# Patient Record
Sex: Female | Born: 1935
Health system: Southern US, Community
[De-identification: ages and names within clinical notes are randomized; demographics above are authoritative.]

## PROBLEM LIST (undated history)

## (undated) DIAGNOSIS — F419 Anxiety disorder, unspecified: Secondary | ICD-10-CM

## (undated) DIAGNOSIS — M1990 Unspecified osteoarthritis, unspecified site: Secondary | ICD-10-CM

## (undated) DIAGNOSIS — J329 Chronic sinusitis, unspecified: Secondary | ICD-10-CM

## (undated) DIAGNOSIS — A0472 Enterocolitis due to Clostridium difficile, not specified as recurrent: Secondary | ICD-10-CM

## (undated) DIAGNOSIS — E785 Hyperlipidemia, unspecified: Secondary | ICD-10-CM

## (undated) DIAGNOSIS — B3781 Candidal esophagitis: Secondary | ICD-10-CM

## (undated) DIAGNOSIS — F329 Major depressive disorder, single episode, unspecified: Secondary | ICD-10-CM

## (undated) DIAGNOSIS — M48061 Spinal stenosis, lumbar region without neurogenic claudication: Secondary | ICD-10-CM

## (undated) DIAGNOSIS — F32A Depression, unspecified: Secondary | ICD-10-CM

## (undated) DIAGNOSIS — K219 Gastro-esophageal reflux disease without esophagitis: Secondary | ICD-10-CM

## (undated) DIAGNOSIS — I10 Essential (primary) hypertension: Secondary | ICD-10-CM

## (undated) DIAGNOSIS — M8580 Other specified disorders of bone density and structure, unspecified site: Secondary | ICD-10-CM

## (undated) DIAGNOSIS — M797 Fibromyalgia: Secondary | ICD-10-CM

## (undated) DIAGNOSIS — D126 Benign neoplasm of colon, unspecified: Secondary | ICD-10-CM

## (undated) DIAGNOSIS — M109 Gout, unspecified: Secondary | ICD-10-CM

## (undated) DIAGNOSIS — S62102A Fracture of unspecified carpal bone, left wrist, initial encounter for closed fracture: Secondary | ICD-10-CM

## (undated) DIAGNOSIS — K5790 Diverticulosis of intestine, part unspecified, without perforation or abscess without bleeding: Secondary | ICD-10-CM

## (undated) DIAGNOSIS — F410 Panic disorder [episodic paroxysmal anxiety] without agoraphobia: Secondary | ICD-10-CM

## (undated) DIAGNOSIS — R600 Localized edema: Secondary | ICD-10-CM

## (undated) DIAGNOSIS — N19 Unspecified kidney failure: Secondary | ICD-10-CM

## (undated) HISTORY — DX: Enterocolitis due to Clostridium difficile, not specified as recurrent: A04.72

## (undated) HISTORY — DX: Major depressive disorder, single episode, unspecified: F32.9

## (undated) HISTORY — DX: Fracture of unspecified carpal bone, left wrist, initial encounter for closed fracture: S62.102A

## (undated) HISTORY — PX: NASAL SINUS SURGERY: SHX719

## (undated) HISTORY — DX: Gout, unspecified: M10.9

## (undated) HISTORY — PX: OTHER SURGICAL HISTORY: SHX169

## (undated) HISTORY — DX: Unspecified osteoarthritis, unspecified site: M19.90

## (undated) HISTORY — PX: CHOANAL ADENIODECTOMY: SHX1340

## (undated) HISTORY — DX: Gastro-esophageal reflux disease without esophagitis: K21.9

## (undated) HISTORY — PX: TOTAL ABDOMINAL HYSTERECTOMY: SHX209

## (undated) HISTORY — PX: TONSILLECTOMY AND ADENOIDECTOMY: SUR1326

## (undated) HISTORY — PX: ADENOIDECTOMY: SUR15

## (undated) HISTORY — DX: Candidal esophagitis: B37.81

## (undated) HISTORY — PX: SHOULDER SURGERY: SHX246

## (undated) HISTORY — DX: Chronic sinusitis, unspecified: J32.9

## (undated) HISTORY — DX: Spinal stenosis, lumbar region without neurogenic claudication: M48.061

## (undated) HISTORY — DX: Depression, unspecified: F32.A

## (undated) HISTORY — PX: CARPAL TUNNEL RELEASE: SHX101

## (undated) HISTORY — DX: Unspecified kidney failure: N19

## (undated) HISTORY — PX: KNEE ARTHROSCOPY: SUR90

## (undated) HISTORY — DX: Diverticulosis of intestine, part unspecified, without perforation or abscess without bleeding: K57.90

## (undated) HISTORY — DX: Fibromyalgia: M79.7

## (undated) HISTORY — PX: CATARACT EXTRACTION, BILATERAL: SHX1313

## (undated) HISTORY — DX: Panic disorder (episodic paroxysmal anxiety): F41.0

## (undated) HISTORY — DX: Hyperlipidemia, unspecified: E78.5

## (undated) HISTORY — DX: Benign neoplasm of colon, unspecified: D12.6

## (undated) HISTORY — DX: Essential (primary) hypertension: I10

## (undated) HISTORY — DX: Localized edema: R60.0

---

## 1997-09-16 ENCOUNTER — Ambulatory Visit (HOSPITAL_COMMUNITY): Admission: RE | Admit: 1997-09-16 | Discharge: 1997-09-16 | Payer: Self-pay | Admitting: *Deleted

## 1997-10-03 ENCOUNTER — Ambulatory Visit (HOSPITAL_COMMUNITY): Admission: RE | Admit: 1997-10-03 | Discharge: 1997-10-03 | Payer: Self-pay | Admitting: *Deleted

## 1998-01-30 ENCOUNTER — Encounter: Payer: Self-pay | Admitting: Internal Medicine

## 1998-06-11 ENCOUNTER — Ambulatory Visit (HOSPITAL_COMMUNITY): Admission: RE | Admit: 1998-06-11 | Discharge: 1998-06-11 | Payer: Self-pay | Admitting: *Deleted

## 1998-06-29 ENCOUNTER — Ambulatory Visit (HOSPITAL_COMMUNITY): Admission: RE | Admit: 1998-06-29 | Discharge: 1998-06-29 | Payer: Self-pay | Admitting: *Deleted

## 1998-10-27 ENCOUNTER — Emergency Department (HOSPITAL_COMMUNITY): Admission: EM | Admit: 1998-10-27 | Discharge: 1998-10-28 | Payer: Self-pay | Admitting: Emergency Medicine

## 1998-10-27 ENCOUNTER — Encounter: Payer: Self-pay | Admitting: Emergency Medicine

## 1998-10-28 ENCOUNTER — Encounter: Payer: Self-pay | Admitting: Emergency Medicine

## 1999-05-18 ENCOUNTER — Encounter: Payer: Self-pay | Admitting: Otolaryngology

## 1999-05-19 ENCOUNTER — Encounter (INDEPENDENT_AMBULATORY_CARE_PROVIDER_SITE_OTHER): Payer: Self-pay | Admitting: Specialist

## 1999-05-19 ENCOUNTER — Ambulatory Visit (HOSPITAL_COMMUNITY): Admission: RE | Admit: 1999-05-19 | Discharge: 1999-05-19 | Payer: Self-pay | Admitting: Otolaryngology

## 1999-08-06 ENCOUNTER — Encounter: Payer: Self-pay | Admitting: Family Medicine

## 1999-08-06 ENCOUNTER — Ambulatory Visit (HOSPITAL_COMMUNITY): Admission: RE | Admit: 1999-08-06 | Discharge: 1999-08-06 | Payer: Self-pay | Admitting: Family Medicine

## 1999-11-08 ENCOUNTER — Ambulatory Visit (HOSPITAL_COMMUNITY): Admission: RE | Admit: 1999-11-08 | Discharge: 1999-11-08 | Payer: Self-pay | Admitting: Family Medicine

## 1999-12-17 ENCOUNTER — Encounter: Payer: Self-pay | Admitting: Pulmonary Disease

## 1999-12-17 ENCOUNTER — Ambulatory Visit (HOSPITAL_COMMUNITY): Admission: RE | Admit: 1999-12-17 | Discharge: 1999-12-17 | Payer: Self-pay | Admitting: Pulmonary Disease

## 2000-06-23 ENCOUNTER — Encounter: Admission: RE | Admit: 2000-06-23 | Discharge: 2000-06-23 | Payer: Self-pay | Admitting: Otolaryngology

## 2000-06-23 ENCOUNTER — Encounter: Payer: Self-pay | Admitting: Otolaryngology

## 2000-08-01 HISTORY — PX: TOTAL SHOULDER REPLACEMENT: SUR1217

## 2000-08-08 ENCOUNTER — Other Ambulatory Visit: Admission: RE | Admit: 2000-08-08 | Discharge: 2000-08-08 | Payer: Self-pay | Admitting: *Deleted

## 2000-08-10 ENCOUNTER — Encounter: Payer: Self-pay | Admitting: Otolaryngology

## 2000-08-10 ENCOUNTER — Encounter: Admission: RE | Admit: 2000-08-10 | Discharge: 2000-08-10 | Payer: Self-pay | Admitting: Otolaryngology

## 2000-08-11 ENCOUNTER — Ambulatory Visit (HOSPITAL_BASED_OUTPATIENT_CLINIC_OR_DEPARTMENT_OTHER): Admission: RE | Admit: 2000-08-11 | Discharge: 2000-08-11 | Payer: Self-pay | Admitting: Otolaryngology

## 2000-08-26 ENCOUNTER — Encounter: Payer: Self-pay | Admitting: Emergency Medicine

## 2000-08-26 ENCOUNTER — Emergency Department (HOSPITAL_COMMUNITY): Admission: EM | Admit: 2000-08-26 | Discharge: 2000-08-26 | Payer: Self-pay | Admitting: Internal Medicine

## 2000-09-27 ENCOUNTER — Encounter: Payer: Self-pay | Admitting: Family Medicine

## 2000-09-27 ENCOUNTER — Ambulatory Visit (HOSPITAL_COMMUNITY): Admission: RE | Admit: 2000-09-27 | Discharge: 2000-09-27 | Payer: Self-pay | Admitting: Family Medicine

## 2001-02-07 ENCOUNTER — Encounter: Admission: RE | Admit: 2001-02-07 | Discharge: 2001-05-08 | Payer: Self-pay | Admitting: Family Medicine

## 2001-05-18 ENCOUNTER — Encounter: Payer: Self-pay | Admitting: Emergency Medicine

## 2001-05-18 ENCOUNTER — Inpatient Hospital Stay (HOSPITAL_COMMUNITY): Admission: EM | Admit: 2001-05-18 | Discharge: 2001-05-20 | Payer: Self-pay | Admitting: Emergency Medicine

## 2001-05-18 ENCOUNTER — Encounter: Payer: Self-pay | Admitting: Specialist

## 2001-05-19 ENCOUNTER — Encounter: Payer: Self-pay | Admitting: Specialist

## 2001-08-20 ENCOUNTER — Encounter: Admission: RE | Admit: 2001-08-20 | Discharge: 2001-08-20 | Payer: Self-pay | Admitting: Otolaryngology

## 2001-08-20 ENCOUNTER — Encounter: Payer: Self-pay | Admitting: Otolaryngology

## 2001-12-18 ENCOUNTER — Encounter: Admission: RE | Admit: 2001-12-18 | Discharge: 2001-12-18 | Payer: Self-pay | Admitting: Specialist

## 2001-12-18 ENCOUNTER — Encounter: Payer: Self-pay | Admitting: Specialist

## 2002-04-26 ENCOUNTER — Encounter: Payer: Self-pay | Admitting: Family Medicine

## 2002-04-26 ENCOUNTER — Ambulatory Visit (HOSPITAL_COMMUNITY): Admission: RE | Admit: 2002-04-26 | Discharge: 2002-04-26 | Payer: Self-pay | Admitting: Family Medicine

## 2002-08-01 HISTORY — PX: TOTAL HIP ARTHROPLASTY: SHX124

## 2002-08-20 ENCOUNTER — Encounter: Payer: Self-pay | Admitting: Otolaryngology

## 2002-08-20 ENCOUNTER — Encounter: Admission: RE | Admit: 2002-08-20 | Discharge: 2002-08-20 | Payer: Self-pay | Admitting: Otolaryngology

## 2002-11-28 ENCOUNTER — Encounter: Admission: RE | Admit: 2002-11-28 | Discharge: 2003-01-10 | Payer: Self-pay | Admitting: Orthopaedic Surgery

## 2003-06-18 ENCOUNTER — Inpatient Hospital Stay (HOSPITAL_COMMUNITY): Admission: RE | Admit: 2003-06-18 | Discharge: 2003-06-23 | Payer: Self-pay | Admitting: Orthopedic Surgery

## 2004-04-30 ENCOUNTER — Ambulatory Visit (HOSPITAL_COMMUNITY): Admission: RE | Admit: 2004-04-30 | Discharge: 2004-04-30 | Payer: Self-pay | Admitting: Family Medicine

## 2004-05-27 ENCOUNTER — Encounter: Admission: RE | Admit: 2004-05-27 | Discharge: 2004-05-27 | Payer: Self-pay | Admitting: Orthopedic Surgery

## 2004-08-01 HISTORY — PX: TOTAL KNEE ARTHROPLASTY: SHX125

## 2004-08-10 ENCOUNTER — Ambulatory Visit: Payer: Self-pay | Admitting: Pulmonary Disease

## 2004-08-31 ENCOUNTER — Ambulatory Visit (HOSPITAL_COMMUNITY): Admission: RE | Admit: 2004-08-31 | Discharge: 2004-08-31 | Payer: Self-pay | Admitting: Pulmonary Disease

## 2004-08-31 ENCOUNTER — Ambulatory Visit: Payer: Self-pay | Admitting: Pulmonary Disease

## 2004-09-29 ENCOUNTER — Ambulatory Visit: Payer: Self-pay | Admitting: Pulmonary Disease

## 2004-10-28 ENCOUNTER — Ambulatory Visit: Payer: Self-pay | Admitting: Pulmonary Disease

## 2004-12-13 ENCOUNTER — Ambulatory Visit: Payer: Self-pay | Admitting: Pulmonary Disease

## 2005-01-07 ENCOUNTER — Ambulatory Visit: Payer: Self-pay | Admitting: Pulmonary Disease

## 2005-01-11 ENCOUNTER — Inpatient Hospital Stay (HOSPITAL_COMMUNITY): Admission: RE | Admit: 2005-01-11 | Discharge: 2005-01-15 | Payer: Self-pay | Admitting: Orthopedic Surgery

## 2005-08-30 ENCOUNTER — Encounter: Admission: RE | Admit: 2005-08-30 | Discharge: 2005-08-30 | Payer: Self-pay | Admitting: Orthopedic Surgery

## 2005-11-07 ENCOUNTER — Ambulatory Visit (HOSPITAL_BASED_OUTPATIENT_CLINIC_OR_DEPARTMENT_OTHER): Admission: RE | Admit: 2005-11-07 | Discharge: 2005-11-07 | Payer: Self-pay | Admitting: Orthopedic Surgery

## 2006-01-05 ENCOUNTER — Emergency Department (HOSPITAL_COMMUNITY): Admission: EM | Admit: 2006-01-05 | Discharge: 2006-01-06 | Payer: Self-pay | Admitting: Emergency Medicine

## 2006-03-07 ENCOUNTER — Ambulatory Visit (HOSPITAL_BASED_OUTPATIENT_CLINIC_OR_DEPARTMENT_OTHER): Admission: RE | Admit: 2006-03-07 | Discharge: 2006-03-07 | Payer: Self-pay | Admitting: Orthopedic Surgery

## 2006-03-14 ENCOUNTER — Emergency Department (HOSPITAL_COMMUNITY): Admission: EM | Admit: 2006-03-14 | Discharge: 2006-03-14 | Payer: Self-pay | Admitting: Emergency Medicine

## 2006-04-04 ENCOUNTER — Ambulatory Visit (HOSPITAL_COMMUNITY): Admission: RE | Admit: 2006-04-04 | Discharge: 2006-04-04 | Payer: Self-pay | Admitting: Family Medicine

## 2006-04-21 ENCOUNTER — Ambulatory Visit: Payer: Self-pay | Admitting: Internal Medicine

## 2006-05-11 ENCOUNTER — Ambulatory Visit: Payer: Self-pay | Admitting: Internal Medicine

## 2006-11-03 ENCOUNTER — Encounter: Admission: RE | Admit: 2006-11-03 | Discharge: 2006-11-03 | Payer: Self-pay | Admitting: Otolaryngology

## 2007-05-31 ENCOUNTER — Ambulatory Visit (HOSPITAL_COMMUNITY): Admission: RE | Admit: 2007-05-31 | Discharge: 2007-05-31 | Payer: Self-pay | Admitting: Family Medicine

## 2007-08-09 ENCOUNTER — Ambulatory Visit (HOSPITAL_BASED_OUTPATIENT_CLINIC_OR_DEPARTMENT_OTHER): Admission: RE | Admit: 2007-08-09 | Discharge: 2007-08-09 | Payer: Self-pay | Admitting: Orthopedic Surgery

## 2007-11-19 ENCOUNTER — Ambulatory Visit (HOSPITAL_COMMUNITY): Admission: RE | Admit: 2007-11-19 | Discharge: 2007-11-19 | Payer: Self-pay | Admitting: Family Medicine

## 2008-11-21 ENCOUNTER — Ambulatory Visit (HOSPITAL_COMMUNITY): Admission: RE | Admit: 2008-11-21 | Discharge: 2008-11-21 | Payer: Self-pay | Admitting: Family Medicine

## 2009-03-30 ENCOUNTER — Ambulatory Visit (HOSPITAL_COMMUNITY): Admission: RE | Admit: 2009-03-30 | Discharge: 2009-03-30 | Payer: Self-pay | Admitting: Family Medicine

## 2009-04-07 ENCOUNTER — Encounter: Admission: RE | Admit: 2009-04-07 | Discharge: 2009-04-07 | Payer: Self-pay | Admitting: Family Medicine

## 2009-12-02 ENCOUNTER — Ambulatory Visit (HOSPITAL_COMMUNITY): Admission: RE | Admit: 2009-12-02 | Discharge: 2009-12-02 | Payer: Self-pay | Admitting: Family Medicine

## 2010-03-17 ENCOUNTER — Encounter: Admission: RE | Admit: 2010-03-17 | Discharge: 2010-03-17 | Payer: Self-pay | Admitting: Orthopedic Surgery

## 2010-07-30 ENCOUNTER — Inpatient Hospital Stay (HOSPITAL_COMMUNITY)
Admission: RE | Admit: 2010-07-30 | Discharge: 2010-08-01 | Payer: Self-pay | Source: Home / Self Care | Attending: Orthopedic Surgery | Admitting: Orthopedic Surgery

## 2010-08-22 ENCOUNTER — Encounter: Payer: Self-pay | Admitting: Internal Medicine

## 2010-09-14 NOTE — Discharge Summary (Signed)
  [name], [name]              ACCOUNT NO.:  [id number]  MEDICAL RECORD NO.:  [id number]          PATIENT TYPE:  INP  LOCATION:  5024                         FACILITY:  [facility]  PHYSICIAN:  Almedia Balls. Ranell Patrick, M.D. DATE OF BIRTH:  1932-12-27  DATE OF ADMISSION:  07/30/2010 DATE OF DISCHARGE:  08/01/2010                              DISCHARGE SUMMARY   ADMISSION DIAGNOSIS:  Right shoulder painful hemiarthroplasty.  DISCHARGE DIAGNOSIS:  Right shoulder painful hemiarthroplasty status post reverse total shoulder arthroplasty.  BRIEF HISTORY:  The patient is a 77 year old female with worsening right shoulder pain secondary to a painful hemiarthroplasty along with a rotator cuff deficiency.  The patient was admitted for surgical management to decrease pain and increase function.  PROCEDURE:  The patient had a right total shoulder arthroplasty reverse revision by Dr. Malon Kindle on July 31, 2010.  Assistant was [employer], PA-C.  General anesthesia was used.  No complications.  HOSPITAL COURSE:  The patient was admitted on July 31, 2010 for the above-stated procedure which she tolerated well.  After adequate time in Postanesthesia Care Unit, she was transferred up to 5000 without any difficulty.  Postop day #1, the patient complained of moderate pain in that right shoulder but was able to work with physical therapy and occupational therapy.  Postop day #2, she continued to improve with physical therapy.  The patient will be discharged home once she is stable with physical therapy.  She was afebrile.  Labs were in acceptable limits and neurologically she was intact.  Wound was healing well.  DISCHARGE PLAN:  The patient will be discharged home on August 02, 2009. Her condition is stable.  Her diet is regular.  DISCHARGE MEDICATIONS: 1. Robaxin 500 mg p.o. q.6 h. 2. Percocet 5/325 one to two tablets q.4-6 p.r.n. pain.  FOLLOWUP:  The patient will follow back up with  Dr. Malon Kindle in 2 weeks.    Thomas B. Dixon, P.A.   ______________________________ Almedia Balls. Ranell Patrick, M.D.   TBD/MEDQ  D:  08/24/2010  T:  08/24/2010  Job:  [id number]  Electronically Signed by Standley Dakins P.A. on 08/26/2010 09:38:13 AM Electronically Signed by Malon Kindle  on 09/14/2010 11:21:07 AM

## 2010-09-24 ENCOUNTER — Telehealth: Payer: Self-pay | Admitting: Internal Medicine

## 2010-09-24 DIAGNOSIS — K219 Gastro-esophageal reflux disease without esophagitis: Secondary | ICD-10-CM | POA: Insufficient documentation

## 2010-09-24 DIAGNOSIS — R197 Diarrhea, unspecified: Secondary | ICD-10-CM | POA: Insufficient documentation

## 2010-09-24 DIAGNOSIS — R05 Cough: Secondary | ICD-10-CM | POA: Insufficient documentation

## 2010-09-27 ENCOUNTER — Encounter: Payer: Self-pay | Admitting: Internal Medicine

## 2010-09-27 ENCOUNTER — Other Ambulatory Visit: Payer: Medicare Other

## 2010-09-27 ENCOUNTER — Other Ambulatory Visit: Payer: Self-pay | Admitting: Internal Medicine

## 2010-09-27 ENCOUNTER — Ambulatory Visit (INDEPENDENT_AMBULATORY_CARE_PROVIDER_SITE_OTHER): Payer: Medicare Other | Admitting: Internal Medicine

## 2010-09-27 DIAGNOSIS — K5289 Other specified noninfective gastroenteritis and colitis: Secondary | ICD-10-CM

## 2010-09-27 DIAGNOSIS — K625 Hemorrhage of anus and rectum: Secondary | ICD-10-CM

## 2010-09-27 LAB — CONVERTED CEMR LAB: Tissue Transglutaminase Ab, IgA: 4.4 units (ref <20)

## 2010-09-27 LAB — RENAL FUNCTION PANEL
Albumin: 3.9 g/dL (ref 3.5–5.2)
BUN: 9 mg/dL (ref 6–23)
CO2: 32 mEq/L (ref 19–32)
Calcium: 9.7 mg/dL (ref 8.4–10.5)
Chloride: 94 mEq/L — ABNORMAL LOW (ref 96–112)
Creatinine, Ser: 1 mg/dL (ref 0.4–1.2)
GFR: 58.05 mL/min — ABNORMAL LOW (ref >60.00)
Glucose, Bld: 101 mg/dL — ABNORMAL HIGH (ref 70–99)
Phosphorus: 3.1 mg/dL (ref 2.3–4.6)
Potassium: 3.2 mEq/L — ABNORMAL LOW (ref 3.5–5.1)
Sodium: 137 mEq/L (ref 135–145)

## 2010-09-27 LAB — CBC WITH DIFFERENTIAL/PLATELET
Basophils Absolute: 0 10*3/uL (ref 0.0–0.1)
Basophils Relative: 0.3 % (ref 0.0–3.0)
Eosinophils Absolute: 0.2 10*3/uL (ref 0.0–0.7)
Eosinophils Relative: 1.3 % (ref 0.0–5.0)
HCT: 42.9 % (ref 36.0–46.0)
Hemoglobin: 14.5 g/dL (ref 12.0–15.0)
Lymphocytes Relative: 30.9 % (ref 12.0–46.0)
Lymphs Abs: 3.6 10*3/uL (ref 0.7–4.0)
MCHC: 33.8 g/dL (ref 30.0–36.0)
MCV: 93.9 fl (ref 78.0–100.0)
Monocytes Absolute: 0.6 10*3/uL (ref 0.1–1.0)
Monocytes Relative: 5.6 % (ref 3.0–12.0)
Neutro Abs: 7.2 10*3/uL (ref 1.4–7.7)
Neutrophils Relative %: 61.9 % (ref 43.0–77.0)
Platelets: 299 10*3/uL (ref 150.0–400.0)
RBC: 4.56 Mil/uL (ref 3.87–5.11)
RDW: 14.1 % (ref 11.5–14.6)
WBC: 11.6 10*3/uL — ABNORMAL HIGH (ref 4.5–10.5)

## 2010-09-27 LAB — TSH: TSH: 3.45 u[IU]/mL (ref 0.35–5.50)

## 2010-09-28 ENCOUNTER — Encounter: Payer: Self-pay | Admitting: Internal Medicine

## 2010-09-28 LAB — SEDIMENTATION RATE: Sed Rate: 16 mm/hr (ref 0–22)

## 2010-09-28 NOTE — Progress Notes (Signed)
Summary: Triage  Phone Note Call from Patient Call back at Home Phone [phone]   Caller: Patient Call For: Dr. Juanda Chance Reason for Call: Talk to Nurse Summary of Call: "bloody Diarrhea", nausea....requesting sooner appt. than next avail. Initial call taken by: Karna Christmas,  September 24, 2010 11:34 AM  Follow-up for Phone Call        Patient calling to schedule an appointment with Dr. Juanda Chance as per her PCP. She states she started having problems with rectal bleeding abut 2-3 months ago and she saw Dr. Hyacinth Meeker who told her it was hemorrhoids. 3-4 weeks ago, she had an episode of severe diarrhea with bleeding. She saw Dr. Zachery Dauer, who had her start on a BRAT diet. She got better until last week when she had another episode of diarrhea with bright red blood in stool. During the episode she had severe abdominal cramping. The diarrhea causes her rectal pain. She saw Dr. Uvaldo Rising at this time and per patient Dr. Uvaldo Rising spoke with Dr. Juanda Chance and patient was treated with antibiotics for "ischemic diverticulosis". She has completed the antibiotics 2 days ago. She is not having any diarrhea  at this time. Patient states she had her first bowel movement in 3-4 days which was formed this AM and she did see some bright red blood with the stool and on the tissue when she wiped.  Denies any abdominal pain or cramping at this time. Patient scheduled to see Dr. Juanda Chance on 09/27/10 at 3:00 PM. Patient is aware that if she has any black stools or maroon colored bleeding this weekend she is to go the ER.  Follow-up by: Jesse Fall RN,  September 24, 2010 12:06 PM  Additional Follow-up for Phone Call Additional follow up Details #1::        Above noted. Can workin with an extender if she needs to be seen sooner. Additional Follow-up by: Meryl Dare MD Clementeen Graham,  September 24, 2010 1:52 PM    Additional Follow-up for Phone Call Additional follow up Details #2::    reviewed and agree. Follow-up by: Hart Carwin  MD,  September 24, 2010 11:18 PM

## 2010-09-28 NOTE — Letter (Signed)
Summary: Gastroenterology  Gastroenterology   Imported By: Lamona Curl CMA (AAMA) 09/24/2010 16:44:33  _____________________________________________________________________  External Attachment:    Type:   Image     Comment:   External Document

## 2010-09-28 NOTE — Procedures (Signed)
Summary: Flex Sig  Patient Name: Carrie, [name] MRN:  Procedure Procedures: Flexible Proctosigmoidoscopy CPT: [phone].  Personnel: Endoscopist: Eitan Doubleday L. Juanda Chance, MD.  Exam Location: Exam performed in Outpatient Clinic. Outpatient  Patient Consent: Procedure, Alternatives, Risks and Benefits discussed, consent obtained, from patient.  Indications  Average Risk Screening Routine.  History  Current Medications: Patient is not currently taking Coumadin.  Pre-Exam Physical: Performed May 11, 2006. Entire physical exam was normal.  Comments: Pt. history reviewed/updated, physical exam performed prior to sedation?yes Exam Exam: Extent visualized: Splenic Flexure. Extent of exam: 80 cm. Colon retroflexion performed. Images taken. ASA Classification: II. Tolerance: good.  Colon Prep Used Mag Citrate for colon prep. Prep: fair, adequate exam.  Sedation Meds: Fentanyl 50 mcg. given IV. Versed 3.5 mg. given IV.  Findings - DIVERTICULOSIS: Sigmoid Colon. ICD9: Diverticulosis: 562.10. Comments: moderaterly severe diverticulosis of the sigmoid colon.  - NORMAL EXAM: Rectum.   Assessment Abnormal examination, see findings above.  Diagnoses: 562.10: Diverticulosis.   Comments: modrately severer diverticulosis of the sigmoid colon Events  Unplanned Intervention: No intervention was required.  Unplanned Events: There were no complications. Plans Patient Education: Patient given standard instructions for: Patient instructed to get routine colon every 5 years.  Comments: Barium enema  to vis the right colon Disposition: After procedure patient sent to recovery. After recovery patient sent home.   cc: Sigmund Hazel, MD This report was created from the original endoscopy report, which was reviewed and signed by the above listed endoscopist.

## 2010-09-28 NOTE — Progress Notes (Signed)
Summary: Office Visit  Office Visit   Imported By: Lamona Curl CMA (AAMA) 09/24/2010 16:42:07  _____________________________________________________________________  External Attachment:    Type:   Image     Comment:   External Document

## 2010-10-05 ENCOUNTER — Other Ambulatory Visit: Payer: Self-pay | Admitting: Internal Medicine

## 2010-10-05 ENCOUNTER — Other Ambulatory Visit (AMBULATORY_SURGERY_CENTER): Payer: Medicare Other | Admitting: Internal Medicine

## 2010-10-05 DIAGNOSIS — K5730 Diverticulosis of large intestine without perforation or abscess without bleeding: Secondary | ICD-10-CM

## 2010-10-05 DIAGNOSIS — K5289 Other specified noninfective gastroenteritis and colitis: Secondary | ICD-10-CM

## 2010-10-05 DIAGNOSIS — D126 Benign neoplasm of colon, unspecified: Secondary | ICD-10-CM

## 2010-10-07 NOTE — Letter (Signed)
Summary: [facility] Instructions  [facility]  [address] [place], [place] [address]   Phone: [phone]  Fax: [phone]       [name]    [birth year]    MRN: [id number]        Procedure Day /Date: Tuesday 10/05/10     Arrival Time: 12:30 pm     Procedure Time: 1:30 pm     Location of Procedure:                    _x _  [facility] (4th Floor)  PREPARATION FOR COLONOSCOPY WITH MOVIPREP   Starting 5 days prior to your procedure 09/30/10 do not eat nuts, seeds, popcorn, corn, beans, peas,  salads, or any raw vegetables.  Do not take any fiber supplements (e.g. Metamucil, Citrucel, and Benefiber).  THE DAY BEFORE YOUR PROCEDURE         DATE: 10/04/10 DAY: Wednesday  1.  Drink clear liquids the entire day-NO SOLID FOOD  2.  Do not drink anything colored red or purple.  Avoid juices with pulp.  No orange juice.  3.  Drink at least 64 oz. (8 glasses) of fluid/clear liquids during the day to prevent dehydration and help the prep work efficiently.  CLEAR LIQUIDS INCLUDE: Water Jello Ice Popsicles Tea (sugar ok, no milk/cream) Powdered fruit flavored drinks Coffee (sugar ok, no milk/cream) Gatorade Juice: apple, white grape, white cranberry  Lemonade Clear bullion, consomm, broth Carbonated beverages (any kind) Strained chicken noodle soup Hard Candy                             4.  In the morning, mix first dose of MoviPrep solution:    Empty 1 Pouch A and 1 Pouch B into the disposable container    Add lukewarm drinking water to the top line of the container. Mix to dissolve    Refrigerate (mixed solution should be used within 24 hrs)  5.  Begin drinking the prep at 5:00 p.m. The MoviPrep container is divided by 4 marks.   Every 15 minutes drink the solution down to the next mark (approximately 8 oz) until the full liter is complete.   6.  Follow completed prep with 16 oz of clear liquid of your choice (Nothing red or purple).   Continue to drink clear liquids until bedtime.  7.  Before going to bed, mix second dose of MoviPrep solution:    Empty 1 Pouch A and 1 Pouch B into the disposable container    Add lukewarm drinking water to the top line of the container. Mix to dissolve    Refrigerate  THE DAY OF YOUR PROCEDURE      DATE: 10/05/10 DAY: Thursday  Beginning at 8:30 a.m. (5 hours before procedure):         1. Every 15 minutes, drink the solution down to the next mark (approx 8 oz) until the full liter is complete.  2. Follow completed prep with 16 oz. of clear liquid of your choice.    3. You may drink clear liquids until 11:30 am (2 HOURS BEFORE PROCEDURE).   MEDICATION INSTRUCTIONS  Unless otherwise instructed, you should take regular prescription medications with a small sip of water   as early as possible the morning of your procedure.         OTHER INSTRUCTIONS  You will need a responsible adult at least 75 years  of age to accompany you and drive you home.   This person must remain in the waiting room during your procedure.  Wear loose fitting clothing that is easily removed.  Leave jewelry and other valuables at home.  However, you may wish to bring a book to read or  an iPod/MP3 player to listen to music as you wait for your procedure to start.  Remove all body piercing jewelry and leave at home.  Total time from sign-in until discharge is approximately 2-3 hours.  You should go home directly after your procedure and rest.  You can resume normal activities the  day after your procedure.  The day of your procedure you should not:   Drive   Make legal decisions   Operate machinery   Drink alcohol   Return to work  You will receive specific instructions about eating, activities and medications before you leave.    mrn [id number]  The above instructions have been reviewed and explained to me by   _______________________    I fully understand and can verbalize these  instructions _____________________________ Date _________

## 2010-10-07 NOTE — Assessment & Plan Note (Signed)
Summary: diarrhea, rectal bleeding, no show,copay/Regina   History of Present Illness Visit Type: Initial Consult Primary GI MD: Lina Sar MD Primary Provider: Juluis Rainier, MD Requesting Provider: Juluis Rainier, MD Chief Complaint: ulcerative colitis, rectal bleeding, diarrhea has stopped for now  History of Present Illness:   This is a 77 year old white female with episodes of severe abdominal pain which occurred 6 weeks ago while eating out in a restaurant. She was discharged from the hospital several days prior to that after having  right shoulder replacement. She was still on pain medications at the time. The episode was rather severe and resulted in a crampy abdominal pain, diarrhea and ultimately bleeding with blood being mixed with stool. It took her several days to recover and then she had another episode 2 weeks later with crampy abdominal pain and bleeding. She finally improved enough last week to eat regular food. She is on 3 blood pressure medications including atenolol, HCTZ and Norvasc. Norvasc dose has been decreased from 20 to 10 mg a day by Dr Chari Manning. She was seen previously for gastroesophageal reflux disease and had an upper GI series in 1999 which showed a small hiatal hernia. She has a history of Candida esophagitis related to steroids used for asthmatic bronchitis. A flexible sigmoidoscopy on October 2007 showed moderately severe diverticulosis of the left colon.   GI Review of Systems    Reports bloating.      Denies abdominal pain, acid reflux, belching, chest pain, dysphagia with liquids, dysphagia with solids, heartburn, loss of appetite, nausea, vomiting, vomiting blood, weight loss, and  weight gain.      Reports diarrhea, diverticulosis, hemorrhoids, irritable bowel syndrome, and  rectal bleeding.     Denies anal fissure, black tarry stools, change in bowel habit, constipation, fecal incontinence, heme positive stool, jaundice, light color stool, liver  problems, and  rectal pain. Preventive Screening-Counseling & Management  Alcohol-Tobacco     Smoking Status: never    Current Medications (verified): 1)  Atenolol 25 Mg Tabs (Atenolol) .... Take 1 Tablet By Mouth Once Daily 2)  Hydrochlorothiazide 25 Mg Tabs (Hydrochlorothiazide) .... Take 1 Tablet By Mouth Once Daily 3)  Amlodipine Besylate 10 Mg Tabs (Amlodipine Besylate) .... Take 1/2 Tablet  Every Morning 4)  Proventil Hfa 108 (90 Base) Mcg/act Aers (Albuterol Sulfate) .... As Needed 5)  Vesicare 5 Mg Tabs (Solifenacin Succinate) .... Take 1 Tablet By Mouth Once Daily 6)  Tramadol Hcl 50 Mg Tabs (Tramadol Hcl) .... Take 1 Tablet Every 6 Hours As Needed For Pain 7)  Robaxin 500 Mg Tabs (Methocarbamol) .... Take 1 Tablet Every 6 Hours As Needed 8)  Reclast 5 Mg/141ml Soln (Zoledronic Acid) .[name].. Intravenous Once Yearly 9)  Xanax 0.5 Mg Tabs (Alprazolam) .... Take 1 Tablet Up To 3 Times Daily As Needed 10)  Calcium 600 + D 600-400mg  Ui .... Take 2 Tablet By Mouth Once Daily 11)  Allopurinol 300 Mg Tabs (Allopurinol) .... Take 1 Tablet By Mouth Once Daily 12)  Simvastatin 20 Mg Tabs (Simvastatin) .... Take 1 Tablet By Mouth Once Daily 13)  Epipen 2-Pak 0.3 Mg/0.75ml Devi (Epinephrine) .... As Needed 14)  Cymbalta 30 Mg Cpep (Duloxetine Hcl) .... Take 90 Mg By Mouth Once Daily 15)  Clobetasol Propionate 0.05 % Crea (Clobetasol Propionate) .... Apply Infected Area Two Times A Day 16)  Prilosec 20 Mg Cpdr (Omeprazole) .... Take 1 Cap By Mouth Once Daily  Allergies: 1)  ! Cipro 2)  ! Sulfa  3)  ! * Pseudoephedrine 4)  ! Compazine 5)  ! Diovan 6)  ! Morphine 7)  ! * Lamictal 8)  ! Neurontin 9)  ! Percocet 10)  ! * Remeron 11)  ! * Lyrica 12)  ! * Ephadrene 13)  ! * Ketek 14)  ! Verapamil 15)  ! * Bee Stings  Past History:  Past Medical History: Current Problems:  BLOOD IN STOOL (ICD-578.1) DIARRHEA (ICD-787.91) DIVERTICULOSIS, COLON (ICD-562.10) COUGH, CHRONIC  (ICD-786.2) UNSPECIFIED SINUSITIS (ICD-473.9) GERD (ICD-530.81)   Anxiety Disorder Arthritis Asthma Chronic Headaches Depression Fibromyalgia GERD Hyperlipidemia Hypertension  Past Surgical History: Reviewed history from 09/24/2010 and no changes required. Hysterectomy Tonsillectomy Ganglion Cyst Removal Right shoulder revision, reverse total shoulder arthroplasty.  Bilateral Carpal Tunnel Repair Right Total Knee Replacement Right Hip Replacement Sinus Surgery     Family History: Reviewed history from 09/24/2010 and no changes required. Family History of Diabetes: Mother Family History of Breast Cancer: Family History of Heart Disease: Mother Father-Bladder Cancer  Social History: Illicit Drug Use - no Occupation: Retired Patient has never smoked.  Alcohol Use - no Daily Caffeine Use Smoking Status:  never  Review of Systems       The patient complains of allergy/sinus, anxiety-new, arthritis/joint pain, back pain, cough, depression-new, fatigue, itching, muscle pains/cramps, and swelling of feet/legs.  The patient denies anemia, blood in urine, breast changes/lumps, change in vision, confusion, coughing up blood, fainting, fever, headaches-new, hearing problems, heart murmur, heart rhythm changes, menstrual pain, night sweats, nosebleeds, pregnancy symptoms, shortness of breath, skin rash, sleeping problems, sore throat, swollen lymph glands, thirst - excessive , urination - excessive , urination changes/pain, urine leakage, vision changes, and voice change.         Pertinent positive and negative review of systems were noted in the above HPI. All other ROS was otherwise negative.   Vital Signs:  Patient profile:   77 year old female Height:      61 inches Weight:      158.13 pounds BMI:     29.99 Pulse rate:   76 / minute Pulse rhythm:   regular BP sitting:   102 / 60  (left arm) Cuff size:   regular  Vitals Entered By: June McMurray CMA Duncan Dull) (September 27, 2010 3:09 PM)  Physical Exam  General:  Well developed, well nourished, no acute distress. Eyes:  PERRLA, no icterus. Mouth:  No deformity or lesions, dentition normal. Neck:  Supple; no masses or thyromegaly. Lungs:  Clear throughout to auscultation. Heart:  Regular rate and rhythm; no murmurs, rubs,  or bruits. Abdomen:  soft relaxed abdomen with normoactive bowel sounds, topical so the colon. No particular tenderness. No mass. No bruit. Rectal:  anoscopic exam reveals no significant hemorrhoids. Stool is soft Hemoccult-negative. There were no external hemorrhoids or fissure. There was no evidence of proctitis. Extremities:  No clubbing, cyanosis, edema or deformities noted. Skin:  Intact without significant lesions or rashes. Psych:  Alert and cooperative. Normal mood and affect.   Impression & Recommendations:  Problem # 1:  BLOOD IN STOOL (ICD-578.1) Patient has had 2 discrete episodes of crampy abdominal pain, diarrhea and rectal bleeding suggestive of ischemic colitis. I cannot find any evidence of rectal bleeding on anoscopic exam today. Therefore, I feel this will need to be investigated further. I have asked her to stop HCTZ and continue only on Norvasc and atenolol. Her blood pressure today was 102/64. I have asked her to monitor her blood pressure and if  it gets below 90, she should hold her blood pressure medications.Colonoscopy will r/o IBD or cancer.  Problem # 2:  IBD (ICD-558.9) She gives a history of colitis but there is no evidence for inflammatory bowel disease at this time.  TLB-CBC Platelet - w/Differential (85025-CBCD) TLB-Renal Function Panel (80069-RENAL) TLB-TSH (Thyroid Stimulating Hormone) (84443-TSH) TLB-Sedimentation Rate (ESR) (85652-ESR) T-Tissue Transglutamase Ab IgA ([address]-98119)  Problem # 3:  DIARRHEA (ICD-787.91) Patient has had intermittent diarrhea in the past. We need to rule out sprue, or irritable bowel syndrome versus symptomatic  diverticulosis.  Other Orders: Colonoscopy (Colon)  Patient Instructions: 1)  You have been scheduled for a colonoscopy. Please follow written prep instructions that were given to you today at your visit.  2)  Please pick up your prescription for Moviprep at the pharmacy. An electronic presription has already been sent.  3)  stop hydrochlorothiazide 4)  Monitoring of blood pressure at home if systolic blood pressure less than 90 O2 blood pressure medications 5)  Your physician requests that you go to the basement floor of our office to have the following labwork completed before leaving today: TtG, CBC, Renal Profile, Sedimentation Rate, TSH. 6)  The medication list was reviewed and reconciled.  All changed / newly prescribed medications were explained.  A complete medication list was provided to the patient / caregiver. 7)  Copy sent to : Dr Chari Manning Prescriptions: MOVIPREP 100 GM  SOLR (PEG-KCL-NACL-NASULF-NA ASC-C) As per prep instructions.  #1 x 0   Entered by:   Lamona Curl CMA (AAMA)   Authorized by:   Hart Carwin MD   Signed by:   Lamona Curl CMA (AAMA) on 09/27/2010   Method used:   Electronically to        [facility]. [phone]* (retail)       [phone]       [place], [place]  [address]       Ph: [phone]       Fax: [phone]   RxID:   [id number]

## 2010-10-07 NOTE — Miscellaneous (Signed)
  Clinical Lists Changes  Medications: Added new medication of KLOR-CON M20 20 MEQ CR-TABS (POTASSIUM CHLORIDE CRYS CR) Take one by mouth two times a day x 5 days - Signed Rx of KLOR-CON M20 20 MEQ CR-TABS (POTASSIUM CHLORIDE CRYS CR) Take one by mouth two times a day x 5 days;  #10 x 0;  Signed;  Entered by: Jesse Fall RN;  Authorized by: Hart Carwin MD;  Method used: Electronically to [facility]. #[id number]*, [address] [place], [place], [place]  [address], Ph: [phone], Fax: [phone]    Prescriptions: KLOR-CON M20 20 MEQ CR-TABS (POTASSIUM CHLORIDE CRYS CR) Take one by mouth two times a day x 5 days  #10 x 0   Entered by:   Jesse Fall RN   Authorized by:   Hart Carwin MD   Signed by:   Jesse Fall RN on 09/28/2010   Method used:   Electronically to        [facility]. [phone]* (retail)       [phone] [address]       [place], [place]  [address]       Ph: [phone]       Fax: [phone]   RxID:   [id number]

## 2010-10-11 ENCOUNTER — Encounter: Payer: Self-pay | Admitting: Internal Medicine

## 2010-10-11 ENCOUNTER — Telehealth: Payer: Self-pay | Admitting: Internal Medicine

## 2010-10-11 LAB — URINALYSIS, ROUTINE W REFLEX MICROSCOPIC
Bilirubin Urine: NEGATIVE
Glucose, UA: NEGATIVE mg/dL
Hgb urine dipstick: NEGATIVE
Ketones, ur: NEGATIVE mg/dL
Nitrite: NEGATIVE
Protein, ur: NEGATIVE mg/dL
Specific Gravity, Urine: 1.017 (ref 1.005–1.030)
Urobilinogen, UA: 1 mg/dL (ref 0.0–1.0)
pH: 7.5 (ref 5.0–8.0)

## 2010-10-11 LAB — CBC
HCT: 33.6 % — ABNORMAL LOW (ref 36.0–46.0)
HCT: 34.6 % — ABNORMAL LOW (ref 36.0–46.0)
HCT: 45.9 % (ref 36.0–46.0)
MCH: 30.9 pg (ref 26.0–34.0)
MCH: 31.4 pg (ref 26.0–34.0)
MCHC: 32.7 g/dL (ref 30.0–36.0)
MCHC: 33.8 g/dL (ref 30.0–36.0)
MCV: 93.1 fL (ref 78.0–100.0)
MCV: 93.8 fL (ref 78.0–100.0)
MCV: 94.6 fL (ref 78.0–100.0)
Platelets: 282 10*3/uL (ref 150–400)
RDW: 13.2 % (ref 11.5–15.5)
RDW: 13.3 % (ref 11.5–15.5)
RDW: 13.5 % (ref 11.5–15.5)
WBC: 9.5 10*3/uL (ref 4.0–10.5)

## 2010-10-11 LAB — SURGICAL PCR SCREEN
MRSA, PCR: NEGATIVE
Staphylococcus aureus: NEGATIVE

## 2010-10-11 LAB — BASIC METABOLIC PANEL
BUN: 11 mg/dL (ref 6–23)
BUN: 9 mg/dL (ref 6–23)
BUN: 9 mg/dL (ref 6–23)
CO2: 30 mEq/L (ref 19–32)
CO2: 32 mEq/L (ref 19–32)
Chloride: 94 mEq/L — ABNORMAL LOW (ref 96–112)
Chloride: 95 mEq/L — ABNORMAL LOW (ref 96–112)
Chloride: 97 mEq/L (ref 96–112)
Creatinine, Ser: 0.81 mg/dL (ref 0.4–1.2)
Creatinine, Ser: 0.99 mg/dL (ref 0.4–1.2)
Glucose, Bld: 133 mg/dL — ABNORMAL HIGH (ref 70–99)
Glucose, Bld: 150 mg/dL — ABNORMAL HIGH (ref 70–99)
Potassium: 2.8 mEq/L — ABNORMAL LOW (ref 3.5–5.1)
Potassium: 3 mEq/L — ABNORMAL LOW (ref 3.5–5.1)
Potassium: 3.4 mEq/L — ABNORMAL LOW (ref 3.5–5.1)

## 2010-10-11 LAB — DIFFERENTIAL
Basophils Absolute: 0 10*3/uL (ref 0.0–0.1)
Basophils Relative: 1 % (ref 0–1)
Eosinophils Absolute: 0.2 10*3/uL (ref 0.0–0.7)
Eosinophils Relative: 2 % (ref 0–5)
Lymphocytes Relative: 32 % (ref 12–46)
Monocytes Absolute: 0.6 10*3/uL (ref 0.1–1.0)

## 2010-10-11 LAB — TYPE AND SCREEN: ABO/RH(D): O POS

## 2010-10-11 LAB — URINE MICROSCOPIC-ADD ON

## 2010-10-11 LAB — GLUCOSE, CAPILLARY: Glucose-Capillary: 150 mg/dL — ABNORMAL HIGH (ref 70–99)

## 2010-10-12 ENCOUNTER — Other Ambulatory Visit: Payer: Medicare Other

## 2010-10-12 ENCOUNTER — Encounter (INDEPENDENT_AMBULATORY_CARE_PROVIDER_SITE_OTHER): Payer: Self-pay | Admitting: *Deleted

## 2010-10-12 DIAGNOSIS — R197 Diarrhea, unspecified: Secondary | ICD-10-CM

## 2010-10-12 NOTE — Procedures (Addendum)
Summary: Colonoscopy  Patient: [name] Note: All result statuses are Final unless otherwise noted.  Tests: (1) Colonoscopy (COL)   COL Colonoscopy           DONE     [facility]     [address]     [place], [place]  [address]          COLONOSCOPY PROCEDURE REPORT          PATIENT:  [name], [name]  MR#:  [id number]     BIRTHDATE:  [birth year], 77 yrs. old  GENDER:  female     ENDOSCOPIST:  Hedwig Morton. Juanda Chance, MD     REF. BY:  Juluis Rainier, M.D.     PROCEDURE DATE:  10/05/2010     PROCEDURE:  Colonoscopy 29562     ASA CLASS:  Class II     INDICATIONS:  hx of recent ischemic colitis m1/2012, last exam was     flex sigmoid in 2007     MEDICATIONS:   Versed 7 mg, Fentanyl 75 mcg          DESCRIPTION OF PROCEDURE:   After the risks benefits and     alternatives of the procedure were thoroughly explained, informed     consent was obtained.  Digital rectal exam was performed and     revealed no rectal masses.   The LB [id number] endoscope     was introduced through the anus and advanced to the cecum, which     was identified by both the appendix and ileocecal valve, without     limitations.  The quality of the prep was good, using MoviPrep.     The instrument was then slowly withdrawn as the colon was fully     examined.     <<PROCEDUREIMAGES>>          FINDINGS:  Moderate diverticulosis was found in the sigmoid colon     (see image1 and image5).  A sessile polyp was found. 8mm sessile     multilobulated polyp at 60 cm Polyp was snared without cautery.     Retrieval was successful. snare polyp  This was otherwise a normal     examination of the colon (see image7, image2, and image3).     Retroflexed views in the rectum revealed no abnormalities.    The     scope was then withdrawn from the patient and the procedure     completed.          COMPLICATIONS:  None     ENDOSCOPIC IMPRESSION:     1) Moderate diverticulosis in the sigmoid colon     2)  Sessile polyp     3) Otherwise normal examination     RECOMMENDATIONS:     1) Await pathology results     2) High fiber diet.     REPEAT EXAM:  In 5 year(s) for.  recall depending on polyp     pathology          ______________________________     Hedwig Morton. Juanda Chance, MD          CC:          n.     eSIGNED:   Hedwig Morton. Alijah Akram at 10/05/2010 02:20 PM          Arnetha Massy, [phone]  Note: An exclamation mark (!) indicates a result that was not dispersed into the flowsheet. Document Creation Date: 10/05/2010 2:20 PM _______________________________________________________________________  (1) Order  result status: Final Collection or observation date-time: 10/05/2010 14:09 Requested date-time:  Receipt date-time:  Reported date-time:  Referring Physician:   Ordering Physician: Lina Sar [phone]) Specimen Source:  Source: Launa Grill Order Number: [id number] Lab site:   Appended Document: Colonoscopy     Procedures Next Due Date:    Colonoscopy: 09/2015

## 2010-10-15 ENCOUNTER — Other Ambulatory Visit: Payer: Self-pay | Admitting: Internal Medicine

## 2010-10-15 ENCOUNTER — Encounter: Payer: Self-pay | Admitting: Internal Medicine

## 2010-10-19 ENCOUNTER — Telehealth: Payer: Self-pay | Admitting: Internal Medicine

## 2010-10-19 NOTE — Progress Notes (Signed)
Summary: Triage  Phone Note Call from Patient Call back at Home Phone [phone]   Caller: Patient Call For: Dr. Juanda Chance Reason for Call: Talk to Nurse Summary of Call: Diarrhea, loose stools 4-5 times yesterday...wants to discuss Initial call taken by: Karna Christmas,  October 11, 2010 11:23 AM  Follow-up for Phone Call        Patient calling to report that last night, she had bad abdominal cramping and diarrhea. States she started out with formed stools then after several bowel movements it was "almost liquid." States she had 4-5 bowel movements last night, then she took Imodium 2 tabs and the diarrhea stopped. States the rectal area "feels like I could have diarrhea again but I do not have any cramps." States she has been on the [employer] since yesterday. States her MD has changed her BP medications. She now takes Norvasc 1/2 tab daily and the HCTZ has been d/c'ed. Her BP is 143/80. Last colonoscopy 10/05/10- mod. diverticulosis, sessile polyp. Please, advise. Follow-up by: Jesse Fall RN,  October 11, 2010 12:18 PM  Additional Follow-up for Phone Call Additional follow up Details #1::        please obtain stool for C.Diff, Stool culture and stool Lactoferrin. Please start Flagyl 250 mg by mouth three times a day, # 30, Please add Questran 4gm/pack, #15 packs,  1 pack in glass of water once daily x 2 weeks. Additional Follow-up by: Hart Carwin MD,  October 11, 2010 10:53 PM    Additional Follow-up for Phone Call Additional follow up Details #2::    Labs ordered in EPIC. Message left for patient to call back. Jesse Fall, RN 10/12/10 9:28 AM Spoke with patient. She will have her husband pick up the stool sample collection cups today. She understands not to start her antibiotics until after she collects the stool samples. Rxs to pharmacy. Follow-up by: Jesse Fall RN,  October 12, 2010 9:47 AM  New/Updated Medications: FLAGYL 250 MG TABS (METRONIDAZOLE) Take one by mouth TID QUESTRAN 4  GM PACK (CHOLESTYRAMINE) Take one pack in glass of water once daily x 2 weeks Prescriptions: QUESTRAN 4 GM PACK (CHOLESTYRAMINE) Take one pack in glass of water once daily x 2 weeks  #15 x 0   Entered by:   Jesse Fall RN   Authorized by:   Hart Carwin MD   Signed by:   Jesse Fall RN on 10/12/2010   Method used:   Electronically to        [facility]. [phone]* (retail)       [phone] [address].       [place], [place]  [address]       Ph: [phone]       Fax: [phone]   RxID:   [id number] FLAGYL 250 MG TABS (METRONIDAZOLE) Take one by mouth TID  #30 x 0   Entered by:   Jesse Fall RN   Authorized by:   Hart Carwin MD   Signed by:   Jesse Fall RN on 10/12/2010   Method used:   Electronically to        [facility]. [phone]* (retail)       [phone] [address].       [place], [place]  [address]       Ph: [phone]       Fax: [phone]   RxID:  [id number]  

## 2010-10-19 NOTE — Telephone Encounter (Signed)
Patient calling to see if she can take Tramadol and Robaxin while she is taking Flagyl. Please advise.

## 2010-10-19 NOTE — Telephone Encounter (Signed)
Yes, she can take Flagyl with Robaxin and tramadol.

## 2010-10-19 NOTE — Letter (Signed)
Summary: Patient Notice- Polyp Results  [facility]  [address] [place], [place] [address]   Phone: [phone]  Fax: [phone]        October 11, 2010 MRN: [id number]    [name] [address] [place], [place]  [address]    Dear [name],  I am pleased to inform you that the colon polyp(s) removed during your recent colonoscopy was (were) found to be benign (no cancer detected) upon pathologic examination.The polyp was adenomatous ( premalignant)  I recommend you have a repeat colonoscopy examination in _5 years to look for recurrent polyps, as having colon polyps increases your risk for having recurrent polyps or even colon cancer in the future.  Should you develop new or worsening symptoms of abdominal pain, bowel habit changes or bleeding from the rectum or bowels, please schedule an evaluation with either your primary care physician or with me.  Additional information/recommendations:  _x_ No further action with gastroenterology is needed at this time. Please      follow-up with your primary care physician for your other healthcare      needs.  __ Please call [phone] to schedule a return visit to review your      situation.  __ Please keep your follow-up visit as already scheduled.  __ Continue treatment plan as outlined the day of your exam.  Please call us if you are having persistent problems or have questions about your condition that have not been fully answered at this time.  Sincerely,  Hart Carwin MD  This letter has been electronically signed by your physician.  Appended Document: Patient Notice- Polyp Results letter mailed

## 2010-10-20 NOTE — Telephone Encounter (Signed)
Patient notified of Dr. Brodie's recommendations. 

## 2010-11-12 ENCOUNTER — Emergency Department (HOSPITAL_COMMUNITY)
Admission: EM | Admit: 2010-11-12 | Discharge: 2010-11-12 | Disposition: A | Discharge status: Home / Self Care | Payer: Medicare Other | Attending: Emergency Medicine | Admitting: Emergency Medicine

## 2010-11-12 ENCOUNTER — Emergency Department (HOSPITAL_COMMUNITY): Payer: Medicare Other

## 2010-11-12 DIAGNOSIS — Z96659 Presence of unspecified artificial knee joint: Secondary | ICD-10-CM | POA: Insufficient documentation

## 2010-11-12 DIAGNOSIS — M25519 Pain in unspecified shoulder: Secondary | ICD-10-CM | POA: Insufficient documentation

## 2010-11-12 DIAGNOSIS — I10 Essential (primary) hypertension: Secondary | ICD-10-CM | POA: Insufficient documentation

## 2010-11-12 DIAGNOSIS — Z96649 Presence of unspecified artificial hip joint: Secondary | ICD-10-CM | POA: Insufficient documentation

## 2010-11-12 DIAGNOSIS — Z96619 Presence of unspecified artificial shoulder joint: Secondary | ICD-10-CM | POA: Insufficient documentation

## 2010-12-10 ENCOUNTER — Ambulatory Visit (HOSPITAL_COMMUNITY): Payer: Medicare Other | Attending: Family Medicine

## 2010-12-10 DIAGNOSIS — M810 Age-related osteoporosis without current pathological fracture: Secondary | ICD-10-CM | POA: Insufficient documentation

## 2010-12-14 NOTE — Op Note (Signed)
[name], [name]              ACCOUNT NO.:  [id number]   MEDICAL RECORD NO.:  [id number]          PATIENT TYPE:  AMB   LOCATION:  [facility]                         FACILITY:  [facility]   PHYSICIAN:  Marlowe Kays, M.D.  DATE OF BIRTH:  October 06, 1932   DATE OF PROCEDURE:  08/09/2007  DATE OF DISCHARGE:                               OPERATIVE REPORT   PREOPERATIVE DIAGNOSIS:  Right carpal tunnel syndrome.   POSTOPERATIVE DIAGNOSIS:  Right carpal tunnel syndrome.   OPERATION:  Decompression median nerve right wrist and hand.   SURGEON:  Marlowe Kays, M.D.   ASSISTANT:  Nurse.   ANESTHESIA:  IV regional plus supplementation with local, 1% Xylocaine.   JUSTIFICATION OF PROCEDURE:  She has had carpal tunnel release on the  left with excellent result and has similar symptoms on the right.  She  had significantly abnormal carpal tunnel nerve studies on the left.  They have not been done on the right because of her previous history and  success with the left and also her symptomatology.  At surgery she was  noted to have significant compression of the median nerve in the carpal  canal.   DESCRIPTION OF PROCEDURE:  Antibiotic coverage already on board plus  prophylactic antibiotics.  She has a right total shoulder.  We started  out with an IV regional anesthetic and the right arm was prepped with  Betadine from mid forearm to fingertips and draped in a sterile field.  Time-out performed.  I marked out the proposed incision along the base  of the thenar eminence crossing obliquely over the flexor crease of the  wrist and the distal forearm.  Before starting the anesthetic, she had  some venous type engorgement in her fingers and had an incomplete block  so I supplemented the incision with 1% plain lidocaine.  I then began  making the incision and she had a real problem with bleeding which I was  unable to stop with either bipolar cautery or regular Bovie.  Accordingly, when I felt it was  safe, we released the tourniquet and the  bleeding almost completely stopped and I was able to finish the case  with just the local.  The median nerve was identified at the wrist  beneath the palmaris longus tendon which was retracted to the radial  side, then began releasing skin, subcutaneous tissue and fascia into the  distal palm.  She had marked compression of the median nerve which was  swollen in the mid palm.  I dissected out carefully with a hemostat the  nerve to the level of the distal palmar crease.  There was no unusual  bleeding at the time of closure.  After irrigating the wound well with  sterile saline, skin and subcutaneous tissue were closed with  interrupted 4-0 nylon mattress sutures, Betadine, Adaptic, dry sterile  dressing and volar plaster splint were applied.  She tolerated the  procedure well and was taken to the recovery room in satisfactory  condition with no known complications.          ______________________________  Marlowe Kays, M.D.  JA/MEDQ  D:  08/09/2007  T:  08/09/2007  Job:  [id number]

## 2010-12-17 NOTE — Op Note (Signed)
[name], [name]              ACCOUNT NO.:  [id number]   MEDICAL RECORD NO.:  [id number]          PATIENT TYPE:  AMB   LOCATION:  [facility]                         FACILITY:  [facility]   PHYSICIAN:  Marlowe Kays, M.D.  DATE OF BIRTH:  [birth year]   DATE OF PROCEDURE:  11/07/2005  DATE OF DISCHARGE:                                 OPERATIVE REPORT   PREOPERATIVE DIAGNOSIS:  Painful total knee replacement, right.   POSTOPERATIVE DIAGNOSIS:  Painful total knee replacement, right with  suspected entrapment of soft tissue.   OPERATION:  Right knee arthroscopy with debridement.   SURGEON:  Dr. Simonne Come   ASSISTANT:  Nurse.   ANESTHESIA:  General.   INDICATIONS FOR PROCEDURE:  She has been roughly a year since her total knee  replacement on the right and was doing well postoperatively until following  an aggressive physical therapy program. She says her knee has never been the  same since. She has had continued pain and swelling of the knee with workup  for infection loosening being negative I felt that perhaps she had had  internal tissue damage with perhaps some entrapment. Her pain was mainly  medially at the joint but also slightly laterally and she felt like there  was a tight band present.  Accordingly, she is here today for right knee  arthroscopy for evaluation of the total knee replacement and culture and  debridement as necessary.   PROCEDURE:  Prophylactic antibiotics, satisfactory general anesthesia,  pneumatic tourniquet applied with care to protect her right total hip. Right  leg was Esmarch'd out non-sterilely.  Thigh stabilizer applied and prepped  with DuraPrep from stabilizer to ankle and draped in sterile field.  Knee  support and Ace wrap to left leg. She had an effusion so I first perforated  her through the superior medial portal area. Roughly 30 mL of bloody fluid  came forth and this was sent for culture. I then continued using this as the  inflow apparatus.  Prior to this I had marked out the anatomy of the knee and  used needles to confirm anterior medial and anterolateral portal sites  working first through an anterior medial portal. I was able to get a good  look at the knee joint and there was hemorrhagic soft tissue in the  intercondylar area and particularly laterally between the lateral femoral  component and the lateral tibial component.  This appeared to be entrapment  type tissue and the hemorrhage to me confirmed that this was the most likely  source of her problem.  Through an anterolateral portal I was able to use a  3.5 shaver and clean out the intercondylar area and both medial and lateral  joints removing all the potentially entrapping tissue.  I was unable to get  up in super patellar area through these portals so I switched and used my  inflow in the anteromedial portal and the scope through the superior medial  portal and then the went superior laterally and placed a 3.5 shaver through  it and removed some minor adhesions and fibrous bands in the supra patellar  area.  There was no hemorrhage there so I did not think that based on what I  saw that this was the most likely site for her problem.  Her patella  component looked healthy. After completing this part of the surgery.  I fell  I had debrided everything that needed to be debrided. I removed all fluid  possible and injected through the inflow apparatus 20 mL 0.05% Marcaine with  adrenaline.  All  four portals were closed with 4-0 nylon.  Betadine Adaptic and dry sterile  dressing were applied.  Tourniquet was released.  She tolerated the  procedure well and was taken to the recovery room in satisfactory condition  without complications.           ______________________________  Marlowe Kays, M.D.     JA/MEDQ  D:  11/07/2005  T:  11/08/2005  Job:  [id number]

## 2010-12-17 NOTE — Op Note (Signed)
[place]  Patient:    NAKEA, GOUGER                     MRN: [id number] Proc. Date: 08/11/00 Adm. Date:  [id number] Attending:  Carlean Purl CC:         Jamesetta Geralds, M.D.   Operative Report  PREOPERATIVE DIAGNOSIS:  Chronic right maxillary, right ethmoid, right sphenoid sinus disease.  POSTOPERATIVE DIAGNOSES: 1. Chronic right maxillary, right ethmoid, right sphenoid sinus disease. 2. Findings consistent with right maxillary fungal sinus disease.  PROCEDURE:  Functional endoscopic sinus surgery with right maxillary ostial enlargement, with removal of fungal sinus disease, right ethmoidectomy, right sphenoidotomy.  SURGEON:  Kristine Garbe. Ezzard Standing, M.D.  ANESTHESIA:  General endotracheal.  COMPLICATIONS:  None.  BRIEF CLINICAL NOTE:  Cola Gane is a 77 year old female who has had problems with chronic cough as well as postnasal drainage from sinus problems she has had for a number of years.  She has had previous surgery a little over a year ago, which seemed to help temporarily, but now she has re-developed problems.  On the follow-up CT scan, she has total opacification of the right maxillary sinus, partial opacification of the right ethmoid and right sphenoid sinus area.  The frontal sinuses were clear, and the left maxillary and ethmoid sinuses were relatively clear.  She is taken to the operating room at this time for right functional endoscopic sinus surgery.  DESCRIPTION OF PROCEDURE:  After adequate endotracheal anesthesia, the right nose was prepped with Betadine solution and draped out with sterile towels. The nose was then further prepped with cotton pledgets soaked in decongestant solution and middle meatus and middle turbinate was injected with Xylocaine with epinephrine.  The patient received 1 g of Ancef IV preoperatively.  Then using the sickle knife, the uncinate process was incised and the  maxillary ostium was enlarged with straight, through-cut, and back-cutting forceps.  On opening the right maxillary sinus, there was a large amount of thick, green-gray debris within the sinus, consistent with fungal disease.  This was removed and sent to pathology.  The sinus was then suctioned and irrigated with saline until it was clean.  Following this, the microdebrider was used to open up the anterior and posterior ethmoid region.  There were some polypoid changes but no gross purulent discharge or sinus disease identified.  Next, the sphenoid sinus was opened on the right side.  This was enlarged with a microdebrider and along the posterior floor of the sphenoid sinus, there were some polypoid changes but again no obvious fungal elements identified.  There was a little bit of yellow mucus within the sphenoid sinus, possibly representing a small mucocele.  This was cleaned out with suction.  This completed the procedure.  After irrigating the right sinus region with saline, a single Kennedy sinus pack was placed and was hydrated with 40 mg of Depo-Medrol.  The patient received 8 mg of Decadron IV postoperatively.  The patient was subsequently awoken from anesthesia and transported to recovery postoperatively doing well.  DISPOSITION:  Macel is discharged home later this morning on Keflex 500 mg b.i.d. for one week, Tylenol and Vicodin one to two q.4h. p.r.n. pain.  She will follow up in my office in three days on Monday, to have her Kyung Rudd sinus pack removed. DD:  08/11/00 TD:  08/11/00 Job: [id number]

## 2010-12-17 NOTE — H&P (Signed)
[name], [name]              ACCOUNT NO.:  [id number]   MEDICAL RECORD NO.:  [id number]          PATIENT TYPE:  INP   LOCATION:  NA                           FACILITY:  [facility]   PHYSICIAN:  Marlowe Kays, M.D.  DATE OF BIRTH:  1933-06-23   DATE OF ADMISSION:  01/11/2005  DATE OF DISCHARGE:                                HISTORY & PHYSICAL   CHIEF COMPLAINT:  Pain in my right knee.   HISTORY OF PRESENT ILLNESS:  This 77 year old white female has been seen by  Dr. Simonne Come for continued progressive problems concerning pain into her  right knee. She had conservative care, which has included anti-  inflammatories, __________ supplementation, injections to the knee, as well  as a Tens unit and therapy. Unfortunately, she has continued with  progressive problems concerning her right knee. X-rays have shows  deterioration of the joint space, which is compatible with osteoarthritis.  After much discussion including the risks and benefits of surgery, it is  felt that the patient would benefit from surgical intervention and is being  admitted for total knee replacement arthroplasty of the right knee.   PAST MEDICAL HISTORY:  Dr. Sigmund Hazel of [facility] is her  family physician. She has hypertension, reflux, and occasional urinary tract  infection.   ALLERGIES:  CIPROFLOXACIN, EPHEDRINE, PSEUDOEPHEDRINE, PERCOCET.   CURRENT MEDICATIONS:  1.  Zoloft 100 mg q. a.m.  2.  Verapamil ER 240 mg 1 q. a.m.  3.  Mobic 7.5 mg 1 q. a.m. (will stop 5 to 6 days prior to surgery).  4.  Nortriptyline 100 mg q.h.s.  5.  Xanax 0.5 mg 1 or 2 daily for anxiety.  6.  Calcium 1200 mg q.d.  7.  Vitamin D 800 mg q.d.  8.  Nexium in a.m.  9.  Diovan 160 mg q. a.m.  10. Hydrocodone p.r.n. pain.   PAST SURGICAL HISTORY:  Include a hysterectomy in 1982, rhinoplasty in the  past, right shoulder hemiarthroplasty secondary to bad fracture in 2002 and  revision done in 2004. Ganglion cyst,  numerous, on her wrists. Tonsillectomy  and adenoidectomy as a child. Right total hip replacement arthroplasty in  2004 by Dr. Simonne Come.   FAMILY HISTORY:  Positive for heart disease, hypertension, diabetes, cancer,  stroke, and arthritis.   SOCIAL HISTORY:  The patient is married. She is a retired Radio producer. No intake of alcohol or tobacco products. Has 4 children,  two females and two males. Spouse and cousin will be caregiver after  surgery.   REVIEW OF SYSTEMS:  CNS:  No seizures, stroke, or paralysis. HEENT:  Normal  vision. RESPIRATORY:  The patient has a non-productive cough secondary to  reflex disease and post-nasal drip from her sinuses. Occasional shortness of  breath. No hemoptysis. CARDIOVASCULAR:  No chest pain. No angina or  orthopnea. GASTROINTESTINAL:  No nausea, vomiting, melena, or bloody stool.  She does well with her reflux medications. GENITOURINARY:  No discharge or  hematuria but she has stress incontinence. Occasional cystitis with urinary  tract infections but none recently. These are usually treated  with sulfa  preparations such as Septra DS. MUSCULOSKELETAL:  Primarily in present  illness with her right knee.   PHYSICAL EXAMINATION:  GENERAL:  Alert, cooperative, friendly.  NEUROLOGIC:  A 77 year old female who walks with an antalgic gait on the  right. She does not use a walking device.  VITAL SIGNS:  Blood pressure 110/68, pulse 98 and regular. Respiratory rate  12.  HEENT:  Normocephalic. Pupils are equal, round, and reactive to light and  accommodation. Extraocular muscles intact. Oropharynx clear.  CHEST:  Clear to auscultation. No rhonchi, rales.  HEART:  Regular rate and rhythm. No murmurs are heard.  ABDOMEN:  Soft, nontender. Liver and spleen not felt.  GENITALIA/RECTAL/PELVIC/BREASTS:  Not done. Not pertinent to present  illness.  EXTREMITIES:  The patient has painful range of motion with the right knee.  No gross laxity. No  swelling.  NEUROLOGIC:  Intact.   ADMISSION DIAGNOSES:  1.  Severe osteoarthritis of the right knee with a joint collapse.  2.  Hypertension.  3.  Post nasal drip with sinusitis.  4.  Gastroesophageal reflux disease.  5.  Occasional urinary tract infection.   PLAN:  The patient will undergo a right total knee replacement arthroplasty.  She has stated that she does not want any morphine after surgery, that is  causes nothing but confusion, without relieving the pain. She has had high  success with Dilaudid PCA postoperatively or Dilaudid IV in the past. She  will have home health with [place]. Should we have any medical problems,  will certainly call Dr. Sigmund Hazel or one of her associates during this  patient's hospitalization.      DLU/MEDQ  D:  12/31/2004  T:  12/31/2004  Job:  [id number]   cc:   Marlowe Kays, M.D.  [address]  [place]  [place] [address]  Fax: [phone]   Sigmund Hazel, M.D.  [address]  [address] [place], [place] [address]  Fax: [phone]

## 2010-12-17 NOTE — H&P (Signed)
[facility]  Patient:    [name], [name] Visit Number: [id number] MRN: [id number]          Service Type: MED Location: 5000 [id number] Attending Physician:  Lubertha South Dictated by:   Ottie Glazier Wynona Neat, P.A.-C. Admit Date:  05/18/2001                           History and Physical  CHIEF COMPLAINT:  Right shoulder pain.  HISTORY OF PRESENT ILLNESS:  Ms. Castanon is a 77 year old white female, right-hand-dominant, who fell at approximately 12:30 p.m.  The patient states that she was at a local college getting a transcript when she turned and tripped over a bookbag and landed directly onto her right arm and shoulder. Patient states she had immediate pain and disability of the right shoulder. She denied any numbness or tingling of the right upper extremity.  Due to severe pain and disability, patient was taken to the [facility] ER by her husband where she was seen and evaluated by the orthopedic surgeon, Dr. Otelia Sergeant, on call.  X-rays were reviewed in the ER and show she had a right humeral head comminuted fracture in four parts with anterior dislocation.  ALLERGIES:  COMPAZINE gives her increased heart rate, CIPRO, and PSEUDOEPHEDRINE.  MEDICATIONS: 1. Paxil CR 25 mg one p.o. q.d. 2. Mobic p.r.n. - she has taken none in one or two days. 3. Verapamil ER one p.o. q.d. 4. Xanax 0.5 one p.o. p.r.n. 5. Maxzide 37.5/12.5 one p.o. q.d. 6. Zyrtec one p.o. q.d. 7. Evista 60 mg one p.o. q.d. 8. Vitamin D 800 units once daily. 9. Calcium 1500 mg a day.  Also has a history of taking Xenical.  PAST MEDICAL HISTORY: 1. Hypertension. 2. Panic disorder. 3. Questionable asthma. 4. Chronic sinusitis. 5. Osteoarthritis bilateral knees.  PAST SURGICAL HISTORY:  Patient states she has had multiple "ganglion cyst surgeries" of bilateral hands.  She has also had a tonsillectomy and total vaginal hysterectomy in 1982.  Patient has been seen and evaluated  at [facility] per Dr. Leslee Home and had a series of Hyalgan injections to the right knee.  SOCIAL HISTORY:  She is married with four children.  No smoking, no alcohol.  FAMILY DOCTOR:  Dr. Chales Salmon. Thacker.  FAMILY HISTORY:  Father deceased from bladder cancer.  Her mother deceased at 54 - she had CHF and arthritis.  REVIEW OF SYSTEMS:  GENERAL:  Patient states she has lost 25 pounds since July; however, she has been dieting.  Otherwise, denies any fever, chills, decrease in appetite.  HEENT:  She does wear corrective lenses.  States she has chronic sinus problems.  Otherwise, denies any chronic headaches, ringing of the ears.  CHEST:  She states that she has occasional cough from drainage. Otherwise, no chronic cough, productive cough, or hemoptysis.  CARDIOVASCULAR: Denies any chest pain.  States she has occasional shortness of breath secondary to allergies.  Otherwise, no shortness of breath, syncopal episodes, no irregular heart beats.  GI/GU:  States she has occasional stress incontinence but denies any chronic diarrhea, melena, bright red stools per rectum.  EXTREMITIES:  Please see HPI.  NEUROLOGIC:  She denies any history of seizures or strokes.  PHYSICAL EXAMINATION:  VITAL SIGNS AT TIME OF ADMISSION:  Blood pressure 146/85, respirations 20, pulse 88, temperature 97.2.  GENERAL:  This is a very pleasant 77 year old white female, somewhat uncomfortable.  HEENT:  Head  is atraumatic, normocephalic.  NECK:  Supple without masses or carotid bruits.  CHEST:  Clear to auscultation.  HEART:  Regular rate and rhythm, S1, S2.  ABDOMEN:  Soft, nontender.  Bowel sounds positive, no guarding, no rebound.  EXTREMITIES:  Right upper extremity:  Patient is guarding right upper extremity; therefore range of motion of the shoulder was not performed. However, she did have good flexion-extension of the wrist. Abduction-adduction of fingers were intact.  Sensation  was intact.  She had 2+ radial pulses.  SKIN:  Within normal limits.  LABORATORY DATA:  X-ray of the right upper extremity revealed a comminuted x 4 compacted humeral head fracture with anterior dislocation.  IMPRESSION:  Right comminuted humeral head fracture.  PLAN:  Patient will be admitted and will undergo a right shoulder humeral arthroplasty per Dr. Otelia Sergeant.  He has discussed with her and her family the risks and benefits of this surgery to which they state a good understanding prior to entering the operating suite. Dictated by:   Ottie Glazier. Wynona Neat, P.A.-C. Attending Physician:  Lubertha South DD:  05/19/01 TD:  05/19/01 Job: [id number]

## 2010-12-17 NOTE — Letter (Signed)
May 11, 2006     Sigmund Hazel, M.D.  [address], [place] [address]   RE:  Carrie, [name]  MRN:  [id number]  /  DOB:  06-01-1933   Dear Misty Stanley:   I would like to give you followup on [name], who is your patient.  [name] had a flexible sigmoidoscopy in 1999, and was suppose to have a  colonoscopy in 2004.  For some reason, she did not schedule the procedure,  but at your request, has called our office recently and scheduled again a  flexible sigmoidoscopy not being aware of the fact that she needed full  colonoscopy.  We went ahead today and did a flexible sigmoidoscopy which  showed mild to moderate diverticulosis of the left colon.  I advised [name] to have a barium enema to complete the screening.  This will be done  next week or so when they return from a trip.  If her barium enema is normal  in terms of not having any polyps or any suspicious lesions, then I would  suggest for her to have a full colonoscopy in 10 years.  As you know, she  has no risk factors for colon cancer.  If her barium enema shows some  abnormalities such as questionable polyps or so, we will have to decide  whether she would need a full colonoscopy or possibly colonoscopy in 5 years  instead of 10 years.  This is just for your information and I appreciate you  sharing this patient with [place].    Sincerely,      Hedwig Morton. Juanda Chance, MD   DMB/MedQ  /  Job #:  [id number]  DD:  05/11/2006 / DT:  05/12/2006

## 2010-12-17 NOTE — Op Note (Signed)
[name], [name]              ACCOUNT NO.:  [id number]   MEDICAL RECORD NO.:  [id number]          PATIENT TYPE:  AMB   LOCATION:  NESC                         FACILITY:  [facility]   PHYSICIAN:  Marlowe Kays, M.D.  DATE OF BIRTH:  [birth year]   DATE OF PROCEDURE:  03/07/2006  DATE OF DISCHARGE:                                 OPERATIVE REPORT   PREOPERATIVE DIAGNOSIS:  Severe left carpal tunnel syndrome.   POSTOPERATIVE DIAGNOSIS:  Severe left carpal tunnel syndrome.   OPERATION:  Decompression median nerve, left wrist and hand.   SURGEON:  Marlowe Kays, M.D.   ASSISTANT:  Nurse.   ANESTHESIA:  IV regional.   PATHOLOGY AND JUSTIFICATION FOR PROCEDURE:  Signs and symptoms of carpal  tunnel syndrome with nerve conduction studies demonstrating severe median  neuropathy.   PROCEDURE:  Satisfactory regional anesthesia, prophylactic antibiotics due  to other total joint replacements.  Left arm was prepped with DuraPrep from  mid forearm to fingertips and draped in sterile field.  I marked out a  curved incision crossing along the base of thenar eminence crossing  obliquely over the flexor crease of the wrist in the distal forearm.  The  palmaris longus tendon was identified and retracted to the radial side.  Beneath it was a very bulbous median nerve.  I carefully released skin,  subcutaneous tissue and fascia into the proximal carpal canal where the main  area of compression was located.  Potential bleeders were coagulated with  bipolar cautery.  Releasing the nerve at this point seemed to decompress it  significantly.  I then continued the release in the distal palm dissecting  it individual branches until all compression had been released. The wound  was then irrigated sterile saline and the skin and subcutaneous tissue only  closed with interrupted 4-0 nylon mattress sutures.  Betadine, Adaptic, dry  sterile dressing, volar plaster splint were applied.  Tourniquet was  released. At the time of dictation she was on her way to recovery in  satisfactory condition with no known complications.           ______________________________  Marlowe Kays, M.D.     JA/MEDQ  D:  03/07/2006  T:  03/07/2006  Job:  [id number]

## 2010-12-17 NOTE — Op Note (Signed)
[name], [name]              ACCOUNT NO.:  [id number]   MEDICAL RECORD NO.:  [id number]          PATIENT TYPE:  INP   LOCATION:  0004                         [facility]ACILITY:  [facility]   PHYSICIAN:  Marlowe Kays, M.D.  DATE OF BIRTH:  [birth year]   DATE OF PROCEDURE:  01/11/2005  DATE OF DISCHARGE:                                 OPERATIVE REPORT   PREOPERATIVE DIAGNOSIS:  Osteoarthritis, right knee.   POSTOPERATIVE DIAGNOSIS:  Osteoarthritis, right knee.   OPERATION PERFORMED:  Osteonics total knee replacement, right.   SURGEON:  Marlowe Kays, M.D.   ASSISTANTDruscilla Brownie. Cherlynn June.   ANESTHESIA:  Spinal.   INDICATIONS FOR PROCEDURE:  She had tricompartmental arthritis with bone on  bone abutment medially.  We tried viscosupplementation and other nonsurgical  and other nontotal knee replacement type alternatives prior to today's  surgery.   DESCRIPTION OF PROCEDURE:  Satisfactory spinal anesthetic.  Foley catheter  inserted, prophylactic antibiotics.  Lateral hip stabilizer, Sure-Foot,  pneumatic tourniquet, left leg was prepped from tourniquet to ankle with  DuraPrep and draped in a sterile field.  Ioban employed.  The leg was  esmarched out sterilely.  Vertical midline incision down to the patellar  mechanism with median parapatellar incision to open the joint.  Pes  anserinus and medial collateral ligament were reflected off the proximal  tibia.  The patellar mechanism was freed up, patella everted and knee  flexed.  Osteophytes were removed from around the patella and the femur.  The ACL was sacrificed and the anterior portions of both menisci were  removed.  I then made a 5/16 inch drill hole in the distal femur followed by  the axis aligner and because she had a flexion contracture, I elected to  make a 12 mm cut off the distal femur.  Then I used the sizing jig measuring  her at a size 7 distal femur, scribe lines were placed on the distal femur,  then the  distal femoral cutting jig was applied and anterior and posterior  cuts  and posterior and anterior chamferings were then made.  Remnants of  the menisci and ACL and PCL were removed and I made a leveling cut on the  tibia, sizing it at a 7.  The base plate was placed, followed by the initial  intramedullary drill hole, followed by the step cut drill and then the canal  finder.  I then used the intramedullary rod and the external cutting jig set  for a 4 mm cut off the depressed medial tibial plateau.  The cut was made.  I then used the guide for making the patellar groove and then the  notchplasty was performed.  I then went through a trial reduction, found  that a 10 and very possibly a 12 mm spacer would be size of choice.  With  the knee in extension and using the external rods splitting  the bimalleolar  distance, I was able to mark the baseplate, scribe the lines on the anterior  tibia.  While the knee was in extension, I sized the patella at 26 and we  used the 10 mm recessed cutting jig to make the 10 mm recess cut and then  used the hole maker for creating three stabilization holes for the patella.  Trial patella was then inserted and bone around the perimeter of the patella  removed.  We then placed the knee in flexion and the tibial base plate was  then stabilized to the proximal tibia using the previously noted scribe  lines.  Attached to this we used the tripod apparatus to ream up to a 7  cemented tibia component.  The knee was then water picked while the  methacrylate was being mixed.  With the glue gun, I then glued in the  components individually starting first with the tibia.  I also before gluing  it in place, gelfoamed down in the canal just to the stem on the prosthesis  to serve as a canal filler.  After impacting the tibia and removing excess  methyl methacrylate, we did the same thing to the femur.  I then used a 10  mm spacer, held the knee in extension while we  glued in the patella and  holding it with a patellar bone holding clamp.  When the methacrylate had  hardened, excess methyl methacrylate was removed from around all components.  We went through another trial reduction finding that a 12 posterior  stabilized spacer was the appropriate size.  It gave good medial and lateral  stability in full extension.  The patella did not require lateral release.  We then irrigated the wound well and made sure that there were no remnants  of methyl methacrylate lying around and then we placed a final 12 mm  posterior stabilized insert and once again checked motion and stability.  We  then closed the wound over a Hemovac with interrupted #1 Vicryl in two  layers and the quadriceps tendon distally and synovium and capsule, 2-0  Vicryl subcutaneous tissue and staples in the skin.  Betadine Adaptic and  dry sterile dressing were applied.  Tourniquet was released with roughly an  hour and 40 minutes of tourniquet time.  The patient tolerated the procedure  well and was taken to the recovery room in satisfactory condition with no  known complications.  The estimated blood loss for this procedure was 0.       JA/MEDQ  D:  01/11/2005  T:  01/11/2005  Job:  [id number]

## 2010-12-17 NOTE — Discharge Summary (Signed)
NAME:  MONICIA, TSE                        ACCOUNT NO.:  [id number]   MEDICAL RECORD NO.:  [id number]                   PATIENT TYPE:  INP   LOCATION:  0452                                 FACILITY:  [facility]   PHYSICIAN:  Marlowe Kays, M.D.               DATE OF BIRTH:  06/13/1933   DATE OF ADMISSION:  06/18/2003  DATE OF DISCHARGE:  06/23/2003                                 DISCHARGE SUMMARY   ADMITTING DIAGNOSES:  1. Osteoarthritis of the right hip.  2. Hypertension.  3. Osteoporosis.   DISCHARGE DIAGNOSES:  1. Osteoarthritis of the right hip.  2. Hypertension.  3. Osteoporosis.  4. Postoperative anemia treated with transfusion.   OPERATION:  On June 18, 2003 the patient underwent Osteonics total hip  replacement arthroplasty of the right hip.  Ronald A. Gioffre, M.D.  assisted.   BRIEF HISTORY:  This 77 year old white female with progressive problems  concerning her right hip with recent increase in pain and discomfort and  interfering with her daily activities.  Range of motion is markedly reduced  secondary to the pain and x-rays have shown advanced osteoarthritic changes.  After much discussion and the risks and benefits being discussed as well  patient was scheduled for the above procedure.   HOSPITAL COURSE:  The patient tolerated the surgical procedure quite well.  She began working with physical therapy in the total hip protocol.  With her  medical background, she understood completely the reasons for therapy and  ADLs.  Durable medical equipment was fitted to her so she could have safe  ambulation.  It was of note she was planned to go home on November 21;  however, it was noted that she was weak and lightheaded and was having  difficulty with ambulation due to those factors.  Her hemoglobin had dropped  to 8.4 with a hematocrit of 24.9.  We felt that this postoperative anemia  would interfere with her recovery so we transfused her with 2 units of  banked blood.  This brought her hemoglobin up to 11.5, hematocrit 33.4.   On the day of discharge the patient was awake and alert.  Her daughter was  in the room with her.  Wound was dry, neurovascularly intact to the right  lower extremity.  She was afebrile.  It was felt she would be maintained in  her home environment with home physical therapy via [facility] and arrangements  were made for discharge.   LABORATORIES:  CBC with differential preoperatively completely within normal  limits.  Hemoglobin 13.1, hematocrit 38.5.  Final hemoglobin was 11.5,  hematocrit 33.4.  Blood chemistries were normal and a very mild hypokalemia  on November 15 which resolved.  When repeated on November 21 it was 3.8.  Urinalysis essentially negative for urinary tract infection.  Blood type was  O+.  Chest x-ray from [facility] showed no active disease.  Electrocardiogram also from there normal  sinus rhythm.   CONDITION ON DISCHARGE:  Improved, stable.   PLAN:  The patient is discharged to her home with home health with [place].  She is to be on Coumadin protocol for four weeks after the date of surgery.  Resume all home medications and diet.  Should she have any medical problems  she is to call Sigmund Hazel, M.D., her physician at Dr. Recardo Evangelist office.   DISCHARGE MEDICATIONS:  1. Trinsicon #60 one b.i.d.  2. Robaxin 500 mg #30 one q.6h. p.r.n. muscle spasm, two refills.  3. Dilaudid 2 mg tablets #40 one q.4-6h. p.r.n. pain.  4. Also on Coumadin protocol.  5. Recommend that she use Tylenol p.r.n. at home for any fever.  6. Continue with incentive spirometer.  7. Get stool softener to take daily as well.   FOLLOWUP:  Return to see Marlowe Kays, M.D. about two weeks after date of  surgery and follow up with Sigmund Hazel, M.D. per their instructions.     Dooley L. Cherlynn June.                 Marlowe Kays, M.D.    DLU/MEDQ  D:  06/23/2003  T:  06/23/2003  Job:  [id number]   cc:    Sigmund Hazel, M.D.  [address]  [address] [place], [place] [address]  Fax: [phone]

## 2010-12-17 NOTE — Discharge Summary (Signed)
NAMEVAN, EHLERT              ACCOUNT NO.:  [id number]   MEDICAL RECORD NO.:  [id number]          PATIENT TYPE:  INP   LOCATION:  1519                         FACILITY:  [facility]   PHYSICIAN:  Marlowe Kays, M.D.  DATE OF BIRTH:  07-09-1933   DATE OF ADMISSION:  01/11/2005  DATE OF DISCHARGE:  01/15/2005                                 DISCHARGE SUMMARY   ADMITTING DIAGNOSES:  1.  Severe osteoarthritis of the right knee with joint collapse.  2.  Hypertension.  3.  Postnasal drip with sinusitis.  4.  Gastroesophageal reflux disease.  5.  Occasional urinary tract infection.   DISCHARGE DIAGNOSES:  1.  Severe osteoarthritis of the right knee with joint collapse.  2.  Hypertension.  3.  Postnasal drip with sinusitis.  4.  Gastroesophageal reflux disease.  5.  Occasional urinary tract infection.  6.  Postoperative anemia treated with transfusion.  7.  Urinary tract infection postop (treated).   OPERATION:  On January 11, 2005, the patient underwent Osteonics total knee  replacement arthroplasty with all three components cemented, D. Ashley Akin, PA-C assisted.   BRIEF HISTORY:  This 77 year old lady has had progressive problems  concerning her  right knee with failed conservative care with anti-  inflammatories, Visco supplementation injections, TENS units and therapy.  Unfortunately she has had progressive deterioration in the knee to the point  where she is now requiring ambulatory assistance and her overall quality of  life is markedly being interfered with. After much discussion including the  risks and benefits of surgery and review of the collapsed joint with the  patient by x-ray, it was felt she would benefit with surgical intervention  and was admitted for the above procedure.   HOSPITAL COURSE:  The patient tolerated the surgical procedure quite well.  She was placed on total knee protocol which she did quite well. She was  tolerating the CPM well and neurovascular  remained intact to her operative  extremity. Hemovac was DC'd on day one as well as the O2. We followed her  hemoglobin very carefully and noted that she had a continuing downward slope  with her hemoglobin to 8.3. It was 14.6 preop and it was felt that this  would be interfering with her rehabilitation and prolong her  hospitalization, so we went ahead and transfused her two units of blood  bringing her hemoglobin up to 9.8.   It was felt the patient might benefit from a rehab program inpatient, but  she was adamant that she wanted to go home with home health, so we directed  our care towards that area. [facility] was shown chosen as her  home health  agent as the patient will need to continue with range of motion of the knee,  physical therapy dressing changes as well as follow-up with Coumadin  protocol four weeks after date of surgery.   The patient's urinary tract infection was treated with Septra DS, 1 b.i.d.  x3 days.   On day of discharge, the wound was dry, neurovascular was intact to the  operative extremity, the calf was soft. The  patient was anxious to continue  with her home physical therapy program. Vital signs were stable.   LABORATORY DATA:  Laboratory values the hospital, admitting CBC was  completely within normal limits. Hemoglobin was 14.6, hematocrit was 43.3.  Final hemoglobin was 9.8, hematocrit was 29.40. The chemistries all stayed  essentially within normal limits. She did have some mild hyponatremia and I  verbally told her to restrict water, to drink colas and liquids.  Preoperative urinalysis showed trace leuko esterase. When we repeated it on  January 10, 2005, she had leuko esterase small, she had hylan cast, bacteria  and we treated this as mentioned above. Electrocardiogram showed sinus  tachycardia with left atrial deviation and no chest x-ray noted on this  chart.   CONDITION ON DISCHARGE:  Improved, stable.   PLAN:  The patient discharged to her home,  continue with home medications  and diet and follow with her medical doctor as indicated. She may weightbear  as tolerated on the operative extremity. She may shower in four days after  surgery, use dry dressing p.r.n.   DISCHARGE MEDICATIONS:  1.  Coumadin per [facility] pharmacy four weeks after date of surgery.  2.  Trinsicon for anemia.  3.  Robaxin 500 mg for muscle spasm.  4.  Demerol tabs for pain.  5.  She was recommended to use over-the-counter laxative of choice and enema      of choice until bowel movement.      Druscilla Brownie   DLU/MEDQ  D:  01/26/2005  T:  01/26/2005  Job:  [id number]   cc:   Sigmund Hazel, M.D.  [address]  [address] [place], [place] [address]  Fax: [phone]

## 2010-12-17 NOTE — Op Note (Signed)
[place]. [facility]  Patient:    [name], [name] Visit Number: [id number] MRN: [id number]          Service Type: MED Location: 5000 [id number] Attending Physician:  Lubertha South Dictated by:   Kerrin Champagne, M.D. Proc. Date: 05/19/01 Admit Date:  05/18/2001 Discharge Date: 05/20/2001                             Operative Report  PREOPERATIVE DIAGNOSIS:  Right four-part humeral neck fracture.  POSTOPERATIVE DIAGNOSIS:  Right four-part humeral neck fracture with dislocation of humeral head, and this was noted preoperatively as well.  PROCEDURE:  Right shoulder cemented DePuy hemiarthroplasty using #7 stem with a 15 mm neck offset.  An 11 mm cement restrictor, local bone graft.  SURGEON:  Kerrin Champagne, M.D.  ASSISTANT: Ottie Glazier. Wynona Neat, P.A.-C.  ANESTHESIA:  GOT, Maren Beach, M.D., supplemented with scalene block.  ESTIMATED BLOOD LOSS:  200 cc.  DRAINS:  None.  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  She apparently fell, tripping over a book bag tonight while picking up transcripts.  She is obtaining a Scientist, water quality in theology.  In either case, she fell, landing directly on the right shoulder, sustaining a right four-part humeral neck fracture.  The humeral head is dislocated anteriorly and laterally.  Brought to the operating room to undergo primary right shoulder hemiarthroplasty.  INTRAOPERATIVE FINDINGS:  The humeral head was fractured into two fragments. No blood supply present.  The humeral neck was comminuted in at least three fragments.  The greater tuberosity had split into two fragments, a posterior fragment and an anterior fragment associated with the lesser tuberosity and the biceps tendon.  The patients humeral head showed evidence of osteoarthritis changes.  The glenoid, however, had an excellent  appearance.  DESCRIPTION OF PROCEDURE:  After adequate general anesthesia, the patient in the Schlein shoulder frame in beach chair position.  Standard prep with Duraprep solution on the right neck, right anterior and posterior thorax, including the axillary area and right shoulder, extending down the arm circumferentially to the midforearm.  Draped in the usual manner, iodine-impregnated Vi-Drape.  Standard preoperative antibiotics.  The incision a standard deltopectoral approach to the right shoulder incision approximately 15-20 cm in length in line with the coracoid process proximally and the deltoid tuberosity distally through the skin and subcutaneous layers using a 10 blade scalpel.  Electrocautery used to control bleeders.  The superficial fascial layer overlying the deltopectoral interval was incised using electrocautery.  The cephalic vein was not visible.  There did not appear to be significant structure here.  Blunt dissection then used to dissect planes between the deltoid and pectoralis muscle to the anterior clavipectoral fascia.  The axillary nerve, artery identified and preserved.  Clavipectoral fascia then incised superiorly over its midportion, entering the fracture site where the humeral head was immediately apparent and removed from the wound. The lesser tuberosity portion of the anterior aspect of the greater tuberosity were then tagged with #2 Fibrewire, two sutures.  The posterior aspect of the greater tuberosity was tagged with two Fibrewires as well.  These were allowed to retract the area.  Additional fragment of humeral head was found to be present in the joint medially and inferiorly and also posteriorly.  These were removed.  Additional  bony fragments free within the joint that seemed to represent humeral neck fragments were removed and then irrigated from the shoulder joint.  Following this, subperiosteal dissection carried out circumferentially about the  proximal humerus.  The bicipital groove anteriorly identified and marked for placement of the anterior fin of the permanent prosthesis.  Two drill holes placed laterally and then reaming begun of the proximal intramedullary canal of the humerus.  Only two reamers could be passed.  A 10 mm reamer could not be passed fully or fully seated, so a 7 mm stem was chosen.  Cement restrictor was trialled.  A #1 Osteonics restrictor was chosen, as it was 11 mm in diameter, and this was placed an expected 2 cm below the tip of the prosthesis within the intramedullary canal of the humerus proximally.  Irrigation was then carried out using a baby feeding tube and suctioned the proximal portion of the humerus to remove blood as well as liquid.  A permanent prosthesis was brought on the field after sizing and trialling the right shoulder using the 10 mm stem.  It was noted that the best position and alignment was with the anterior fin pointed toward the bicipital groove as described as well as using a 15 mm proud head and neck and positioning the stem at the fifth mark above the lateral anterior aspect of the humerus cortex proximally.  Cement was then mixed, and this was then placed within the intramedullary canal and then the permanent prosthesis seated in the correct degree of retroversion of approximately 20-30 degrees retroversion.  This was then held in place until cement was completely hardened.  Note the excess cement was removed.  Fibrewire had been placed through the holes previously drilled for the proximal humerus.  The cement had completely hardened, and the bone graft was obtained from the humeral head and neck fragments.  Fibrewires of the greater tuberosity and the lesser tuberosity were then passed, one underneath the other, and the Fibrewires from the proximal humerus were passed over the greater tuberosity and lesser tuberosity fragments, tension banding them to the proximal humeral  cortex laterally.  Bone grafting was performed prior to suturing each of the individual Fibrewires, allowing for tension banding of  the mass of the greater and lesser tuberosity and bone graft beneath.  With this, then, irrigation was performed and the incision closed, approximating the deltopectoral fascia with interrupted 0 Vicryl sutures, the more superficial layers with interrupted 2-0 Vicryl sutures, and the skin closed with stainless steel staples.  Adaptic, 4 x 4s, ABD pad fixed to the skin with Hypafix tape, ABD pad placed in the axilla, and a shoulder immobilizer applied.  The patient then reactivated and returned to the recovery room in satisfactory condition.  All instrument and sponge counts were correct. Dictated by:   Kerrin Champagne, M.D. Attending Physician:  Lubertha South DD:  05/19/01 TD:  05/21/01 Job: [id number]

## 2010-12-17 NOTE — Op Note (Signed)
NAME:  [name], [name]                        ACCOUNT NO.:  [id number]   MEDICAL RECORD NO.:  [id number]                   PATIENT TYPE:  INP   LOCATION:  0001                                 FACILITY:  [facility]   PHYSICIAN:  Marlowe Kays, M.D.               DATE OF BIRTH:  06/22/1933   DATE OF PROCEDURE:  06/18/2003  DATE OF DISCHARGE:                                 OPERATIVE REPORT   PREOPERATIVE DIAGNOSIS:  Osteoarthritis arthritis, right hip.   POSTOPERATIVE DIAGNOSIS:  Osteoarthritis arthritis, right hip.   OPERATION:  Osteonics total hip replacement right.   SURGEON:  Illene Labrador. [place], M.D.   ASSISTANT:  Georges Lynch. Darrelyn Hillock, M.D.   ANESTHESIA:  Spinal.   PATHOLOGY AND JUSTIFICATION FOR PROCEDURE:  She had bone-on-bone abutment in  the right hip with very painful arthritic hip.   DESCRIPTION OF PROCEDURE:  Satisfactory spinal anesthesia, prophylactic  antibiotics, Foley catheter inserted, left lateral decubitus position, the  right hip prepped with DuraPrep, draped in a sterile field, Mark II frame  utilized.  Posterolateral incision down through the fascia lata.  Zickel's  band was cut and the external rotators detached from the femur with cutting  cautery.  Subtotal capsulectomy was performed of the hip and the hip  dislocated.  I made my initial femoral neck cut just below the femoral head  and then cleared the piriformis fossa of soft tissue and placed a guidepin  down in the femoral canal through it, overdrilling it and then using the  canal finder.  We then began the cylindrical reaming process up to #8 and  along the way, used the greater trochanteric reamer and also made my final  neck cut a fingerbreadth proximal to the lesser trochanter.  Then used rasps  and went up to an 8.  At that point, there was a tight fit with no  cancellous bone left at the calcar.  We then reamed for the distal tip which  was 12 mm, so we had to ream up to 12.5 and then used the gold  broach to  complete the rasping.  Then went to the acetabulum, cleared it of soft  tissue and remaining capsules, also some osteophytes around the rim.  Then  began deepening and expanding reaming process working up to a 54.  We then  placed a final 56 cup which we did not initially screw in but went through a  trial reduction, and it was nice and stable.  Consequently, then drilled and  screwed two holes into the superior acetabulum and placed the final 10  degree cup with the scribe line at 11 o'clock.  We then placed the final #8  Secur-Fit femoral component, impacting it without any iatrogenic fracture.  We then went through a trial reduction and found that a plus 5 C-tapered  head was the length of choice, and we placed a final 32 mm plus 5 head,  impacting it, reducing the hip, and finding it to be nice and stable.  The  wound was then irrigated and closed.  Zickel's band was reapproximated with  interrupted #1 Vicryl as was the fascia lata, subcutaneous tissue with a  combination of #1 and 2-0 Vicryl, and the skin with staples.  Betadine,  Adaptic, dry sterile dressing were applied.  She was gently placed on her  back in an abduction pillow and taken to recovery room in satisfactory  condition with no known complications.  Estimated blood loss 150 mL.  No  blood replacement.                                               Marlowe Kays, M.D.    JA/MEDQ  D:  06/18/2003  T:  06/18/2003  Job:  [id number]

## 2011-01-05 ENCOUNTER — Other Ambulatory Visit: Payer: Self-pay | Admitting: Specialist

## 2011-01-05 ENCOUNTER — Other Ambulatory Visit: Payer: Self-pay | Admitting: Orthopedic Surgery

## 2011-01-05 DIAGNOSIS — M545 Low back pain: Secondary | ICD-10-CM

## 2011-01-09 ENCOUNTER — Ambulatory Visit
Admission: RE | Admit: 2011-01-09 | Discharge: 2011-01-09 | Disposition: A | Discharge status: Home / Self Care | Payer: Medicare Other | Source: Ambulatory Visit | Attending: Orthopedic Surgery | Admitting: Orthopedic Surgery

## 2011-01-09 DIAGNOSIS — M545 Low back pain: Secondary | ICD-10-CM

## 2011-01-18 ENCOUNTER — Ambulatory Visit
Admission: RE | Admit: 2011-01-18 | Discharge: 2011-01-18 | Disposition: A | Discharge status: Home / Self Care | Payer: Medicare Other | Source: Ambulatory Visit | Attending: Orthopedic Surgery | Admitting: Orthopedic Surgery

## 2011-01-18 ENCOUNTER — Other Ambulatory Visit: Payer: Self-pay | Admitting: Orthopedic Surgery

## 2011-01-18 DIAGNOSIS — B999 Unspecified infectious disease: Secondary | ICD-10-CM

## 2011-01-25 ENCOUNTER — Encounter (HOSPITAL_COMMUNITY)
Admission: RE | Admit: 2011-01-25 | Discharge: 2011-01-25 | Disposition: A | Discharge status: Home / Self Care | Payer: Medicare Other | Source: Ambulatory Visit | Attending: Orthopedic Surgery | Admitting: Orthopedic Surgery

## 2011-01-25 LAB — DIFFERENTIAL
Eosinophils Absolute: 0.2 10*3/uL (ref 0.0–0.7)
Eosinophils Relative: 1 % (ref 0–5)
Lymphs Abs: 3.4 10*3/uL (ref 0.7–4.0)
Monocytes Absolute: 1.2 10*3/uL — ABNORMAL HIGH (ref 0.1–1.0)
Monocytes Relative: 9 % (ref 3–12)

## 2011-01-25 LAB — CBC
MCH: 30.6 pg (ref 26.0–34.0)
MCHC: 34.8 g/dL (ref 30.0–36.0)
MCV: 88 fL (ref 78.0–100.0)
Platelets: 476 10*3/uL — ABNORMAL HIGH (ref 150–400)
RDW: 13.3 % (ref 11.5–15.5)

## 2011-01-25 LAB — BASIC METABOLIC PANEL
BUN: 17 mg/dL (ref 6–23)
Creatinine, Ser: 0.97 mg/dL (ref 0.50–1.10)
GFR calc non Af Amer: 56 mL/min — ABNORMAL LOW (ref >60)
Glucose, Bld: 132 mg/dL — ABNORMAL HIGH (ref 70–99)
Potassium: 3 mEq/L — ABNORMAL LOW (ref 3.5–5.1)

## 2011-01-25 LAB — URINALYSIS, ROUTINE W REFLEX MICROSCOPIC
Bilirubin Urine: NEGATIVE
Ketones, ur: NEGATIVE mg/dL
Nitrite: NEGATIVE
Urobilinogen, UA: 0.2 mg/dL (ref 0.0–1.0)
pH: 6.5 (ref 5.0–8.0)

## 2011-01-25 LAB — URINE MICROSCOPIC-ADD ON

## 2011-01-25 LAB — PROTIME-INR: Prothrombin Time: 14.1 seconds (ref 11.6–15.2)

## 2011-01-26 ENCOUNTER — Ambulatory Visit (HOSPITAL_COMMUNITY): Payer: Medicare Other

## 2011-01-26 ENCOUNTER — Inpatient Hospital Stay (HOSPITAL_COMMUNITY)
Admission: RE | Admit: 2011-01-26 | Discharge: 2011-01-28 | DRG: 517 | Disposition: A | Discharge status: Home / Self Care | Payer: Medicare Other | Source: Ambulatory Visit | Attending: Orthopedic Surgery | Admitting: Orthopedic Surgery

## 2011-01-26 DIAGNOSIS — Z96619 Presence of unspecified artificial shoulder joint: Secondary | ICD-10-CM

## 2011-01-26 DIAGNOSIS — Z888 Allergy status to other drugs, medicaments and biological substances status: Secondary | ICD-10-CM

## 2011-01-26 DIAGNOSIS — T8450XA Infection and inflammatory reaction due to unspecified internal joint prosthesis, initial encounter: Principal | ICD-10-CM | POA: Diagnosis present

## 2011-01-26 DIAGNOSIS — Z882 Allergy status to sulfonamides status: Secondary | ICD-10-CM

## 2011-01-26 DIAGNOSIS — F341 Dysthymic disorder: Secondary | ICD-10-CM | POA: Diagnosis present

## 2011-01-27 LAB — CBC
HCT: 33.3 % — ABNORMAL LOW (ref 36.0–46.0)
MCV: 87.6 fL (ref 78.0–100.0)
RDW: 13.4 % (ref 11.5–15.5)
WBC: 11.7 10*3/uL — ABNORMAL HIGH (ref 4.0–10.5)

## 2011-01-27 LAB — BASIC METABOLIC PANEL
BUN: 15 mg/dL (ref 6–23)
CO2: 29 mEq/L (ref 19–32)
Chloride: 95 mEq/L — ABNORMAL LOW (ref 96–112)
Creatinine, Ser: 0.89 mg/dL (ref 0.50–1.10)
GFR calc Af Amer: >60 mL/min (ref >60)

## 2011-01-28 LAB — CBC
MCH: 30 pg (ref 26.0–34.0)
MCV: 88.2 fL (ref 78.0–100.0)
Platelets: 353 10*3/uL (ref 150–400)
RDW: 13.7 % (ref 11.5–15.5)

## 2011-01-28 LAB — BASIC METABOLIC PANEL
Calcium: 8.7 mg/dL (ref 8.4–10.5)
Creatinine, Ser: 0.82 mg/dL (ref 0.50–1.10)
GFR calc Af Amer: >60 mL/min (ref >60)

## 2011-01-30 LAB — BODY FLUID CULTURE: Culture: NO GROWTH

## 2011-01-31 LAB — ANAEROBIC CULTURE

## 2011-02-18 NOTE — Op Note (Signed)
NAMEALUEL, SCHWARZ NO.:  [id number]  MEDICAL RECORD NO.:  [id number]  LOCATION:  5037                         FACILITY:  [facility]  PHYSICIAN:  Almedia Balls. Ranell Patrick, M.D. DATE OF BIRTH:  [birth year]  DATE OF PROCEDURE:  01/26/2011 DATE OF DISCHARGE:                              OPERATIVE REPORT   PREOPERATIVE DIAGNOSIS:  Right shoulder infection status post reverse total shoulder arthroplasty.  POSTOPERATIVE DIAGNOSIS:  Right shoulder infection status post reverse total shoulder arthroplasty.  PROCEDURE PERFORMED:  Right shoulder I and D and polyethylene exchange performed on January 26, 2011.  ATTENDING SURGEON:  Almedia Balls. Ranell Patrick, MD  FIRST ASSISTANT:  None.  ANESTHESIA:  General anesthesia was used plus interscalene block.  ESTIMATED BLOOD LOSS:  About 200 mL.  FLUID REPLACEMENT:  1000 mL crystalloid.  Instrument count was correct.  There were no complications. Perioperative antibiotics were given.  INDICATIONS:  The patient is a 77 year old female with a history of right shoulder revision reverse shoulder arthroplasty performed in December 2011.  The patient had done well initially with some pain, but no signs of obvious infection prior to a week ago.  The patient began having increasing pain and redness in her shoulder suspicious for infection.  She has had aspiration indicating white cells in her synovial fluid in the right shoulder suspicious for infection.  Based on the patient's redness, her clinical pain, slightly elevated white count and the presence of white cells but no organisms in her shoulder, we counseled the patient regarding recommendation for an I and D of her shoulder and polyethylene exchange.  The patient agreed to this. Informed consent was obtained.  DESCRIPTION OF PROCEDURE:  After adequate local anesthesia was achieved, the patient was positioned in the modified beach-chair position.  Right shoulder was sterilely prepped and  draped in the usual manner.  The patient's prior deltopectoral incision was utilized with a #10 blade scalpel.  Dissected down through the subcutaneous tissues using Bovie. The deltopectoral interval identified and opened up.  Elevated the deltoid off the humerus and did a soft tissue release with care taken towards trying not to injure the axillary nerve.  The conjoined tendon identified and freed up off the anterior scapula and then we were able to release off the inferior humeral neck and eventually through a lot of dissection and debridement, we were able to dislocate the shoulder.  We did get cultures.  There was some cloudy fluid in the shoulder and we got aerobic, anaerobic cultures and a routine Gram stain and next, we went ahead and performed a debridement using rongeurs and curettes.  We removed the polyethylene which was a 38 x 3 and then after 3 L of pulsatile irrigation and wiping down the metal surfaces, we went ahead and placed the 38 +6 polyethylene, impacted that into position, and reduced the shoulder.  We were happy with attention and then repaired the deltopectoral interval with #1 PDS suture followed by a full-thickness closure with 2-0 nylon suture with mattress stitches. After that, we placed a sterile compressive bandage, placed the patient in a sling.  She tolerated the procedure well.     Almedia Balls. Ranell Patrick, M.D.  SRN/MEDQ  D:  01/26/2011  T:  01/27/2011  Job:  [id number]  Electronically Signed by Malon Kindle  on 02/18/2011 12:07:17 AM

## 2011-02-18 NOTE — Discharge Summary (Signed)
  [name], [name] NO.:  [id number]  MEDICAL RECORD NO.:  [id number]  LOCATION:  5037                         FACILITY:  [facility]  PHYSICIAN:  Almedia Balls. Ranell Patrick, M.D. DATE OF BIRTH:  05-Jul-1933  DATE OF ADMISSION:  01/26/2011 DATE OF DISCHARGE:  01/28/2011                              DISCHARGE SUMMARY   ADMISSION DIAGNOSIS:  Right shoulder infection, status post reverse total shoulder arthroplasty.  DISCHARGE DIAGNOSIS:  Right shoulder infection, status post reverse total shoulder arthroplasty.  BRIEF HISTORY:  The patient is a 77 year old female with worsening right shoulder pain and erythema to that right shoulder causing concern for an infection.  This patient was admitted for surgical I and D, polyethylene exchange of that right shoulder.  PROCEDURE:  The patient had right shoulder I and D and polyethylene exchange by Dr. Malon Kindle on January 26, 2011.  No assistant, and general anesthesia was used.  No complications.  HOSPITAL COURSE:  The patient admitted on January 26, 2011, for the above- stated procedure which she tolerated well.  After adequate time in Post Anesthesia Care Unit, she was transferred up to 5000.  Postop day #1, the patient complained of moderate pain to that right shoulder, some mild nausea, otherwise doing okay.  She was given some IV antibiotics over the next couple of days, and after at least 48 hours of antibiotics, the patient was discharged home feeling somewhat better, but still having some moderate soreness, status post surgery. Neurologically, she was intact.  Otherwise, her hospital stay was uneventful.  DISCHARGE/PLAN:  The patient will be discharged home on January 28, 2011. Her condition is stable.  Her diet is regular.  FOLLOWUP:  The patient follow back up with Dr. Malon Kindle in 2 weeks.     Thomas B. Dixon, P.A.   ______________________________ Almedia Balls. Ranell Patrick, M.D.    TBD/MEDQ  D:  02/08/2011  T:   02/08/2011  Job:  [id number]  Electronically Signed by Standley Dakins P.A. on 02/17/2011 09:02:51 AM Electronically Signed by Malon Kindle  on 02/18/2011 12:07:24 AM

## 2011-03-31 ENCOUNTER — Encounter: Payer: Self-pay | Admitting: Internal Medicine

## 2011-03-31 ENCOUNTER — Ambulatory Visit (INDEPENDENT_AMBULATORY_CARE_PROVIDER_SITE_OTHER): Payer: Medicare Other | Admitting: Internal Medicine

## 2011-03-31 DIAGNOSIS — M797 Fibromyalgia: Secondary | ICD-10-CM | POA: Insufficient documentation

## 2011-03-31 DIAGNOSIS — IMO0001 Reserved for inherently not codable concepts without codable children: Secondary | ICD-10-CM

## 2011-03-31 DIAGNOSIS — E785 Hyperlipidemia, unspecified: Secondary | ICD-10-CM | POA: Insufficient documentation

## 2011-03-31 DIAGNOSIS — T8140XA Infection following a procedure, unspecified, initial encounter: Secondary | ICD-10-CM | POA: Insufficient documentation

## 2011-03-31 DIAGNOSIS — F329 Major depressive disorder, single episode, unspecified: Secondary | ICD-10-CM

## 2011-03-31 DIAGNOSIS — S42309A Unspecified fracture of shaft of humerus, unspecified arm, initial encounter for closed fracture: Secondary | ICD-10-CM

## 2011-03-31 DIAGNOSIS — I10 Essential (primary) hypertension: Secondary | ICD-10-CM | POA: Insufficient documentation

## 2011-03-31 DIAGNOSIS — M1990 Unspecified osteoarthritis, unspecified site: Secondary | ICD-10-CM | POA: Insufficient documentation

## 2011-03-31 NOTE — Assessment & Plan Note (Signed)
I certainly agree with concerns that she might have postoperative infection given the increased pain, swelling, and redness that she developed several months ago. Also the presence of cloudy fluid in the joint at the time of her last surgery raises concern. The negative operative Gram stain and culture could be falsely negative do to antibiotic therapy with clindamycin prior to surgery. I think it is best that she stay off of all antibiotics for the time being while I try to locate results of her MRI, the initial joint aspirate, and discuss this difficult situation with Dr. Ranell Patrick. If the prosthetic joint is indeed infected it is unlikely at this point that it would be curable without removal of the hardware and old cement. I will try to locate and review her other records and see her back within 2 weeks.

## 2011-03-31 NOTE — Progress Notes (Signed)
Subjective:    Patient ID: [name], female    DOB: 19-Apr-1933, 77 y.o.   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 Beverely Low for evaluation of possible right prosthetic shoulder infection. She has an extensive surgical history beginning in 2002 when she sustained a right humeral neck fracture. She underwent right shoulder hemiarthroplasty at that time. Her wounds healed nicely but she was left with persistent pain. She was referred to an orthopedist at [facility] in 2004 who did a revision hemiarthroplasty but the pain persisted. She underwent her third procedure in December of last year her room. Dr. Ranell Patrick did a reverse arthroplasty. Her pain persisted and 2-3 months after surgery she says she developed sudden worsening of her pain associated with swelling and redness over the incision. She states that she was referred to radiology and underwent MRI scanning and aspiration of the joint but I cannot locate the results of those studies yet. At some point around this time she was started on oral doxycycline but had to discontinue it because she developed a rash. She was switched to oral clindamycin 3 times daily and apparently did not improve. She was admitted to the hospital in late June and underwent incision and drainage and poly-exchange. Dr. Ranell Patrick' operative note indicates that he encountered cloudy fluid. The Gram stain and culture of that fluid were negative. She was discharged on oral clindamycin. Initially she was being dosed twice daily but then was increased back to 3 times daily. She states that the swelling improved after her last surgery and the redness receded slightly but never went away completely and she still has severe pain, often up to 10 out of 10 on a daily basis. She had to stop taking the clindamycin about 10 days ago because she felt bad having been on it so long and because she was worried that it  was causing her dry itchy skin. She has not been on any antibiotics since that time. She has not noticed any change in the redness or pain in her shoulder since stopping clindamycin. She has not had any fever, chills, or sweats.  I do not have any list of her medications and she is unsure of all of the doses. As best she can recall she is taking hydrochlorothiazide, atenolol, Norvasc, Prilosec, Prozac, Xanax, Zocor, and as needed tramadol, Robaxin and dilaudid.  Dr. Sigmund Hazel is her primary care physician with [facility] physicians. She is also followed by Dr. Vicki Mallet. She tells me that she has been seen him because she may have fibromyalgia and some other type of arthritis. She states that osteoarthritis, rheumatoid arthritis and gout have all been considered.    Review of Systems  Constitutional: Positive for fatigue. Negative for fever, chills, diaphoresis, appetite change and unexpected weight change.  Musculoskeletal: Positive for joint swelling and arthralgias.  Psychiatric/Behavioral: Positive for dysphoric mood.       Objective:   Physical Exam  Constitutional: She appears well-developed and well-nourished.       She is clearly frustrated.  HENT:  Mouth/Throat: Oropharynx is clear and moist. No oropharyngeal exudate.  Cardiovascular: Normal rate, regular rhythm and normal heart sounds.   No murmur heard. Pulmonary/Chest: Breath sounds normal. She has no wheezes. She has no rales.  Abdominal: Bowel sounds are normal. She exhibits no distension. There is no tenderness.  Musculoskeletal:       Her right shoulder incision  is well healed. There is no obvious swelling. There is a large area of bright red erythema medial to the incision just above her axilla it is slightly warm to touch but not tender.          Assessment & Plan:

## 2011-04-14 ENCOUNTER — Other Ambulatory Visit: Payer: Self-pay | Admitting: *Deleted

## 2011-04-14 ENCOUNTER — Ambulatory Visit (INDEPENDENT_AMBULATORY_CARE_PROVIDER_SITE_OTHER): Payer: Medicare Other | Admitting: Internal Medicine

## 2011-04-14 ENCOUNTER — Encounter: Payer: Self-pay | Admitting: Internal Medicine

## 2011-04-14 VITALS — BP 130/70 | HR 66 | Temp 98.0°F | Ht 61.5 in | Wt 155.0 lb

## 2011-04-14 DIAGNOSIS — N3001 Acute cystitis with hematuria: Secondary | ICD-10-CM | POA: Insufficient documentation

## 2011-04-14 DIAGNOSIS — R300 Dysuria: Secondary | ICD-10-CM

## 2011-04-14 DIAGNOSIS — Z0000 Encounter for general adult medical examination without abnormal findings: Secondary | ICD-10-CM

## 2011-04-14 DIAGNOSIS — Z23 Encounter for immunization: Secondary | ICD-10-CM

## 2011-04-14 DIAGNOSIS — T8140XA Infection following a procedure, unspecified, initial encounter: Secondary | ICD-10-CM

## 2011-04-14 LAB — URINALYSIS, ROUTINE W REFLEX MICROSCOPIC
Bilirubin Urine: NEGATIVE
Ketones, ur: NEGATIVE mg/dL
Protein, ur: NEGATIVE mg/dL
Specific Gravity, Urine: 1.014 (ref 1.005–1.030)
Urobilinogen, UA: 0.2 mg/dL (ref 0.0–1.0)

## 2011-04-14 LAB — URINALYSIS, MICROSCOPIC ONLY
Squamous Epithelial / LPF: NONE SEEN
WBC, UA: >50 WBC/hpf — AB (ref <3)

## 2011-04-14 LAB — C-REACTIVE PROTEIN: CRP: 0.5 mg/dL (ref <0.60)

## 2011-04-14 NOTE — Progress Notes (Signed)
  Subjective:    Patient ID: [name], female    DOB: [birth year], 77 y.o.   MRN: [id number]  HPI [name] is in with her husband in for her followup visit. She has now been off of clindamycin for 24 days. She and her husband have not noticed any difference in the erythema medial to her healed right shoulder incision. She has not had any change in her chronic pain. Her shoulder does not hurt when it is at her side and she is not using her arm. However it is quite painful when she tries to raise her arm or use it for any activity. However, this has not changed since her last visit.  Since her last visit I have done some investigation and found that she did have an aspirate of her right shoulder on June 19. She believes she had an MRI scan done that day but that there is no report of one and I believe that she was mistaken about this. There is no record of an MRI scan been ordered and Dr. Ranell Patrick' office. The aspirate it did reveal 3 cc of purulent fluid. The Gram stain, aerobic and anaerobic cultures were negative. She believes she was already on doxycycline at that time that aspirate was obtained.  Lab work done in Dr. Ranell Patrick' office in July showed a normal sedimentation rate of 11 and a normal C-reactive protein of 0.2. A CBC done in Dr. Wynonia Lawman office on August 20 showed a normal white blood cell count of 7.  She says that she has been off of the low-dose prednisone that she had taken in the past for her arthritis. She says she it was stopped because of the concern that she might have smoldering right shoulder infection. She feels like her arthritis symptoms are worse and she wants to know if she can restart it.  She also mentions a separate problem which is that she has had some dysuria and urgency for the past 3 days and wonders if she might have a bladder infection. She has not had any fever.  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    Review of Systems     Objective:   Physical Exam  Constitutional: She appears well-developed and well-nourished.       She is clearly frustrated.  HENT:  Mouth/Throat: Oropharynx is clear and moist. No oropharyngeal exudate.  Cardiovascular: Normal rate, regular rhythm and normal heart sounds.   No murmur heard. Pulmonary/Chest: Breath sounds normal. She has no wheezes. She has no rales.  Abdominal: Bowel sounds are normal. She exhibits no distension. There is no tenderness.  Musculoskeletal:       Her right shoulder incision is well healed. There is no obvious swelling. There is a large area of bright red erythema medial to the incision just above her axilla it is slightly warm to touch but not tender.  Her shoulder exam is unchanged since her visit a few weeks ago.          Assessment & Plan:

## 2011-04-14 NOTE — Assessment & Plan Note (Addendum)
I will check a clean catch urinalysis today and if abnormal will do a urine culture.  Addendum: Her UA showed pyuria and UC grew E.coli. I will treat her with Macrobid.

## 2011-04-14 NOTE — Assessment & Plan Note (Signed)
I do not see any clear evidence that she has active infection in her shoulder at this time. The erythema is stable and does not really have the appearance of active cellulitis. Unfortunately it is too early having only been off of antibiotics for 3-1/2 weeks to be certain. I will continue to observe her off of antibiotics and repeat her sedimentation rate and C-reactive protein today. I will see her back in about one month. She knows to call me if she has any evidence of worsening prior to that visit. If she shows any signs of recurrent infection I would recommend a repeat aspiration of the shoulder for Gram stain and culture prior to instituting any empiric antibiotics.  The decision about restarting prednisone is a process of choosing a lesser evil. I do not believe there is a right answer here in asked her to check with Dr. Hyacinth Meeker and Dr. Kellie Simmering load to get their opinion on whether or not the potential benefit outweighs any potential risk.

## 2011-04-16 LAB — URINE CULTURE: Colony Count: >=100000

## 2011-04-18 MED ORDER — NITROFURANTOIN MONOHYD MACRO 100 MG PO CAPS: 100.0000 mg | ORAL_CAPSULE | Freq: Two times a day (BID) | ORAL | Status: AC

## 2011-04-18 NOTE — Progress Notes (Signed)
Addended by: Cliffton Asters on: 04/18/2011 10:16 AM   Modules accepted: Orders

## 2011-04-19 ENCOUNTER — Telehealth: Payer: Self-pay | Admitting: *Deleted

## 2011-04-19 NOTE — Telephone Encounter (Signed)
Per pt TB was negative

## 2011-04-20 LAB — POCT I-STAT 4, (NA,K, GLUC, HGB,HCT)
Glucose, Bld: 94
HCT: 46
Hemoglobin: 15.6 — ABNORMAL HIGH
Sodium: 136

## 2011-04-26 ENCOUNTER — Encounter: Payer: Self-pay | Admitting: Internal Medicine

## 2011-05-09 ENCOUNTER — Ambulatory Visit (INDEPENDENT_AMBULATORY_CARE_PROVIDER_SITE_OTHER): Payer: Medicare Other | Admitting: Internal Medicine

## 2011-05-09 ENCOUNTER — Encounter: Payer: Self-pay | Admitting: Internal Medicine

## 2011-05-09 DIAGNOSIS — T8140XA Infection following a procedure, unspecified, initial encounter: Secondary | ICD-10-CM

## 2011-05-09 NOTE — Progress Notes (Signed)
  Subjective:    Patient ID: [name], female    DOB: 11-Nov-1932, 77 y.o.   MRN: [id number]  HPI Ms. Duignan is in for her routine visit. Her husband is with her today. She continues to have pain in her right shoulder but this is unchanged from previous exams. She feels like the redness over her anterior shoulder has gradually decreased. She has not had any fever, chills, or sweats.  At the time of her last visit she had dysuria and a urinalysis and urine culture revealed an Escherichia coli urinary tract infection. I treated her with a 3 day course of Macrobid and her symptoms resolved promptly.  Her PPD skin test applied her last visit was nonreactive.    Review of Systems     Objective:   Physical Exam  Musculoskeletal:       The redness over her anterior right shoulder has decreased slightly since her last visit. There is no swelling or warmth and her incision is well healed.          Assessment & Plan:

## 2011-05-09 NOTE — Assessment & Plan Note (Signed)
With the exception of a brief recent course of Macrobid she has been off of all antibiotics for 7 weeks now and I do not see any evidence of relapse of right shoulder infection. Her sedimentation rate was 7 and her C. reactive protein was 0.5 3 weeks ago. I do not see any need for further diagnostic testing for possible infection and will continue observation off of all antibiotics.

## 2011-05-27 LAB — TB SKIN TEST
Induration: 0
TB Skin Test: NEGATIVE mm

## 2011-06-29 ENCOUNTER — Encounter: Payer: Self-pay | Admitting: *Deleted

## 2011-07-07 ENCOUNTER — Emergency Department (HOSPITAL_COMMUNITY)
Admission: EM | Admit: 2011-07-07 | Discharge: 2011-07-08 | Disposition: A | Discharge status: Home / Self Care | Payer: Medicare Other | Attending: Emergency Medicine | Admitting: Emergency Medicine

## 2011-07-07 ENCOUNTER — Encounter: Payer: Self-pay | Admitting: Internal Medicine

## 2011-07-07 DIAGNOSIS — R55 Syncope and collapse: Secondary | ICD-10-CM | POA: Insufficient documentation

## 2011-07-07 DIAGNOSIS — S0990XA Unspecified injury of head, initial encounter: Secondary | ICD-10-CM | POA: Insufficient documentation

## 2011-07-07 DIAGNOSIS — M545 Low back pain, unspecified: Secondary | ICD-10-CM | POA: Insufficient documentation

## 2011-07-07 DIAGNOSIS — W19XXXA Unspecified fall, initial encounter: Secondary | ICD-10-CM

## 2011-07-07 DIAGNOSIS — E785 Hyperlipidemia, unspecified: Secondary | ICD-10-CM | POA: Insufficient documentation

## 2011-07-07 DIAGNOSIS — J3489 Other specified disorders of nose and nasal sinuses: Secondary | ICD-10-CM | POA: Insufficient documentation

## 2011-07-07 DIAGNOSIS — S0003XA Contusion of scalp, initial encounter: Secondary | ICD-10-CM | POA: Insufficient documentation

## 2011-07-07 DIAGNOSIS — R404 Transient alteration of awareness: Secondary | ICD-10-CM | POA: Insufficient documentation

## 2011-07-07 DIAGNOSIS — W108XXA Fall (on) (from) other stairs and steps, initial encounter: Secondary | ICD-10-CM | POA: Insufficient documentation

## 2011-07-07 DIAGNOSIS — M47812 Spondylosis without myelopathy or radiculopathy, cervical region: Secondary | ICD-10-CM | POA: Insufficient documentation

## 2011-07-07 DIAGNOSIS — I10 Essential (primary) hypertension: Secondary | ICD-10-CM | POA: Insufficient documentation

## 2011-07-07 NOTE — ED Notes (Signed)
Pt brought by EMS, pt from home, c/o fall and reports positive loss of LOC, noted hematoma to the back of her occipital area, left side, pt states pain present but did not rate at this time.

## 2011-07-07 NOTE — ED Notes (Signed)
JYN:[id number]<AO> Expected date:<BR> Expected time:<BR> Means of arrival:<BR> Comments:<BR> Ems/fall head injury

## 2011-07-08 ENCOUNTER — Emergency Department (HOSPITAL_COMMUNITY): Payer: Medicare Other

## 2011-07-08 ENCOUNTER — Encounter (HOSPITAL_COMMUNITY): Payer: Self-pay | Admitting: Emergency Medicine

## 2011-07-08 MED ORDER — IBUPROFEN 800 MG PO TABS: 800.0000 mg | ORAL_TABLET | Freq: Once | ORAL | Status: DC

## 2011-07-08 NOTE — ED Notes (Signed)
Family at b/s. EDP at b/s instructing pt and pt family

## 2011-07-08 NOTE — ED Provider Notes (Signed)
History     CSN: [id number] Arrival date & time: 07/07/2011 11:52 PM   First MD Initiated Contact with Patient 07/07/11 2357      Chief Complaint  Patient presents with  . Fall  . Loss of Consciousness    (Consider location/radiation/quality/duration/timing/severity/associated sxs/prior treatment) Patient is a 77 y.o. female presenting with fall and syncope. The history is provided by the patient and the spouse. No language interpreter was used.  Fall The accident occurred 1 to 2 hours ago. The fall occurred while walking. She fell from a height of 3 to 5 ft. She landed on carpet. There was no blood loss. The point of impact was the head. The pain is present in the head. The pain is at a severity of 1/10. The pain is mild. She was ambulatory at the scene. There was no entrapment after the fall. There was no drug use involved in the accident. There was no alcohol use involved in the accident. Pertinent negatives include no visual change, no fever, no numbness, no abdominal pain, no bowel incontinence, no nausea, no vomiting, no hematuria, no headaches, no hearing loss and no tingling. The symptoms are aggravated by activity. Prehospitalization: none. She has tried nothing for the symptoms. The treatment provided no relief.  Loss of Consciousness Pertinent negatives include no chest pain, no abdominal pain, no headaches and no shortness of breath.  Denies CDP, SOB, n/v/d.  No weakness or numbness.  No focal neuro deficits.  No dizziness.  Shoe came off and she fell back down 3 steps  Past Medical History  Diagnosis Date  . Asthma   . Depression   . GERD (gastroesophageal reflux disease)   . Hyperlipidemia   . Hypertension   . Arthritis     Past Surgical History  Procedure Date  . Joint replacement 2004    Right total hip arthroplasty  . Joint replacement 2006    Right total knee arthroplasty  . Carpal tunnel release 2007, 2009    Bilateral  . Shoulder surgery     Right shoulder  hemiarthroplasty 2002 after her right humeral neck fracture, revision hemiarthroplasty 2004 for persistent pain, revision reverse arthroplasty December 2011 for persistent pain, incision and drainage with poly-exchange 01/26/2011 for possible prosthetic joint infection.    History reviewed. No pertinent family history.  History  Substance Use Topics  . Smoking status: Never Smoker   . Smokeless tobacco: Never Used  . Alcohol Use: No    OB History    Grav Para Term Preterm Abortions TAB SAB Ect Mult Living                  Review of Systems  Constitutional: Negative for fever.  HENT: Negative for facial swelling and neck pain.   Eyes: Negative for discharge.  Respiratory: Negative for apnea, cough, chest tightness, shortness of breath and wheezing.   Cardiovascular: Positive for syncope. Negative for chest pain, palpitations and leg swelling.  Gastrointestinal: Negative for nausea, vomiting, abdominal pain, abdominal distention and bowel incontinence.  Genitourinary: Negative for hematuria and difficulty urinating.  Musculoskeletal: Negative for arthralgias.  Neurological: Negative for dizziness, tingling, tremors, seizures, syncope, weakness, numbness and headaches.  Hematological: Negative.   Psychiatric/Behavioral: Negative.     Allergies  Bee venom; Ciprofloxacin; Clindamycin/lincomycin; Gabapentin; Lamotrigine; Mirtazapine; Morphine; Oxycodone-acetaminophen; Pregabalin; Prochlorperazine edisylate; Pseudoephedrine; Sulfonamide derivatives; Telithromycin; Valsartan; Verapamil; and Doxycycline  Home Medications   Current Outpatient Rx  Name Route Sig Dispense Refill  . ALBUTEROL SULFATE HFA 108 (90 BASE)  MCG/ACT IN AERS Inhalation Inhale 2 puffs into the lungs every 4 (four) hours as needed. For shortness of breath.     . ALPRAZOLAM 1 MG PO TABS Oral Take 1 mg by mouth 4 (four) times daily as needed. For anxiety.    . AMLODIPINE BESYLATE 10 MG PO TABS Oral Take 10 mg by  mouth daily.      . ATENOLOL 25 MG PO TABS Oral Take 25 mg by mouth daily.     [name] CALCIUM CARBONATE-VITAMIN D 600-400 MG-UNIT PO TABS Oral Take 2 tablets by mouth daily.      . DULOXETINE HCL 30 MG PO CPEP Oral Take 30 mg by mouth daily.      [name] HYDROCHLOROTHIAZIDE 25 MG PO TABS Oral Take 25 mg by mouth daily.     [name] HYDROCODONE-ACETAMINOPHEN 7.5-325 MG PO TABS Oral Take 1-2 tablets by mouth every 4 (four) hours as needed. For pain.     [name] MECLIZINE HCL 25 MG PO TABS Oral Take 12.5-25 mg by mouth 3 (three) times daily as needed. For vertigo.     [name] METHOCARBAMOL 500 MG PO TABS Oral Take 500 mg by mouth every 8 (eight) hours as needed. For pain.    [name] OMEPRAZOLE 20 MG PO CPDR Oral Take 20 mg by mouth daily.     . OXYBUTYNIN CHLORIDE 5 MG PO TABS Oral Take 2.5 mg by mouth 2 (two) times daily.     [name] SIMVASTATIN 20 MG PO TABS Oral Take 20 mg by mouth daily.     . TRAMADOL HCL 50 MG PO TABS Oral Take 50-100 mg by mouth every 4 (four) hours as needed. For pain.    [name] ZOLEDRONIC ACID 5 MG/100ML IV SOLN Intravenous Inject 5 mg into the vein. Once yearly.      BP 126/70  Pulse 92  Temp(Src) 98.2 F (36.8 C) (Oral)  Resp 19  SpO2 94%  Physical Exam  Constitutional: She is oriented to person, place, and time. She appears well-developed and well-nourished. No distress.  HENT:  Right Ear: No mastoid tenderness. No hemotympanum.  Left Ear: No mastoid tenderness. No hemotympanum.  Mouth/Throat: Oropharynx is clear and moist.  Eyes: EOM are normal. Pupils are equal, round, and reactive to light. Right eye exhibits no discharge. Left eye exhibits no discharge.  Neck: No tracheal deviation present. No thyromegaly present.  Cardiovascular: Normal rate and regular rhythm.   Pulmonary/Chest: Effort normal and breath sounds normal. She exhibits no tenderness.  Abdominal: Soft. Bowel sounds are normal. There is no tenderness. There is no rebound and no guarding.  Musculoskeletal: Normal range of motion. She  exhibits no edema and no tenderness.  Neurological: She is alert and oriented to person, place, and time. She has normal reflexes. She displays normal reflexes. No cranial nerve deficit. Coordination normal.  Skin: Skin is warm and dry.  Psychiatric: Thought content normal.    ED Course  Procedures (including critical care time)  Labs Reviewed - No data to display Dg Chest 2 View  07/08/2011  *RADIOLOGY REPORT*  Clinical Data: Low back pain status post fall.  CHEST - 2 VIEW  Comparison: 07/27/2010 chest radiographs.  Findings: The heart now appears mildly enlarged, although this may be related to AP technique.  Mediastinal contours are stable.  The lungs are clear.  There is no pleural effusion or pneumothorax.  Right shoulder arthroplasty has been revised in the interval, now a reverse arthroplasty. The subacromial space of the left shoulder is  obliterated consistent with a chronic rotator cuff tear.  There is stable thoracic kyphosis.  No acute fractures are identified.  IMPRESSION: No acute cardiopulmonary process identified.  Interval enlargement of the cardiac silhouette may be related to AP technique.  Original Report Authenticated By: [name] Scale, M.D.   Dg Lumbar Spine Complete  07/08/2011  *RADIOLOGY REPORT*  Clinical Data: Low back pain status post fall.  LUMBAR SPINE - COMPLETE 4+ VIEW  Comparison: Lumbar MRI 01/09/2011.  Findings: There is a convex left thoracolumbar scoliosis. There is multilevel spondylosis with disc space loss and facet hypertrophy. A grade 1 anterolisthesis at L4-L5 is unchanged.  No acute fracture or pars defect is evident.  IMPRESSION: Stable lumbar spondylosis and alignment.  No acute osseous findings.  Original Report Authenticated By: [name] Scale, M.D.   Dg Pelvis 1-2 Views  07/08/2011  *RADIOLOGY REPORT*  Clinical Data: Status post fall; lower back pain.  PELVIS - 1-2 VIEW  Comparison: MRI of the lumbar spine performed 01/09/2011  Findings: There is  no evidence of fracture or dislocation.  The patient's right total hip arthroplasty appears grossly intact, though incompletely imaged on this study.  Both femoral heads are seated normally within their respective acetabula.  Mild degenerative change is noted along the lower lumbar spine.  The sacroiliac joints are unremarkable in appearance.  The visualized bowel gas pattern is grossly unremarkable in appearance.  IMPRESSION: No evidence of fracture or dislocation.  Original Report Authenticated By: Tonia Ghent, M.D.   Ct Head Wo Contrast  07/08/2011  *RADIOLOGY REPORT*  Clinical Data:  Status post fall with head injury and loss of consciousness.  CT HEAD WITHOUT CONTRAST CT CERVICAL SPINE WITHOUT CONTRAST  Technique:  Multidetector CT imaging of the head and cervical spine was performed following the standard protocol without intravenous contrast.  Multiplanar CT image reconstructions of the cervical spine were also generated.  Comparison:  Sinus CT 11/03/2006  CT HEAD  Findings: There is a large posterior right parietal scalp hematoma. No underlying calvarial fracture is demonstrated.  There is no evidence of acute intracranial hemorrhage, mass lesion, brain edema or extra-axial fluid collection.  There is no significant atrophy for age.  There is chronic lung disease with diffuse mucosal thickening and the osseous sclerosis.  These changes appear grossly stable.  The mastoids and middle ears are clear.  Torus palatinus is noted incidentally.  IMPRESSION:  1.  Large right parietal scalp hematoma. 2.  No acute intracranial or calvarial findings. 3.  Chronic paranasal sinus disease.  CT CERVICAL SPINE  Findings:  The cervical alignment is near anatomic.  There is a mild scoliosis.  Multilevel spondylosis is present with disc space loss, posterior osteophytes and facet hypertrophy.  No fracture or traumatic subluxation is identified.  There are no acute soft tissue findings.  IMPRESSION: No evidence of acute  cervical spine fracture, subluxation or static signs of instability.  Multilevel spondylosis.  Original Report Authenticated By: [name] Scale, M.D.   Ct Cervical Spine Wo Contrast  07/08/2011  *RADIOLOGY REPORT*  Clinical Data:  Status post fall with head injury and loss of consciousness.  CT HEAD WITHOUT CONTRAST CT CERVICAL SPINE WITHOUT CONTRAST  Technique:  Multidetector CT imaging of the head and cervical spine was performed following the standard protocol without intravenous contrast.  Multiplanar CT image reconstructions of the cervical spine were also generated.  Comparison:  Sinus CT 11/03/2006  CT HEAD  Findings: There is a large posterior right parietal scalp hematoma.  No underlying calvarial fracture is demonstrated.  There is no evidence of acute intracranial hemorrhage, mass lesion, brain edema or extra-axial fluid collection.  There is no significant atrophy for age.  There is chronic lung disease with diffuse mucosal thickening and the osseous sclerosis.  These changes appear grossly stable.  The mastoids and middle ears are clear.  Torus palatinus is noted incidentally.  IMPRESSION:  1.  Large right parietal scalp hematoma. 2.  No acute intracranial or calvarial findings. 3.  Chronic paranasal sinus disease.  CT CERVICAL SPINE  Findings:  The cervical alignment is near anatomic.  There is a mild scoliosis.  Multilevel spondylosis is present with disc space loss, posterior osteophytes and facet hypertrophy.  No fracture or traumatic subluxation is identified.  There are no acute soft tissue findings.  IMPRESSION: No evidence of acute cervical spine fracture, subluxation or static signs of instability.  Multilevel spondylosis.  Original Report Authenticated By: [name] Scale, M.D.     No diagnosis found.    MDM  MDM Number of Diagnoses or Management Options   Amount and/or Complexity of Data Reviewed Tests in the radiology section of CPT: reviewed and ordered Obtain history  from someone other than the patient: yes  Risk of Complications, Morbidity, and/or Mortality Presenting problems: moderate Diagnostic procedures: moderate          Meesha Sek K Jeyson Deshotel-Rasch, MD 07/08/11 [phone]

## 2011-07-08 NOTE — ED Notes (Signed)
Pt becoming more lethargic, states " i am scared, want to go home". Family at the bedside, pt is telling the family that she does not know what happened. Pt also asking where she is.

## 2011-07-08 NOTE — ED Notes (Signed)
While assessing the pt and helping her to use the bathroom, noted minimal amount of epistaxis

## 2011-08-04 DIAGNOSIS — G4700 Insomnia, unspecified: Secondary | ICD-10-CM | POA: Diagnosis not present

## 2011-08-04 DIAGNOSIS — M2550 Pain in unspecified joint: Secondary | ICD-10-CM | POA: Diagnosis not present

## 2011-08-04 DIAGNOSIS — M159 Polyosteoarthritis, unspecified: Secondary | ICD-10-CM | POA: Diagnosis not present

## 2011-08-08 DIAGNOSIS — F331 Major depressive disorder, recurrent, moderate: Secondary | ICD-10-CM | POA: Diagnosis not present

## 2011-08-08 DIAGNOSIS — F341 Dysthymic disorder: Secondary | ICD-10-CM | POA: Diagnosis not present

## 2011-08-11 DIAGNOSIS — F331 Major depressive disorder, recurrent, moderate: Secondary | ICD-10-CM | POA: Diagnosis not present

## 2011-08-16 DIAGNOSIS — F331 Major depressive disorder, recurrent, moderate: Secondary | ICD-10-CM | POA: Diagnosis not present

## 2011-08-16 DIAGNOSIS — F341 Dysthymic disorder: Secondary | ICD-10-CM | POA: Diagnosis not present

## 2011-08-22 DIAGNOSIS — F331 Major depressive disorder, recurrent, moderate: Secondary | ICD-10-CM | POA: Diagnosis not present

## 2011-08-23 DIAGNOSIS — R42 Dizziness and giddiness: Secondary | ICD-10-CM | POA: Diagnosis not present

## 2011-08-30 DIAGNOSIS — F331 Major depressive disorder, recurrent, moderate: Secondary | ICD-10-CM | POA: Diagnosis not present

## 2011-08-30 DIAGNOSIS — F341 Dysthymic disorder: Secondary | ICD-10-CM | POA: Diagnosis not present

## 2011-09-05 DIAGNOSIS — F331 Major depressive disorder, recurrent, moderate: Secondary | ICD-10-CM | POA: Diagnosis not present

## 2011-09-07 DIAGNOSIS — M25539 Pain in unspecified wrist: Secondary | ICD-10-CM | POA: Diagnosis not present

## 2011-09-07 DIAGNOSIS — M109 Gout, unspecified: Secondary | ICD-10-CM | POA: Diagnosis not present

## 2011-09-08 DIAGNOSIS — F341 Dysthymic disorder: Secondary | ICD-10-CM | POA: Diagnosis not present

## 2011-09-08 DIAGNOSIS — F331 Major depressive disorder, recurrent, moderate: Secondary | ICD-10-CM | POA: Diagnosis not present

## 2011-09-12 ENCOUNTER — Telehealth: Payer: Self-pay

## 2011-09-12 DIAGNOSIS — M129 Arthropathy, unspecified: Secondary | ICD-10-CM | POA: Diagnosis not present

## 2011-09-12 DIAGNOSIS — R197 Diarrhea, unspecified: Secondary | ICD-10-CM | POA: Diagnosis not present

## 2011-09-12 NOTE — Telephone Encounter (Signed)
.  [facility]    Pt  Sees dr Manfred Shirts to call here while dr Kellie Simmering is away,having trouble with medications re prednisone etc,please advise.  Best phone [phone]    brenda

## 2011-09-13 NOTE — Telephone Encounter (Signed)
Spoke with patient, went to her PCP regarding prednisone, and followed up with dr. Kellie Simmering.  Disregard msg.

## 2011-09-14 DIAGNOSIS — F329 Major depressive disorder, single episode, unspecified: Secondary | ICD-10-CM | POA: Diagnosis not present

## 2011-09-14 DIAGNOSIS — I10 Essential (primary) hypertension: Secondary | ICD-10-CM | POA: Diagnosis not present

## 2011-09-14 DIAGNOSIS — F411 Generalized anxiety disorder: Secondary | ICD-10-CM | POA: Diagnosis not present

## 2011-09-14 DIAGNOSIS — T380X5A Adverse effect of glucocorticoids and synthetic analogues, initial encounter: Secondary | ICD-10-CM | POA: Diagnosis not present

## 2011-09-14 DIAGNOSIS — F331 Major depressive disorder, recurrent, moderate: Secondary | ICD-10-CM | POA: Diagnosis not present

## 2011-09-14 DIAGNOSIS — R000 Tachycardia, unspecified: Secondary | ICD-10-CM | POA: Diagnosis not present

## 2011-09-15 ENCOUNTER — Other Ambulatory Visit: Payer: Self-pay | Admitting: *Deleted

## 2011-09-16 DIAGNOSIS — F411 Generalized anxiety disorder: Secondary | ICD-10-CM | POA: Diagnosis not present

## 2011-09-16 DIAGNOSIS — R51 Headache: Secondary | ICD-10-CM | POA: Diagnosis not present

## 2011-09-20 DIAGNOSIS — F411 Generalized anxiety disorder: Secondary | ICD-10-CM | POA: Diagnosis not present

## 2011-09-21 DIAGNOSIS — F331 Major depressive disorder, recurrent, moderate: Secondary | ICD-10-CM | POA: Diagnosis not present

## 2011-09-21 DIAGNOSIS — F0390 Unspecified dementia without behavioral disturbance: Secondary | ICD-10-CM | POA: Diagnosis not present

## 2011-09-22 ENCOUNTER — Emergency Department (HOSPITAL_COMMUNITY)
Admission: EM | Admit: 2011-09-22 | Discharge: 2011-09-23 | Disposition: A | Discharge status: Home Health | Payer: Medicare Other | Attending: Emergency Medicine | Admitting: Emergency Medicine

## 2011-09-22 DIAGNOSIS — Z96649 Presence of unspecified artificial hip joint: Secondary | ICD-10-CM | POA: Diagnosis not present

## 2011-09-22 DIAGNOSIS — M129 Arthropathy, unspecified: Secondary | ICD-10-CM | POA: Insufficient documentation

## 2011-09-22 DIAGNOSIS — I10 Essential (primary) hypertension: Secondary | ICD-10-CM | POA: Insufficient documentation

## 2011-09-22 DIAGNOSIS — J45909 Unspecified asthma, uncomplicated: Secondary | ICD-10-CM | POA: Diagnosis not present

## 2011-09-22 DIAGNOSIS — E785 Hyperlipidemia, unspecified: Secondary | ICD-10-CM | POA: Insufficient documentation

## 2011-09-22 DIAGNOSIS — F329 Major depressive disorder, single episode, unspecified: Secondary | ICD-10-CM | POA: Diagnosis not present

## 2011-09-22 DIAGNOSIS — Z96659 Presence of unspecified artificial knee joint: Secondary | ICD-10-CM | POA: Insufficient documentation

## 2011-09-22 DIAGNOSIS — K219 Gastro-esophageal reflux disease without esophagitis: Secondary | ICD-10-CM | POA: Insufficient documentation

## 2011-09-22 DIAGNOSIS — F419 Anxiety disorder, unspecified: Secondary | ICD-10-CM

## 2011-09-22 DIAGNOSIS — F411 Generalized anxiety disorder: Secondary | ICD-10-CM | POA: Diagnosis not present

## 2011-09-22 DIAGNOSIS — F3289 Other specified depressive episodes: Secondary | ICD-10-CM | POA: Insufficient documentation

## 2011-09-22 HISTORY — DX: Anxiety disorder, unspecified: F41.9

## 2011-09-22 LAB — DIFFERENTIAL
Basophils Absolute: 0 10*3/uL (ref 0.0–0.1)
Eosinophils Absolute: 0.3 10*3/uL (ref 0.0–0.7)
Eosinophils Relative: 2 % (ref 0–5)
Lymphocytes Relative: 33 % (ref 12–46)

## 2011-09-22 LAB — CBC
MCH: 29 pg (ref 26.0–34.0)
MCV: 87.3 fL (ref 78.0–100.0)
Platelets: 341 10*3/uL (ref 150–400)
RDW: 14.4 % (ref 11.5–15.5)
WBC: 11.1 10*3/uL — ABNORMAL HIGH (ref 4.0–10.5)

## 2011-09-22 LAB — COMPREHENSIVE METABOLIC PANEL
ALT: 11 U/L (ref 0–35)
AST: 14 U/L (ref 0–37)
Calcium: 10.1 mg/dL (ref 8.4–10.5)
Sodium: 142 mEq/L (ref 135–145)
Total Protein: 7.5 g/dL (ref 6.0–8.3)

## 2011-09-22 LAB — URINE MICROSCOPIC-ADD ON

## 2011-09-22 LAB — RAPID URINE DRUG SCREEN, HOSP PERFORMED
Amphetamines: NOT DETECTED
Benzodiazepines: POSITIVE — AB
Tetrahydrocannabinol: NOT DETECTED

## 2011-09-22 LAB — URINALYSIS, ROUTINE W REFLEX MICROSCOPIC
Bilirubin Urine: NEGATIVE
Glucose, UA: NEGATIVE mg/dL
Hgb urine dipstick: NEGATIVE
pH: 6 (ref 5.0–8.0)

## 2011-09-22 MED ORDER — LORAZEPAM 1 MG PO TABS
1.0000 mg | ORAL_TABLET | Freq: Once | ORAL | Status: AC
  Administered 2011-09-22: 1 mg via ORAL
  Filled 2011-09-22: qty 1

## 2011-09-22 MED ORDER — LORAZEPAM 1 MG PO TABS: 1.0000 mg | ORAL_TABLET | Freq: Three times a day (TID) | ORAL | Status: AC | PRN

## 2011-09-22 NOTE — ED Notes (Signed)
Act team at pt's bedside with family

## 2011-09-22 NOTE — ED Notes (Signed)
Pt  C/o of being anxious MD informed stated he or another practicioner would be back to assess pt. Pt and pt's family informed. Will continue to monitor.

## 2011-09-22 NOTE — ED Notes (Signed)
Patient wanded by security. 

## 2011-09-22 NOTE — ED Notes (Signed)
Pt states her anxiety is out of control. Has been in contact with her Psychiatrist and her PCP at [place] at [facility]. Has not been able to get relief.   Was evaluated in [place] ED last week but was deemed "unqualified" for psych admission. Was then referred to Dr. Dub Mikes and was prescribed Valium.

## 2011-09-22 NOTE — ED Notes (Signed)
Pt's husband spoke with me away from the pt. States that "she will just go on and on". States she has used the word "suicide" several times in the past week but doesn't think she was serious.

## 2011-09-22 NOTE — ED Provider Notes (Signed)
History     CSN: [id number]  Arrival date & time 09/22/11  1748   First MD Initiated Contact with Patient 09/22/11 2118      Chief Complaint  Patient presents with  . Anxiety    (Consider location/radiation/quality/duration/timing/severity/associated sxs/prior treatment) HPI Pt with longstanding anxiety with recent worsening. No recent event to trigger. No SI/HI.  Past Medical History  Diagnosis Date  . Asthma   . Depression   . GERD (gastroesophageal reflux disease)   . Hyperlipidemia   . Hypertension   . Arthritis     Past Surgical History  Procedure Date  . Joint replacement 2004    Right total hip arthroplasty  . Joint replacement 2006    Right total knee arthroplasty  . Carpal tunnel release 2007, 2009    Bilateral  . Shoulder surgery     Right shoulder hemiarthroplasty 2002 after her right humeral neck fracture, revision hemiarthroplasty 2004 for persistent pain, revision reverse arthroplasty December 2011 for persistent pain, incision and drainage with poly-exchange 01/26/2011 for possible prosthetic joint infection.    No family history on file.  History  Substance Use Topics  . Smoking status: Never Smoker   . Smokeless tobacco: Never Used  . Alcohol Use: No    OB History    Grav Para Term Preterm Abortions TAB SAB Ect Mult Living                  Review of Systems  Constitutional: Negative for fever and chills.  Respiratory: Negative for shortness of breath.   Cardiovascular: Negative for chest pain.  Gastrointestinal: Negative for nausea, vomiting and abdominal pain.  Skin: Negative for color change, pallor and rash.  Neurological: Negative for dizziness, weakness, numbness and headaches.  Psychiatric/Behavioral: Negative for suicidal ideas. The patient is nervous/anxious.     Allergies  Bee venom; Ciprofloxacin; Clindamycin/lincomycin; Gabapentin; Lamotrigine; Mirtazapine; Morphine; Oxycodone-acetaminophen; Pregabalin; Prochlorperazine  edisylate; Pseudoephedrine; Sulfonamide derivatives; Telithromycin; Valsartan; Verapamil; and Doxycycline  Home Medications   Current Outpatient Rx  Name Route Sig Dispense Refill  . ALBUTEROL SULFATE HFA 108 (90 BASE) MCG/ACT IN AERS Inhalation Inhale 2 puffs into the lungs every 4 (four) hours as needed. For shortness of breath.     . AMLODIPINE BESYLATE 10 MG PO TABS Oral Take 10 mg by mouth daily.      . ATENOLOL 25 MG PO TABS Oral Take 25 mg by mouth daily.     [name] CALCIUM CARBONATE-VITAMIN D 600-400 MG-UNIT PO TABS Oral Take 2 tablets by mouth daily.      [name] DIAZEPAM 5 MG PO TABS Oral Take 5 mg by mouth every 8 (eight) hours as needed. For nerves    . DIPHENHYDRAMINE HCL 25 MG PO TABS Oral Take 25 mg by mouth every 6 (six) hours as needed. For allergies    . HYDROCHLOROTHIAZIDE 25 MG PO TABS Oral Take 25 mg by mouth daily.     [name] HYDROXYZINE PAMOATE 50 MG PO CAPS Oral Take 50 mg by mouth 2 (two) times daily.    [name] OMEPRAZOLE 20 MG PO CPDR Oral Take 20 mg by mouth daily.     [name] SIMVASTATIN 20 MG PO TABS Oral Take 20 mg by mouth daily.     [name] LORAZEPAM 1 MG PO TABS Oral Take 1 tablet (1 mg total) by mouth 3 (three) times daily as needed for anxiety. 5 tablet 0  . ZOLEDRONIC ACID 5 MG/100ML IV SOLN Intravenous Inject 5 mg into the vein. Once yearly.  In april      BP 141/73  Pulse 112  Temp(Src) 98.2 F (36.8 C) (Oral)  SpO2 97%  Physical Exam  Nursing note and vitals reviewed. Constitutional: She is oriented to person, place, and time. She appears well-developed and well-nourished. No distress.  HENT:  Head: Normocephalic and atraumatic.  Mouth/Throat: Oropharynx is clear and moist.  Eyes: EOM are normal. Pupils are equal, round, and reactive to light.  Neck: Normal range of motion. Neck supple.  Cardiovascular: Normal rate and regular rhythm.   Pulmonary/Chest: Effort normal and breath sounds normal. No respiratory distress. She has no wheezes. She has no rales.  Abdominal: Soft.  Bowel sounds are normal.  Musculoskeletal: Normal range of motion. She exhibits no edema and no tenderness.  Neurological: She is alert and oriented to person, place, and time.  Skin: Skin is warm and dry. No rash noted. No erythema.  Psychiatric: Her mood appears anxious.    ED Course  Procedures (including critical care time)  Labs Reviewed  URINALYSIS, ROUTINE W REFLEX MICROSCOPIC - Abnormal; Notable for the following:    Ketones, ur TRACE (*)    Leukocytes, UA MODERATE (*)    All other components within normal limits  URINE RAPID DRUG SCREEN (HOSP PERFORMED) - Abnormal; Notable for the following:    Benzodiazepines POSITIVE (*)    All other components within normal limits  CBC - Abnormal; Notable for the following:    WBC 11.1 (*)    All other components within normal limits  COMPREHENSIVE METABOLIC PANEL - Abnormal; Notable for the following:    Potassium 3.3 (*)    Glucose, Bld 114 (*)    Total Bilirubin 0.2 (*)    GFR calc non Af Amer 69 (*)    GFR calc Af Amer 80 (*)    All other components within normal limits  DIFFERENTIAL  ETHANOL  URINE MICROSCOPIC-ADD ON   No results found.   1. Anxiety       MDM    Eval'd by [facility]. Does not meet inpt requirements. Resources given. D/C home        Loren Racer, MD 09/22/11 [phone]

## 2011-09-22 NOTE — Discharge Instructions (Signed)

## 2011-09-23 ENCOUNTER — Encounter (HOSPITAL_COMMUNITY): Payer: Self-pay | Admitting: *Deleted

## 2011-09-23 NOTE — BH Assessment (Signed)
Assessment Note   [name] is a 78 y.o. female who presents to [facility] with severe anxiety, tearful.  Pt denies SI/HI/Psych.  Pt says xanax is no longer working.  Pt is requesting med change and new psych/therpaist.  Pt reports that she has had many health problems over the last several yrs and recent fall in which she thought she was going to die when fell to the bottom of steps.  Pt says anything can trigger a panic attack, she has them daily. This Clinical research associate provided outpatient referrals and pt. D/c'd home with spouse.   Axis I: Anxiety Disorder NOS Axis II: Deferred Axis III:  Past Medical History  Diagnosis Date  . Asthma   . Depression   . GERD (gastroesophageal reflux disease)   . Hyperlipidemia   . Hypertension   . Arthritis   . Anxiety    Axis IV: other psychosocial or environmental problems Axis V: 51-60 moderate symptoms  Past Medical History:  Past Medical History  Diagnosis Date  . Asthma   . Depression   . GERD (gastroesophageal reflux disease)   . Hyperlipidemia   . Hypertension   . Arthritis   . Anxiety     Past Surgical History  Procedure Date  . Joint replacement 2004    Right total hip arthroplasty  . Joint replacement 2006    Right total knee arthroplasty  . Carpal tunnel release 2007, 2009    Bilateral  . Shoulder surgery     Right shoulder hemiarthroplasty 2002 after her right humeral neck fracture, revision hemiarthroplasty 2004 for persistent pain, revision reverse arthroplasty December 2011 for persistent pain, incision and drainage with poly-exchange 01/26/2011 for possible prosthetic joint infection.    Family History: No family history on file.  Social History:  reports that she has never smoked. She has never used smokeless tobacco. She reports that she does not drink alcohol or use illicit drugs.  Additional Social History:  Alcohol / Drug Use Pain Medications: None  Prescriptions: None  Over the Counter: None  History of alcohol /  drug use?: No history of alcohol / drug abuse Longest period of sobriety (when/how long): None  Allergies:  Allergies  Allergen Reactions  . Bee Venom   . Ciprofloxacin   . Clindamycin/Lincomycin     Dry skin and itching  . Gabapentin   . Lamotrigine   . Mirtazapine   . Morphine   . Oxycodone-Acetaminophen   . Pregabalin   . Prochlorperazine Edisylate   . Pseudoephedrine   . Sulfonamide Derivatives   . Telithromycin   . Valsartan   . Verapamil   . Doxycycline Rash    Home Medications:  Medications Prior to Admission  Medication Sig Dispense Refill  . albuterol (PROVENTIL HFA;VENTOLIN HFA) 108 (90 BASE) MCG/ACT inhaler Inhale 2 puffs into the lungs every 4 (four) hours as needed. For shortness of breath.       [name] amLODipine (NORVASC) 10 MG tablet Take 10 mg by mouth daily.        [name] atenolol (TENORMIN) 25 MG tablet Take 25 mg by mouth daily.       . Calcium Carbonate-Vitamin D (CALCIUM 600+D HIGH POTENCY) 600-400 MG-UNIT per tablet Take 2 tablets by mouth daily.        . hydrochlorothiazide 25 MG tablet Take 25 mg by mouth daily.       [name] omeprazole (PRILOSEC) 20 MG capsule Take 20 mg by mouth daily.       . simvastatin (  ZOCOR) 20 MG tablet Take 20 mg by mouth daily.       . zoledronic acid (RECLAST) 5 MG/100ML SOLN Inject 5 mg into the vein. Once yearly. In april       Medications Prior to Admission  Medication Dose Route Frequency Provider Last Rate Last Dose  . LORazepam (ATIVAN) tablet 1 mg  1 mg Oral Once Loren Racer, MD   1 mg at 09/22/11 2148    OB/GYN Status:  No LMP recorded. Patient is postmenopausal.  General Assessment Data Location of Assessment: [facility] ED Living Arrangements: Spouse/significant other Can pt return to current living arrangement?: Yes Admission Status: Voluntary Is patient capable of signing voluntary admission?: Yes Transfer from: Acute Hospital Referral Source: MD  Education Status Is patient currently in school?: No Current Grade: None   Highest grade of school patient has completed: Unk  Name of school: [employer]  Contact person: None   Risk to self Suicidal Ideation: No Suicidal Intent: No Is patient at risk for suicide?: No Suicidal Plan?: No Access to Means: No What has been your use of drugs/alcohol within the last 12 months?: None  Previous Attempts/Gestures: No How many times?: 0  Other Self Harm Risks: None  Triggers for Past Attempts: None known Intentional Self Injurious Behavior: None Family Suicide History: No Recent stressful life event(s): Other (Comment) (Health Problems ) Persecutory voices/beliefs?: No Depression: No Substance abuse history and/or treatment for substance abuse?: No Suicide prevention information given to non-admitted patients: Not applicable  Risk to Others Homicidal Ideation: No Thoughts of Harm to Others: No Current Homicidal Intent: No Current Homicidal Plan: No Access to Homicidal Means: No Identified Victim: None  History of harm to others?: No Assessment of Violence: None Noted Violent Behavior Description: None  Does patient have access to weapons?: No Criminal Charges Pending?: No Does patient have a court date: No  Psychosis Hallucinations: None noted Delusions: None noted  Mental Status Report Appear/Hygiene: Other (Comment) (Appropriate ) Eye Contact: Good Motor Activity: Unremarkable Speech: Logical/coherent Level of Consciousness: Alert Mood: Anxious Affect: Anxious Anxiety Level: Moderate Thought Processes: Coherent;Relevant Judgement: Unimpaired Orientation: Person;Place;Time;Situation Obsessive Compulsive Thoughts/Behaviors: None  Cognitive Functioning Concentration: Normal Memory: Recent Intact;Remote Intact IQ: Average Insight: Good Impulse Control: Good Appetite: Good Weight Loss: 0  Weight Gain: 0  Sleep: No Change Total Hours of Sleep: 8  Vegetative Symptoms: None  Prior Inpatient Therapy Prior Inpatient Therapy: Yes Prior Therapy  Dates: 80's Prior Therapy Facilty/Provider(s): [facility]  Reason for Treatment: Anxiety   Prior Outpatient Therapy Prior Outpatient Therapy: Yes Prior Therapy Dates: Current  Prior Therapy Facilty/Provider(s): Dr. Joanette Gula  Reason for Treatment: Meg Mgt; Therapy   ADL Screening (condition at time of admission) Patient's cognitive ability adequate to safely complete daily activities?: Yes Patient able to express need for assistance with ADLs?: Yes Independently performs ADLs?: Yes Communication: Independent Dressing (OT): Independent Grooming: Independent Feeding: Independent Bathing: Independent Toileting: Independent In/Out Bed: Independent Walks in Home: Independent Weakness of Legs: None Weakness of Arms/Hands: None       Abuse/Neglect Assessment (Assessment to be complete while patient is alone) Physical Abuse: Denies Verbal Abuse: Denies Sexual Abuse: Denies Exploitation of patient/patient's resources: Denies Self-Neglect: Denies Values / Beliefs Cultural Requests During Hospitalization: None Spiritual Requests During Hospitalization: None Consults Spiritual Care Consult Needed: No Social Work Consult Needed: No Merchant navy officer (For Healthcare) Advance Directive: Patient does not have advance directive;Patient would not like information Pre-existing out of facility DNR order (yellow form or pink MOST form):  No    Additional Information 1:1 In Past 12 Months?: No CIRT Risk: No Elopement Risk: No Does patient have medical clearance?: Yes     Disposition:  Disposition Disposition of Patient: Outpatient treatment Type of outpatient treatment:  ([facility] )  On Site Evaluation by:   Reviewed with Physician:     Murrell Redden 09/23/2011 12:05 AM

## 2011-09-26 DIAGNOSIS — R197 Diarrhea, unspecified: Secondary | ICD-10-CM | POA: Diagnosis not present

## 2011-09-27 DIAGNOSIS — F331 Major depressive disorder, recurrent, moderate: Secondary | ICD-10-CM | POA: Diagnosis not present

## 2011-10-05 ENCOUNTER — Ambulatory Visit (HOSPITAL_COMMUNITY): Payer: Medicare Other | Admitting: Licensed Clinical Social Worker

## 2011-10-05 DIAGNOSIS — F331 Major depressive disorder, recurrent, moderate: Secondary | ICD-10-CM | POA: Diagnosis not present

## 2011-10-07 DIAGNOSIS — M064 Inflammatory polyarthropathy: Secondary | ICD-10-CM | POA: Diagnosis not present

## 2011-10-07 DIAGNOSIS — R5381 Other malaise: Secondary | ICD-10-CM | POA: Diagnosis not present

## 2011-10-07 DIAGNOSIS — M2550 Pain in unspecified joint: Secondary | ICD-10-CM | POA: Diagnosis not present

## 2011-10-07 DIAGNOSIS — M109 Gout, unspecified: Secondary | ICD-10-CM | POA: Diagnosis not present

## 2011-10-19 DIAGNOSIS — F331 Major depressive disorder, recurrent, moderate: Secondary | ICD-10-CM | POA: Diagnosis not present

## 2011-10-20 DIAGNOSIS — F341 Dysthymic disorder: Secondary | ICD-10-CM | POA: Diagnosis not present

## 2011-10-20 DIAGNOSIS — F331 Major depressive disorder, recurrent, moderate: Secondary | ICD-10-CM | POA: Diagnosis not present

## 2011-11-02 DIAGNOSIS — M2550 Pain in unspecified joint: Secondary | ICD-10-CM | POA: Diagnosis not present

## 2011-11-02 DIAGNOSIS — M1990 Unspecified osteoarthritis, unspecified site: Secondary | ICD-10-CM | POA: Diagnosis not present

## 2011-11-02 DIAGNOSIS — Z79899 Other long term (current) drug therapy: Secondary | ICD-10-CM | POA: Diagnosis not present

## 2011-11-02 DIAGNOSIS — M069 Rheumatoid arthritis, unspecified: Secondary | ICD-10-CM | POA: Diagnosis not present

## 2011-11-03 DIAGNOSIS — F331 Major depressive disorder, recurrent, moderate: Secondary | ICD-10-CM | POA: Diagnosis not present

## 2011-11-16 DIAGNOSIS — M545 Low back pain: Secondary | ICD-10-CM | POA: Diagnosis not present

## 2011-11-23 DIAGNOSIS — F341 Dysthymic disorder: Secondary | ICD-10-CM | POA: Diagnosis not present

## 2011-11-23 DIAGNOSIS — F331 Major depressive disorder, recurrent, moderate: Secondary | ICD-10-CM | POA: Diagnosis not present

## 2011-11-29 DIAGNOSIS — Z79899 Other long term (current) drug therapy: Secondary | ICD-10-CM | POA: Diagnosis not present

## 2011-11-29 DIAGNOSIS — IMO0001 Reserved for inherently not codable concepts without codable children: Secondary | ICD-10-CM | POA: Diagnosis not present

## 2011-11-29 DIAGNOSIS — M069 Rheumatoid arthritis, unspecified: Secondary | ICD-10-CM | POA: Diagnosis not present

## 2011-11-29 DIAGNOSIS — M2550 Pain in unspecified joint: Secondary | ICD-10-CM | POA: Diagnosis not present

## 2011-11-29 DIAGNOSIS — M1990 Unspecified osteoarthritis, unspecified site: Secondary | ICD-10-CM | POA: Diagnosis not present

## 2011-12-05 DIAGNOSIS — F331 Major depressive disorder, recurrent, moderate: Secondary | ICD-10-CM | POA: Diagnosis not present

## 2011-12-05 DIAGNOSIS — F341 Dysthymic disorder: Secondary | ICD-10-CM | POA: Diagnosis not present

## 2011-12-09 DIAGNOSIS — M545 Low back pain: Secondary | ICD-10-CM | POA: Diagnosis not present

## 2011-12-13 ENCOUNTER — Encounter (HOSPITAL_COMMUNITY)
Admission: RE | Admit: 2011-12-13 | Discharge: 2011-12-13 | Disposition: A | Discharge status: Home / Self Care | Payer: Medicare Other | Source: Ambulatory Visit | Attending: Family Medicine | Admitting: Family Medicine

## 2011-12-13 ENCOUNTER — Encounter (HOSPITAL_COMMUNITY): Payer: Self-pay

## 2011-12-13 DIAGNOSIS — M899 Disorder of bone, unspecified: Secondary | ICD-10-CM | POA: Insufficient documentation

## 2011-12-13 DIAGNOSIS — M949 Disorder of cartilage, unspecified: Secondary | ICD-10-CM | POA: Insufficient documentation

## 2011-12-13 DIAGNOSIS — IMO0002 Reserved for concepts with insufficient information to code with codable children: Secondary | ICD-10-CM | POA: Diagnosis not present

## 2011-12-13 HISTORY — DX: Other specified disorders of bone density and structure, unspecified site: M85.80

## 2011-12-13 MED ORDER — SODIUM CHLORIDE 0.9 % IV SOLN
INTRAVENOUS | Status: AC
  Administered 2011-12-13: 12:00:00 via INTRAVENOUS

## 2011-12-13 MED ORDER — ZOLEDRONIC ACID 5 MG/100ML IV SOLN
5.0000 mg | Freq: Once | INTRAVENOUS | Status: AC
  Administered 2011-12-13: 5 mg via INTRAVENOUS
  Filled 2011-12-13: qty 100

## 2011-12-13 NOTE — Discharge Instructions (Signed)
Drink  Fluids/water as tolerated over the next 72 hours Tylenol or ibuprofen OTC as directed Continue Calcium and Vit D as directed by your MD   RECLAST ZOLEDRONIC ACID (ZOE le dron ik AS id) lowers the amount of calcium loss from bone. It is used to treat Paget's disease and osteoporosis in women. This medicine may be used for other purposes; ask your health care provider or pharmacist if you have questions. What should I tell my health care provider before I take this medicine? They need to know if you have any of these conditions: -aspirin-sensitive asthma -dental disease -kidney disease -low levels of calcium in the blood -past surgery on the parathyroid gland or intestines -an unusual or allergic reaction to zoledronic acid, other medicines, foods, dyes, or preservatives -pregnant or trying to get pregnant -breast-feeding How should I use this medicine? This medicine is for infusion into a vein. It is given by a health care professional in a hospital or clinic setting. Talk to your pediatrician regarding the use of this medicine in children. This medicine is not approved for use in children. Overdosage: If you think you have taken too much of this medicine contact a poison control center or emergency room at once. NOTE: This medicine is only for you. Do not share this medicine with others. What if I miss a dose? It is important not to miss your dose. Call your doctor or health care professional if you are unable to keep an appointment. What may interact with this medicine? -certain antibiotics given by injection -NSAIDs, medicines for pain and inflammation, like ibuprofen or naproxen -some diuretics like bumetanide, furosemide -teriparatide This list may not describe all possible interactions. Give your health care provider a list of all the medicines, herbs, non-prescription drugs, or dietary supplements you use. Also tell them if you smoke, drink alcohol, or use illegal drugs.  Some items may interact with your medicine. What should I watch for while using this medicine? Visit your doctor or health care professional for regular checkups. It may be some time before you see the benefit from this medicine. Do not stop taking your medicine unless your doctor tells you to. Your doctor may order blood tests or other tests to see how you are doing. Women should inform their doctor if they wish to become pregnant or think they might be pregnant. There is a potential for serious side effects to an unborn child. Talk to your health care professional or pharmacist for more information. You should make sure that you get enough calcium and vitamin D while you are taking this medicine. Discuss the foods you eat and the vitamins you take with your health care professional. Some people who take this medicine have severe bone, joint, and/or muscle pain. This medicine may also increase your risk for a broken thigh bone. Tell your doctor right away if you have pain in your upper leg or groin. Tell your doctor if you have any pain that does not go away or that gets worse. What side effects may I notice from receiving this medicine? Side effects that you should report to your doctor or health care professional as soon as possible: -allergic reactions like skin rash, itching or hives, swelling of the face, lips, or tongue -breathing problems -changes in vision -feeling faint or lightheaded, falls -jaw burning, cramping, or pain -muscle cramps, stiffness, or weakness -trouble passing urine or change in the amount of urine Side effects that usually do not require medical attention (report  to your doctor or health care professional if they continue or are bothersome): -bone, joint, or muscle pain -fever -irritation at site where injected -loss of appetite -nausea, vomiting -stomach upset -tired This list may not describe all possible side effects. Call your doctor for medical advice about  side effects. You may report side effects to FDA at 1-800-FDA-1088. Where should I keep my medicine? This drug is given in a hospital or clinic and will not be stored at home. NOTE: This sheet is a summary. It may not cover all possible information. If you have questions about this medicine, talk to your doctor, pharmacist, or health care provider.  2012, [employer]. (01/14/2011 9:08:15 AM)

## 2011-12-19 ENCOUNTER — Ambulatory Visit: Payer: Medicare Other | Attending: Family Medicine | Admitting: Physical Therapy

## 2011-12-19 DIAGNOSIS — IMO0001 Reserved for inherently not codable concepts without codable children: Secondary | ICD-10-CM | POA: Insufficient documentation

## 2011-12-19 DIAGNOSIS — M545 Low back pain, unspecified: Secondary | ICD-10-CM | POA: Insufficient documentation

## 2011-12-22 ENCOUNTER — Ambulatory Visit: Payer: Medicare Other | Admitting: Physical Therapy

## 2011-12-22 DIAGNOSIS — IMO0001 Reserved for inherently not codable concepts without codable children: Secondary | ICD-10-CM | POA: Diagnosis not present

## 2011-12-22 DIAGNOSIS — M545 Low back pain: Secondary | ICD-10-CM | POA: Diagnosis not present

## 2011-12-27 ENCOUNTER — Ambulatory Visit: Payer: Medicare Other

## 2011-12-27 DIAGNOSIS — IMO0001 Reserved for inherently not codable concepts without codable children: Secondary | ICD-10-CM | POA: Diagnosis not present

## 2011-12-27 DIAGNOSIS — M545 Low back pain: Secondary | ICD-10-CM | POA: Diagnosis not present

## 2011-12-27 DIAGNOSIS — E7800 Pure hypercholesterolemia, unspecified: Secondary | ICD-10-CM | POA: Diagnosis not present

## 2011-12-29 ENCOUNTER — Ambulatory Visit: Payer: Medicare Other | Admitting: Physical Therapy

## 2011-12-29 DIAGNOSIS — M545 Low back pain: Secondary | ICD-10-CM | POA: Diagnosis not present

## 2011-12-29 DIAGNOSIS — IMO0001 Reserved for inherently not codable concepts without codable children: Secondary | ICD-10-CM | POA: Diagnosis not present

## 2012-01-02 ENCOUNTER — Ambulatory Visit: Payer: Medicare Other | Attending: Family Medicine | Admitting: Physical Therapy

## 2012-01-02 DIAGNOSIS — IMO0001 Reserved for inherently not codable concepts without codable children: Secondary | ICD-10-CM | POA: Diagnosis not present

## 2012-01-05 ENCOUNTER — Ambulatory Visit: Payer: Medicare Other | Admitting: Physical Therapy

## 2012-01-06 DIAGNOSIS — IMO0002 Reserved for concepts with insufficient information to code with codable children: Secondary | ICD-10-CM | POA: Diagnosis not present

## 2012-01-07 DIAGNOSIS — M5137 Other intervertebral disc degeneration, lumbosacral region: Secondary | ICD-10-CM | POA: Diagnosis not present

## 2012-01-09 ENCOUNTER — Ambulatory Visit: Payer: Medicare Other | Admitting: Physical Therapy

## 2012-01-10 DIAGNOSIS — S32009A Unspecified fracture of unspecified lumbar vertebra, initial encounter for closed fracture: Secondary | ICD-10-CM | POA: Diagnosis not present

## 2012-01-10 DIAGNOSIS — M545 Low back pain: Secondary | ICD-10-CM | POA: Diagnosis not present

## 2012-01-12 ENCOUNTER — Encounter: Payer: Medicare Other | Admitting: Physical Therapy

## 2012-01-17 ENCOUNTER — Encounter: Payer: Medicare Other | Admitting: Physical Therapy

## 2012-01-19 ENCOUNTER — Ambulatory Visit: Payer: Medicare Other

## 2012-01-20 DIAGNOSIS — S32009A Unspecified fracture of unspecified lumbar vertebra, initial encounter for closed fracture: Secondary | ICD-10-CM | POA: Diagnosis not present

## 2012-01-26 DIAGNOSIS — T148XXA Other injury of unspecified body region, initial encounter: Secondary | ICD-10-CM | POA: Diagnosis not present

## 2012-01-26 DIAGNOSIS — IMO0001 Reserved for inherently not codable concepts without codable children: Secondary | ICD-10-CM | POA: Diagnosis not present

## 2012-01-26 DIAGNOSIS — M2550 Pain in unspecified joint: Secondary | ICD-10-CM | POA: Diagnosis not present

## 2012-01-26 DIAGNOSIS — Z79899 Other long term (current) drug therapy: Secondary | ICD-10-CM | POA: Diagnosis not present

## 2012-01-26 DIAGNOSIS — M069 Rheumatoid arthritis, unspecified: Secondary | ICD-10-CM | POA: Diagnosis not present

## 2012-01-26 DIAGNOSIS — M1990 Unspecified osteoarthritis, unspecified site: Secondary | ICD-10-CM | POA: Diagnosis not present

## 2012-01-27 DIAGNOSIS — E876 Hypokalemia: Secondary | ICD-10-CM | POA: Diagnosis not present

## 2012-02-07 DIAGNOSIS — S32009A Unspecified fracture of unspecified lumbar vertebra, initial encounter for closed fracture: Secondary | ICD-10-CM | POA: Diagnosis not present

## 2012-02-07 DIAGNOSIS — M545 Low back pain: Secondary | ICD-10-CM | POA: Diagnosis not present

## 2012-02-16 DIAGNOSIS — F331 Major depressive disorder, recurrent, moderate: Secondary | ICD-10-CM | POA: Diagnosis not present

## 2012-02-16 DIAGNOSIS — F341 Dysthymic disorder: Secondary | ICD-10-CM | POA: Diagnosis not present

## 2012-02-20 DIAGNOSIS — IMO0002 Reserved for concepts with insufficient information to code with codable children: Secondary | ICD-10-CM | POA: Diagnosis not present

## 2012-03-06 DIAGNOSIS — M545 Low back pain: Secondary | ICD-10-CM | POA: Diagnosis not present

## 2012-03-12 DIAGNOSIS — K6289 Other specified diseases of anus and rectum: Secondary | ICD-10-CM | POA: Diagnosis not present

## 2012-03-12 DIAGNOSIS — R197 Diarrhea, unspecified: Secondary | ICD-10-CM | POA: Diagnosis not present

## 2012-03-12 DIAGNOSIS — M545 Low back pain: Secondary | ICD-10-CM | POA: Diagnosis not present

## 2012-04-03 DIAGNOSIS — M545 Low back pain: Secondary | ICD-10-CM | POA: Diagnosis not present

## 2012-04-03 DIAGNOSIS — IMO0001 Reserved for inherently not codable concepts without codable children: Secondary | ICD-10-CM | POA: Diagnosis not present

## 2012-04-03 DIAGNOSIS — M2550 Pain in unspecified joint: Secondary | ICD-10-CM | POA: Diagnosis not present

## 2012-04-03 DIAGNOSIS — M810 Age-related osteoporosis without current pathological fracture: Secondary | ICD-10-CM | POA: Diagnosis not present

## 2012-04-03 DIAGNOSIS — Z79899 Other long term (current) drug therapy: Secondary | ICD-10-CM | POA: Diagnosis not present

## 2012-04-03 DIAGNOSIS — M069 Rheumatoid arthritis, unspecified: Secondary | ICD-10-CM | POA: Diagnosis not present

## 2012-04-03 DIAGNOSIS — M1990 Unspecified osteoarthritis, unspecified site: Secondary | ICD-10-CM | POA: Diagnosis not present

## 2012-04-04 DIAGNOSIS — M8448XA Pathological fracture, other site, initial encounter for fracture: Secondary | ICD-10-CM | POA: Diagnosis not present

## 2012-04-04 DIAGNOSIS — Z23 Encounter for immunization: Secondary | ICD-10-CM | POA: Diagnosis not present

## 2012-04-13 DIAGNOSIS — M25519 Pain in unspecified shoulder: Secondary | ICD-10-CM | POA: Diagnosis not present

## 2012-04-18 ENCOUNTER — Emergency Department (HOSPITAL_COMMUNITY)
Admission: EM | Admit: 2012-04-18 | Discharge: 2012-04-18 | Disposition: A | Discharge status: Home / Self Care | Payer: Medicare Other | Attending: Emergency Medicine | Admitting: Emergency Medicine

## 2012-04-18 ENCOUNTER — Encounter (HOSPITAL_COMMUNITY): Payer: Self-pay | Admitting: *Deleted

## 2012-04-18 ENCOUNTER — Emergency Department (HOSPITAL_COMMUNITY): Payer: Medicare Other

## 2012-04-18 DIAGNOSIS — I10 Essential (primary) hypertension: Secondary | ICD-10-CM | POA: Insufficient documentation

## 2012-04-18 DIAGNOSIS — M25519 Pain in unspecified shoulder: Secondary | ICD-10-CM | POA: Diagnosis not present

## 2012-04-18 DIAGNOSIS — F329 Major depressive disorder, single episode, unspecified: Secondary | ICD-10-CM | POA: Insufficient documentation

## 2012-04-18 DIAGNOSIS — Z888 Allergy status to other drugs, medicaments and biological substances status: Secondary | ICD-10-CM | POA: Diagnosis not present

## 2012-04-18 DIAGNOSIS — J45909 Unspecified asthma, uncomplicated: Secondary | ICD-10-CM | POA: Insufficient documentation

## 2012-04-18 DIAGNOSIS — F411 Generalized anxiety disorder: Secondary | ICD-10-CM | POA: Insufficient documentation

## 2012-04-18 DIAGNOSIS — Z885 Allergy status to narcotic agent status: Secondary | ICD-10-CM | POA: Insufficient documentation

## 2012-04-18 DIAGNOSIS — Z91038 Other insect allergy status: Secondary | ICD-10-CM | POA: Diagnosis not present

## 2012-04-18 DIAGNOSIS — K219 Gastro-esophageal reflux disease without esophagitis: Secondary | ICD-10-CM | POA: Diagnosis not present

## 2012-04-18 DIAGNOSIS — Z882 Allergy status to sulfonamides status: Secondary | ICD-10-CM | POA: Diagnosis not present

## 2012-04-18 DIAGNOSIS — Z881 Allergy status to other antibiotic agents status: Secondary | ICD-10-CM | POA: Insufficient documentation

## 2012-04-18 DIAGNOSIS — F3289 Other specified depressive episodes: Secondary | ICD-10-CM | POA: Insufficient documentation

## 2012-04-18 LAB — CBC WITH DIFFERENTIAL/PLATELET
Basophils Relative: 0 % (ref 0–1)
Hemoglobin: 13.6 g/dL (ref 12.0–15.0)
Lymphs Abs: 2.4 10*3/uL (ref 0.7–4.0)
Monocytes Relative: 10 % (ref 3–12)
Neutro Abs: 5.6 10*3/uL (ref 1.7–7.7)
Neutrophils Relative %: 62 % (ref 43–77)
RBC: 4.63 MIL/uL (ref 3.87–5.11)

## 2012-04-18 LAB — COMPREHENSIVE METABOLIC PANEL
ALT: 15 U/L (ref 0–35)
Albumin: 3.8 g/dL (ref 3.5–5.2)
Alkaline Phosphatase: 74 U/L (ref 39–117)
BUN: 7 mg/dL (ref 6–23)
Chloride: 98 mEq/L (ref 96–112)
Glucose, Bld: 120 mg/dL — ABNORMAL HIGH (ref 70–99)
Potassium: 4 mEq/L (ref 3.5–5.1)
Total Bilirubin: 0.3 mg/dL (ref 0.3–1.2)

## 2012-04-18 MED ORDER — HYDROMORPHONE HCL PF 1 MG/ML IJ SOLN
0.5000 mg | Freq: Once | INTRAMUSCULAR | Status: AC
  Administered 2012-04-18: 0.5 mg via INTRAVENOUS
  Filled 2012-04-18: qty 1

## 2012-04-18 MED ORDER — OXYCODONE HCL 5 MG PO TABS: 5.0000 mg | ORAL_TABLET | ORAL | Status: DC | PRN

## 2012-04-18 MED ORDER — HYDROCODONE-ACETAMINOPHEN 5-325 MG PO TABS
2.0000 | ORAL_TABLET | Freq: Once | ORAL | Status: AC
  Administered 2012-04-18: 2 via ORAL
  Filled 2012-04-18: qty 2

## 2012-04-18 NOTE — ED Provider Notes (Signed)
History     CSN: [id number]  Arrival date & time 04/18/12  1478   First MD Initiated Contact with Patient 04/18/12 [phone]      Chief Complaint  Patient presents with  . Shoulder Pain    (Consider location/radiation/quality/duration/timing/severity/associated sxs/prior treatment) HPI Comments: Patient presents with right shoulder pain after turning in bed in hearing a snapping sound. She has chronic issues with the shoulder has had multiple surgeries. She had increasing pain for the past 4 days. She is scheduled to see your orthopedic doctor 2 days from now. She denies any fevers or vomiting. She denies any weakness, numbness or tingling. She took Dilaudid at home without relief to her pain. Her pain acutely worse this morning when she turned in bed and heard a cracking sound.  Patient is a 78 y.o. female presenting with shoulder pain. The history is provided by the patient.  Shoulder Pain Pertinent negatives include no abdominal pain, no headaches and no shortness of breath.    Past Medical History  Diagnosis Date  . Asthma   . Depression   . GERD (gastroesophageal reflux disease)   . Hyperlipidemia   . Hypertension   . Arthritis   . Anxiety   . Osteopenia     Past Surgical History  Procedure Date  . Joint replacement 2004    Right total hip arthroplasty  . Joint replacement 2006    Right total knee arthroplasty  . Carpal tunnel release 2007, 2009    Bilateral  . Shoulder surgery     Right shoulder hemiarthroplasty 2002 after her right humeral neck fracture, revision hemiarthroplasty 2004 for persistent pain, revision reverse arthroplasty December 2011 for persistent pain, incision and drainage with poly-exchange 01/26/2011 for possible prosthetic joint infection.  . Vaginal hysterectomy   . Ganglion cyst  wrist   . Ganglion cyct removal on fingers   . Tonsillectomy and adenoidectomy     No family history on file.  History  Substance Use Topics  . Smoking status:  Never Smoker   . Smokeless tobacco: Never Used  . Alcohol Use: No    OB History    Grav Para Term Preterm Abortions TAB SAB Ect Mult Living                  Review of Systems  Constitutional: Negative for fever, activity change and appetite change.  HENT: Negative for congestion and rhinorrhea.   Eyes: Negative for visual disturbance.  Respiratory: Negative for cough, chest tightness and shortness of breath.   Gastrointestinal: Negative for nausea, vomiting and abdominal pain.  Genitourinary: Negative for hematuria and dyspareunia.  Musculoskeletal: Positive for myalgias and arthralgias.  Skin: Negative for wound.  Neurological: Negative for dizziness and headaches.    Allergies  Bee venom; Ciprofloxacin; Sulfonamide derivatives; Telithromycin; Valsartan; Verapamil; Cymbalta; Gabapentin; Lamotrigine; Mirtazapine; Morphine; Oxycodone-acetaminophen; Pregabalin; Prochlorperazine edisylate; Pseudoephedrine; and Doxycycline  Home Medications   Current Outpatient Rx  Name Route Sig Dispense Refill  . AMLODIPINE BESYLATE 10 MG PO TABS Oral Take 10 mg by mouth daily.      [name] CALCIUM CARBONATE-VITAMIN D 600-400 MG-UNIT PO TABS Oral Take 1 tablet by mouth 2 (two) times daily.    [name] DIPHENHYDRAMINE HCL 25 MG PO TABS Oral Take 25 mg by mouth every 6 (six) hours as needed. For allergies    . METHOTREXATE 2.5 MG PO TABS Oral Take 10 mg by mouth once a week. Caution:Chemotherapy. Protect from light.    . OMEPRAZOLE 20 MG PO CPDR  Oral Take 20 mg by mouth daily.     [name] SIMVASTATIN 20 MG PO TABS Oral Take 20 mg by mouth every morning.     . ALBUTEROL SULFATE HFA 108 (90 BASE) MCG/ACT IN AERS Inhalation Inhale 2 puffs into the lungs every 4 (four) hours as needed. For shortness of breath.     . ALPRAZOLAM 0.5 MG PO TABS Oral Take 0.5 mg by mouth 4 (four) times daily as needed. For anxiety    . ATENOLOL 25 MG PO TABS Oral Take 25 mg by mouth daily.     [name] EPINEPHRINE 0.3 MG/0.3ML IJ DEVI Intramuscular  Inject 0.3 mg into the muscle as needed.    . IBUPROFEN 800 MG PO TABS Oral Take 800 mg by mouth 3 (three) times daily.    [name] ZOLEDRONIC ACID 5 MG/100ML IV SOLN Intravenous Inject 5 mg into the vein. Once yearly. In april      BP 106/66  Pulse 58  Temp 97.8 F (36.6 C) (Oral)  Resp 16  SpO2 94%  Physical Exam  Constitutional: She is oriented to person, place, and time. She appears well-developed and well-nourished. No distress.  HENT:  Head: Normocephalic and atraumatic.  Mouth/Throat: Oropharynx is clear and moist. No oropharyngeal exudate.  Eyes: Conjunctivae normal and EOM are normal. Pupils are equal, round, and reactive to light.  Neck: Normal range of motion. Neck supple.  Cardiovascular: Normal rate, regular rhythm and normal heart sounds.   No murmur heard. Pulmonary/Chest: Effort normal and breath sounds normal. No respiratory distress.  Abdominal: Soft. There is no tenderness. There is no rebound and no guarding.  Musculoskeletal: She exhibits edema and tenderness.       Right shoulder was reduced range of motion secondary to pain. No obvious deformity. No overlying cellulitis. +2 radial pulse, cardinal hand movements intact, axillary nerve sensation intact  Neurological: She is alert and oriented to person, place, and time. No cranial nerve deficit.  Skin: Skin is warm.    ED Course  Procedures (including critical care time)  Labs Reviewed  COMPREHENSIVE METABOLIC PANEL - Abnormal; Notable for the following:    Glucose, Bld 120 (*)     GFR calc non Af Amer 79 (*)     All other components within normal limits  CBC WITH DIFFERENTIAL   Dg Shoulder Right  04/18/2012  *RADIOLOGY REPORT*  Clinical Data: Shoulder pain.  RIGHT SHOULDER - 2+ VIEW  Comparison: Plain films 01/26/2011.  Findings: Reverse shoulder arthroplasty is again identified.  The device appears located.  No fracture is seen.  Imaged right lung and ribs are unremarkable.  IMPRESSION: No acute finding.   Status post right shoulder replacement.   Original Report Authenticated By: Bernadene Bell. Maricela Curet, M.D.      No diagnosis found.    MDM  Acute on chronic shoulder pain.  Posterior radial pulse, cardinal hand movements intact. There is no obvious deformity to the shoulder joint. X-ray negative for fracture. Hardware in place.  There is no evidence of cellulitis, skin change, warmth or erythema over the shoulder joint. Doubt septic joint. Patient has orthopedic followup appointment with Dr. Ranell Patrick in 2 days. Will give pain medication until then.         Glynn Octave, MD 04/18/12 [phone]

## 2012-04-18 NOTE — ED Notes (Signed)
Pt history of right shoulder replacement x 2 with last surgery 2011 due to infection; c/o increasing pain since last Thurs right shoulder; no known obvious injury; tonight when she rolled over felt a pop in that shoulder with severe pain unrelieved by a dose of dilaudid from home meds

## 2012-04-19 DIAGNOSIS — M25519 Pain in unspecified shoulder: Secondary | ICD-10-CM | POA: Diagnosis not present

## 2012-04-20 DIAGNOSIS — M25519 Pain in unspecified shoulder: Secondary | ICD-10-CM | POA: Diagnosis not present

## 2012-04-23 DIAGNOSIS — N390 Urinary tract infection, site not specified: Secondary | ICD-10-CM | POA: Diagnosis not present

## 2012-04-23 DIAGNOSIS — R300 Dysuria: Secondary | ICD-10-CM | POA: Diagnosis not present

## 2012-04-24 ENCOUNTER — Other Ambulatory Visit (HOSPITAL_COMMUNITY): Payer: Self-pay | Admitting: Orthopedic Surgery

## 2012-04-24 DIAGNOSIS — M25511 Pain in right shoulder: Secondary | ICD-10-CM

## 2012-04-24 DIAGNOSIS — M25519 Pain in unspecified shoulder: Secondary | ICD-10-CM | POA: Diagnosis not present

## 2012-05-01 DIAGNOSIS — M069 Rheumatoid arthritis, unspecified: Secondary | ICD-10-CM | POA: Diagnosis not present

## 2012-05-01 DIAGNOSIS — M810 Age-related osteoporosis without current pathological fracture: Secondary | ICD-10-CM | POA: Diagnosis not present

## 2012-05-02 ENCOUNTER — Encounter (HOSPITAL_COMMUNITY)
Admission: RE | Admit: 2012-05-02 | Discharge: 2012-05-02 | Disposition: A | Discharge status: Home / Self Care | Payer: Medicare Other | Source: Ambulatory Visit | Attending: Orthopedic Surgery | Admitting: Orthopedic Surgery

## 2012-05-02 ENCOUNTER — Encounter (HOSPITAL_COMMUNITY): Admission: RE | Admit: 2012-05-02 | Payer: Medicare Other | Source: Ambulatory Visit

## 2012-05-02 DIAGNOSIS — M25519 Pain in unspecified shoulder: Secondary | ICD-10-CM | POA: Insufficient documentation

## 2012-05-02 DIAGNOSIS — M25511 Pain in right shoulder: Secondary | ICD-10-CM

## 2012-05-02 DIAGNOSIS — Z96619 Presence of unspecified artificial shoulder joint: Secondary | ICD-10-CM | POA: Diagnosis not present

## 2012-05-02 MED ORDER — TECHNETIUM TC 99M EXAMETAZIME IV KIT
10.0000 | PACK | Freq: Once | INTRAVENOUS | Status: AC | PRN
  Administered 2012-05-02: 10 via INTRAVENOUS

## 2012-05-04 DIAGNOSIS — F331 Major depressive disorder, recurrent, moderate: Secondary | ICD-10-CM | POA: Diagnosis not present

## 2012-05-08 DIAGNOSIS — F331 Major depressive disorder, recurrent, moderate: Secondary | ICD-10-CM | POA: Diagnosis not present

## 2012-05-08 DIAGNOSIS — F341 Dysthymic disorder: Secondary | ICD-10-CM | POA: Diagnosis not present

## 2012-05-08 DIAGNOSIS — M25519 Pain in unspecified shoulder: Secondary | ICD-10-CM | POA: Diagnosis not present

## 2012-05-11 DIAGNOSIS — M069 Rheumatoid arthritis, unspecified: Secondary | ICD-10-CM | POA: Diagnosis not present

## 2012-05-11 DIAGNOSIS — R1013 Epigastric pain: Secondary | ICD-10-CM | POA: Diagnosis not present

## 2012-05-11 DIAGNOSIS — IMO0001 Reserved for inherently not codable concepts without codable children: Secondary | ICD-10-CM | POA: Diagnosis not present

## 2012-05-11 DIAGNOSIS — M2550 Pain in unspecified joint: Secondary | ICD-10-CM | POA: Diagnosis not present

## 2012-05-11 DIAGNOSIS — Z79899 Other long term (current) drug therapy: Secondary | ICD-10-CM | POA: Diagnosis not present

## 2012-05-11 DIAGNOSIS — M1990 Unspecified osteoarthritis, unspecified site: Secondary | ICD-10-CM | POA: Diagnosis not present

## 2012-05-17 DIAGNOSIS — F341 Dysthymic disorder: Secondary | ICD-10-CM | POA: Diagnosis not present

## 2012-05-17 DIAGNOSIS — IMO0002 Reserved for concepts with insufficient information to code with codable children: Secondary | ICD-10-CM | POA: Diagnosis not present

## 2012-05-18 ENCOUNTER — Encounter: Payer: Self-pay | Admitting: *Deleted

## 2012-05-23 ENCOUNTER — Encounter: Payer: Self-pay | Admitting: *Deleted

## 2012-05-23 DIAGNOSIS — F410 Panic disorder [episodic paroxysmal anxiety] without agoraphobia: Secondary | ICD-10-CM | POA: Diagnosis not present

## 2012-05-24 DIAGNOSIS — M069 Rheumatoid arthritis, unspecified: Secondary | ICD-10-CM | POA: Diagnosis not present

## 2012-05-24 DIAGNOSIS — I10 Essential (primary) hypertension: Secondary | ICD-10-CM | POA: Diagnosis not present

## 2012-05-24 DIAGNOSIS — M25519 Pain in unspecified shoulder: Secondary | ICD-10-CM | POA: Diagnosis not present

## 2012-05-24 DIAGNOSIS — M48061 Spinal stenosis, lumbar region without neurogenic claudication: Secondary | ICD-10-CM | POA: Diagnosis not present

## 2012-05-24 DIAGNOSIS — Z79899 Other long term (current) drug therapy: Secondary | ICD-10-CM | POA: Diagnosis not present

## 2012-05-24 DIAGNOSIS — R002 Palpitations: Secondary | ICD-10-CM | POA: Diagnosis not present

## 2012-06-04 DIAGNOSIS — F341 Dysthymic disorder: Secondary | ICD-10-CM | POA: Diagnosis not present

## 2012-06-04 DIAGNOSIS — F331 Major depressive disorder, recurrent, moderate: Secondary | ICD-10-CM | POA: Diagnosis not present

## 2012-06-04 DIAGNOSIS — F410 Panic disorder [episodic paroxysmal anxiety] without agoraphobia: Secondary | ICD-10-CM | POA: Diagnosis not present

## 2012-06-11 DIAGNOSIS — F410 Panic disorder [episodic paroxysmal anxiety] without agoraphobia: Secondary | ICD-10-CM | POA: Diagnosis not present

## 2012-06-11 DIAGNOSIS — F331 Major depressive disorder, recurrent, moderate: Secondary | ICD-10-CM | POA: Diagnosis not present

## 2012-06-11 DIAGNOSIS — F341 Dysthymic disorder: Secondary | ICD-10-CM | POA: Diagnosis not present

## 2012-06-12 ENCOUNTER — Ambulatory Visit: Payer: Medicare Other | Admitting: Internal Medicine

## 2012-06-21 DIAGNOSIS — J069 Acute upper respiratory infection, unspecified: Secondary | ICD-10-CM | POA: Diagnosis not present

## 2012-06-22 DIAGNOSIS — G894 Chronic pain syndrome: Secondary | ICD-10-CM | POA: Diagnosis not present

## 2012-06-22 DIAGNOSIS — M25519 Pain in unspecified shoulder: Secondary | ICD-10-CM | POA: Diagnosis not present

## 2012-06-22 DIAGNOSIS — M47817 Spondylosis without myelopathy or radiculopathy, lumbosacral region: Secondary | ICD-10-CM | POA: Diagnosis not present

## 2012-06-25 DIAGNOSIS — F331 Major depressive disorder, recurrent, moderate: Secondary | ICD-10-CM | POA: Diagnosis not present

## 2012-06-26 DIAGNOSIS — F341 Dysthymic disorder: Secondary | ICD-10-CM | POA: Diagnosis not present

## 2012-06-26 DIAGNOSIS — F410 Panic disorder [episodic paroxysmal anxiety] without agoraphobia: Secondary | ICD-10-CM | POA: Diagnosis not present

## 2012-06-26 DIAGNOSIS — IMO0002 Reserved for concepts with insufficient information to code with codable children: Secondary | ICD-10-CM | POA: Diagnosis not present

## 2012-07-09 DIAGNOSIS — F331 Major depressive disorder, recurrent, moderate: Secondary | ICD-10-CM | POA: Diagnosis not present

## 2012-07-09 DIAGNOSIS — F341 Dysthymic disorder: Secondary | ICD-10-CM | POA: Diagnosis not present

## 2012-07-09 DIAGNOSIS — F410 Panic disorder [episodic paroxysmal anxiety] without agoraphobia: Secondary | ICD-10-CM | POA: Diagnosis not present

## 2012-07-13 DIAGNOSIS — M069 Rheumatoid arthritis, unspecified: Secondary | ICD-10-CM | POA: Diagnosis not present

## 2012-07-13 DIAGNOSIS — M1990 Unspecified osteoarthritis, unspecified site: Secondary | ICD-10-CM | POA: Diagnosis not present

## 2012-07-13 DIAGNOSIS — M2550 Pain in unspecified joint: Secondary | ICD-10-CM | POA: Diagnosis not present

## 2012-07-13 DIAGNOSIS — Z79899 Other long term (current) drug therapy: Secondary | ICD-10-CM | POA: Diagnosis not present

## 2012-07-13 DIAGNOSIS — IMO0001 Reserved for inherently not codable concepts without codable children: Secondary | ICD-10-CM | POA: Diagnosis not present

## 2012-07-20 DIAGNOSIS — M47817 Spondylosis without myelopathy or radiculopathy, lumbosacral region: Secondary | ICD-10-CM | POA: Diagnosis not present

## 2012-07-20 DIAGNOSIS — G894 Chronic pain syndrome: Secondary | ICD-10-CM | POA: Diagnosis not present

## 2012-07-20 DIAGNOSIS — IMO0002 Reserved for concepts with insufficient information to code with codable children: Secondary | ICD-10-CM | POA: Diagnosis not present

## 2012-08-06 DIAGNOSIS — F410 Panic disorder [episodic paroxysmal anxiety] without agoraphobia: Secondary | ICD-10-CM | POA: Diagnosis not present

## 2012-08-06 DIAGNOSIS — F341 Dysthymic disorder: Secondary | ICD-10-CM | POA: Diagnosis not present

## 2012-08-06 DIAGNOSIS — F331 Major depressive disorder, recurrent, moderate: Secondary | ICD-10-CM | POA: Diagnosis not present

## 2012-09-03 DIAGNOSIS — F341 Dysthymic disorder: Secondary | ICD-10-CM | POA: Diagnosis not present

## 2012-09-03 DIAGNOSIS — F331 Major depressive disorder, recurrent, moderate: Secondary | ICD-10-CM | POA: Diagnosis not present

## 2012-09-03 DIAGNOSIS — F410 Panic disorder [episodic paroxysmal anxiety] without agoraphobia: Secondary | ICD-10-CM | POA: Diagnosis not present

## 2012-09-05 DIAGNOSIS — E785 Hyperlipidemia, unspecified: Secondary | ICD-10-CM | POA: Diagnosis not present

## 2012-09-05 DIAGNOSIS — Z0000 Encounter for general adult medical examination without abnormal findings: Secondary | ICD-10-CM | POA: Diagnosis not present

## 2012-09-06 ENCOUNTER — Ambulatory Visit (HOSPITAL_COMMUNITY)
Admission: RE | Admit: 2012-09-06 | Discharge: 2012-09-06 | Disposition: A | Discharge status: Home / Self Care | Payer: Medicare Other | Source: Ambulatory Visit | Attending: Physical Medicine and Rehabilitation | Admitting: Physical Medicine and Rehabilitation

## 2012-09-06 ENCOUNTER — Other Ambulatory Visit (HOSPITAL_COMMUNITY): Payer: Self-pay | Admitting: Physical Medicine and Rehabilitation

## 2012-09-06 DIAGNOSIS — M549 Dorsalgia, unspecified: Secondary | ICD-10-CM | POA: Diagnosis not present

## 2012-09-06 DIAGNOSIS — IMO0002 Reserved for concepts with insufficient information to code with codable children: Secondary | ICD-10-CM | POA: Diagnosis not present

## 2012-09-06 DIAGNOSIS — M47817 Spondylosis without myelopathy or radiculopathy, lumbosacral region: Secondary | ICD-10-CM | POA: Diagnosis not present

## 2012-09-06 DIAGNOSIS — N2889 Other specified disorders of kidney and ureter: Secondary | ICD-10-CM | POA: Diagnosis not present

## 2012-09-06 DIAGNOSIS — G894 Chronic pain syndrome: Secondary | ICD-10-CM | POA: Diagnosis not present

## 2012-10-02 DIAGNOSIS — F341 Dysthymic disorder: Secondary | ICD-10-CM | POA: Diagnosis not present

## 2012-10-02 DIAGNOSIS — IMO0002 Reserved for concepts with insufficient information to code with codable children: Secondary | ICD-10-CM | POA: Diagnosis not present

## 2012-10-03 DIAGNOSIS — Z87442 Personal history of urinary calculi: Secondary | ICD-10-CM | POA: Diagnosis not present

## 2012-10-03 DIAGNOSIS — R3129 Other microscopic hematuria: Secondary | ICD-10-CM | POA: Diagnosis not present

## 2012-10-03 DIAGNOSIS — N3941 Urge incontinence: Secondary | ICD-10-CM | POA: Diagnosis not present

## 2012-10-03 DIAGNOSIS — M545 Low back pain: Secondary | ICD-10-CM | POA: Diagnosis not present

## 2012-10-03 DIAGNOSIS — K8020 Calculus of gallbladder without cholecystitis without obstruction: Secondary | ICD-10-CM | POA: Diagnosis not present

## 2012-10-03 DIAGNOSIS — R82998 Other abnormal findings in urine: Secondary | ICD-10-CM | POA: Diagnosis not present

## 2012-10-08 DIAGNOSIS — F331 Major depressive disorder, recurrent, moderate: Secondary | ICD-10-CM | POA: Diagnosis not present

## 2012-10-12 DIAGNOSIS — M2550 Pain in unspecified joint: Secondary | ICD-10-CM | POA: Diagnosis not present

## 2012-10-12 DIAGNOSIS — Z79899 Other long term (current) drug therapy: Secondary | ICD-10-CM | POA: Diagnosis not present

## 2012-10-12 DIAGNOSIS — IMO0001 Reserved for inherently not codable concepts without codable children: Secondary | ICD-10-CM | POA: Diagnosis not present

## 2012-10-12 DIAGNOSIS — M069 Rheumatoid arthritis, unspecified: Secondary | ICD-10-CM | POA: Diagnosis not present

## 2012-10-12 DIAGNOSIS — M1990 Unspecified osteoarthritis, unspecified site: Secondary | ICD-10-CM | POA: Diagnosis not present

## 2012-10-25 DIAGNOSIS — M47817 Spondylosis without myelopathy or radiculopathy, lumbosacral region: Secondary | ICD-10-CM | POA: Diagnosis not present

## 2012-11-07 DIAGNOSIS — M47817 Spondylosis without myelopathy or radiculopathy, lumbosacral region: Secondary | ICD-10-CM | POA: Diagnosis not present

## 2012-11-09 DIAGNOSIS — M069 Rheumatoid arthritis, unspecified: Secondary | ICD-10-CM | POA: Diagnosis not present

## 2012-11-22 DIAGNOSIS — M47817 Spondylosis without myelopathy or radiculopathy, lumbosacral region: Secondary | ICD-10-CM | POA: Diagnosis not present

## 2012-12-04 ENCOUNTER — Other Ambulatory Visit (HOSPITAL_COMMUNITY): Payer: Self-pay | Admitting: Family Medicine

## 2012-12-05 DIAGNOSIS — G894 Chronic pain syndrome: Secondary | ICD-10-CM | POA: Diagnosis not present

## 2012-12-05 DIAGNOSIS — M722 Plantar fascial fibromatosis: Secondary | ICD-10-CM | POA: Diagnosis not present

## 2012-12-05 DIAGNOSIS — M47817 Spondylosis without myelopathy or radiculopathy, lumbosacral region: Secondary | ICD-10-CM | POA: Diagnosis not present

## 2012-12-05 DIAGNOSIS — M7660 Achilles tendinitis, unspecified leg: Secondary | ICD-10-CM | POA: Diagnosis not present

## 2012-12-06 ENCOUNTER — Ambulatory Visit (HOSPITAL_COMMUNITY): Admission: RE | Admit: 2012-12-06 | Payer: Medicare Other | Source: Ambulatory Visit

## 2012-12-12 DIAGNOSIS — Z79899 Other long term (current) drug therapy: Secondary | ICD-10-CM | POA: Diagnosis not present

## 2012-12-18 DIAGNOSIS — F410 Panic disorder [episodic paroxysmal anxiety] without agoraphobia: Secondary | ICD-10-CM | POA: Diagnosis not present

## 2012-12-18 DIAGNOSIS — F341 Dysthymic disorder: Secondary | ICD-10-CM | POA: Diagnosis not present

## 2012-12-18 DIAGNOSIS — F331 Major depressive disorder, recurrent, moderate: Secondary | ICD-10-CM | POA: Diagnosis not present

## 2013-01-02 ENCOUNTER — Ambulatory Visit (HOSPITAL_COMMUNITY): Admission: RE | Admit: 2013-01-02 | Payer: Medicare Other | Source: Ambulatory Visit

## 2013-01-04 DIAGNOSIS — M810 Age-related osteoporosis without current pathological fracture: Secondary | ICD-10-CM | POA: Diagnosis not present

## 2013-01-04 DIAGNOSIS — G894 Chronic pain syndrome: Secondary | ICD-10-CM | POA: Diagnosis not present

## 2013-01-04 DIAGNOSIS — M47817 Spondylosis without myelopathy or radiculopathy, lumbosacral region: Secondary | ICD-10-CM | POA: Diagnosis not present

## 2013-01-04 DIAGNOSIS — M8448XA Pathological fracture, other site, initial encounter for fracture: Secondary | ICD-10-CM | POA: Diagnosis not present

## 2013-01-07 ENCOUNTER — Ambulatory Visit (HOSPITAL_COMMUNITY)
Admission: RE | Admit: 2013-01-07 | Discharge: 2013-01-07 | Disposition: A | Discharge status: Home / Self Care | Payer: Medicare Other | Source: Ambulatory Visit | Attending: Family Medicine | Admitting: Family Medicine

## 2013-01-07 ENCOUNTER — Other Ambulatory Visit (HOSPITAL_COMMUNITY): Payer: Self-pay | Admitting: Family Medicine

## 2013-01-07 ENCOUNTER — Encounter (HOSPITAL_COMMUNITY): Payer: Self-pay

## 2013-01-07 DIAGNOSIS — M810 Age-related osteoporosis without current pathological fracture: Secondary | ICD-10-CM | POA: Insufficient documentation

## 2013-01-07 MED ORDER — SODIUM CHLORIDE 0.9 % IV SOLN
Freq: Once | INTRAVENOUS | Status: AC
  Administered 2013-01-07: 13:00:00 via INTRAVENOUS

## 2013-01-07 MED ORDER — ZOLEDRONIC ACID 5 MG/100ML IV SOLN
5.0000 mg | Freq: Once | INTRAVENOUS | Status: AC
  Administered 2013-01-07: 5 mg via INTRAVENOUS
  Filled 2013-01-07: qty 100

## 2013-01-09 DIAGNOSIS — M069 Rheumatoid arthritis, unspecified: Secondary | ICD-10-CM | POA: Diagnosis not present

## 2013-01-09 DIAGNOSIS — Z79899 Other long term (current) drug therapy: Secondary | ICD-10-CM | POA: Diagnosis not present

## 2013-01-09 DIAGNOSIS — M2550 Pain in unspecified joint: Secondary | ICD-10-CM | POA: Diagnosis not present

## 2013-01-09 DIAGNOSIS — M1990 Unspecified osteoarthritis, unspecified site: Secondary | ICD-10-CM | POA: Diagnosis not present

## 2013-02-06 DIAGNOSIS — M069 Rheumatoid arthritis, unspecified: Secondary | ICD-10-CM | POA: Diagnosis not present

## 2013-02-12 DIAGNOSIS — M069 Rheumatoid arthritis, unspecified: Secondary | ICD-10-CM | POA: Diagnosis not present

## 2013-02-12 DIAGNOSIS — M810 Age-related osteoporosis without current pathological fracture: Secondary | ICD-10-CM | POA: Diagnosis not present

## 2013-02-12 DIAGNOSIS — G894 Chronic pain syndrome: Secondary | ICD-10-CM | POA: Diagnosis not present

## 2013-02-12 DIAGNOSIS — M8448XA Pathological fracture, other site, initial encounter for fracture: Secondary | ICD-10-CM | POA: Diagnosis not present

## 2013-02-20 DIAGNOSIS — F331 Major depressive disorder, recurrent, moderate: Secondary | ICD-10-CM | POA: Diagnosis not present

## 2013-02-20 DIAGNOSIS — F341 Dysthymic disorder: Secondary | ICD-10-CM | POA: Diagnosis not present

## 2013-02-20 DIAGNOSIS — F410 Panic disorder [episodic paroxysmal anxiety] without agoraphobia: Secondary | ICD-10-CM | POA: Diagnosis not present

## 2013-03-06 DIAGNOSIS — M069 Rheumatoid arthritis, unspecified: Secondary | ICD-10-CM | POA: Diagnosis not present

## 2013-03-06 DIAGNOSIS — G894 Chronic pain syndrome: Secondary | ICD-10-CM | POA: Diagnosis not present

## 2013-03-06 DIAGNOSIS — M2550 Pain in unspecified joint: Secondary | ICD-10-CM | POA: Diagnosis not present

## 2013-04-03 DIAGNOSIS — I10 Essential (primary) hypertension: Secondary | ICD-10-CM | POA: Diagnosis not present

## 2013-04-03 DIAGNOSIS — K219 Gastro-esophageal reflux disease without esophagitis: Secondary | ICD-10-CM | POA: Diagnosis not present

## 2013-04-05 DIAGNOSIS — M67919 Unspecified disorder of synovium and tendon, unspecified shoulder: Secondary | ICD-10-CM | POA: Diagnosis not present

## 2013-04-05 DIAGNOSIS — M2550 Pain in unspecified joint: Secondary | ICD-10-CM | POA: Diagnosis not present

## 2013-04-05 DIAGNOSIS — M069 Rheumatoid arthritis, unspecified: Secondary | ICD-10-CM | POA: Diagnosis not present

## 2013-04-05 DIAGNOSIS — G894 Chronic pain syndrome: Secondary | ICD-10-CM | POA: Diagnosis not present

## 2013-04-11 DIAGNOSIS — Z79899 Other long term (current) drug therapy: Secondary | ICD-10-CM | POA: Diagnosis not present

## 2013-04-11 DIAGNOSIS — R5381 Other malaise: Secondary | ICD-10-CM | POA: Diagnosis not present

## 2013-04-11 DIAGNOSIS — M2550 Pain in unspecified joint: Secondary | ICD-10-CM | POA: Diagnosis not present

## 2013-04-11 DIAGNOSIS — M069 Rheumatoid arthritis, unspecified: Secondary | ICD-10-CM | POA: Diagnosis not present

## 2013-04-11 DIAGNOSIS — M1990 Unspecified osteoarthritis, unspecified site: Secondary | ICD-10-CM | POA: Diagnosis not present

## 2013-04-16 ENCOUNTER — Ambulatory Visit (HOSPITAL_COMMUNITY)
Admission: RE | Admit: 2013-04-16 | Discharge: 2013-04-16 | Disposition: A | Discharge status: Home / Self Care | Payer: Medicare Other | Source: Ambulatory Visit | Attending: Anesthesiology | Admitting: Anesthesiology

## 2013-04-16 ENCOUNTER — Other Ambulatory Visit (HOSPITAL_COMMUNITY): Payer: Self-pay | Admitting: Anesthesiology

## 2013-04-16 ENCOUNTER — Encounter: Payer: Self-pay | Admitting: Internal Medicine

## 2013-04-16 DIAGNOSIS — M25519 Pain in unspecified shoulder: Secondary | ICD-10-CM | POA: Insufficient documentation

## 2013-04-16 DIAGNOSIS — Z9181 History of falling: Secondary | ICD-10-CM | POA: Insufficient documentation

## 2013-04-16 DIAGNOSIS — S4980XA Other specified injuries of shoulder and upper arm, unspecified arm, initial encounter: Secondary | ICD-10-CM | POA: Diagnosis not present

## 2013-04-16 DIAGNOSIS — M25512 Pain in left shoulder: Secondary | ICD-10-CM

## 2013-04-18 DIAGNOSIS — M67919 Unspecified disorder of synovium and tendon, unspecified shoulder: Secondary | ICD-10-CM | POA: Diagnosis not present

## 2013-04-18 DIAGNOSIS — G894 Chronic pain syndrome: Secondary | ICD-10-CM | POA: Diagnosis not present

## 2013-04-18 DIAGNOSIS — M069 Rheumatoid arthritis, unspecified: Secondary | ICD-10-CM | POA: Diagnosis not present

## 2013-04-18 DIAGNOSIS — M2550 Pain in unspecified joint: Secondary | ICD-10-CM | POA: Diagnosis not present

## 2013-04-30 DIAGNOSIS — M67919 Unspecified disorder of synovium and tendon, unspecified shoulder: Secondary | ICD-10-CM | POA: Diagnosis not present

## 2013-04-30 DIAGNOSIS — M069 Rheumatoid arthritis, unspecified: Secondary | ICD-10-CM | POA: Diagnosis not present

## 2013-04-30 DIAGNOSIS — G894 Chronic pain syndrome: Secondary | ICD-10-CM | POA: Diagnosis not present

## 2013-04-30 DIAGNOSIS — M2550 Pain in unspecified joint: Secondary | ICD-10-CM | POA: Diagnosis not present

## 2013-05-07 DIAGNOSIS — F331 Major depressive disorder, recurrent, moderate: Secondary | ICD-10-CM | POA: Diagnosis not present

## 2013-05-07 DIAGNOSIS — F410 Panic disorder [episodic paroxysmal anxiety] without agoraphobia: Secondary | ICD-10-CM | POA: Diagnosis not present

## 2013-05-20 ENCOUNTER — Encounter: Payer: Self-pay | Admitting: Cardiology

## 2013-05-20 DIAGNOSIS — Z79899 Other long term (current) drug therapy: Secondary | ICD-10-CM | POA: Diagnosis not present

## 2013-05-20 DIAGNOSIS — M2550 Pain in unspecified joint: Secondary | ICD-10-CM | POA: Diagnosis not present

## 2013-05-20 DIAGNOSIS — M069 Rheumatoid arthritis, unspecified: Secondary | ICD-10-CM | POA: Diagnosis not present

## 2013-05-20 DIAGNOSIS — M1990 Unspecified osteoarthritis, unspecified site: Secondary | ICD-10-CM | POA: Diagnosis not present

## 2013-05-21 DIAGNOSIS — M25519 Pain in unspecified shoulder: Secondary | ICD-10-CM | POA: Diagnosis not present

## 2013-05-22 ENCOUNTER — Encounter: Payer: Self-pay | Admitting: Cardiology

## 2013-05-22 DIAGNOSIS — K219 Gastro-esophageal reflux disease without esophagitis: Secondary | ICD-10-CM | POA: Insufficient documentation

## 2013-05-23 ENCOUNTER — Ambulatory Visit (INDEPENDENT_AMBULATORY_CARE_PROVIDER_SITE_OTHER): Payer: Medicare Other | Admitting: Cardiology

## 2013-05-23 ENCOUNTER — Encounter: Payer: Self-pay | Admitting: Cardiology

## 2013-05-23 VITALS — BP 96/56 | HR 41 | Ht 61.0 in | Wt 157.0 lb

## 2013-05-23 DIAGNOSIS — M67919 Unspecified disorder of synovium and tendon, unspecified shoulder: Secondary | ICD-10-CM | POA: Diagnosis not present

## 2013-05-23 DIAGNOSIS — M069 Rheumatoid arthritis, unspecified: Secondary | ICD-10-CM | POA: Diagnosis not present

## 2013-05-23 DIAGNOSIS — I10 Essential (primary) hypertension: Secondary | ICD-10-CM

## 2013-05-23 DIAGNOSIS — G894 Chronic pain syndrome: Secondary | ICD-10-CM | POA: Diagnosis not present

## 2013-05-23 DIAGNOSIS — M2550 Pain in unspecified joint: Secondary | ICD-10-CM | POA: Diagnosis not present

## 2013-05-23 MED ORDER — AMLODIPINE BESYLATE 10 MG PO TABS: 5.0000 mg | ORAL_TABLET | Freq: Every day | ORAL | Status: DC

## 2013-05-23 NOTE — Patient Instructions (Addendum)
Your BP is low today so I would like you to stop your Atenolol.  Please take 1/2 tablet of Amlodipine daily.  Please check your BP daily starting Monday 10/27 and call in a week with results.  Your physician recommends that you schedule a follow-up appointment in: Two-Three weeks with Dr. Mayford Knife

## 2013-05-23 NOTE — Progress Notes (Signed)
[address] [place], [place]  [address] Phone: [phone] Fax:  [phone]  Date:  05/23/2013   ID:  [name], DOB Jun 05, 1933, MRN [id number]  PCP:  Gretel Acre, MD  Cardiologist:  Armanda Magic, MD     History of Present Illness: [name] is a 79 y.o. female with a history of HTN.  She is doing well.  She denies any chest pain, SOB, DOE, LE edema, palpitations or syncope.  She noticed that her HR was slow yesterday.  She rarely gets dizziness but if she does it is usually at PT when she gets up too quickly.  She says that she is chronically fatigue but she is on pain meds for chronic arthritic pain.  Wt Readings from Last 3 Encounters:  05/23/13 157 lb (71.215 kg)  05/09/11 156 lb 12.8 oz (71.124 kg)  04/14/11 155 lb (70.308 kg)     Past Medical History  Diagnosis Date  . Asthma   . Depression   . GERD (gastroesophageal reflux disease)   . Arthritis     osteoarthritis  . Anxiety   . Osteopenia   . Candida esophagitis   . Diverticulosis   . Tubular adenoma of colon   . Osteoarthritis   . DJD (degenerative joint disease)      L5 compression fracture  . Gout   . Renal failure, unspecified   . Fibromyalgia   . C. difficile diarrhea   . Chronic sinusitis   . Panic disorder   . Spinal stenosis of lumbar region   . Hyperlipidemia   . Hypertension     Current Outpatient Prescriptions  Medication Sig Dispense Refill  . ALPRAZolam (XANAX) 0.5 MG tablet Take 0.5 mg by mouth 4 (four) times daily as needed. For anxiety      . amLODipine (NORVASC) 10 MG tablet Take 10 mg by mouth daily.        [name] atenolol (TENORMIN) 25 MG tablet Take 25 mg by mouth daily.       . Calcium Carbonate-Vitamin D (CALCIUM 600+D) 600-400 MG-UNIT per tablet Take 1 tablet by mouth 2 (two) times daily.      . diphenhydrAMINE (BENADRYL) 25 MG tablet Take 25 mg by mouth every 6 (six) hours as needed. For allergies      . EPINEPHrine (EPI-PEN) 0.3 mg/0.3 mL DEVI Inject 0.3  mg into the muscle as needed.      [name] escitalopram (LEXAPRO) 10 MG tablet Take 15 mg by mouth daily.      . fentaNYL (DURAGESIC - DOSED MCG/HR) 25 MCG/HR Place 1 patch onto the skin every 3 (three) days.      . folic acid (FOLVITE) 1 MG tablet Take 2 mg by mouth daily.      . hydrOXYzine (VISTARIL) 50 MG capsule at bedtime as needed.      . Methotrexate Sodium, PF, 50 MG/2ML SOLN once a week.      [name] omeprazole (PRILOSEC) 20 MG capsule Take 20 mg by mouth daily.       . simvastatin (ZOCOR) 20 MG tablet Take 20 mg by mouth every morning.       . zoledronic acid (RECLAST) 5 MG/100ML SOLN Inject 5 mg into the vein. Once yearly. In april      . albuterol (PROVENTIL HFA;VENTOLIN HFA) 108 (90 BASE) MCG/ACT inhaler Inhale 2 puffs into the lungs every 4 (four) hours as needed. For shortness of breath.       [name]  HYDROcodone-acetaminophen (NORCO/VICODIN) 5-325 MG per tablet Take 1 tablet by mouth every 6 (six) hours as needed for pain.      [name] ibuprofen (ADVIL,MOTRIN) 800 MG tablet Take 800 mg by mouth 3 (three) times daily.      . methotrexate (RHEUMATREX) 2.5 MG tablet Take 10 mg by mouth once a week. Caution:Chemotherapy. Protect from light.       No current facility-administered medications for this visit.    Allergies:    Allergies  Allergen Reactions  . Bee Venom Anaphylaxis    Has epi-pen  . Ciprofloxacin Anaphylaxis and Palpitations  . Sulfonamide Derivatives Anaphylaxis  . Telithromycin Anaphylaxis    Reaction: also blurred vision   . Valsartan Anaphylaxis  . Verapamil Anaphylaxis  . Cymbalta [Duloxetine Hcl] Other (See Comments)    Reaction:Confusion and " I fall down"  . Gabapentin Other (See Comments)    Reaction: headache  . Lamotrigine     Patient unsure at this time  . Mirtazapine Other (See Comments)    unknown  . Morphine Other (See Comments)    Medication has no effect with pain  . Oxycodone-Acetaminophen Hives  . Pregabalin Swelling  . Prochlorperazine Edisylate   .  Pseudoephedrine Other (See Comments)    Reaction: hyperventilates and blacks out  . Relafen [Nabumetone] Swelling  . Doxycycline Hives    Social History:  The patient  reports that she has never smoked. She has never used smokeless tobacco. She reports that she does not drink alcohol or use illicit drugs.   Family History:  The patient's family history includes Breast cancer in an other family member; Cancer in her father; Colon polyps in an other family member; Diabetes in her mother.   ROS:  Please see the history of present illness.      All other systems reviewed and negative.   PHYSICAL EXAM: VS:  BP 96/56  Pulse 41  Ht 5\' 1"  (1.549 m)  Wt 157 lb (71.215 kg)  BMI 29.68 kg/m2 Well nourished, well developed, in no acute distress HEENT: normal Neck: no JVD Cardiac:  normal S1, S2; RRR; no murmur Lungs:  clear to auscultation bilaterally, no wheezing, rhonchi or rales Abd: soft, nontender, no hepatomegaly Ext: no edema Skin: warm and dry Neuro:  CNs 2-12 intact, no focal abnormalities noted  EKG:  Sinus bradycardia with junctional bradycardia at 41bpm     ASSESSMENT AND PLAN:  1. Intermittent junctional bradycardia without any significant symptoms.  She is chronically fatigued but she thinks that is from the chronic pain meds she takes.  - I instructed her to stop her Atenolol 2. HTN  - BP is low today.  I will keep her on the amlodipine since we are stopping her Atenolol  - decrease amlodipine to 5mg  daily  - Check BP daily for a week starting Monday Oct 27th and call with the results  Followup with me in 2-3 weeks  Signed, Armanda Magic, MD 05/23/2013 9:31 AM

## 2013-05-24 DIAGNOSIS — M25519 Pain in unspecified shoulder: Secondary | ICD-10-CM | POA: Diagnosis not present

## 2013-05-28 DIAGNOSIS — M25519 Pain in unspecified shoulder: Secondary | ICD-10-CM | POA: Diagnosis not present

## 2013-05-31 DIAGNOSIS — H2510 Age-related nuclear cataract, unspecified eye: Secondary | ICD-10-CM | POA: Diagnosis not present

## 2013-06-02 ENCOUNTER — Encounter: Payer: Self-pay | Admitting: Cardiology

## 2013-06-02 DIAGNOSIS — R600 Localized edema: Secondary | ICD-10-CM | POA: Insufficient documentation

## 2013-06-03 ENCOUNTER — Ambulatory Visit (INDEPENDENT_AMBULATORY_CARE_PROVIDER_SITE_OTHER): Payer: Medicare Other | Admitting: Cardiology

## 2013-06-03 ENCOUNTER — Encounter: Payer: Self-pay | Admitting: Cardiology

## 2013-06-03 VITALS — BP 142/66 | HR 91 | Ht 61.0 in | Wt 156.0 lb

## 2013-06-03 DIAGNOSIS — I10 Essential (primary) hypertension: Secondary | ICD-10-CM

## 2013-06-03 DIAGNOSIS — I498 Other specified cardiac arrhythmias: Secondary | ICD-10-CM | POA: Diagnosis not present

## 2013-06-03 DIAGNOSIS — F331 Major depressive disorder, recurrent, moderate: Secondary | ICD-10-CM | POA: Diagnosis not present

## 2013-06-03 DIAGNOSIS — R001 Bradycardia, unspecified: Secondary | ICD-10-CM | POA: Insufficient documentation

## 2013-06-03 DIAGNOSIS — F341 Dysthymic disorder: Secondary | ICD-10-CM | POA: Diagnosis not present

## 2013-06-03 DIAGNOSIS — F410 Panic disorder [episodic paroxysmal anxiety] without agoraphobia: Secondary | ICD-10-CM | POA: Diagnosis not present

## 2013-06-03 MED ORDER — AMLODIPINE BESYLATE 10 MG PO TABS: 10.0000 mg | ORAL_TABLET | Freq: Every day | ORAL | Status: DC

## 2013-06-03 NOTE — Progress Notes (Signed)
[address] [place], [place]  [address] Phone: [phone] Fax:  [phone]  Date:  06/03/2013   ID:  [name], DOB 02-02-1933, MRN [id number]  PCP:  Gretel Acre, MD  Cardiologist:  Armanda Magic, MD     History of Present Illness: [name] is a 79 y.o. female with a history of HTN. I saw her a week ago and she noticed that her HR had been slow the day before her OV. She denied any chest pain, SOB, DOE, LE edema, palpitations or syncope.She rarely gets dizziness but if she does it is usually at PT when she gets up too quickly. She says that she is chronically fatigue but she is on pain meds for chronic arthritic pain.  Her pulse was 41bpm in the office and she was instructed to stop her Atenolol.  Her BP was also running low so I decreased her amlodipine to 5mg  daily.  She presents back today for followup.  She is feeling better today but is very anxious and nervous.  She says that she has a lot on her mind and is worried about starting Remicaid, needs eye surgery and her daughter in law just passes away from an acute PE.  She brings in her chart of HR and BP readings for the past week.  Her bradycardia has resolved and is now in the 60-80bpm range.  Her BP is now in the 110-158/63-44mmHg.    Wt Readings from Last 3 Encounters:  05/23/13 157 lb (71.215 kg)  05/09/11 156 lb 12.8 oz (71.124 kg)  04/14/11 155 lb (70.308 kg)     Past Medical History  Diagnosis Date  . Asthma   . Depression   . GERD (gastroesophageal reflux disease)   . Arthritis     osteoarthritis  . Anxiety   . Osteopenia   . Candida esophagitis   . Diverticulosis   . Tubular adenoma of colon   . Osteoarthritis   . DJD (degenerative joint disease)      L5 compression fracture  . Gout   . Renal failure, unspecified   . Fibromyalgia   . C. difficile diarrhea   . Chronic sinusitis   . Panic disorder   . Spinal stenosis of lumbar region   . Hyperlipidemia   . Hypertension   .  Edema extremities   . Depression     Current Outpatient Prescriptions  Medication Sig Dispense Refill  . albuterol (PROVENTIL HFA;VENTOLIN HFA) 108 (90 BASE) MCG/ACT inhaler Inhale 2 puffs into the lungs every 4 (four) hours as needed. For shortness of breath.       . ALPRAZolam (XANAX) 0.5 MG tablet Take 0.5 mg by mouth 4 (four) times daily as needed. For anxiety      . amLODipine (NORVASC) 10 MG tablet Take 0.5 tablets (5 mg total) by mouth daily.      . Calcium Carbonate-Vitamin D (CALCIUM 600+D) 600-400 MG-UNIT per tablet Take 1 tablet by mouth 2 (two) times daily.      . diphenhydrAMINE (BENADRYL) 25 MG tablet Take 25 mg by mouth every 6 (six) hours as needed. For allergies      . EPINEPHrine (EPI-PEN) 0.3 mg/0.3 mL DEVI Inject 0.3 mg into the muscle as needed.      [name] escitalopram (LEXAPRO) 10 MG tablet Take 15 mg by mouth daily.      . fentaNYL (DURAGESIC - DOSED MCG/HR) 25 MCG/HR Place 1 patch onto the skin every 3 (three) days.      [name]  folic acid (FOLVITE) 1 MG tablet Take 2 mg by mouth daily.      [name] HYDROcodone-acetaminophen (NORCO/VICODIN) 5-325 MG per tablet Take 1 tablet by mouth every 6 (six) hours as needed for pain.      . hydrOXYzine (VISTARIL) 50 MG capsule at bedtime as needed.      [name] ibuprofen (ADVIL,MOTRIN) 800 MG tablet Take 800 mg by mouth 3 (three) times daily.      . methotrexate (RHEUMATREX) 2.5 MG tablet Take 10 mg by mouth once a week. Caution:Chemotherapy. Protect from light.      . Methotrexate Sodium, PF, 50 MG/2ML SOLN once a week.      [name] omeprazole (PRILOSEC) 20 MG capsule Take 20 mg by mouth daily.       . simvastatin (ZOCOR) 20 MG tablet Take 20 mg by mouth every morning.       . zoledronic acid (RECLAST) 5 MG/100ML SOLN Inject 5 mg into the vein. Once yearly. In april       No current facility-administered medications for this visit.    Allergies:    Allergies  Allergen Reactions  . Bee Venom Anaphylaxis    Has epi-pen  . Ciprofloxacin Anaphylaxis and  Palpitations  . Sulfonamide Derivatives Anaphylaxis  . Telithromycin Anaphylaxis    Reaction: also blurred vision   . Valsartan Anaphylaxis  . Verapamil Anaphylaxis  . Cymbalta [Duloxetine Hcl] Other (See Comments)    Reaction:Confusion and " I fall down"  . Gabapentin Other (See Comments)    Reaction: headache  . Lamotrigine     Patient unsure at this time  . Mirtazapine Other (See Comments)    unknown  . Morphine Other (See Comments)    Medication has no effect with pain  . Oxycodone-Acetaminophen Hives  . Pregabalin Swelling  . Prochlorperazine Edisylate   . Pseudoephedrine Other (See Comments)    Reaction: hyperventilates and blacks out  . Relafen [Nabumetone] Swelling  . Doxycycline Hives    Social History:  The patient  reports that she has never smoked. She has never used smokeless tobacco. She reports that she does not drink alcohol or use illicit drugs.   Family History:  The patient's family history includes Breast cancer in an other family member; Cancer in her father; Colon polyps in an other family member; Diabetes in her mother; Hypertension in her mother.   ROS:  Please see the history of present illness.      All other systems reviewed and negative.   PHYSICAL EXAM: VS:  There were no vitals taken for this visit. Well nourished, well developed, in no acute distress HEENT: normal Neck: no JVD Cardiac:  normal S1, S2; RRR; no murmur Lungs:  clear to auscultation bilaterally, no wheezing, rhonchi or rales Abd: soft, nontender, no hepatomegaly Ext: no edema Skin: warm and dry Neuro:  CNs 2-12 intact, no focal abnormalities noted         ASSESSMENT AND PLAN:  1. Symptomatic bradycardia - resolved off Atenolol 2. HTN  - BP mildly elevated after stopping Atenolol and decreasing amlodipine  - increase amlodipine to 10mg  daily  Followup with me in 3 months  Signed, Armanda Magic, MD 06/03/2013 10:55 AM

## 2013-06-03 NOTE — Patient Instructions (Signed)
Your physician has recommended you make the following change in your medication: Increase Amlodipine to 10 MG tab daily  Your physician recommends that you schedule a follow-up appointment in: 3 months with Dr. Mayford Knife

## 2013-06-04 DIAGNOSIS — M25519 Pain in unspecified shoulder: Secondary | ICD-10-CM | POA: Diagnosis not present

## 2013-06-05 DIAGNOSIS — J069 Acute upper respiratory infection, unspecified: Secondary | ICD-10-CM | POA: Diagnosis not present

## 2013-06-11 DIAGNOSIS — M25519 Pain in unspecified shoulder: Secondary | ICD-10-CM | POA: Diagnosis not present

## 2013-06-20 DIAGNOSIS — Z79899 Other long term (current) drug therapy: Secondary | ICD-10-CM | POA: Diagnosis not present

## 2013-06-20 DIAGNOSIS — M1710 Unilateral primary osteoarthritis, unspecified knee: Secondary | ICD-10-CM | POA: Diagnosis not present

## 2013-06-20 DIAGNOSIS — G894 Chronic pain syndrome: Secondary | ICD-10-CM | POA: Diagnosis not present

## 2013-06-20 DIAGNOSIS — M069 Rheumatoid arthritis, unspecified: Secondary | ICD-10-CM | POA: Diagnosis not present

## 2013-07-01 DIAGNOSIS — H18419 Arcus senilis, unspecified eye: Secondary | ICD-10-CM | POA: Diagnosis not present

## 2013-07-01 DIAGNOSIS — H2510 Age-related nuclear cataract, unspecified eye: Secondary | ICD-10-CM | POA: Diagnosis not present

## 2013-07-01 DIAGNOSIS — H25049 Posterior subcapsular polar age-related cataract, unspecified eye: Secondary | ICD-10-CM | POA: Diagnosis not present

## 2013-07-01 DIAGNOSIS — H25019 Cortical age-related cataract, unspecified eye: Secondary | ICD-10-CM | POA: Diagnosis not present

## 2013-07-01 DIAGNOSIS — H02839 Dermatochalasis of unspecified eye, unspecified eyelid: Secondary | ICD-10-CM | POA: Diagnosis not present

## 2013-07-04 DIAGNOSIS — F341 Dysthymic disorder: Secondary | ICD-10-CM | POA: Diagnosis not present

## 2013-07-04 DIAGNOSIS — F331 Major depressive disorder, recurrent, moderate: Secondary | ICD-10-CM | POA: Diagnosis not present

## 2013-07-04 DIAGNOSIS — F410 Panic disorder [episodic paroxysmal anxiety] without agoraphobia: Secondary | ICD-10-CM | POA: Diagnosis not present

## 2013-07-11 DIAGNOSIS — Z79899 Other long term (current) drug therapy: Secondary | ICD-10-CM | POA: Diagnosis not present

## 2013-07-11 DIAGNOSIS — M1990 Unspecified osteoarthritis, unspecified site: Secondary | ICD-10-CM | POA: Diagnosis not present

## 2013-07-11 DIAGNOSIS — M069 Rheumatoid arthritis, unspecified: Secondary | ICD-10-CM | POA: Diagnosis not present

## 2013-07-11 DIAGNOSIS — M2550 Pain in unspecified joint: Secondary | ICD-10-CM | POA: Diagnosis not present

## 2013-07-17 DIAGNOSIS — H269 Unspecified cataract: Secondary | ICD-10-CM | POA: Diagnosis not present

## 2013-07-17 DIAGNOSIS — H2510 Age-related nuclear cataract, unspecified eye: Secondary | ICD-10-CM | POA: Diagnosis not present

## 2013-07-19 DIAGNOSIS — M1710 Unilateral primary osteoarthritis, unspecified knee: Secondary | ICD-10-CM | POA: Diagnosis not present

## 2013-07-19 DIAGNOSIS — G894 Chronic pain syndrome: Secondary | ICD-10-CM | POA: Diagnosis not present

## 2013-07-19 DIAGNOSIS — M2550 Pain in unspecified joint: Secondary | ICD-10-CM | POA: Diagnosis not present

## 2013-07-19 DIAGNOSIS — M069 Rheumatoid arthritis, unspecified: Secondary | ICD-10-CM | POA: Diagnosis not present

## 2013-07-23 DIAGNOSIS — H2510 Age-related nuclear cataract, unspecified eye: Secondary | ICD-10-CM | POA: Diagnosis not present

## 2013-07-23 DIAGNOSIS — H269 Unspecified cataract: Secondary | ICD-10-CM | POA: Diagnosis not present

## 2013-08-05 DIAGNOSIS — F3342 Major depressive disorder, recurrent, in full remission: Secondary | ICD-10-CM | POA: Diagnosis not present

## 2013-08-05 DIAGNOSIS — F410 Panic disorder [episodic paroxysmal anxiety] without agoraphobia: Secondary | ICD-10-CM | POA: Diagnosis not present

## 2013-08-06 DIAGNOSIS — H04129 Dry eye syndrome of unspecified lacrimal gland: Secondary | ICD-10-CM | POA: Diagnosis not present

## 2013-08-13 DIAGNOSIS — F410 Panic disorder [episodic paroxysmal anxiety] without agoraphobia: Secondary | ICD-10-CM | POA: Diagnosis not present

## 2013-08-13 DIAGNOSIS — F3342 Major depressive disorder, recurrent, in full remission: Secondary | ICD-10-CM | POA: Diagnosis not present

## 2013-08-13 DIAGNOSIS — F341 Dysthymic disorder: Secondary | ICD-10-CM | POA: Diagnosis not present

## 2013-08-19 DIAGNOSIS — M1710 Unilateral primary osteoarthritis, unspecified knee: Secondary | ICD-10-CM | POA: Diagnosis not present

## 2013-08-19 DIAGNOSIS — M069 Rheumatoid arthritis, unspecified: Secondary | ICD-10-CM | POA: Diagnosis not present

## 2013-08-19 DIAGNOSIS — M2550 Pain in unspecified joint: Secondary | ICD-10-CM | POA: Diagnosis not present

## 2013-08-19 DIAGNOSIS — G894 Chronic pain syndrome: Secondary | ICD-10-CM | POA: Diagnosis not present

## 2013-08-26 ENCOUNTER — Other Ambulatory Visit: Payer: Self-pay

## 2013-08-26 MED ORDER — AMLODIPINE BESYLATE 10 MG PO TABS: 10.0000 mg | ORAL_TABLET | Freq: Every day | ORAL | Status: DC

## 2013-08-30 DIAGNOSIS — M2550 Pain in unspecified joint: Secondary | ICD-10-CM | POA: Diagnosis not present

## 2013-08-30 DIAGNOSIS — M1710 Unilateral primary osteoarthritis, unspecified knee: Secondary | ICD-10-CM | POA: Diagnosis not present

## 2013-08-30 DIAGNOSIS — G894 Chronic pain syndrome: Secondary | ICD-10-CM | POA: Diagnosis not present

## 2013-08-30 DIAGNOSIS — M069 Rheumatoid arthritis, unspecified: Secondary | ICD-10-CM | POA: Diagnosis not present

## 2013-09-10 ENCOUNTER — Encounter: Payer: Self-pay | Admitting: Cardiology

## 2013-09-10 ENCOUNTER — Ambulatory Visit (INDEPENDENT_AMBULATORY_CARE_PROVIDER_SITE_OTHER): Payer: Medicare Other | Admitting: Cardiology

## 2013-09-10 VITALS — BP 120/60 | HR 80 | Ht 61.0 in | Wt 166.1 lb

## 2013-09-10 DIAGNOSIS — I10 Essential (primary) hypertension: Secondary | ICD-10-CM | POA: Diagnosis not present

## 2013-09-10 DIAGNOSIS — I498 Other specified cardiac arrhythmias: Secondary | ICD-10-CM | POA: Diagnosis not present

## 2013-09-10 DIAGNOSIS — R001 Bradycardia, unspecified: Secondary | ICD-10-CM

## 2013-09-10 MED ORDER — AMLODIPINE BESYLATE 10 MG PO TABS: 10.0000 mg | ORAL_TABLET | Freq: Every day | ORAL | Status: DC

## 2013-09-10 NOTE — Patient Instructions (Signed)
Your physician recommends that you continue on your current medications as directed. Please refer to the Current Medication list given to you today.  Your physician wants you to follow-up in: 6 Months with Dr Turner You will receive a reminder letter in the mail two months in advance. If you don't receive a letter, please call our office to schedule the follow-up appointment.  

## 2013-09-10 NOTE — Progress Notes (Signed)
[place], [place], [place]  [address] Phone: [phone] Fax:  [phone]  Date:  09/10/2013   ID:  JAKERRIA KINGBIRD, DOB 1933-03-18, MRN [id number]  PCP:  Gavin Pound, MD  Cardiologist:  Fransico Him, MD  HPI: [name] is a 80 y.o. female with a history of HTN and asymptomatic bradycardia who presents today for followup.  She has a history of bradycardia and her beta blocker had to be stopped.  She is doing well today.  She denies any chest pain, SOB, DOE, LE edema, dizziness, palpitations or syncope.      Wt Readings from Last 3 Encounters:  09/10/13 166 lb 1.9 oz (75.352 kg)  06/03/13 156 lb (70.761 kg)  05/23/13 157 lb (71.215 kg)     Past Medical History  Diagnosis Date  . Asthma   . Depression   . GERD (gastroesophageal reflux disease)   . Arthritis     osteoarthritis  . Anxiety   . Osteopenia   . Candida esophagitis   . Diverticulosis   . Tubular adenoma of colon   . Osteoarthritis   . DJD (degenerative joint disease)      L5 compression fracture  . Gout   . Renal failure, unspecified   . Fibromyalgia   . C. difficile diarrhea   . Chronic sinusitis   . Panic disorder   . Spinal stenosis of lumbar region   . Hyperlipidemia   . Hypertension   . Edema extremities   . Depression     Current Outpatient Prescriptions  Medication Sig Dispense Refill  . albuterol (PROVENTIL HFA;VENTOLIN HFA) 108 (90 BASE) MCG/ACT inhaler Inhale 2 puffs into the lungs every 4 (four) hours as needed. For shortness of breath.       . ALPRAZolam (XANAX) 0.5 MG tablet Take 0.5 mg by mouth 4 (four) times daily as needed. For anxiety      . amLODipine (NORVASC) 10 MG tablet Take 1 tablet (10 mg total) by mouth daily.  90 tablet  2  . Calcium Carbonate-Vitamin D (CALCIUM 600+D) 600-400 MG-UNIT per tablet Take 1 tablet by mouth 2 (two) times daily.      . diphenhydrAMINE (BENADRYL) 25 MG tablet Take 25 mg by mouth every 6 (six) hours as needed. For allergies       . EPINEPHrine (EPI-PEN) 0.3 mg/0.3 mL DEVI Inject 0.3 mg into the muscle as needed.      [name] escitalopram (LEXAPRO) 10 MG tablet Take 15 mg by mouth daily.      . fentaNYL (DURAGESIC - DOSED MCG/HR) 25 MCG/HR Place 1 patch onto the skin every 3 (three) days.      . folic acid (FOLVITE) 1 MG tablet Take 2 mg by mouth daily.      [name] HYDROcodone-acetaminophen (NORCO/VICODIN) 5-325 MG per tablet Take 1 tablet by mouth every 6 (six) hours as needed for pain.      . hydrOXYzine (VISTARIL) 50 MG capsule at bedtime as needed.      [name] ibuprofen (ADVIL,MOTRIN) 800 MG tablet Take 800 mg by mouth 3 (three) times daily.      . methotrexate (RHEUMATREX) 2.5 MG tablet Take 10 mg by mouth once a week. Caution:Chemotherapy. Protect from light.      . Methotrexate Sodium, PF, 50 MG/2ML SOLN once a week.      [name] omeprazole (PRILOSEC) 20 MG capsule Take 20 mg by mouth daily.       . simvastatin (ZOCOR) 20 MG  tablet Take 20 mg by mouth every morning.       . zoledronic acid (RECLAST) 5 MG/100ML SOLN Inject 5 mg into the vein. Once yearly. In april       No current facility-administered medications for this visit.    Allergies:    Allergies  Allergen Reactions  . Bee Venom Anaphylaxis    Has epi-pen  . Ciprofloxacin Anaphylaxis and Palpitations  . Sulfonamide Derivatives Anaphylaxis  . Telithromycin Anaphylaxis    Reaction: also blurred vision   . Valsartan Anaphylaxis  . Verapamil Anaphylaxis  . Cymbalta [Duloxetine Hcl] Other (See Comments)    Reaction:Confusion and " I fall down"  . Gabapentin Other (See Comments)    Reaction: headache  . Lamotrigine     Patient unsure at this time  . Mirtazapine Other (See Comments)    unknown  . Morphine Other (See Comments)    Medication has no effect with pain  . Oxycodone-Acetaminophen Hives  . Pregabalin Swelling  . Prochlorperazine Edisylate   . Pseudoephedrine Other (See Comments)    Reaction: hyperventilates and blacks out  . Relafen [Nabumetone]  Swelling  . Doxycycline Hives    Social History:  The patient  reports that she has never smoked. She has never used smokeless tobacco. She reports that she does not drink alcohol or use illicit drugs.   Family History:  The patient's family history includes Breast cancer in an other family member; Cancer in her father; Colon polyps in an other family member; Diabetes in her mother; Hypertension in her mother.   ROS:  Please see the history of present illness.      All other systems reviewed and negative.   PHYSICAL EXAM: VS:  BP 120/60  Pulse 80  Ht 5\' 1"  (1.549 m)  Wt 166 lb 1.9 oz (75.352 kg)  BMI 31.40 kg/m2 Well nourished, well developed, in no acute distress HEENT: normal Neck: no JVD Cardiac:  normal S1, S2; RRR; no murmur Lungs:  clear to auscultation bilaterally, no wheezing, rhonchi or rales Abd: soft, nontender, no hepatomegaly Ext: no edema Skin: warm and dry Neuro:  CNs 2-12 intact, no focal abnormalities noted       ASSESSMENT AND PLAN:  1. Bradycardia - resolved off beta blockers 2. HTN - well controlled   - continue amlodipine  Followup with me in 6 months  Signed, Fransico Him, MD 09/10/2013 9:19 AM

## 2013-09-11 DIAGNOSIS — F341 Dysthymic disorder: Secondary | ICD-10-CM | POA: Diagnosis not present

## 2013-09-11 DIAGNOSIS — F331 Major depressive disorder, recurrent, moderate: Secondary | ICD-10-CM | POA: Diagnosis not present

## 2013-09-30 DIAGNOSIS — E785 Hyperlipidemia, unspecified: Secondary | ICD-10-CM | POA: Diagnosis not present

## 2013-09-30 DIAGNOSIS — M1710 Unilateral primary osteoarthritis, unspecified knee: Secondary | ICD-10-CM | POA: Diagnosis not present

## 2013-09-30 DIAGNOSIS — M069 Rheumatoid arthritis, unspecified: Secondary | ICD-10-CM | POA: Diagnosis not present

## 2013-09-30 DIAGNOSIS — G894 Chronic pain syndrome: Secondary | ICD-10-CM | POA: Diagnosis not present

## 2013-09-30 DIAGNOSIS — M2550 Pain in unspecified joint: Secondary | ICD-10-CM | POA: Diagnosis not present

## 2013-09-30 DIAGNOSIS — M899 Disorder of bone, unspecified: Secondary | ICD-10-CM | POA: Diagnosis not present

## 2013-10-02 DIAGNOSIS — E785 Hyperlipidemia, unspecified: Secondary | ICD-10-CM | POA: Diagnosis not present

## 2013-10-02 DIAGNOSIS — I10 Essential (primary) hypertension: Secondary | ICD-10-CM | POA: Diagnosis not present

## 2013-10-02 DIAGNOSIS — M810 Age-related osteoporosis without current pathological fracture: Secondary | ICD-10-CM | POA: Diagnosis not present

## 2013-10-02 DIAGNOSIS — K219 Gastro-esophageal reflux disease without esophagitis: Secondary | ICD-10-CM | POA: Diagnosis not present

## 2013-10-02 DIAGNOSIS — F410 Panic disorder [episodic paroxysmal anxiety] without agoraphobia: Secondary | ICD-10-CM | POA: Diagnosis not present

## 2013-10-02 DIAGNOSIS — F341 Dysthymic disorder: Secondary | ICD-10-CM | POA: Diagnosis not present

## 2013-10-02 DIAGNOSIS — Z91038 Other insect allergy status: Secondary | ICD-10-CM | POA: Diagnosis not present

## 2013-10-02 DIAGNOSIS — F331 Major depressive disorder, recurrent, moderate: Secondary | ICD-10-CM | POA: Diagnosis not present

## 2013-10-02 LAB — LIPID PANEL
CHOLESTEROL: 117 mg/dL (ref 0–200)
LDL Cholesterol: 52 mg/dL
TRIGLYCERIDES: 162 mg/dL — AB (ref 40–160)

## 2013-10-02 LAB — BASIC METABOLIC PANEL
BUN: 10 mg/dL (ref 4–21)
CREATININE: 1 mg/dL (ref 0.5–1.1)
Glucose: 104 mg/dL
POTASSIUM: 4.2 mmol/L (ref 3.4–5.3)
Sodium: 141 mmol/L (ref 137–147)

## 2013-10-02 LAB — BMP8+EGFR
Calcium: 9.8 mg/dL
EGFR: 55 mg/dL

## 2013-10-02 LAB — HEPATIC FUNCTION PANEL
ALT: 31 U/L (ref 7–35)
AST: 31 U/L (ref 13–35)

## 2013-10-02 LAB — CBC AND DIFFERENTIAL
Hemoglobin: 15.7 g/dL (ref 12.0–16.0)
Platelets: 169 10*3/uL (ref 150–399)

## 2013-10-10 DIAGNOSIS — M2550 Pain in unspecified joint: Secondary | ICD-10-CM | POA: Diagnosis not present

## 2013-10-10 DIAGNOSIS — M069 Rheumatoid arthritis, unspecified: Secondary | ICD-10-CM | POA: Diagnosis not present

## 2013-10-10 DIAGNOSIS — M1990 Unspecified osteoarthritis, unspecified site: Secondary | ICD-10-CM | POA: Diagnosis not present

## 2013-10-10 DIAGNOSIS — Z79899 Other long term (current) drug therapy: Secondary | ICD-10-CM | POA: Diagnosis not present

## 2013-10-12 DIAGNOSIS — H1045 Other chronic allergic conjunctivitis: Secondary | ICD-10-CM | POA: Diagnosis not present

## 2013-10-29 DIAGNOSIS — M2550 Pain in unspecified joint: Secondary | ICD-10-CM | POA: Diagnosis not present

## 2013-10-29 DIAGNOSIS — G894 Chronic pain syndrome: Secondary | ICD-10-CM | POA: Diagnosis not present

## 2013-10-29 DIAGNOSIS — M1710 Unilateral primary osteoarthritis, unspecified knee: Secondary | ICD-10-CM | POA: Diagnosis not present

## 2013-10-29 DIAGNOSIS — M069 Rheumatoid arthritis, unspecified: Secondary | ICD-10-CM | POA: Diagnosis not present

## 2013-10-31 DIAGNOSIS — F341 Dysthymic disorder: Secondary | ICD-10-CM | POA: Diagnosis not present

## 2013-10-31 DIAGNOSIS — F331 Major depressive disorder, recurrent, moderate: Secondary | ICD-10-CM | POA: Diagnosis not present

## 2013-11-05 DIAGNOSIS — M2550 Pain in unspecified joint: Secondary | ICD-10-CM | POA: Diagnosis not present

## 2013-11-05 DIAGNOSIS — M1990 Unspecified osteoarthritis, unspecified site: Secondary | ICD-10-CM | POA: Diagnosis not present

## 2013-11-05 DIAGNOSIS — R110 Nausea: Secondary | ICD-10-CM | POA: Diagnosis not present

## 2013-11-05 DIAGNOSIS — M069 Rheumatoid arthritis, unspecified: Secondary | ICD-10-CM | POA: Diagnosis not present

## 2013-11-06 DIAGNOSIS — F341 Dysthymic disorder: Secondary | ICD-10-CM | POA: Diagnosis not present

## 2013-11-06 DIAGNOSIS — F410 Panic disorder [episodic paroxysmal anxiety] without agoraphobia: Secondary | ICD-10-CM | POA: Diagnosis not present

## 2013-11-06 DIAGNOSIS — F331 Major depressive disorder, recurrent, moderate: Secondary | ICD-10-CM | POA: Diagnosis not present

## 2013-11-11 ENCOUNTER — Encounter: Payer: Self-pay | Admitting: Family Medicine

## 2013-11-13 DIAGNOSIS — F331 Major depressive disorder, recurrent, moderate: Secondary | ICD-10-CM | POA: Diagnosis not present

## 2013-11-13 DIAGNOSIS — F4322 Adjustment disorder with anxiety: Secondary | ICD-10-CM | POA: Diagnosis not present

## 2013-11-13 DIAGNOSIS — F341 Dysthymic disorder: Secondary | ICD-10-CM | POA: Diagnosis not present

## 2013-11-13 DIAGNOSIS — F410 Panic disorder [episodic paroxysmal anxiety] without agoraphobia: Secondary | ICD-10-CM | POA: Diagnosis not present

## 2013-11-15 ENCOUNTER — Encounter: Payer: Self-pay | Admitting: Family Medicine

## 2013-11-26 DIAGNOSIS — M069 Rheumatoid arthritis, unspecified: Secondary | ICD-10-CM | POA: Diagnosis not present

## 2013-11-26 DIAGNOSIS — G894 Chronic pain syndrome: Secondary | ICD-10-CM | POA: Diagnosis not present

## 2013-11-26 DIAGNOSIS — M2550 Pain in unspecified joint: Secondary | ICD-10-CM | POA: Diagnosis not present

## 2013-11-26 DIAGNOSIS — M1710 Unilateral primary osteoarthritis, unspecified knee: Secondary | ICD-10-CM | POA: Diagnosis not present

## 2013-11-28 DIAGNOSIS — F410 Panic disorder [episodic paroxysmal anxiety] without agoraphobia: Secondary | ICD-10-CM | POA: Diagnosis not present

## 2013-11-28 DIAGNOSIS — F341 Dysthymic disorder: Secondary | ICD-10-CM | POA: Diagnosis not present

## 2013-11-28 DIAGNOSIS — F331 Major depressive disorder, recurrent, moderate: Secondary | ICD-10-CM | POA: Diagnosis not present

## 2013-12-05 DIAGNOSIS — M1990 Unspecified osteoarthritis, unspecified site: Secondary | ICD-10-CM | POA: Diagnosis not present

## 2013-12-05 DIAGNOSIS — Z79899 Other long term (current) drug therapy: Secondary | ICD-10-CM | POA: Diagnosis not present

## 2013-12-05 DIAGNOSIS — M069 Rheumatoid arthritis, unspecified: Secondary | ICD-10-CM | POA: Diagnosis not present

## 2013-12-05 DIAGNOSIS — M2550 Pain in unspecified joint: Secondary | ICD-10-CM | POA: Diagnosis not present

## 2013-12-06 DIAGNOSIS — G894 Chronic pain syndrome: Secondary | ICD-10-CM | POA: Diagnosis not present

## 2013-12-06 DIAGNOSIS — M1710 Unilateral primary osteoarthritis, unspecified knee: Secondary | ICD-10-CM | POA: Diagnosis not present

## 2013-12-06 DIAGNOSIS — M2550 Pain in unspecified joint: Secondary | ICD-10-CM | POA: Diagnosis not present

## 2013-12-06 DIAGNOSIS — M069 Rheumatoid arthritis, unspecified: Secondary | ICD-10-CM | POA: Diagnosis not present

## 2013-12-19 DIAGNOSIS — IMO0002 Reserved for concepts with insufficient information to code with codable children: Secondary | ICD-10-CM | POA: Diagnosis not present

## 2013-12-19 DIAGNOSIS — F410 Panic disorder [episodic paroxysmal anxiety] without agoraphobia: Secondary | ICD-10-CM | POA: Diagnosis not present

## 2013-12-24 DIAGNOSIS — M2550 Pain in unspecified joint: Secondary | ICD-10-CM | POA: Diagnosis not present

## 2013-12-24 DIAGNOSIS — M1710 Unilateral primary osteoarthritis, unspecified knee: Secondary | ICD-10-CM | POA: Diagnosis not present

## 2013-12-24 DIAGNOSIS — M069 Rheumatoid arthritis, unspecified: Secondary | ICD-10-CM | POA: Diagnosis not present

## 2013-12-24 DIAGNOSIS — G894 Chronic pain syndrome: Secondary | ICD-10-CM | POA: Diagnosis not present

## 2014-01-01 DIAGNOSIS — F331 Major depressive disorder, recurrent, moderate: Secondary | ICD-10-CM | POA: Diagnosis not present

## 2014-01-01 DIAGNOSIS — F410 Panic disorder [episodic paroxysmal anxiety] without agoraphobia: Secondary | ICD-10-CM | POA: Diagnosis not present

## 2014-01-01 DIAGNOSIS — F341 Dysthymic disorder: Secondary | ICD-10-CM | POA: Diagnosis not present

## 2014-01-04 DIAGNOSIS — R350 Frequency of micturition: Secondary | ICD-10-CM | POA: Diagnosis not present

## 2014-01-04 DIAGNOSIS — N3090 Cystitis, unspecified without hematuria: Secondary | ICD-10-CM | POA: Diagnosis not present

## 2014-01-07 DIAGNOSIS — R197 Diarrhea, unspecified: Secondary | ICD-10-CM | POA: Diagnosis not present

## 2014-01-23 DIAGNOSIS — G894 Chronic pain syndrome: Secondary | ICD-10-CM | POA: Diagnosis not present

## 2014-01-23 DIAGNOSIS — M2550 Pain in unspecified joint: Secondary | ICD-10-CM | POA: Diagnosis not present

## 2014-01-23 DIAGNOSIS — Z79899 Other long term (current) drug therapy: Secondary | ICD-10-CM | POA: Diagnosis not present

## 2014-01-23 DIAGNOSIS — M1710 Unilateral primary osteoarthritis, unspecified knee: Secondary | ICD-10-CM | POA: Diagnosis not present

## 2014-01-23 DIAGNOSIS — M069 Rheumatoid arthritis, unspecified: Secondary | ICD-10-CM | POA: Diagnosis not present

## 2014-01-27 DIAGNOSIS — M810 Age-related osteoporosis without current pathological fracture: Secondary | ICD-10-CM | POA: Diagnosis not present

## 2014-02-06 DIAGNOSIS — M069 Rheumatoid arthritis, unspecified: Secondary | ICD-10-CM | POA: Diagnosis not present

## 2014-02-10 DIAGNOSIS — G894 Chronic pain syndrome: Secondary | ICD-10-CM | POA: Diagnosis not present

## 2014-02-10 DIAGNOSIS — M1710 Unilateral primary osteoarthritis, unspecified knee: Secondary | ICD-10-CM | POA: Diagnosis not present

## 2014-02-10 DIAGNOSIS — M2550 Pain in unspecified joint: Secondary | ICD-10-CM | POA: Diagnosis not present

## 2014-02-10 DIAGNOSIS — M069 Rheumatoid arthritis, unspecified: Secondary | ICD-10-CM | POA: Diagnosis not present

## 2014-02-12 DIAGNOSIS — F331 Major depressive disorder, recurrent, moderate: Secondary | ICD-10-CM | POA: Diagnosis not present

## 2014-02-18 DIAGNOSIS — M899 Disorder of bone, unspecified: Secondary | ICD-10-CM | POA: Diagnosis not present

## 2014-02-18 DIAGNOSIS — M259 Joint disorder, unspecified: Secondary | ICD-10-CM | POA: Diagnosis not present

## 2014-02-18 DIAGNOSIS — F410 Panic disorder [episodic paroxysmal anxiety] without agoraphobia: Secondary | ICD-10-CM | POA: Diagnosis not present

## 2014-02-18 DIAGNOSIS — M949 Disorder of cartilage, unspecified: Secondary | ICD-10-CM | POA: Diagnosis not present

## 2014-02-18 DIAGNOSIS — M19039 Primary osteoarthritis, unspecified wrist: Secondary | ICD-10-CM | POA: Diagnosis not present

## 2014-02-18 DIAGNOSIS — M069 Rheumatoid arthritis, unspecified: Secondary | ICD-10-CM | POA: Diagnosis not present

## 2014-02-18 DIAGNOSIS — F331 Major depressive disorder, recurrent, moderate: Secondary | ICD-10-CM | POA: Diagnosis not present

## 2014-02-18 DIAGNOSIS — M159 Polyosteoarthritis, unspecified: Secondary | ICD-10-CM | POA: Diagnosis not present

## 2014-02-18 DIAGNOSIS — M810 Age-related osteoporosis without current pathological fracture: Secondary | ICD-10-CM | POA: Diagnosis not present

## 2014-02-18 DIAGNOSIS — F341 Dysthymic disorder: Secondary | ICD-10-CM | POA: Diagnosis not present

## 2014-02-18 DIAGNOSIS — Z79899 Other long term (current) drug therapy: Secondary | ICD-10-CM | POA: Diagnosis not present

## 2014-02-19 ENCOUNTER — Ambulatory Visit (INDEPENDENT_AMBULATORY_CARE_PROVIDER_SITE_OTHER): Payer: Medicare Other | Admitting: Family Medicine

## 2014-02-19 ENCOUNTER — Encounter: Payer: Self-pay | Admitting: Family Medicine

## 2014-02-19 VITALS — BP 120/65 | HR 78 | Ht 60.0 in | Wt 141.0 lb

## 2014-02-19 DIAGNOSIS — F32A Depression, unspecified: Secondary | ICD-10-CM

## 2014-02-19 DIAGNOSIS — K5900 Constipation, unspecified: Secondary | ICD-10-CM

## 2014-02-19 DIAGNOSIS — F329 Major depressive disorder, single episode, unspecified: Secondary | ICD-10-CM

## 2014-02-19 DIAGNOSIS — M069 Rheumatoid arthritis, unspecified: Secondary | ICD-10-CM | POA: Diagnosis not present

## 2014-02-19 DIAGNOSIS — F341 Dysthymic disorder: Secondary | ICD-10-CM

## 2014-02-19 DIAGNOSIS — I10 Essential (primary) hypertension: Secondary | ICD-10-CM

## 2014-02-19 DIAGNOSIS — F411 Generalized anxiety disorder: Secondary | ICD-10-CM | POA: Insufficient documentation

## 2014-02-19 DIAGNOSIS — M797 Fibromyalgia: Secondary | ICD-10-CM

## 2014-02-19 DIAGNOSIS — IMO0001 Reserved for inherently not codable concepts without codable children: Secondary | ICD-10-CM | POA: Diagnosis not present

## 2014-02-19 DIAGNOSIS — F419 Anxiety disorder, unspecified: Secondary | ICD-10-CM

## 2014-02-19 NOTE — Progress Notes (Signed)
CC: [name] is a 80 y.o. female is here for Establish Care   Subjective: HPI:  Pleasant 80 year old here to establish care  She reports a history of essential hypertension complicated by bradycardia and a long allergy list. She's currently on amlodipine 10 mg daily with no outside blood pressures to report that she believes she's been normotensive for over a year and on this regimen. She tries to stick to a low salt diet no formal exercise routine. She denies any cardiac disease or cardiovascular disease other than bradycardia  Reports a history of fibromyalgia and rheumatoid arthritis. Currently this is being managed by Dr. Macky Lower, patient recently switched from oral methotrexate to injectable methotrexate she is also taking folic acid on a daily basis. She sees a pain clinic in [place] and currently is using a fentanyl patch with as needed hydrocodone.  She reports frustration with alternating constipation and diarrhea. Constipation symptoms are worsened by the degree of narcotics that she's taking in on a day-to-day basis. No current interventions. Sounds like bowel movements can be spaced out to every 2-3 days and are hard to pass. No recent rectal bleeding or melena. She's been dealing with this for years now and moderate in severity a daily basis  She reports a history of anxiety and depression which is her biggest uncontrolled complaints at this time. Anxiety is well controlled with as needed Xanax however she believes the depression has been worsening over the past month. Symptoms have been worsened by recent move to a new house that she does not like. Describes symptoms of lack of interest in hobbies and lack of interest in socializing with others does not appear that there's been any thoughts wanting to harm herself or others. She tells me she's been battling depression since her early 21s. Describes symptoms currently as mild/moderate in severity. Continues on Lexapro  daily  Review of Systems - General ROS: negative for - chills, fever, night sweats, weight gain or weight loss Ophthalmic ROS: negative for - decreased vision Psychological ROS: negative for - anxiety or depression ENT ROS: negative for - hearing change, nasal congestion, tinnitus or allergies Hematological and Lymphatic ROS: negative for - bleeding problems, bruising or swollen lymph nodes Breast ROS: negative Respiratory ROS: no cough, shortness of breath, or wheezing Cardiovascular ROS: no chest pain or dyspnea on exertion Gastrointestinal ROS: no abdominal pain,  or black or bloody stools Genito-Urinary ROS: negative for - genital discharge, genital ulcers, incontinence or abnormal bleeding from genitals Musculoskeletal ROS: negative for -muscle pain Neurological ROS: negative for - headaches or memory loss Dermatological ROS: negative for lumps, mole changes, rash and skin lesion changes  Past Medical History  Diagnosis Date  . Asthma   . Depression   . GERD (gastroesophageal reflux disease)   . Arthritis     osteoarthritis  . Anxiety   . Osteopenia   . Candida esophagitis   . Diverticulosis   . Tubular adenoma of colon   . Osteoarthritis   . DJD (degenerative joint disease)      L5 compression fracture  . Gout   . Renal failure, unspecified   . Fibromyalgia   . C. difficile diarrhea   . Chronic sinusitis   . Panic disorder   . Spinal stenosis of lumbar region   . Hyperlipidemia   . Hypertension   . Edema extremities   . Depression     Past Surgical History  Procedure Laterality Date  . Total hip arthroplasty  2004  right  . Total knee arthroplasty  2006    right  . Carpal tunnel release  2007, 2009    Bilateral  . Shoulder surgery  2004, 07/30/10, 01/2011    after her right humeral neck fracture, revision hemiarthroplasty 2004 for persistent pain, revision reverse arthroplasty December 2011 for persistent pain, incision and drainage with poly-exchange  01/26/2011 for possible prosthetic joint infection.  . Total abdominal hysterectomy    . Ganglion cyst  wrist    . Ganglion cyct removal on fingers      right  . Tonsillectomy and adenoidectomy    . Nasal sinus surgery    . Total shoulder replacement  2002    right  . Knee arthroscopy      right  . Cataract extraction, bilateral     Family History  Problem Relation Age of Onset  . Breast cancer    . Colon polyps    . Cancer Father     bladder  . Diabetes Mother   . Hypertension Mother     History   Social History  . Marital Status: Married    Spouse Name: N/A    Number of Children: N/A  . Years of Education: N/A   Occupational History  . Not on file.   Social History Main Topics  . Smoking status: Never Smoker   . Smokeless tobacco: Never Used  . Alcohol Use: No  . Drug Use: No  . Sexual Activity: Not on file   Other Topics Concern  . Not on file   Social History Narrative  . No narrative on file     Objective: BP 120/65  Pulse 78  Ht 5' (1.524 m)  Wt 141 lb (63.957 kg)  BMI 27.54 kg/m2  General: Alert and Oriented, No Acute Distress HEENT: Pupils equal, round, reactive to light. Conjunctivae clear.  Moist because membranes pharynx are unremarkable Lungs: Clear to auscultation bilaterally, no wheezing/ronchi/rales.  Comfortable work of breathing. Good air movement. Cardiac: Regular rate and rhythm. Normal S1/S2.  No murmurs, rubs, nor gallops.   Abdomen: Obese soft nontender Extremities: No peripheral edema.  Strong peripheral pulses.  Mental Status: No depression, anxiety, nor agitation. Skin: Warm and dry.  Assessment & Plan: [name] was seen today for establish care.  Diagnoses and associated orders for this visit:  Chronic rheumatic arthritis  Fibromyalgia  Essential hypertension  Anxiety and depression  Constipation, unspecified constipation type    Chronic rheumatoid arthritis and fibromyalgia: Deferred to Dr. Otho Ket and pain  management clinic Essential hypertension: Controlled continue amlodipine Constipation: Discussed Metamucil or psyllium powder to increase fiber in her diet, considering Linzess or Amitiza if no improvement by the time she follows up Anxiety: Controlled Depression: Uncontrolled, I've offered her to start her on Abilify however she would prefer to discuss it with her psychiatrist first  Return in about 3 months (around 05/22/2014) for Routine Follow Up.

## 2014-03-10 DIAGNOSIS — M2550 Pain in unspecified joint: Secondary | ICD-10-CM | POA: Diagnosis not present

## 2014-03-10 DIAGNOSIS — M069 Rheumatoid arthritis, unspecified: Secondary | ICD-10-CM | POA: Diagnosis not present

## 2014-03-10 DIAGNOSIS — G894 Chronic pain syndrome: Secondary | ICD-10-CM | POA: Diagnosis not present

## 2014-03-10 DIAGNOSIS — M1710 Unilateral primary osteoarthritis, unspecified knee: Secondary | ICD-10-CM | POA: Diagnosis not present

## 2014-03-12 ENCOUNTER — Encounter: Payer: Self-pay | Admitting: Family Medicine

## 2014-03-12 DIAGNOSIS — M810 Age-related osteoporosis without current pathological fracture: Secondary | ICD-10-CM | POA: Insufficient documentation

## 2014-03-12 DIAGNOSIS — Z860100 Personal history of colon polyps, unspecified: Secondary | ICD-10-CM | POA: Insufficient documentation

## 2014-03-17 ENCOUNTER — Encounter: Payer: Self-pay | Admitting: Family Medicine

## 2014-03-21 DIAGNOSIS — M545 Low back pain, unspecified: Secondary | ICD-10-CM | POA: Diagnosis not present

## 2014-03-21 DIAGNOSIS — M069 Rheumatoid arthritis, unspecified: Secondary | ICD-10-CM | POA: Diagnosis not present

## 2014-03-21 DIAGNOSIS — Z79899 Other long term (current) drug therapy: Secondary | ICD-10-CM | POA: Diagnosis not present

## 2014-03-21 DIAGNOSIS — M130 Polyarthritis, unspecified: Secondary | ICD-10-CM | POA: Diagnosis not present

## 2014-03-25 DIAGNOSIS — H1045 Other chronic allergic conjunctivitis: Secondary | ICD-10-CM | POA: Diagnosis not present

## 2014-03-25 DIAGNOSIS — F4322 Adjustment disorder with anxiety: Secondary | ICD-10-CM | POA: Diagnosis not present

## 2014-04-01 DIAGNOSIS — F331 Major depressive disorder, recurrent, moderate: Secondary | ICD-10-CM | POA: Diagnosis not present

## 2014-04-04 ENCOUNTER — Ambulatory Visit: Payer: Medicare Other | Admitting: Cardiology

## 2014-04-10 ENCOUNTER — Encounter: Payer: Self-pay | Admitting: Emergency Medicine

## 2014-04-10 ENCOUNTER — Encounter: Payer: Self-pay | Admitting: Cardiology

## 2014-04-10 ENCOUNTER — Ambulatory Visit (INDEPENDENT_AMBULATORY_CARE_PROVIDER_SITE_OTHER): Payer: Medicare Other | Admitting: Cardiology

## 2014-04-10 ENCOUNTER — Emergency Department (INDEPENDENT_AMBULATORY_CARE_PROVIDER_SITE_OTHER)
Admission: EM | Admit: 2014-04-10 | Discharge: 2014-04-10 | Disposition: A | Discharge status: Home / Self Care | Payer: Medicare Other | Source: Home / Self Care | Attending: Emergency Medicine | Admitting: Emergency Medicine

## 2014-04-10 VITALS — BP 130/74 | HR 83 | Ht 60.0 in | Wt 152.8 lb

## 2014-04-10 DIAGNOSIS — Z23 Encounter for immunization: Secondary | ICD-10-CM

## 2014-04-10 DIAGNOSIS — L03119 Cellulitis of unspecified part of limb: Secondary | ICD-10-CM

## 2014-04-10 DIAGNOSIS — L02519 Cutaneous abscess of unspecified hand: Secondary | ICD-10-CM

## 2014-04-10 DIAGNOSIS — I498 Other specified cardiac arrhythmias: Secondary | ICD-10-CM | POA: Diagnosis not present

## 2014-04-10 DIAGNOSIS — I10 Essential (primary) hypertension: Secondary | ICD-10-CM | POA: Diagnosis not present

## 2014-04-10 DIAGNOSIS — R001 Bradycardia, unspecified: Secondary | ICD-10-CM

## 2014-04-10 MED ORDER — INFLUENZA VAC SPLIT QUAD 0.5 ML IM SUSP
0.5000 mL | Freq: Once | INTRAMUSCULAR | Status: AC
  Administered 2014-04-10: 0.5 mL via INTRAMUSCULAR

## 2014-04-10 MED ORDER — AMOXICILLIN-POT CLAVULANATE 875-125 MG PO TABS: 1.0000 | ORAL_TABLET | Freq: Two times a day (BID) | ORAL | Status: DC

## 2014-04-10 NOTE — Progress Notes (Signed)
[name], [name]  [id number] Phone: [phone] Fax:  [phone]  Date:  04/10/2014   ID:  [name], DOB 1933/03/26, MRN [id number]  PCP:  Marcial Pacas, DO  Cardiologist:  Fransico Him, MD     History of Present Illness: [name] is a 80 y.o. female with a history of HTN and asymptomatic bradycardia who presents today for followup. She has a history of bradycardia and her beta blocker had to be stopped. She is doing well today. She denies any chest pain, SOB, DOE, LE edema, dizziness, palpitations or syncope.    Wt Readings from Last 3 Encounters:  04/10/14 152 lb 12.8 oz (69.31 kg)  02/19/14 141 lb (63.957 kg)  09/10/13 166 lb 1.9 oz (75.352 kg)     Past Medical History  Diagnosis Date  . Asthma   . Depression   . GERD (gastroesophageal reflux disease)   . Arthritis     osteoarthritis  . Anxiety   . Osteopenia   . Candida esophagitis   . Diverticulosis   . Tubular adenoma of colon   . Osteoarthritis   . DJD (degenerative joint disease)      L5 compression fracture  . Gout   . Renal failure, unspecified   . Fibromyalgia   . C. difficile diarrhea   . Chronic sinusitis   . Panic disorder   . Spinal stenosis of lumbar region   . Hyperlipidemia   . Hypertension   . Edema extremities   . Depression     Current Outpatient Prescriptions  Medication Sig Dispense Refill  . ALPRAZolam (XANAX) 0.5 MG tablet Take 0.5 mg by mouth 4 (four) times daily as needed. For anxiety      . amLODipine (NORVASC) 10 MG tablet Take 1 tablet (10 mg total) by mouth daily.  90 tablet  3  . Calcium Carbonate-Vitamin D (CALCIUM 600+D) 600-400 MG-UNIT per tablet Take 1 tablet by mouth 2 (two) times daily.      . diphenhydrAMINE (BENADRYL) 25 MG tablet Take 25 mg by mouth every 6 (six) hours as needed. For allergies      . EPINEPHrine (EPI-PEN) 0.3 mg/0.3 mL DEVI Inject 0.3 mg into the muscle as needed.      . fentaNYL (DURAGESIC - DOSED MCG/HR) 25  MCG/HR Place 1 patch onto the skin every 3 (three) days.      . folic acid (FOLVITE) 1 MG tablet Take 2 mg by mouth daily.      [name] HYDROcodone-acetaminophen (NORCO/VICODIN) 5-325 MG per tablet Take 1 tablet by mouth every 6 (six) hours as needed for pain.      . Methotrexate Sodium, PF, 50 MG/2ML SOLN once a week.      [name] omeprazole (PRILOSEC) 20 MG capsule Take 20 mg by mouth daily.       . predniSONE (DELTASONE) 5 MG tablet Take 5 mg by mouth every other day.       . simvastatin (ZOCOR) 20 MG tablet Take 20 mg by mouth every morning.       . venlafaxine XR (EFFEXOR XR) 75 MG 24 hr capsule Take 75 mg by mouth daily with breakfast.      . zoledronic acid (RECLAST) 5 MG/100ML SOLN Inject 5 mg into the vein. Once yearly. In april       No current facility-administered medications for this visit.    Allergies:    Allergies  Allergen Reactions  . Bee Venom Anaphylaxis  Has epi-pen  . Ceftin [Cefuroxime Axetil] Anaphylaxis  . Ciprofloxacin Anaphylaxis and Palpitations  . Sulfonamide Derivatives Anaphylaxis  . Telithromycin Anaphylaxis    Reaction: also blurred vision   . Valsartan Anaphylaxis  . Verapamil Anaphylaxis  . Cephalosporins     Allergic Reaction  . Cymbalta [Duloxetine Hcl] Other (See Comments)    Reaction:Confusion and " I fall down"  . Gabapentin Other (See Comments)    Reaction: headache  . Lamotrigine     Patient unsure at this time  . Mirtazapine Other (See Comments)    unknown  . Morphine Other (See Comments)    Medication has no effect with pain  . Oxycodone-Acetaminophen Hives  . Pregabalin Swelling  . Prochlorperazine Edisylate   . Pseudoephedrine Other (See Comments)    Reaction: hyperventilates and blacks out  . Relafen [Nabumetone] Swelling  . Doxycycline Hives    Social History:  The patient  reports that she has never smoked. She has never used smokeless tobacco. She reports that she does not drink alcohol or use illicit drugs.   Family History:   The patient's family history includes Breast cancer in an other family member; Cancer in her father; Colon polyps in an other family member; Diabetes in her mother; Hypertension in her mother.   ROS:  Please see the history of present illness.      All other systems reviewed and negative.   PHYSICAL EXAM: VS:  BP 130/74  Pulse 83  Ht 5' (1.524 m)  Wt 152 lb 12.8 oz (69.31 kg)  BMI 29.84 kg/m2 Well nourished, well developed, in no acute distress HEENT: normal Neck: no JVD Cardiac:  normal S1, S2; RRR; no murmur Lungs:  clear to auscultation bilaterally, no wheezing, rhonchi or rales Abd: soft, nontender, no hepatomegaly Ext: no edema Skin: warm and dry Neuro:  CNs 2-12 intact, no focal abnormalities noted  ASSESSMENT AND PLAN:  1. Bradycardia - resolved off beta blockers 2. HTN - well controlled - continue amlodipine   She has moved to [place] so she would like to followup with her PCP for there HTN which is well controlled.  I told her I would be happy to see her if she develops and cardiac problems.      Signed, Fransico Him, MD 04/10/2014 11:23 AM

## 2014-04-10 NOTE — ED Provider Notes (Signed)
CSN: [id number]     Arrival date & time 04/10/14  1704 History   First MD Initiated Contact with Patient 04/10/14 1716     Chief Complaint  Patient presents with  . Wound Infection   (Consider location/radiation/quality/duration/timing/severity/associated sxs/prior Treatment) HPI She was clawed by her dog about 10 days ago.  She is on MTX and is concerned that b/c of her immunocompromised state that she has an infection.  Redness is getting worse since earlier this week.  No F/C/N/V. No drainage.  Minimal pain.  She is UTD on Td status.  Her dog is UTD on shots.  Past Medical History  Diagnosis Date  . Asthma   . Depression   . GERD (gastroesophageal reflux disease)   . Arthritis     osteoarthritis  . Anxiety   . Osteopenia   . Candida esophagitis   . Diverticulosis   . Tubular adenoma of colon   . Osteoarthritis   . DJD (degenerative joint disease)      L5 compression fracture  . Gout   . Renal failure, unspecified   . Fibromyalgia   . C. difficile diarrhea   . Chronic sinusitis   . Panic disorder   . Spinal stenosis of lumbar region   . Hyperlipidemia   . Hypertension   . Edema extremities   . Depression    Past Surgical History  Procedure Laterality Date  . Total hip arthroplasty  2004    right  . Total knee arthroplasty  2006    right  . Carpal tunnel release  2007, 2009    Bilateral  . Shoulder surgery  2004, 07/30/10, 01/2011    after her right humeral neck fracture, revision hemiarthroplasty 2004 for persistent pain, revision reverse arthroplasty December 2011 for persistent pain, incision and drainage with poly-exchange 01/26/2011 for possible prosthetic joint infection.  . Total abdominal hysterectomy    . Ganglion cyst  wrist    . Ganglion cyct removal on fingers      right  . Tonsillectomy and adenoidectomy    . Nasal sinus surgery    . Total shoulder replacement  2002    right  . Knee arthroscopy      right  . Cataract extraction, bilateral      Family History  Problem Relation Age of Onset  . Breast cancer    . Colon polyps    . Cancer Father     bladder  . Diabetes Mother   . Hypertension Mother    History  Substance Use Topics  . Smoking status: Never Smoker   . Smokeless tobacco: Never Used  . Alcohol Use: No   OB History   Grav Para Term Preterm Abortions TAB SAB Ect Mult Living                 Review of Systems  All other systems reviewed and are negative.   Allergies  Bee venom; Ceftin; Ciprofloxacin; Sulfonamide derivatives; Telithromycin; Valsartan; Verapamil; Cephalosporins; Cymbalta; Gabapentin; Lamotrigine; Mirtazapine; Morphine; Oxycodone-acetaminophen; Pregabalin; Prochlorperazine edisylate; Pseudoephedrine; Relafen; and Doxycycline  Home Medications   Prior to Admission medications   Medication Sig Start Date End Date Taking? Authorizing Provider  ALPRAZolam Duanne Moron) 0.5 MG tablet Take 0.5 mg by mouth 4 (four) times daily as needed. For anxiety    Historical Provider, MD  amLODipine (NORVASC) 10 MG tablet Take 1 tablet (10 mg total) by mouth daily. 09/10/13   Sueanne Margarita, MD  amoxicillin-clavulanate (AUGMENTIN) 875-125 MG per tablet Take 1  tablet by mouth 2 (two) times daily. 04/10/14   Janeann Forehand, MD  Calcium Carbonate-Vitamin D (CALCIUM 600+D) 600-400 MG-UNIT per tablet Take 1 tablet by mouth 2 (two) times daily.    Historical Provider, MD  diphenhydrAMINE (BENADRYL) 25 MG tablet Take 25 mg by mouth every 6 (six) hours as needed. For allergies    Historical Provider, MD  EPINEPHrine (EPI-PEN) 0.3 mg/0.3 mL DEVI Inject 0.3 mg into the muscle as needed.    Historical Provider, MD  fentaNYL (DURAGESIC - DOSED MCG/HR) 25 MCG/HR Place 1 patch onto the skin every 3 (three) days.    Historical Provider, MD  folic acid (FOLVITE) 1 MG tablet Take 2 mg by mouth daily.    Historical Provider, MD  HYDROcodone-acetaminophen (NORCO/VICODIN) 5-325 MG per tablet Take 1 tablet by mouth every 6 (six) hours  as needed for pain.    Historical Provider, MD  Methotrexate Sodium, PF, 50 MG/2ML SOLN once a week. 04/14/13   Historical Provider, MD  omeprazole (PRILOSEC) 20 MG capsule Take 20 mg by mouth daily.  02/08/11   Historical Provider, MD  predniSONE (DELTASONE) 5 MG tablet Take 5 mg by mouth every other day.     Historical Provider, MD  simvastatin (ZOCOR) 20 MG tablet Take 20 mg by mouth every morning.  02/09/11   Historical Provider, MD  venlafaxine XR (EFFEXOR XR) 75 MG 24 hr capsule Take 75 mg by mouth daily with breakfast.    Historical Provider, MD  zoledronic acid (RECLAST) 5 MG/100ML SOLN Inject 5 mg into the vein. Once yearly. In april    Historical Provider, MD   BP 171/91  Pulse 91  Temp(Src) 98.5 F (36.9 C) (Oral)  Resp 18  Ht 5' (1.524 m)  Wt 154 lb (69.854 kg)  BMI 30.08 kg/m2  SpO2 98% Physical Exam  Nursing note and vitals reviewed. Constitutional: She is oriented to person, place, and time. She appears well-developed and well-nourished.  HENT:  Head: Normocephalic and atraumatic.  Eyes: No scleral icterus.  Neck: Neck supple.  Cardiovascular: Regular rhythm and normal heart sounds.   Pulmonary/Chest: Effort normal and breath sounds normal. No respiratory distress.  Musculoskeletal:       Hands: Are of dorsum of R hand c/w sloughed skin (1x1cm) and very mild surrounding erythema, no drainage, minimal tenderness.  Distal cap refill and sensation intact.  Neurological: She is alert and oriented to person, place, and time.  Skin: Skin is warm and dry.  Psychiatric: She has a normal mood and affect. Her speech is normal.    ED Course  Procedures (including critical care time) Labs Review Labs Reviewed - No data to display  Imaging Review No results found.   MDM   1. Cellulitis of hand    Minimal cellulitis of R hand. Due to her h/o being on MTX, will start on Augmentin.  She has taken Amoxicillin in the past with no problems.  She has many antibiotic allergies  and these were discussed at length to make sure that we gave her something that she could take.  Keep clean and dry.  Follow up with your PCP in a week.  If not improving, follow up sooner.  If problems with the antibiotics, go to ER or call office.  Also gave her a flu shot today.  SE warnings given.  Janeann Forehand, MD 04/10/14 1745

## 2014-04-10 NOTE — Patient Instructions (Signed)
Your physician recommends that you continue on your current medications as directed. Please refer to the Current Medication list given to you today.  Your physician recommends that you schedule a follow-up appointment As Needed

## 2014-04-10 NOTE — ED Notes (Signed)
Dog scratched right posterior hand x 1 week ago.  Superficial scratch with slight redness noted. Site is clean and dry.  No drainage or swelling seen.  Patient has been self treating cleaning site BID and applying Neosporin then covering with a band aid.  Patient is concerned about possible infection to the wound.

## 2014-04-16 ENCOUNTER — Telehealth: Payer: Self-pay | Admitting: Family Medicine

## 2014-04-16 DIAGNOSIS — M1710 Unilateral primary osteoarthritis, unspecified knee: Secondary | ICD-10-CM | POA: Diagnosis not present

## 2014-04-16 DIAGNOSIS — F419 Anxiety disorder, unspecified: Principal | ICD-10-CM

## 2014-04-16 DIAGNOSIS — M069 Rheumatoid arthritis, unspecified: Secondary | ICD-10-CM | POA: Diagnosis not present

## 2014-04-16 DIAGNOSIS — G894 Chronic pain syndrome: Secondary | ICD-10-CM | POA: Diagnosis not present

## 2014-04-16 DIAGNOSIS — F329 Major depressive disorder, single episode, unspecified: Secondary | ICD-10-CM

## 2014-04-16 DIAGNOSIS — M2550 Pain in unspecified joint: Secondary | ICD-10-CM | POA: Diagnosis not present

## 2014-04-16 NOTE — Telephone Encounter (Signed)
Referral request

## 2014-04-18 ENCOUNTER — Ambulatory Visit (INDEPENDENT_AMBULATORY_CARE_PROVIDER_SITE_OTHER): Payer: Medicare Other | Admitting: Psychiatry

## 2014-04-18 ENCOUNTER — Encounter (HOSPITAL_COMMUNITY): Payer: Self-pay | Admitting: Psychiatry

## 2014-04-18 DIAGNOSIS — F410 Panic disorder [episodic paroxysmal anxiety] without agoraphobia: Secondary | ICD-10-CM

## 2014-04-18 DIAGNOSIS — F0630 Mood disorder due to known physiological condition, unspecified: Secondary | ICD-10-CM | POA: Diagnosis not present

## 2014-04-18 DIAGNOSIS — F331 Major depressive disorder, recurrent, moderate: Secondary | ICD-10-CM | POA: Diagnosis not present

## 2014-04-18 NOTE — Patient Instructions (Signed)
Patient taking xanax twice a day. encoruaged not to increase the dose and be cautious of all meds can cause dizziness.
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
## 2014-04-21 DIAGNOSIS — M67919 Unspecified disorder of synovium and tendon, unspecified shoulder: Secondary | ICD-10-CM | POA: Diagnosis not present

## 2014-04-21 DIAGNOSIS — M069 Rheumatoid arthritis, unspecified: Secondary | ICD-10-CM | POA: Diagnosis not present

## 2014-04-21 DIAGNOSIS — Z79899 Other long term (current) drug therapy: Secondary | ICD-10-CM | POA: Diagnosis not present

## 2014-04-21 DIAGNOSIS — L03119 Cellulitis of unspecified part of limb: Secondary | ICD-10-CM | POA: Diagnosis not present

## 2014-04-21 DIAGNOSIS — L02519 Cutaneous abscess of unspecified hand: Secondary | ICD-10-CM | POA: Diagnosis not present

## 2014-04-30 ENCOUNTER — Encounter: Payer: Self-pay | Admitting: Family Medicine

## 2014-04-30 ENCOUNTER — Ambulatory Visit (INDEPENDENT_AMBULATORY_CARE_PROVIDER_SITE_OTHER): Payer: Medicare Other | Admitting: Family Medicine

## 2014-04-30 VITALS — BP 147/79 | HR 92 | Temp 98.0°F | Wt 149.0 lb

## 2014-04-30 DIAGNOSIS — R300 Dysuria: Secondary | ICD-10-CM | POA: Diagnosis not present

## 2014-04-30 DIAGNOSIS — M159 Polyosteoarthritis, unspecified: Secondary | ICD-10-CM

## 2014-04-30 DIAGNOSIS — M069 Rheumatoid arthritis, unspecified: Secondary | ICD-10-CM

## 2014-04-30 DIAGNOSIS — M153 Secondary multiple arthritis: Secondary | ICD-10-CM

## 2014-04-30 DIAGNOSIS — M25519 Pain in unspecified shoulder: Secondary | ICD-10-CM

## 2014-04-30 DIAGNOSIS — M25512 Pain in left shoulder: Secondary | ICD-10-CM

## 2014-04-30 LAB — POCT URINALYSIS DIPSTICK
Bilirubin, UA: NEGATIVE
Blood, UA: NEGATIVE
GLUCOSE UA: NEGATIVE
Nitrite, UA: NEGATIVE
Protein, UA: NEGATIVE
SPEC GRAV UA: 1.015
UROBILINOGEN UA: 0.2
pH, UA: 6

## 2014-04-30 MED ORDER — NITROFURANTOIN MONOHYD MACRO 100 MG PO CAPS: 100.0000 mg | ORAL_CAPSULE | Freq: Two times a day (BID) | ORAL | Status: AC

## 2014-04-30 NOTE — Progress Notes (Signed)
CC: [name] is a 80 y.o. female is here for left arm pain and Dysuria   Subjective: HPI:   Complains of urinary urgency, frequency, and dysuria localized at a urethra that has been present for the past 5 days ever since restarting methotrexate. Symptoms are moderate in severity persistent, and present all the day often interfering with sleep. Nothing particularly seems to make better or worse and is been no interventions as of yet. She tells me this feels like past urinary tract infections. She denies any other genitourinary complaints, fevers, chills, nor flank pain. Denies nausea or vomiting.  She requests guidance on further management of left shoulder pain has been present for the past 2-3 months. She's been evaluated by her rheumatologist and do to a suspicion of tendinitis/bursitis she received a cortisone injection a few weeks ago that sounds like it was a subacromial injection. She had moderate relief of her pain for 2-3 days however slowly returned. Pain is not improved with hydrocodone that she has at home or fentanyl patches provided by her pain management clinic. Her rheumatologist like to get an MRI for further evaluation of the left shoulder however the patient has strong reservations and requests my opinion on whether or not she needs to get an MRI. The pain is described only a sprain, localized to the lateral aspect of the shoulder and nonradiating. Nothing particularly makes it better or worse. She is more fearful of getting an MRI and she is dealing with the pain and she is positive that she would not get surgery even if surgery was warranted for pain control. She is not interested in examination of the left shoulder.  She is requesting a referral to pain management clinic because transportation to the clinic in [place] is becoming cumbersome for her and her husband.   Review Of Systems Outlined In HPI  Past Medical History  Diagnosis Date  . Asthma   . Depression   .  GERD (gastroesophageal reflux disease)   . Arthritis     osteoarthritis  . Anxiety   . Osteopenia   . Candida esophagitis   . Diverticulosis   . Tubular adenoma of colon   . Osteoarthritis   . DJD (degenerative joint disease)      L5 compression fracture  . Gout   . Renal failure, unspecified   . Fibromyalgia   . C. difficile diarrhea   . Chronic sinusitis   . Panic disorder   . Spinal stenosis of lumbar region   . Hyperlipidemia   . Hypertension   . Edema extremities   . Depression     Past Surgical History  Procedure Laterality Date  . Total hip arthroplasty  2004    right  . Total knee arthroplasty  2006    right  . Carpal tunnel release  2007, 2009    Bilateral  . Shoulder surgery  2004, 07/30/10, 01/2011    after her right humeral neck fracture, revision hemiarthroplasty 2004 for persistent pain, revision reverse arthroplasty December 2011 for persistent pain, incision and drainage with poly-exchange 01/26/2011 for possible prosthetic joint infection.  . Total abdominal hysterectomy    . Ganglion cyst  wrist    . Ganglion cyct removal on fingers      right  . Tonsillectomy and adenoidectomy    . Nasal sinus surgery    . Total shoulder replacement  2002    right  . Knee arthroscopy      right  . Cataract extraction,  bilateral     Family History  Problem Relation Age of Onset  . Breast cancer    . Colon polyps    . Cancer Father     bladder  . Diabetes Mother   . Hypertension Mother   . Depression Son     History   Social History  . Marital Status: Married    Spouse Name: N/A    Number of Children: N/A  . Years of Education: N/A   Occupational History  . Not on file.   Social History Main Topics  . Smoking status: Never Smoker   . Smokeless tobacco: Never Used  . Alcohol Use: No  . Drug Use: No  . Sexual Activity: Not on file   Other Topics Concern  . Not on file   Social History Narrative  . No narrative on file     Objective: BP  147/79  Pulse 92  Temp(Src) 98 F (36.7 C) (Oral)  Wt 149 lb (67.586 kg)  General: Alert and Oriented, No Acute Distress HEENT: Pupils equal, round, reactive to light. Conjunctivae clear.  Moist mucous membranes pharynx unremarkable Lungs: Clear to auscultation bilaterally, no wheezing/ronchi/rales.  Comfortable work of breathing. Good air movement. Cardiac: Regular rate and rhythm. Normal S1/S2.  No murmurs, rubs, nor gallops.   Back: No CVA tenderness/pain Extremities: No peripheral edema.  Strong peripheral pulses.  Mental Status: No depression, anxiety, nor agitation. Skin: Warm and dry.  Assessment & Plan: [name] was seen today for left arm pain and dysuria.  Diagnoses and associated orders for this visit:  Dysuria - Urinalysis Dipstick - Urine Culture - nitrofurantoin, macrocrystal-monohydrate, (MACROBID) 100 MG capsule; Take 1 capsule (100 mg total) by mouth 2 (two) times daily.  Secondary osteoarthritis of multiple sites - Ambulatory referral to Pain Clinic  Chronic rheumatic arthritis - Ambulatory referral to Pain Clinic  Left shoulder pain    Dysuria: Symptoms suggestive of UTI, leukocytes on urinalysis we'll treat with Macrobid given vast antibiotic intolerance/allergies. We'll follow culture. Rheumatoid arthritis and osteoarthritis: Referral to comprehensive pain specialists here in [place]. Left shoulder pain: We'll lengthy discussion that if she is confident that she would not want surgery for any source of her pain in her left shoulder that in my opinion in the MRI is not 100% necessary. Discussed my treatment plan would be that an x-ray would still be beneficial to rule out an orthopedic source of her pain and then my next step would be referral to physical therapy. She still thinks that she does not want to get an MRI and we'll discuss the logic we discussed today with her rheumatologist.  40 minutes spent face-to-face during visit today of which at least  50% was counseling or coordinating care regarding: 1. Dysuria   2. Secondary osteoarthritis of multiple sites   3. Chronic rheumatic arthritis   4. Left shoulder pain      Return if symptoms worsen or fail to improve.

## 2014-05-01 DIAGNOSIS — M85822 Other specified disorders of bone density and structure, left upper arm: Secondary | ICD-10-CM | POA: Diagnosis not present

## 2014-05-01 DIAGNOSIS — M19012 Primary osteoarthritis, left shoulder: Secondary | ICD-10-CM | POA: Diagnosis not present

## 2014-05-04 LAB — URINE CULTURE

## 2014-05-06 DIAGNOSIS — F331 Major depressive disorder, recurrent, moderate: Secondary | ICD-10-CM | POA: Diagnosis not present

## 2014-05-13 DIAGNOSIS — G894 Chronic pain syndrome: Secondary | ICD-10-CM | POA: Diagnosis not present

## 2014-05-13 DIAGNOSIS — M179 Osteoarthritis of knee, unspecified: Secondary | ICD-10-CM | POA: Diagnosis not present

## 2014-05-13 DIAGNOSIS — M069 Rheumatoid arthritis, unspecified: Secondary | ICD-10-CM | POA: Diagnosis not present

## 2014-05-13 DIAGNOSIS — M2550 Pain in unspecified joint: Secondary | ICD-10-CM | POA: Diagnosis not present

## 2014-05-16 DIAGNOSIS — H02824 Cysts of left upper eyelid: Secondary | ICD-10-CM | POA: Diagnosis not present

## 2014-05-23 ENCOUNTER — Encounter: Payer: Self-pay | Admitting: Family Medicine

## 2014-05-23 ENCOUNTER — Ambulatory Visit (INDEPENDENT_AMBULATORY_CARE_PROVIDER_SITE_OTHER): Payer: Medicare Other | Admitting: Family Medicine

## 2014-05-23 VITALS — BP 146/72 | HR 74 | Temp 98.3°F | Wt 146.0 lb

## 2014-05-23 DIAGNOSIS — F32A Depression, unspecified: Secondary | ICD-10-CM

## 2014-05-23 DIAGNOSIS — I10 Essential (primary) hypertension: Secondary | ICD-10-CM

## 2014-05-23 DIAGNOSIS — E785 Hyperlipidemia, unspecified: Secondary | ICD-10-CM | POA: Diagnosis not present

## 2014-05-23 DIAGNOSIS — F329 Major depressive disorder, single episode, unspecified: Secondary | ICD-10-CM | POA: Diagnosis not present

## 2014-05-23 DIAGNOSIS — M810 Age-related osteoporosis without current pathological fracture: Secondary | ICD-10-CM

## 2014-05-23 DIAGNOSIS — K5900 Constipation, unspecified: Secondary | ICD-10-CM | POA: Insufficient documentation

## 2014-05-23 NOTE — Progress Notes (Signed)
CC: [name] is a 80 y.o. female is here for depression f/u   Subjective: HPI:  Followup depression: Patient states that she believes her depression has worsened over the past few weeks. She reports a major influence being the number medication that she has to take on a daily basis. She describes decreased interest in all hobbies and a negative outlook on life but denies thoughts wanting to harm herself or others. Denies any other mental disturbance and denies anxiety.  Followup osteoporosis: She's due for a repeat DEXA scan. Currently she is not taking any medication for osteoporosis other than vitamin D and calcium. She denies any new bone or joint pain other than her chronic rheumatoid arthritis joint pain.  Followup essential hypertension: Continues on amlodipine with no outside blood pressures to report denies chest pain shortness of breath orthopnea nor peripheral edema  Review Of Systems Outlined In HPI  Past Medical History  Diagnosis Date  . Asthma   . Depression   . GERD (gastroesophageal reflux disease)   . Arthritis     osteoarthritis  . Anxiety   . Osteopenia   . Candida esophagitis   . Diverticulosis   . Tubular adenoma of colon   . Osteoarthritis   . DJD (degenerative joint disease)      L5 compression fracture  . Gout   . Renal failure, unspecified   . Fibromyalgia   . C. difficile diarrhea   . Chronic sinusitis   . Panic disorder   . Spinal stenosis of lumbar region   . Hyperlipidemia   . Hypertension   . Edema extremities   . Depression     Past Surgical History  Procedure Laterality Date  . Total hip arthroplasty  2004    right  . Total knee arthroplasty  2006    right  . Carpal tunnel release  2007, 2009    Bilateral  . Shoulder surgery  2004, 07/30/10, 01/2011    after her right humeral neck fracture, revision hemiarthroplasty 2004 for persistent pain, revision reverse arthroplasty December 2011 for persistent pain, incision and drainage  with poly-exchange 01/26/2011 for possible prosthetic joint infection.  . Total abdominal hysterectomy    . Ganglion cyst  wrist    . Ganglion cyct removal on fingers      right  . Tonsillectomy and adenoidectomy    . Nasal sinus surgery    . Total shoulder replacement  2002    right  . Knee arthroscopy      right  . Cataract extraction, bilateral     Family History  Problem Relation Age of Onset  . Breast cancer    . Colon polyps    . Cancer Father     bladder  . Diabetes Mother   . Hypertension Mother   . Depression Son     History   Social History  . Marital Status: Married    Spouse Name: N/A    Number of Children: N/A  . Years of Education: N/A   Occupational History  . Not on file.   Social History Main Topics  . Smoking status: Never Smoker   . Smokeless tobacco: Never Used  . Alcohol Use: No  . Drug Use: No  . Sexual Activity: Not on file   Other Topics Concern  . Not on file   Social History Narrative  . No narrative on file     Objective: BP 146/72  Pulse 74  Temp(Src) 98.3 F (36.8 C) (Oral)  Wt 146 lb (66.225 kg)  Vital signs reviewed. General: Alert and Oriented, No Acute Distress HEENT: Pupils equal, round, reactive to light. Conjunctivae clear.  External ears unremarkable.  Moist mucous membranes. Lungs: Clear and comfortable work of breathing, speaking in full sentences without accessory muscle use. Cardiac: Regular rate and rhythm.  Neuro: CN II-XII grossly intact, gait normal. Extremities: No peripheral edema.  Strong peripheral pulses.  Mental Status: Mild depression. No anxiety, nor agitation. Logical though process. Skin: Warm and dry.  Assessment & Plan: [name] was seen today for depression f/u.  Diagnoses and associated orders for this visit:  Essential hypertension  Depression  Osteoporosis - DG Bone Density; Future  Dyslipidemia    Essential hypertension: Controlled continue amlodipine Depression: Uncontrolled  given that the amount of medications is influencing her depression will begin attempting to reduce her medication load as listed below. Encouraged her to follow up with her psychiatrist for further management of worsening depression. Osteoporosis: DEXA scan needed, this is been ordered today. Dyslipidemia: She and I know of no known atherosclerotic disease, she is not a diabetic, and has never had a stroke or heart attack. She actually might not need to be on a statin based on recent American heart Association guidelines. She's going to stop taking this for the next 3 months and we will repeat a lipid panel at that time to calculate her 10 year risk of having a cardiovascular event  25 minutes spent face-to-face during visit today of which at least 50% was counseling or coordinating care regarding: 1. Essential hypertension   2. Depression   3. Osteoporosis   4. Dyslipidemia      Return in about 3 months (around 08/23/2014).

## 2014-05-28 ENCOUNTER — Ambulatory Visit (INDEPENDENT_AMBULATORY_CARE_PROVIDER_SITE_OTHER): Payer: Medicare Other

## 2014-05-28 DIAGNOSIS — M810 Age-related osteoporosis without current pathological fracture: Secondary | ICD-10-CM | POA: Diagnosis not present

## 2014-05-30 ENCOUNTER — Telehealth: Payer: Self-pay | Admitting: Family Medicine

## 2014-05-30 DIAGNOSIS — M810 Age-related osteoporosis without current pathological fracture: Secondary | ICD-10-CM

## 2014-05-30 NOTE — Telephone Encounter (Signed)
Seth Bake, Will you please let patient know that her T score was -2.5 which is still in the osteoporosis range.  I would recommend that she restart a medication to reduce bone density breakdown.  If she's interested I can try to arrange restarting her annual zoledronic acid infusions through her rheumatologist. Continue calcium 1200mg  and Vitamin D 800 Units daily.

## 2014-06-02 ENCOUNTER — Telehealth (HOSPITAL_COMMUNITY): Payer: Self-pay

## 2014-06-02 DIAGNOSIS — Z79899 Other long term (current) drug therapy: Secondary | ICD-10-CM | POA: Diagnosis not present

## 2014-06-02 DIAGNOSIS — M130 Polyarthritis, unspecified: Secondary | ICD-10-CM | POA: Diagnosis not present

## 2014-06-02 DIAGNOSIS — M797 Fibromyalgia: Secondary | ICD-10-CM | POA: Diagnosis not present

## 2014-06-02 DIAGNOSIS — M069 Rheumatoid arthritis, unspecified: Secondary | ICD-10-CM | POA: Diagnosis not present

## 2014-06-02 NOTE — Telephone Encounter (Signed)
PT is having trouble sleeping. She would like to stop the xanax and get something that will help her sleep.

## 2014-06-03 ENCOUNTER — Telehealth: Payer: Self-pay | Admitting: *Deleted

## 2014-06-03 ENCOUNTER — Emergency Department (INDEPENDENT_AMBULATORY_CARE_PROVIDER_SITE_OTHER)
Admission: EM | Admit: 2014-06-03 | Discharge: 2014-06-03 | Disposition: A | Discharge status: Home / Self Care | Payer: Medicare Other | Source: Home / Self Care | Attending: Emergency Medicine | Admitting: Emergency Medicine

## 2014-06-03 ENCOUNTER — Encounter: Payer: Self-pay | Admitting: *Deleted

## 2014-06-03 DIAGNOSIS — J0100 Acute maxillary sinusitis, unspecified: Secondary | ICD-10-CM

## 2014-06-03 MED ORDER — TRAZODONE HCL 50 MG PO TABS: ORAL_TABLET | ORAL | Status: DC

## 2014-06-03 MED ORDER — FLUTICASONE PROPIONATE 50 MCG/ACT NA SUSP: NASAL | Status: DC

## 2014-06-03 MED ORDER — CLINDAMYCIN HCL 150 MG PO CAPS: 150.0000 mg | ORAL_CAPSULE | Freq: Three times a day (TID) | ORAL | Status: DC

## 2014-06-03 NOTE — ED Notes (Signed)
Pt c/o nasal congestion, cough and SOB intermittently x 1 wk. She also c/o feeling shaky x 1wk after not sleeping well.

## 2014-06-03 NOTE — ED Provider Notes (Signed)
CSN: [id number]     Arrival date & time 06/03/14  1348 History   First MD Initiated Contact with Patient 06/03/14 1356     Chief Complaint  Patient presents with  . Nasal Congestion  . Cough  . Shortness of Breath   (Consider location/radiation/quality/duration/timing/severity/associated sxs/prior Treatment) HPI Complains of nasal congestion thick discolored rhinorrhea, facial pain over sinuses and cough occasionally productive sputum. Symptoms times one week, gradually worsening. Low grade fever but no chills. A vague feeling of shortness of breath but she feels that's only when she's coughing. Past Medical History  Diagnosis Date  . Asthma   . Depression   . GERD (gastroesophageal reflux disease)   . Arthritis     osteoarthritis  . Anxiety   . Osteopenia   . Candida esophagitis   . Diverticulosis   . Tubular adenoma of colon   . Osteoarthritis   . DJD (degenerative joint disease)      L5 compression fracture  . Gout   . Renal failure, unspecified   . Fibromyalgia   . C. difficile diarrhea   . Chronic sinusitis   . Panic disorder   . Spinal stenosis of lumbar region   . Hyperlipidemia   . Hypertension   . Edema extremities   . Depression    Past Surgical History  Procedure Laterality Date  . Total hip arthroplasty  2004    right  . Total knee arthroplasty  2006    right  . Carpal tunnel release  2007, 2009    Bilateral  . Shoulder surgery  2004, 07/30/10, 01/2011    after her right humeral neck fracture, revision hemiarthroplasty 2004 for persistent pain, revision reverse arthroplasty December 2011 for persistent pain, incision and drainage with poly-exchange 01/26/2011 for possible prosthetic joint infection.  . Total abdominal hysterectomy    . Ganglion cyst  wrist    . Ganglion cyct removal on fingers      right  . Tonsillectomy and adenoidectomy    . Nasal sinus surgery    . Total shoulder replacement  2002    right  . Knee arthroscopy      right  .  Cataract extraction, bilateral    . Choanal adeniodectomy    . Adenoidectomy     Family History  Problem Relation Age of Onset  . Breast cancer    . Colon polyps    . Cancer Father     bladder  . Diabetes Mother   . Hypertension Mother   . Depression Son    History  Substance Use Topics  . Smoking status: Never Smoker   . Smokeless tobacco: Never Used  . Alcohol Use: No   OB History    No data available     Review of Systems  All other systems reviewed and are negative.   Allergies  Bee venom; Ceftin; Ciprofloxacin; Sulfonamide derivatives; Telithromycin; Valsartan; Verapamil; Cephalosporins; Cymbalta; Gabapentin; Lamotrigine; Mirtazapine; Morphine; Oxycodone-acetaminophen; Pregabalin; Prochlorperazine edisylate; Pseudoephedrine; Relafen; and Doxycycline  Home Medications   Prior to Admission medications   Medication Sig Start Date End Date Taking? Authorizing Provider  ALPRAZolam Duanne Moron) 0.5 MG tablet Take 0.5 mg by mouth 4 (four) times daily as needed. For anxiety    Historical Provider, MD  amLODipine (NORVASC) 10 MG tablet Take 1 tablet (10 mg total) by mouth daily. 09/10/13   Sueanne Margarita, MD  Calcium Carbonate-Vitamin D (CALCIUM 600+D) 600-400 MG-UNIT per tablet Take 1 tablet by mouth 2 (two) times daily.  Historical Provider, MD  clindamycin (CLEOCIN) 150 MG capsule Take 1 capsule (150 mg total) by mouth 3 (three) times daily. For 7 days 06/03/14   Jacqulyn Cane, MD  diphenhydrAMINE (BENADRYL) 25 MG tablet Take 25 mg by mouth every 6 (six) hours as needed. For allergies    Historical Provider, MD  EPINEPHrine (EPI-PEN) 0.3 mg/0.3 mL DEVI Inject 0.3 mg into the muscle as needed.    Historical Provider, MD  escitalopram (LEXAPRO) 20 MG tablet Take 20 mg by mouth.    Historical Provider, MD  fentaNYL (DURAGESIC - DOSED MCG/HR) 25 MCG/HR Place 1 patch onto the skin every 3 (three) days.    Historical Provider, MD  fluticasone Asencion Islam) 50 MCG/ACT nasal spray 1 or 2  sprays each nostril twice a day 06/03/14   Jacqulyn Cane, MD  folic acid (FOLVITE) 1 MG tablet Take 1 mg by mouth daily.     Historical Provider, MD  HYDROcodone-acetaminophen (NORCO/VICODIN) 5-325 MG per tablet Take 1 tablet by mouth every 6 (six) hours as needed for pain.    Historical Provider, MD  Methotrexate Sodium, PF, 50 MG/2ML SOLN once a week. 04/14/13   Historical Provider, MD  omeprazole (PRILOSEC) 20 MG capsule Take 20 mg by mouth daily.  02/08/11   Historical Provider, MD  predniSONE (DELTASONE) 5 MG tablet Take 5 mg by mouth every other day.     Historical Provider, MD  zoledronic acid (RECLAST) 5 MG/100ML SOLN Inject 5 mg into the vein. Once yearly. In april    Historical Provider, MD   BP 153/76 mmHg  Pulse 99  Temp(Src) 98 F (36.7 C) (Oral)  Resp 18  Ht 5\' 2"  (1.575 m)  Wt 151 lb (68.493 kg)  BMI 27.61 kg/m2  SpO2 97% Physical Exam  Constitutional: She is oriented to person, place, and time. She appears well-developed and well-nourished. No distress.  HENT:  Head: Normocephalic and atraumatic.  Right Ear: Tympanic membrane, external ear and ear canal normal.  Left Ear: Tympanic membrane, external ear and ear canal normal.  Nose: Mucosal edema and rhinorrhea present. Right sinus exhibits maxillary sinus tenderness. Left sinus exhibits maxillary sinus tenderness.  Mouth/Throat: Oropharynx is clear and moist. No oral lesions. No oropharyngeal exudate.  Eyes: Right eye exhibits no discharge. Left eye exhibits no discharge. No scleral icterus.  Neck: Neck supple.  Cardiovascular: Normal rate, regular rhythm and normal heart sounds.   Pulmonary/Chest: Effort normal and breath sounds normal. She has no wheezes. She has no rales.  Lymphadenopathy:    She has no cervical adenopathy.  Neurological: She is alert and oriented to person, place, and time.  Skin: Skin is warm and dry.  Nursing note and vitals reviewed.   ED Course  Procedures (including critical care time) Labs  Review Labs Reviewed - No data to display  Imaging Review No results found.   MDM   1. Acute maxillary sinusitis, recurrence not specified     Reviewed multiple antibiotic allergies and possible antibiotic choices. After risk, benefits, all terms discussed, following prescribed. New Prescriptions   CLINDAMYCIN (CLEOCIN) 150 MG CAPSULE    Take 1 capsule (150 mg total) by mouth 3 (three) times daily. For 7 days   FLUTICASONE (FLONASE) 50 MCG/ACT NASAL SPRAY    1 or 2 sprays each nostril twice a day    precautions discussed.red flags discussed Follow-up with PCP or ENT in one week if no better. She voiced understanding and agreement with above. Over 25 minutes spent, greater than 50%  of the time spent for counseling and coordination of care.   Jacqulyn Cane, MD 06/03/14 [phone]

## 2014-06-03 NOTE — Telephone Encounter (Signed)
Do not stop xanax abruptly. Skip the night dose  Of xanax for now. Will replace that with trazadone 50mg  (half tablet) to be taken at night.

## 2014-06-03 NOTE — Telephone Encounter (Deleted)
Called pt.

## 2014-06-03 NOTE — Telephone Encounter (Signed)
Called pt and informed pt by Dr. De Nurse, skip the night dose of Xanax and she will start taking Trazodone 50 mg at night. Informed pt, prescription has been called into pharmacy. Pt states and shows understanding.

## 2014-06-05 DIAGNOSIS — F341 Dysthymic disorder: Secondary | ICD-10-CM | POA: Diagnosis not present

## 2014-06-05 DIAGNOSIS — F331 Major depressive disorder, recurrent, moderate: Secondary | ICD-10-CM | POA: Diagnosis not present

## 2014-06-05 DIAGNOSIS — F410 Panic disorder [episodic paroxysmal anxiety] without agoraphobia: Secondary | ICD-10-CM | POA: Diagnosis not present

## 2014-06-09 NOTE — Telephone Encounter (Signed)
PT would like to speak to Dr. De Nurse. Pt states she can not sleep when she takes the Trazodone at night. She has taken half of Xanax with Trazodone to help her sleep at night. She would like to know what she can do to get off the Xanax without having the sleep issues?  Please call pt at [phone].

## 2014-06-09 NOTE — Telephone Encounter (Signed)
Spoke with pt, per Dr. De Nurse pt should continue to take Trazodone at night with half a tablet of Xanax . Pt has a follow up appt on 11/19 with Dr. De Nurse. Pt states and shows understanding.

## 2014-06-10 DIAGNOSIS — M069 Rheumatoid arthritis, unspecified: Secondary | ICD-10-CM | POA: Diagnosis not present

## 2014-06-10 DIAGNOSIS — M1990 Unspecified osteoarthritis, unspecified site: Secondary | ICD-10-CM | POA: Diagnosis not present

## 2014-06-10 DIAGNOSIS — Z79899 Other long term (current) drug therapy: Secondary | ICD-10-CM | POA: Diagnosis not present

## 2014-06-10 DIAGNOSIS — G8929 Other chronic pain: Secondary | ICD-10-CM | POA: Diagnosis not present

## 2014-06-10 DIAGNOSIS — M545 Low back pain: Secondary | ICD-10-CM | POA: Diagnosis not present

## 2014-06-11 ENCOUNTER — Telehealth: Payer: Self-pay | Admitting: Family Medicine

## 2014-06-11 ENCOUNTER — Ambulatory Visit (INDEPENDENT_AMBULATORY_CARE_PROVIDER_SITE_OTHER): Payer: Medicare Other | Admitting: Family Medicine

## 2014-06-11 ENCOUNTER — Encounter: Payer: Self-pay | Admitting: Family Medicine

## 2014-06-11 VITALS — BP 147/82 | HR 40 | Wt 154.0 lb

## 2014-06-11 DIAGNOSIS — G894 Chronic pain syndrome: Secondary | ICD-10-CM | POA: Diagnosis not present

## 2014-06-11 DIAGNOSIS — M2550 Pain in unspecified joint: Secondary | ICD-10-CM | POA: Diagnosis not present

## 2014-06-11 DIAGNOSIS — M069 Rheumatoid arthritis, unspecified: Secondary | ICD-10-CM | POA: Diagnosis not present

## 2014-06-11 DIAGNOSIS — M179 Osteoarthritis of knee, unspecified: Secondary | ICD-10-CM | POA: Diagnosis not present

## 2014-06-11 DIAGNOSIS — M810 Age-related osteoporosis without current pathological fracture: Secondary | ICD-10-CM | POA: Diagnosis not present

## 2014-06-11 NOTE — Telephone Encounter (Addendum)
Seth Bake, Can you please call Dr. Lenna Gilford [facility]) and see if they can send [place] this patient's 2014 or 2013 DEXA scan results.

## 2014-06-11 NOTE — Progress Notes (Signed)
CC: [name] is a 79 y.o. female is here for f/u discuss bone density results   Subjective: HPI:  Follow-up osteoarthritis: she recently had a DEXA scan showing a T score of -2.5. I did not have any documentation that she received Reclast this year or in 2014 since it was obtained outside of the [facility] record system.  I have advised her to continue on calcium and vitamin D and I would help arrange her restarting Reclast. Unfortunately this message never got to her because her phone note was prematurely closed by another office. She presents today understandably upset that she is not heard any results yet. She tells me that she had a bone density test in 2014 or 2013 that showed that she gone from the osteoporosis to osteopenia range. She is uncertain about her T score at that time.  Follow-up rheumatoid arthritis: Since I saw her last I had arranged a pain management clinic appointment at [facility].  She provides me with a lengthy story involving practices at that clinic that she believes warrants investigation by a medical board. She complains that she was provided hydromorphone2 mg to be taken 3 times a day and that she only took 1 dose and it sedated her for the majority of the day to the point where she was pretty much not functional other than sitting and sleeping. she is going back to her former pain management specialist tomorrow.   Review Of Systems Outlined In HPI  Past Medical History  Diagnosis Date  . Asthma   . Depression   . GERD (gastroesophageal reflux disease)   . Arthritis     osteoarthritis  . Anxiety   . Osteopenia   . Candida esophagitis   . Diverticulosis   . Tubular adenoma of colon   . Osteoarthritis   . DJD (degenerative joint disease)      L5 compression fracture  . Gout   . Renal failure, unspecified   . Fibromyalgia   . C. difficile diarrhea   . Chronic sinusitis   . Panic disorder   . Spinal stenosis of lumbar region   . Hyperlipidemia    . Hypertension   . Edema extremities   . Depression     Past Surgical History  Procedure Laterality Date  . Total hip arthroplasty  2004    right  . Total knee arthroplasty  2006    right  . Carpal tunnel release  2007, 2009    Bilateral  . Shoulder surgery  2004, 07/30/10, 01/2011    after her right humeral neck fracture, revision hemiarthroplasty 2004 for persistent pain, revision reverse arthroplasty December 2011 for persistent pain, incision and drainage with poly-exchange 01/26/2011 for possible prosthetic joint infection.  . Total abdominal hysterectomy    . Ganglion cyst  wrist    . Ganglion cyct removal on fingers      right  . Tonsillectomy and adenoidectomy    . Nasal sinus surgery    . Total shoulder replacement  2002    right  . Knee arthroscopy      right  . Cataract extraction, bilateral    . Choanal adeniodectomy    . Adenoidectomy     Family History  Problem Relation Age of Onset  . Breast cancer    . Colon polyps    . Cancer Father     bladder  . Diabetes Mother   . Hypertension Mother   . Depression Son  History   Social History  . Marital Status: Married    Spouse Name: N/A    Number of Children: N/A  . Years of Education: N/A   Occupational History  . Not on file.   Social History Main Topics  . Smoking status: Never Smoker   . Smokeless tobacco: Never Used  . Alcohol Use: No  . Drug Use: No  . Sexual Activity: Not on file   Other Topics Concern  . Not on file   Social History Narrative     Objective: BP 147/82 mmHg  Pulse 40  Wt 154 lb (69.854 kg)  Vital signs reviewed. General: Alert and Oriented, No Acute Distress HEENT: Pupils equal, round, reactive to light. Conjunctivae clear.  External ears unremarkable.  Moist mucous membranes. Lungs: Clear and comfortable work of breathing, speaking in full sentences without accessory muscle use. Cardiac: Regular rate and rhythm.  Neuro: CN II-XII grossly intact, gait  normal. Extremities: No peripheral edema.  Strong peripheral pulses.  Mental Status: No depression, anxiety, nor agitation. Logical though process. Very upset Skin: Warm and dry.  Assessment & Plan: [name] was seen today for f/u discuss bone density results.  Diagnoses and associated orders for this visit:  Osteoporosis  Chronic rheumatic arthritis    Osteoporosis: I'm going to request the most recent DEXA scan prior to that which was obtained last month to give her and myself a better idea of how much her T score has actually changed. Since she is in the osteoporotic range I've advised her to continue on weekly last on an annual basis with Dr. Milly Jakob. A good portion of our time today was spent repeating the advice above because she had been receiving conflicting advice when she called our triage line yesterday. Chronic rheumatoid arthritis: She'll continue to see Dr. Macky Lower but will now see her former pain management specialist. The majority of this visit was spent listening to her concerns regarding the safety of who is managing her opiates and I agree with her decision to go back to her former pain management specialist.  40 minutes spent face-to-face during visit today of which at least 50% was counseling or coordinating care regarding: 1. Osteoporosis   2. Chronic rheumatic arthritis       Return if symptoms worsen or fail to improve.

## 2014-06-12 DIAGNOSIS — F331 Major depressive disorder, recurrent, moderate: Secondary | ICD-10-CM | POA: Diagnosis not present

## 2014-06-12 DIAGNOSIS — F410 Panic disorder [episodic paroxysmal anxiety] without agoraphobia: Secondary | ICD-10-CM | POA: Diagnosis not present

## 2014-06-12 DIAGNOSIS — F341 Dysthymic disorder: Secondary | ICD-10-CM | POA: Diagnosis not present

## 2014-06-12 NOTE — Telephone Encounter (Signed)
Sent over request and called and left a vm on [facility] medical records

## 2014-06-18 DIAGNOSIS — M797 Fibromyalgia: Secondary | ICD-10-CM | POA: Diagnosis not present

## 2014-06-18 DIAGNOSIS — M130 Polyarthritis, unspecified: Secondary | ICD-10-CM | POA: Diagnosis not present

## 2014-06-18 DIAGNOSIS — M150 Primary generalized (osteo)arthritis: Secondary | ICD-10-CM | POA: Diagnosis not present

## 2014-06-18 DIAGNOSIS — Z79899 Other long term (current) drug therapy: Secondary | ICD-10-CM | POA: Diagnosis not present

## 2014-06-18 DIAGNOSIS — M069 Rheumatoid arthritis, unspecified: Secondary | ICD-10-CM | POA: Diagnosis not present

## 2014-06-19 ENCOUNTER — Ambulatory Visit (HOSPITAL_COMMUNITY): Payer: Self-pay | Admitting: Psychiatry

## 2014-07-03 DIAGNOSIS — M179 Osteoarthritis of knee, unspecified: Secondary | ICD-10-CM | POA: Diagnosis not present

## 2014-07-03 DIAGNOSIS — F331 Major depressive disorder, recurrent, moderate: Secondary | ICD-10-CM | POA: Diagnosis not present

## 2014-07-03 DIAGNOSIS — M069 Rheumatoid arthritis, unspecified: Secondary | ICD-10-CM | POA: Diagnosis not present

## 2014-07-03 DIAGNOSIS — M2550 Pain in unspecified joint: Secondary | ICD-10-CM | POA: Diagnosis not present

## 2014-07-03 DIAGNOSIS — F410 Panic disorder [episodic paroxysmal anxiety] without agoraphobia: Secondary | ICD-10-CM | POA: Diagnosis not present

## 2014-07-03 DIAGNOSIS — G894 Chronic pain syndrome: Secondary | ICD-10-CM | POA: Diagnosis not present

## 2014-07-03 DIAGNOSIS — F341 Dysthymic disorder: Secondary | ICD-10-CM | POA: Diagnosis not present

## 2014-08-06 DIAGNOSIS — Z79891 Long term (current) use of opiate analgesic: Secondary | ICD-10-CM | POA: Diagnosis not present

## 2014-08-06 DIAGNOSIS — G894 Chronic pain syndrome: Secondary | ICD-10-CM | POA: Diagnosis not present

## 2014-08-06 DIAGNOSIS — M179 Osteoarthritis of knee, unspecified: Secondary | ICD-10-CM | POA: Diagnosis not present

## 2014-08-06 DIAGNOSIS — M2550 Pain in unspecified joint: Secondary | ICD-10-CM | POA: Diagnosis not present

## 2014-08-06 DIAGNOSIS — M069 Rheumatoid arthritis, unspecified: Secondary | ICD-10-CM | POA: Diagnosis not present

## 2014-08-12 DIAGNOSIS — F331 Major depressive disorder, recurrent, moderate: Secondary | ICD-10-CM | POA: Diagnosis not present

## 2014-08-12 DIAGNOSIS — F341 Dysthymic disorder: Secondary | ICD-10-CM | POA: Diagnosis not present

## 2014-08-12 DIAGNOSIS — F410 Panic disorder [episodic paroxysmal anxiety] without agoraphobia: Secondary | ICD-10-CM | POA: Diagnosis not present

## 2014-08-13 ENCOUNTER — Telehealth: Payer: Self-pay | Admitting: *Deleted

## 2014-08-13 ENCOUNTER — Other Ambulatory Visit: Payer: Self-pay | Admitting: *Deleted

## 2014-08-13 MED ORDER — FLUTICASONE PROPIONATE 50 MCG/ACT NA SUSP: NASAL | Status: DC

## 2014-08-13 NOTE — Telephone Encounter (Addendum)
Pt requests a rx for flonase. Looks like it has not ever been rx'ed by [facility]. Dr. Burnett Harry rx'ed it to her in [facility] back in November. On vm pt states she has some nasal congestion and flonase really helped. Called pt and let her know rx would be sent and to f/u with hommel if sxs don't improvement

## 2014-08-20 DIAGNOSIS — F341 Dysthymic disorder: Secondary | ICD-10-CM | POA: Diagnosis not present

## 2014-08-20 DIAGNOSIS — F331 Major depressive disorder, recurrent, moderate: Secondary | ICD-10-CM | POA: Diagnosis not present

## 2014-08-20 DIAGNOSIS — F410 Panic disorder [episodic paroxysmal anxiety] without agoraphobia: Secondary | ICD-10-CM | POA: Diagnosis not present

## 2014-08-22 ENCOUNTER — Ambulatory Visit: Payer: Self-pay | Admitting: Family Medicine

## 2014-08-26 ENCOUNTER — Ambulatory Visit (INDEPENDENT_AMBULATORY_CARE_PROVIDER_SITE_OTHER): Payer: Medicare Other | Admitting: Family Medicine

## 2014-08-26 ENCOUNTER — Encounter: Payer: Self-pay | Admitting: Family Medicine

## 2014-08-26 VITALS — BP 131/71 | HR 81 | Wt 158.0 lb

## 2014-08-26 DIAGNOSIS — I10 Essential (primary) hypertension: Secondary | ICD-10-CM | POA: Diagnosis not present

## 2014-08-26 DIAGNOSIS — E785 Hyperlipidemia, unspecified: Secondary | ICD-10-CM | POA: Diagnosis not present

## 2014-08-26 DIAGNOSIS — B9689 Other specified bacterial agents as the cause of diseases classified elsewhere: Secondary | ICD-10-CM

## 2014-08-26 DIAGNOSIS — J329 Chronic sinusitis, unspecified: Secondary | ICD-10-CM

## 2014-08-26 DIAGNOSIS — A499 Bacterial infection, unspecified: Secondary | ICD-10-CM | POA: Diagnosis not present

## 2014-08-26 DIAGNOSIS — M810 Age-related osteoporosis without current pathological fracture: Secondary | ICD-10-CM

## 2014-08-26 MED ORDER — AMOXICILLIN 500 MG PO CAPS: 500.0000 mg | ORAL_CAPSULE | Freq: Three times a day (TID) | ORAL | Status: DC

## 2014-08-26 NOTE — Progress Notes (Signed)
CC: [name] is a 81 y.o. female is here for 3 month f/u   Subjective: HPI:   follow-up dyslipidemia: Since I saw her in October she has not been taking any cholesterol-lowering medication. She was on a statin but  Wasn't convinced that she needed to be on this medication and she has taken a three-month holiday. She denies any known intolerance to the  Statin class of medication. No chest pain motor or sensory disturbances, nor than claudication since I saw her last   Follow-up essential hypertension: Continues to take amlodipine 10 mg on a daily basis. No outside blood pressures to report.  no shortness of breath, orthopnea nor peripheral edema   complaints of 2 weeks of facial pressure localizing the forehead and cheeks radiates into the back of the eyes. Worse when leaning forward. Present all hours of the day. Mild to moderate in severity and has been persistent since onset.  Family by thick nasal congestion and postnasal drip. Denies cough chest pain or headache other than  That described above. Denies fevers or chills   Review Of Systems Outlined In HPI  Past Medical History  Diagnosis Date  . Asthma   . Depression   . GERD (gastroesophageal reflux disease)   . Arthritis     osteoarthritis  . Anxiety   . Osteopenia   . Candida esophagitis   . Diverticulosis   . Tubular adenoma of colon   . Osteoarthritis   . DJD (degenerative joint disease)      L5 compression fracture  . Gout   . Renal failure, unspecified   . Fibromyalgia   . C. difficile diarrhea   . Chronic sinusitis   . Panic disorder   . Spinal stenosis of lumbar region   . Hyperlipidemia   . Hypertension   . Edema extremities   . Depression     Past Surgical History  Procedure Laterality Date  . Total hip arthroplasty  2004    right  . Total knee arthroplasty  2006    right  . Carpal tunnel release  2007, 2009    Bilateral  . Shoulder surgery  2004, 07/30/10, 01/2011    after her right humeral  neck fracture, revision hemiarthroplasty 2004 for persistent pain, revision reverse arthroplasty December 2011 for persistent pain, incision and drainage with poly-exchange 01/26/2011 for possible prosthetic joint infection.  . Total abdominal hysterectomy    . Ganglion cyst  wrist    . Ganglion cyct removal on fingers      right  . Tonsillectomy and adenoidectomy    . Nasal sinus surgery    . Total shoulder replacement  2002    right  . Knee arthroscopy      right  . Cataract extraction, bilateral    . Choanal adeniodectomy    . Adenoidectomy     Family History  Problem Relation Age of Onset  . Breast cancer    . Colon polyps    . Cancer Father     bladder  . Diabetes Mother   . Hypertension Mother   . Depression Son     History   Social History  . Marital Status: Married    Spouse Name: N/A    Number of Children: N/A  . Years of Education: N/A   Occupational History  . Not on file.   Social History Main Topics  . Smoking status: Never Smoker   . Smokeless tobacco: Never Used  . Alcohol Use: No  .  Drug Use: No  . Sexual Activity: Not on file   Other Topics Concern  . Not on file   Social History Narrative     Objective: BP 131/71 mmHg  Pulse 81  Wt 158 lb (71.668 kg)  General: Alert and Oriented, No Acute Distress HEENT: Pupils equal, round, reactive to light. Conjunctivae clear.  External ears unremarkable, canals clear with intact TMs with appropriate landmarks.  Middle ear appears open without effusion. Pink inferior turbinates.  Moist mucous membranes, pharynx without inflammation nor lesions.  Neck supple without palpable lymphadenopathy nor abnormal masses. Lungs: Clear to auscultation bilaterally, no wheezing/ronchi/rales.  Comfortable work of breathing. Good air movement. Cardiac: Regular rate and rhythm.  Extremities: No peripheral edema.  Strong peripheral pulses.  Mental Status: No depression, anxiety, nor agitation. Skin: Warm and  dry.  Assessment & Plan: [name] was seen today for 3 month f/u.  Diagnoses and associated orders for this visit:  Dyslipidemia - Lipid panel  Essential hypertension  Bacterial sinusitis - amoxicillin (AMOXIL) 500 MG capsule; Take 1 capsule (500 mg total) by mouth 3 (three) times daily.  Osteoporosis     dyslipidemia: She's not been fasting but will return at her convenience for fasting lipid panel to calculate her AHA 10 year risk  And whether she needs to restart simvastatin  Essential hypertension: Controlled continue amlodipine  bacterial sinusitis: she tells me she gets GI side effects such as diarrhea and also  Urinary frequency when taking Augmentin,  Joint decision to try amoxicillin alone and if no benefit in 2 days will switch to Augmentin.  osteoporosis:  Clarified to her that  The DEXA scan from 2013 did indeed reflect that her T score was in the osteopenia range  However the left  Forearm was not tested in this study  And when this site was checked in October 2015 it produced a T score in the  Osteoporotic range. Encouraged to keep taking Reclast on an annual basis.    Return in about 3 months (around 11/25/2014) for Blood Pressure.

## 2014-09-02 ENCOUNTER — Encounter: Payer: Self-pay | Admitting: Family Medicine

## 2014-09-03 DIAGNOSIS — M179 Osteoarthritis of knee, unspecified: Secondary | ICD-10-CM | POA: Diagnosis not present

## 2014-09-03 DIAGNOSIS — M2550 Pain in unspecified joint: Secondary | ICD-10-CM | POA: Diagnosis not present

## 2014-09-03 DIAGNOSIS — G894 Chronic pain syndrome: Secondary | ICD-10-CM | POA: Diagnosis not present

## 2014-09-03 DIAGNOSIS — M069 Rheumatoid arthritis, unspecified: Secondary | ICD-10-CM | POA: Diagnosis not present

## 2014-09-04 DIAGNOSIS — E785 Hyperlipidemia, unspecified: Secondary | ICD-10-CM | POA: Diagnosis not present

## 2014-09-05 ENCOUNTER — Telehealth: Payer: Self-pay | Admitting: Family Medicine

## 2014-09-05 DIAGNOSIS — E785 Hyperlipidemia, unspecified: Secondary | ICD-10-CM

## 2014-09-05 LAB — LIPID PANEL
Cholesterol: 168 mg/dL (ref 0–200)
HDL: 42 mg/dL (ref >39)
LDL Cholesterol: 96 mg/dL (ref 0–99)
Total CHOL/HDL Ratio: 4 Ratio
Triglycerides: 148 mg/dL (ref <150)
VLDL: 30 mg/dL (ref 0–40)

## 2014-09-05 MED ORDER — SIMVASTATIN 20 MG PO TABS: 20.0000 mg | ORAL_TABLET | Freq: Every day | ORAL | Status: DC

## 2014-09-05 NOTE — Telephone Encounter (Signed)
Seth Bake, Will you please let patient know that her cholesterol numbers put her at a 30% estimated risk of having a heart attack in the next ten years.  This can be reduced but a third by restarting her former regimen of simvastatin, I'd recommend she restart this and a Rx has been sent to [facility]

## 2014-09-05 NOTE — Telephone Encounter (Signed)
Pt.notified

## 2014-09-08 ENCOUNTER — Telehealth: Payer: Self-pay

## 2014-09-08 MED ORDER — AMOXICILLIN-POT CLAVULANATE 500-125 MG PO TABS: ORAL_TABLET | ORAL | Status: AC

## 2014-09-08 NOTE — Telephone Encounter (Signed)
[name] states she still has nasal congestion, itching in throat and some cough. She states Dr Ileene Rubens would switch her to Augmentin. The note did advised to switch after two days and it has been two weeks. I asked if she has tried some OTC medications and she states she cannot take most OTC due to allergies. She uses [facility].

## 2014-09-08 NOTE — Telephone Encounter (Signed)
Rx has been sent to [facility]

## 2014-09-11 ENCOUNTER — Telehealth: Payer: Self-pay | Admitting: Emergency Medicine

## 2014-09-11 ENCOUNTER — Telehealth: Payer: Self-pay | Admitting: *Deleted

## 2014-09-11 MED ORDER — OMEPRAZOLE 20 MG PO CPDR: 20.0000 mg | DELAYED_RELEASE_CAPSULE | Freq: Every day | ORAL | Status: DC

## 2014-09-11 NOTE — Telephone Encounter (Signed)
rx sent to [facility]

## 2014-09-11 NOTE — Telephone Encounter (Signed)
Pt requesting a rx for omeprazole. Hasn't been rx'ed by any provider here so have to rout to provider to rx

## 2014-09-16 DIAGNOSIS — F341 Dysthymic disorder: Secondary | ICD-10-CM | POA: Diagnosis not present

## 2014-09-16 DIAGNOSIS — F331 Major depressive disorder, recurrent, moderate: Secondary | ICD-10-CM | POA: Diagnosis not present

## 2014-09-16 DIAGNOSIS — F410 Panic disorder [episodic paroxysmal anxiety] without agoraphobia: Secondary | ICD-10-CM | POA: Diagnosis not present

## 2014-09-18 DIAGNOSIS — M153 Secondary multiple arthritis: Secondary | ICD-10-CM | POA: Diagnosis not present

## 2014-09-18 DIAGNOSIS — Z79899 Other long term (current) drug therapy: Secondary | ICD-10-CM | POA: Diagnosis not present

## 2014-09-18 DIAGNOSIS — M069 Rheumatoid arthritis, unspecified: Secondary | ICD-10-CM | POA: Diagnosis not present

## 2014-09-26 ENCOUNTER — Telehealth: Payer: Self-pay | Admitting: *Deleted

## 2014-09-30 DIAGNOSIS — F331 Major depressive disorder, recurrent, moderate: Secondary | ICD-10-CM | POA: Diagnosis not present

## 2014-09-30 DIAGNOSIS — F341 Dysthymic disorder: Secondary | ICD-10-CM | POA: Diagnosis not present

## 2014-09-30 DIAGNOSIS — F410 Panic disorder [episodic paroxysmal anxiety] without agoraphobia: Secondary | ICD-10-CM | POA: Diagnosis not present

## 2014-10-01 DIAGNOSIS — M069 Rheumatoid arthritis, unspecified: Secondary | ICD-10-CM | POA: Diagnosis not present

## 2014-10-01 DIAGNOSIS — M179 Osteoarthritis of knee, unspecified: Secondary | ICD-10-CM | POA: Diagnosis not present

## 2014-10-01 DIAGNOSIS — M2550 Pain in unspecified joint: Secondary | ICD-10-CM | POA: Diagnosis not present

## 2014-10-01 DIAGNOSIS — G894 Chronic pain syndrome: Secondary | ICD-10-CM | POA: Diagnosis not present

## 2014-10-08 DIAGNOSIS — F341 Dysthymic disorder: Secondary | ICD-10-CM | POA: Diagnosis not present

## 2014-10-08 DIAGNOSIS — F331 Major depressive disorder, recurrent, moderate: Secondary | ICD-10-CM | POA: Diagnosis not present

## 2014-10-08 DIAGNOSIS — F410 Panic disorder [episodic paroxysmal anxiety] without agoraphobia: Secondary | ICD-10-CM | POA: Diagnosis not present

## 2014-10-09 ENCOUNTER — Ambulatory Visit (INDEPENDENT_AMBULATORY_CARE_PROVIDER_SITE_OTHER): Payer: Medicare Other | Admitting: Sports Medicine

## 2014-10-09 ENCOUNTER — Ambulatory Visit (INDEPENDENT_AMBULATORY_CARE_PROVIDER_SITE_OTHER): Payer: Medicare Other

## 2014-10-09 DIAGNOSIS — M25571 Pain in right ankle and joints of right foot: Secondary | ICD-10-CM

## 2014-10-09 DIAGNOSIS — M7731 Calcaneal spur, right foot: Secondary | ICD-10-CM | POA: Diagnosis not present

## 2014-10-09 DIAGNOSIS — M79601 Pain in right arm: Secondary | ICD-10-CM | POA: Diagnosis not present

## 2014-10-09 DIAGNOSIS — M79604 Pain in right leg: Secondary | ICD-10-CM

## 2014-10-09 DIAGNOSIS — M7989 Other specified soft tissue disorders: Secondary | ICD-10-CM | POA: Diagnosis not present

## 2014-10-09 NOTE — Progress Notes (Signed)
   Subjective:    I'm seeing this patient as a consultation for:  Dr. Ileene Rubens  CC: Right ankle pain  HPI: This is a pleasant 81 year old female with history of rheumatoid arthritis with persistent pain in the anterolateral aspect of her right ankle at the talocrural joint laterally. Moderate, persistent without radiation, she does have occasional morning stiffness.  Past medical history, Surgical history, Family history not pertinant except as noted below, Social history, Allergies, and medications have been entered into the medical record, reviewed, and no changes needed.   Review of Systems: No headache, visual changes, nausea, vomiting, diarrhea, constipation, dizziness, abdominal pain, skin rash, fevers, chills, night sweats, weight loss, swollen lymph nodes, body aches, joint swelling, muscle aches, chest pain, shortness of breath, mood changes, visual or auditory hallucinations.   Objective:   General: Well Developed, well nourished, and in no acute distress.  Neuro/Psych: Alert and oriented x3, extra-ocular muscles intact, able to move all 4 extremities, sensation grossly intact. Skin: Warm and dry, no rashes noted.  Respiratory: Not using accessory muscles, speaking in full sentences, trachea midline.  Cardiovascular: Pulses palpable, no extremity edema. Abdomen: Does not appear distended. Right Ankle: No visible erythema or swelling. Range of motion is full in all directions. Strength is 5/5 in all directions. Stable lateral and medial ligaments; squeeze test and kleiger test unremarkable; Tender to palpation at the lateral talocrural joint. No pain at base of 5th MT; No tenderness over cuboid; No tenderness over N spot or navicular prominence No tenderness on posterior aspects of lateral and medial malleolus No sign of peroneal tendon subluxations; Negative tarsal tunnel tinel's Able to walk 4 steps. There is mild pain somewhat further up on the mid fibula.   Procedure:  Real-time Ultrasound Guided Injection of Right talocrural joint  Device: GE Logiq E  Verbal informed consent obtained.  Time-out conducted.  Noted no overlying erythema, induration, or other signs of local infection.  Skin prepped in a sterile fashion.  Local anesthesia: Topical Ethyl chloride.  With sterile technique and under real time ultrasound guidance:  25-gauge needle advanced into the talocrural joint, 1 mL kenalog 40, 3 mL lidocaine injected easily.  Completed without difficulty  Talocrural pain immediately resolved however there was some residual pain higher up on the fibula after the injection. Advised to call if fevers/chills, erythema, induration, drainage, or persistent bleeding.  Images permanently stored and available for review in the ultrasound unit.  Impression: Technically successful ultrasound guided injection.  Impression and Recommendations:   This case required medical decision making of moderate complexity.

## 2014-10-09 NOTE — Assessment & Plan Note (Addendum)
Talocrural osteoarthritis as visualized on x-ray. Talocrural injection as above, after the injection she had some residual pain in the mid fibula higher up, we are going to get another set of images to further evaluate this. Return to see me in one month.

## 2014-10-10 ENCOUNTER — Ambulatory Visit: Payer: Self-pay | Admitting: Family Medicine

## 2014-10-16 DIAGNOSIS — M2550 Pain in unspecified joint: Secondary | ICD-10-CM | POA: Diagnosis not present

## 2014-10-16 DIAGNOSIS — G894 Chronic pain syndrome: Secondary | ICD-10-CM | POA: Diagnosis not present

## 2014-10-16 DIAGNOSIS — M069 Rheumatoid arthritis, unspecified: Secondary | ICD-10-CM | POA: Diagnosis not present

## 2014-10-16 DIAGNOSIS — M179 Osteoarthritis of knee, unspecified: Secondary | ICD-10-CM | POA: Diagnosis not present

## 2014-10-16 DIAGNOSIS — G5761 Lesion of plantar nerve, right lower limb: Secondary | ICD-10-CM | POA: Diagnosis not present

## 2014-10-29 DIAGNOSIS — M47816 Spondylosis without myelopathy or radiculopathy, lumbar region: Secondary | ICD-10-CM | POA: Diagnosis not present

## 2014-10-29 DIAGNOSIS — M2550 Pain in unspecified joint: Secondary | ICD-10-CM | POA: Diagnosis not present

## 2014-10-29 DIAGNOSIS — G894 Chronic pain syndrome: Secondary | ICD-10-CM | POA: Diagnosis not present

## 2014-10-29 DIAGNOSIS — M069 Rheumatoid arthritis, unspecified: Secondary | ICD-10-CM | POA: Diagnosis not present

## 2014-11-04 DIAGNOSIS — F341 Dysthymic disorder: Secondary | ICD-10-CM | POA: Diagnosis not present

## 2014-11-04 DIAGNOSIS — F331 Major depressive disorder, recurrent, moderate: Secondary | ICD-10-CM | POA: Diagnosis not present

## 2014-11-04 DIAGNOSIS — F410 Panic disorder [episodic paroxysmal anxiety] without agoraphobia: Secondary | ICD-10-CM | POA: Diagnosis not present

## 2014-11-06 ENCOUNTER — Ambulatory Visit: Payer: Self-pay | Admitting: Sports Medicine

## 2014-11-25 ENCOUNTER — Encounter: Payer: Self-pay | Admitting: Family Medicine

## 2014-11-25 ENCOUNTER — Ambulatory Visit (INDEPENDENT_AMBULATORY_CARE_PROVIDER_SITE_OTHER): Payer: Medicare Other | Admitting: Family Medicine

## 2014-11-25 VITALS — BP 133/72 | HR 95 | Wt 163.0 lb

## 2014-11-25 DIAGNOSIS — E785 Hyperlipidemia, unspecified: Secondary | ICD-10-CM | POA: Diagnosis not present

## 2014-11-25 DIAGNOSIS — R0981 Nasal congestion: Secondary | ICD-10-CM

## 2014-11-25 DIAGNOSIS — K5909 Other constipation: Secondary | ICD-10-CM | POA: Diagnosis not present

## 2014-11-25 DIAGNOSIS — K219 Gastro-esophageal reflux disease without esophagitis: Secondary | ICD-10-CM | POA: Diagnosis not present

## 2014-11-25 DIAGNOSIS — I10 Essential (primary) hypertension: Secondary | ICD-10-CM | POA: Diagnosis not present

## 2014-11-25 DIAGNOSIS — T402X5A Adverse effect of other opioids, initial encounter: Secondary | ICD-10-CM

## 2014-11-25 DIAGNOSIS — K5903 Drug induced constipation: Secondary | ICD-10-CM

## 2014-11-25 MED ORDER — LUBIPROSTONE 24 MCG PO CAPS: 24.0000 ug | ORAL_CAPSULE | Freq: Two times a day (BID) | ORAL | Status: DC

## 2014-11-25 MED ORDER — IPRATROPIUM BROMIDE 0.06 % NA SOLN
2.0000 | Freq: Three times a day (TID) | NASAL | Status: DC | PRN
Start: 2014-11-25 — End: 2017-02-09

## 2014-11-25 NOTE — Progress Notes (Signed)
CC: [name] is a 81 y.o. female is here for Hypertension   Subjective: HPI:  Follow-up essential hypertension: Continues to take amlodipine at home without any known side effects. Denies chest pain shortness of breath orthopnea nor peripheral edema.  Follow-up dyslipidemia: Since I saw her last she restarted taking simvastatin. She is taking this on a daily basis without right upper quadrant pain or myalgias.  Follow-up GERD: She requested a refill of a former prescription of omeprazole at 20 mg since I saw her last. Refills were sent and she tells me that this is helping with epigastric discomfort with no known side effects  Her major complaint today is constipation that she's been dealing with for many years now and is having more more lack of success with over-the-counter methods such as stool softeners or dietary changes. Symptoms of them present ever since she began taking opiates by mouth or transdermal preparations. It sounds that she gets alternating constipation with diarrhea after it reaches a certain severity and the cycles every 1 or 2. Pain is described as fullness and pressure that is only relieved after defecation in the lower abdomen.  Complains of continued nasal congestion despite using nasal fluticasone on a daily basis. Seems to be draining down the back of the throat more than anteriorly. Symptoms are present on a daily basis moderate in severity. It appears clear. She denies any facial pressure sneezing or sore throat.   Review Of Systems Outlined In HPI  Past Medical History  Diagnosis Date  . Asthma   . Depression   . GERD (gastroesophageal reflux disease)   . Arthritis     osteoarthritis  . Anxiety   . Osteopenia   . Candida esophagitis   . Diverticulosis   . Tubular adenoma of colon   . Osteoarthritis   . DJD (degenerative joint disease)      L5 compression fracture  . Gout   . Renal failure, unspecified   . Fibromyalgia   . C. difficile  diarrhea   . Chronic sinusitis   . Panic disorder   . Spinal stenosis of lumbar region   . Hyperlipidemia   . Hypertension   . Edema extremities   . Depression     Past Surgical History  Procedure Laterality Date  . Total hip arthroplasty  2004    right  . Total knee arthroplasty  2006    right  . Carpal tunnel release  2007, 2009    Bilateral  . Shoulder surgery  2004, 07/30/10, 01/2011    after her right humeral neck fracture, revision hemiarthroplasty 2004 for persistent pain, revision reverse arthroplasty December 2011 for persistent pain, incision and drainage with poly-exchange 01/26/2011 for possible prosthetic joint infection.  . Total abdominal hysterectomy    . Ganglion cyst  wrist    . Ganglion cyct removal on fingers      right  . Tonsillectomy and adenoidectomy    . Nasal sinus surgery    . Total shoulder replacement  2002    right  . Knee arthroscopy      right  . Cataract extraction, bilateral    . Choanal adeniodectomy    . Adenoidectomy     Family History  Problem Relation Age of Onset  . Breast cancer    . Colon polyps    . Cancer Father     bladder  . Diabetes Mother   . Hypertension Mother   . Depression Son     History  Social History  . Marital Status: Married    Spouse Name: N/A  . Number of Children: N/A  . Years of Education: N/A   Occupational History  . Not on file.   Social History Main Topics  . Smoking status: Never Smoker   . Smokeless tobacco: Never Used  . Alcohol Use: No  . Drug Use: No  . Sexual Activity: Not on file   Other Topics Concern  . Not on file   Social History Narrative     Objective: BP 133/72 mmHg  Pulse 95  Wt 163 lb (73.936 kg)  General: Alert and Oriented, No Acute Distress HEENT: Pupils equal, round, reactive to light. Conjunctivae clear. Pink inferior turbinates.  Moist mucous membranes, pharynx without inflammation nor lesions.  Neck supple without palpable lymphadenopathy nor abnormal  masses. Lungs: Clear to auscultation bilaterally, no wheezing/ronchi/rales.  Comfortable work of breathing. Good air movement. Cardiac: Regular rate and rhythm. Normal S1/S2.  No murmurs, rubs, nor gallops.   Extremities: No peripheral edema.  Strong peripheral pulses.  Mental Status: No depression, anxiety, nor agitation. Skin: Warm and dry.  Assessment & Plan: [name] was seen today for hypertension.  Diagnoses and all orders for this visit:  Essential hypertension  Dyslipidemia  Gastroesophageal reflux disease without esophagitis  Constipation due to opioid therapy Orders: -     lubiprostone (AMITIZA) 24 MCG capsule; Take 1 capsule (24 mcg total) by mouth 2 (two) times daily with a meal. To prevent constipation.  Nasal congestion Orders: -     ipratropium (ATROVENT) 0.06 % nasal spray; Place 2 sprays into both nostrils 3 (three) times daily as needed for rhinitis.   Essential hypertension: Controlled continue amlodipine Dyslipidemia: Continue simvastatin indefinitely GERD: Controlled on omeprazole Constipation: Uncontrolled chronic condition begin Amitiza Nasal congestion: Consider stopping nasal fluticasone and switch to Atrovent  Return in about 3 months (around 02/24/2015) for BP.

## 2014-11-26 ENCOUNTER — Other Ambulatory Visit: Payer: Self-pay | Admitting: *Deleted

## 2014-11-26 DIAGNOSIS — G894 Chronic pain syndrome: Secondary | ICD-10-CM | POA: Diagnosis not present

## 2014-11-26 DIAGNOSIS — M2550 Pain in unspecified joint: Secondary | ICD-10-CM | POA: Diagnosis not present

## 2014-11-26 DIAGNOSIS — M47816 Spondylosis without myelopathy or radiculopathy, lumbar region: Secondary | ICD-10-CM | POA: Diagnosis not present

## 2014-11-26 DIAGNOSIS — M069 Rheumatoid arthritis, unspecified: Secondary | ICD-10-CM | POA: Diagnosis not present

## 2014-11-26 MED ORDER — AMLODIPINE BESYLATE 10 MG PO TABS: 10.0000 mg | ORAL_TABLET | Freq: Every day | ORAL | Status: DC

## 2014-11-27 ENCOUNTER — Telehealth: Payer: Self-pay

## 2014-11-27 ENCOUNTER — Telehealth: Payer: Self-pay | Admitting: Family Medicine

## 2014-11-27 DIAGNOSIS — F331 Major depressive disorder, recurrent, moderate: Secondary | ICD-10-CM | POA: Diagnosis not present

## 2014-11-27 DIAGNOSIS — F410 Panic disorder [episodic paroxysmal anxiety] without agoraphobia: Secondary | ICD-10-CM | POA: Diagnosis not present

## 2014-11-27 DIAGNOSIS — F341 Dysthymic disorder: Secondary | ICD-10-CM | POA: Diagnosis not present

## 2014-11-27 DIAGNOSIS — Z5181 Encounter for therapeutic drug level monitoring: Secondary | ICD-10-CM

## 2014-11-27 DIAGNOSIS — Z7983 Long term (current) use of bisphosphonates: Principal | ICD-10-CM

## 2014-11-27 NOTE — Telephone Encounter (Signed)
Mila Palmer called and would like to know when she will be set up for Reclast. Do you need a form or has on already been filled out?

## 2014-11-27 NOTE — Telephone Encounter (Signed)
Called and left message with pt to see if she was still seeing an office with [facility] who was arranging her reclast injections. We have not provided this for but we are able to. Just confirming with pt to see if does have a provider that has been arranging this for her

## 2014-11-27 NOTE — Telephone Encounter (Signed)
Received fax for Pa on Amitiza sent through cover my meds wiating on auth. - CF

## 2014-12-01 NOTE — Telephone Encounter (Signed)
Amitiza has been approved through [place]

## 2014-12-05 NOTE — Telephone Encounter (Signed)
The form asks why the patient didn't tolerate oral mes for osteoporosis so called and left message on vm and also pt has to have blood work

## 2014-12-09 DIAGNOSIS — Z5181 Encounter for therapeutic drug level monitoring: Secondary | ICD-10-CM | POA: Diagnosis not present

## 2014-12-09 DIAGNOSIS — Z7983 Long term (current) use of bisphosphonates: Secondary | ICD-10-CM | POA: Diagnosis not present

## 2014-12-09 NOTE — Telephone Encounter (Signed)
Pt needs to have blood work. She is coming tomorrow.order placed for CMP to have creatinine and calcium checked

## 2014-12-09 NOTE — Addendum Note (Signed)
Addended by: Terance Hart on: 12/09/2014 04:34 PM   Modules accepted: Orders

## 2014-12-10 LAB — COMPLETE METABOLIC PANEL WITH GFR
ALT: 35 U/L (ref 0–35)
AST: 38 U/L — ABNORMAL HIGH (ref 0–37)
Albumin: 3.7 g/dL (ref 3.5–5.2)
Alkaline Phosphatase: 77 U/L (ref 39–117)
BUN: 8 mg/dL (ref 6–23)
CALCIUM: 9 mg/dL (ref 8.4–10.5)
CHLORIDE: 102 meq/L (ref 96–112)
CO2: 27 mEq/L (ref 19–32)
Creat: 0.83 mg/dL (ref 0.50–1.10)
GFR, EST AFRICAN AMERICAN: 78 mL/min
GFR, Est Non African American: 67 mL/min
GLUCOSE: 147 mg/dL — AB (ref 70–99)
Potassium: 3.4 mEq/L — ABNORMAL LOW (ref 3.5–5.3)
Sodium: 140 mEq/L (ref 135–145)
Total Bilirubin: 0.5 mg/dL (ref 0.2–1.2)
Total Protein: 6.3 g/dL (ref 6.0–8.3)

## 2014-12-17 DIAGNOSIS — M810 Age-related osteoporosis without current pathological fracture: Secondary | ICD-10-CM | POA: Diagnosis not present

## 2014-12-17 DIAGNOSIS — M153 Secondary multiple arthritis: Secondary | ICD-10-CM | POA: Diagnosis not present

## 2014-12-17 DIAGNOSIS — Z79899 Other long term (current) drug therapy: Secondary | ICD-10-CM | POA: Diagnosis not present

## 2014-12-17 DIAGNOSIS — M069 Rheumatoid arthritis, unspecified: Secondary | ICD-10-CM | POA: Diagnosis not present

## 2014-12-17 DIAGNOSIS — M25562 Pain in left knee: Secondary | ICD-10-CM | POA: Diagnosis not present

## 2014-12-24 ENCOUNTER — Telehealth: Payer: Self-pay | Admitting: *Deleted

## 2014-12-24 NOTE — Telephone Encounter (Signed)
Scheduled reclast infusion for June 10th at 300.pt notified .this is going to require a PA through her insurance. Dx is osteoporosis

## 2014-12-25 ENCOUNTER — Ambulatory Visit (HOSPITAL_COMMUNITY)
Admission: RE | Admit: 2014-12-25 | Discharge: 2014-12-25 | Disposition: A | Discharge status: Home / Self Care | Payer: Medicare Other | Source: Ambulatory Visit | Attending: Family Medicine | Admitting: Family Medicine

## 2014-12-25 DIAGNOSIS — F341 Dysthymic disorder: Secondary | ICD-10-CM | POA: Diagnosis not present

## 2014-12-25 DIAGNOSIS — G894 Chronic pain syndrome: Secondary | ICD-10-CM | POA: Diagnosis not present

## 2014-12-25 DIAGNOSIS — M47816 Spondylosis without myelopathy or radiculopathy, lumbar region: Secondary | ICD-10-CM | POA: Diagnosis not present

## 2014-12-25 DIAGNOSIS — F410 Panic disorder [episodic paroxysmal anxiety] without agoraphobia: Secondary | ICD-10-CM | POA: Diagnosis not present

## 2014-12-25 DIAGNOSIS — M069 Rheumatoid arthritis, unspecified: Secondary | ICD-10-CM | POA: Diagnosis not present

## 2014-12-25 DIAGNOSIS — F331 Major depressive disorder, recurrent, moderate: Secondary | ICD-10-CM | POA: Diagnosis not present

## 2014-12-25 DIAGNOSIS — M2550 Pain in unspecified joint: Secondary | ICD-10-CM | POA: Diagnosis not present

## 2014-12-26 NOTE — Telephone Encounter (Signed)
Based on Pt's insurance (Medicare Part A&B, and BCBC Supplementary) no authorization will be required.

## 2014-12-26 NOTE — Telephone Encounter (Signed)
[employer], spoke with [place]. No authorization required. Call reference #: [id number].

## 2014-12-31 ENCOUNTER — Encounter: Payer: Self-pay | Admitting: Sports Medicine

## 2015-01-01 ENCOUNTER — Ambulatory Visit (INDEPENDENT_AMBULATORY_CARE_PROVIDER_SITE_OTHER): Payer: Medicare Other

## 2015-01-01 ENCOUNTER — Encounter: Payer: Self-pay | Admitting: Family Medicine

## 2015-01-01 ENCOUNTER — Ambulatory Visit (INDEPENDENT_AMBULATORY_CARE_PROVIDER_SITE_OTHER): Payer: Medicare Other | Admitting: Family Medicine

## 2015-01-01 ENCOUNTER — Ambulatory Visit (INDEPENDENT_AMBULATORY_CARE_PROVIDER_SITE_OTHER): Payer: Medicare Other | Admitting: Sports Medicine

## 2015-01-01 VITALS — BP 141/78 | HR 91 | Wt 163.0 lb

## 2015-01-01 DIAGNOSIS — M25561 Pain in right knee: Secondary | ICD-10-CM | POA: Diagnosis not present

## 2015-01-01 DIAGNOSIS — Z96653 Presence of artificial knee joint, bilateral: Secondary | ICD-10-CM

## 2015-01-01 DIAGNOSIS — J302 Other seasonal allergic rhinitis: Secondary | ICD-10-CM | POA: Diagnosis not present

## 2015-01-01 DIAGNOSIS — Z471 Aftercare following joint replacement surgery: Secondary | ICD-10-CM | POA: Diagnosis not present

## 2015-01-01 DIAGNOSIS — M1712 Unilateral primary osteoarthritis, left knee: Secondary | ICD-10-CM | POA: Diagnosis not present

## 2015-01-01 DIAGNOSIS — Z96651 Presence of right artificial knee joint: Secondary | ICD-10-CM | POA: Diagnosis not present

## 2015-01-01 MED ORDER — MONTELUKAST SODIUM 10 MG PO TABS: 10.0000 mg | ORAL_TABLET | Freq: Every day | ORAL | Status: DC

## 2015-01-01 NOTE — Assessment & Plan Note (Signed)
Injection as above, x-rays, return in one month.

## 2015-01-01 NOTE — Progress Notes (Signed)
CC: [name] is a 81 y.o. female is here for left knee pain and eyes watering   Subjective: HPI:  Left knee pain that has been present for matter of decades however character has gotten worse over the last week. It's described as a "pain"deep in the knee nonradiating. Worse when walking or weightbearing. Slightly improved with resting or sitting. She's not noticed any swelling or any mechanical symptoms such as locking catching or giving way. She denies any overlying skin changes at the site of pain. She denies any recent over exertion or trauma. Denies lower extremity edema, swelling, or new joint pain elsewhere.  Complains of nasal congestion, watery eyes, postnasal drip that has not improved with nasal steroids or Atrovent. Denies cough, fevers, chills, wheezing, shortness of breath.   Review Of Systems Outlined In HPI  Past Medical History  Diagnosis Date  . Asthma   . Depression   . GERD (gastroesophageal reflux disease)   . Arthritis     osteoarthritis  . Anxiety   . Osteopenia   . Candida esophagitis   . Diverticulosis   . Tubular adenoma of colon   . Osteoarthritis   . DJD (degenerative joint disease)      L5 compression fracture  . Gout   . Renal failure, unspecified   . Fibromyalgia   . C. difficile diarrhea   . Chronic sinusitis   . Panic disorder   . Spinal stenosis of lumbar region   . Hyperlipidemia   . Hypertension   . Edema extremities   . Depression     Past Surgical History  Procedure Laterality Date  . Total hip arthroplasty  2004    right  . Total knee arthroplasty  2006    right  . Carpal tunnel release  2007, 2009    Bilateral  . Shoulder surgery  2004, 07/30/10, 01/2011    after her right humeral neck fracture, revision hemiarthroplasty 2004 for persistent pain, revision reverse arthroplasty December 2011 for persistent pain, incision and drainage with poly-exchange 01/26/2011 for possible prosthetic joint infection.  . Total abdominal  hysterectomy    . Ganglion cyst  wrist    . Ganglion cyct removal on fingers      right  . Tonsillectomy and adenoidectomy    . Nasal sinus surgery    . Total shoulder replacement  2002    right  . Knee arthroscopy      right  . Cataract extraction, bilateral    . Choanal adeniodectomy    . Adenoidectomy     Family History  Problem Relation Age of Onset  . Breast cancer    . Colon polyps    . Cancer Father     bladder  . Diabetes Mother   . Hypertension Mother   . Depression Son     History   Social History  . Marital Status: Married    Spouse Name: N/A  . Number of Children: N/A  . Years of Education: N/A   Occupational History  . Not on file.   Social History Main Topics  . Smoking status: Never Smoker   . Smokeless tobacco: Never Used  . Alcohol Use: No  . Drug Use: No  . Sexual Activity: Not on file   Other Topics Concern  . Not on file   Social History Narrative     Objective: BP 141/78 mmHg  Pulse 91  Wt 163 lb (73.936 kg)  General: Alert and Oriented, No Acute Distress HEENT:  Pupils equal, round, reactive to light. Conjunctivae clear.  External ears unremarkable, canals clear with intact TMs with appropriate landmarks.  Middle ear appears open without effusion. Pink inferior turbinates.  Moist mucous membranes, pharynx without inflammation nor lesions.  Neck supple without palpable lymphadenopathy nor abnormal masses. Lungs: Clear comfortable work of breathing Cardiac: Regular rate and rhythm.  Extremities: No peripheral edema.  Strong peripheral pulses.  Left knee exam shows full-strength and range of motion. There is no swelling, redness, nor warmth overlying the knee.  No patellar crepitus. No patellar apprehension. No pain with palpation of the inferior patellar pole.  No pain or laxity with valgus nor varus stress. Anterior drawer is negative. McMurray's negative.mild popliteal space tenderness but no palpable mass.. Pain is reproduced with  palpation of the medial joint line Mental Status: No depression, anxiety, nor agitation. Skin: Warm and dry.  Assessment & Plan: Malisa was seen today for left knee pain and eyes watering.  Diagnoses and all orders for this visit:  Primary osteoarthritis of left knee  Seasonal allergies Orders: -     montelukast (SINGULAIR) 10 MG tablet; Take 1 tablet (10 mg total) by mouth at bedtime.   Suspect osteoarthritis flare on top of her rheumatoid arthritis affecting the left knee. Given her degenerative changes felt on exam I'm not confident that I can give her a intra-articular steroids injection blindly. I have referred her to my sports medicine colleague for consideration of injection with ultrasound. Seasonal allergies: Start as needed Singulair  Return if symptoms worsen or fail to improve.

## 2015-01-01 NOTE — Progress Notes (Signed)
  Subjective:    CC: Left knee pain  HPI: [name] has left knee osteoarthritis in his posterior right knee arthroplasty, starting to have increasing pain along the posterior medial and posterior lateral joint line without radiation, stiffness in the morning, moderate, persistent.  Past medical history, Surgical history, Family history not pertinant except as noted below, Social history, Allergies, and medications have been entered into the medical record, reviewed, and no changes needed.   Review of Systems: No fevers, chills, night sweats, weight loss, chest pain, or shortness of breath.   Objective:    General: Well Developed, well nourished, and in no acute distress.  Neuro: Alert and oriented x3, extra-ocular muscles intact, sensation grossly intact.  HEENT: Normocephalic, atraumatic, pupils equal round reactive to light, neck supple, no masses, no lymphadenopathy, thyroid nonpalpable.  Skin: Warm and dry, no rashes. Cardiac: Regular rate and rhythm, no murmurs rubs or gallops, no lower extremity edema.  Respiratory: Clear to auscultation bilaterally. Not using accessory muscles, speaking in full sentences. Left Knee: No visible effusion, tender to palpation at the joint lines. ROM normal in flexion and extension and lower leg rotation. Ligaments with solid consistent endpoints including ACL, PCL, LCL, MCL. Negative Mcmurray's and provocative meniscal tests. Non painful patellar compression. Patellar and quadriceps tendons unremarkable. Hamstring and quadriceps strength is normal.  Procedure: Real-time Ultrasound Guided Injection of left knee Device: GE Logiq E  Verbal informed consent obtained.  Time-out conducted.  Noted no overlying erythema, induration, or other signs of local infection.  Skin prepped in a sterile fashion.  Local anesthesia: Topical Ethyl chloride.  With sterile technique and under real time ultrasound guidance:  2 mL kenalog 40, 4 mL lidocaine injected  easily. Completed without difficulty  Pain immediately resolved suggesting accurate placement of the medication.  Advised to call if fevers/chills, erythema, induration, drainage, or persistent bleeding.  Images permanently stored and available for review in the ultrasound unit.  Impression: Technically successful ultrasound guided injection.  Impression and Recommendations:

## 2015-01-06 ENCOUNTER — Ambulatory Visit: Payer: Self-pay | Admitting: Sports Medicine

## 2015-01-09 ENCOUNTER — Ambulatory Visit (HOSPITAL_COMMUNITY)
Admission: RE | Admit: 2015-01-09 | Discharge: 2015-01-09 | Disposition: A | Discharge status: Home / Self Care | Payer: Medicare Other | Source: Ambulatory Visit | Attending: Family Medicine | Admitting: Family Medicine

## 2015-01-09 DIAGNOSIS — M810 Age-related osteoporosis without current pathological fracture: Secondary | ICD-10-CM | POA: Insufficient documentation

## 2015-01-09 MED ORDER — SODIUM CHLORIDE 0.9 % IV SOLN
INTRAVENOUS | Status: AC
  Administered 2015-01-09: 250 mL via INTRAVENOUS

## 2015-01-09 MED ORDER — ZOLEDRONIC ACID 5 MG/100ML IV SOLN
5.0000 mg | Freq: Once | INTRAVENOUS | Status: AC
  Administered 2015-01-09: 5 mg via INTRAVENOUS
  Filled 2015-01-09: qty 100

## 2015-01-09 NOTE — Progress Notes (Signed)
Uneventful infusion of RECLAST

## 2015-01-09 NOTE — Discharge Instructions (Signed)
RECLAST °Zoledronic Acid injection (Paget's Disease, Osteoporosis) °What is this medicine? °ZOLEDRONIC ACID (ZOE le dron ik AS id) lowers the amount of calcium loss from bone. It is used to treat Paget's disease and osteoporosis in women. °This medicine may be used for other purposes; ask your health care provider or pharmacist if you have questions. °COMMON BRAND NAME(S): Reclast, Zometa °What should I tell my health care provider before I take this medicine? °They need to know if you have any of these conditions: °-aspirin-sensitive asthma °-cancer, especially if you are receiving medicines used to treat cancer °-dental disease or wear dentures °-infection °-kidney disease °-low levels of calcium in the blood °-past surgery on the parathyroid gland or intestines °-receiving corticosteroids like dexamethasone or prednisone °-an unusual or allergic reaction to zoledronic acid, other medicines, foods, dyes, or preservatives °-pregnant or trying to get pregnant °-breast-feeding °How should I use this medicine? °This medicine is for infusion into a vein. It is given by a health care professional in a hospital or clinic setting. °Talk to your pediatrician regarding the use of this medicine in children. This medicine is not approved for use in children. °Overdosage: If you think you have taken too much of this medicine contact a poison control center or emergency room at once. °NOTE: This medicine is only for you. Do not share this medicine with others. °What if I miss a dose? °It is important not to miss your dose. Call your doctor or health care professional if you are unable to keep an appointment. °What may interact with this medicine? °-certain antibiotics given by injection °-NSAIDs, medicines for pain and inflammation, like ibuprofen or naproxen °-some diuretics like bumetanide, furosemide °-teriparatide °This list may not describe all possible interactions. Give your health care provider a list of all the  medicines, herbs, non-prescription drugs, or dietary supplements you use. Also tell them if you smoke, drink alcohol, or use illegal drugs. Some items may interact with your medicine. °What should I watch for while using this medicine? °Visit your doctor or health care professional for regular checkups. It may be some time before you see the benefit from this medicine. Do not stop taking your medicine unless your doctor tells you to. Your doctor may order blood tests or other tests to see how you are doing. °Women should inform their doctor if they wish to become pregnant or think they might be pregnant. There is a potential for serious side effects to an unborn child. Talk to your health care professional or pharmacist for more information. °You should make sure that you get enough calcium and vitamin D while you are taking this medicine. Discuss the foods you eat and the vitamins you take with your health care professional. °Some people who take this medicine have severe bone, joint, and/or muscle pain. This medicine may also increase your risk for jaw problems or a broken thigh bone. Tell your doctor right away if you have severe pain in your jaw, bones, joints, or muscles. Tell your doctor if you have any pain that does not go away or that gets worse. °Tell your dentist and dental surgeon that you are taking this medicine. You should not have major dental surgery while on this medicine. See your dentist to have a dental exam and fix any dental problems before starting this medicine. Take good care of your teeth while on this medicine. Make sure you see your dentist for regular follow-up appointments. °What side effects may I notice from receiving this   medicine? °Side effects that you should report to your doctor or health care professional as soon as possible: °-allergic reactions like skin rash, itching or hives, swelling of the face, lips, or tongue °-anxiety, confusion, or depression °-breathing  problems °-changes in vision °-eye pain °-feeling faint or lightheaded, falls °-jaw pain, especially after dental work °-mouth sores °-muscle cramps, stiffness, or weakness °-trouble passing urine or change in the amount of urine °Side effects that usually do not require medical attention (report to your doctor or health care professional if they continue or are bothersome): °-bone, joint, or muscle pain °-constipation °-diarrhea °-fever °-hair loss °-irritation at site where injected °-loss of appetite °-nausea, vomiting °-stomach upset °-trouble sleeping °-trouble swallowing °-weak or tired °This list may not describe all possible side effects. Call your doctor for medical advice about side effects. You may report side effects to FDA at 1-800-FDA-1088. °Where should I keep my medicine? °This drug is given in a hospital or clinic and will not be stored at home. °NOTE: This sheet is a summary. It may not cover all possible information. If you have questions about this medicine, talk to your doctor, pharmacist, or health care provider. °© 2015, [employer]. (2012-12-31 10:03:48) °Osteoporosis °Throughout your life, your body breaks down old bone and replaces it with new bone. As you get older, your body does not replace bone as quickly as it breaks it down. By the age of 30 years, most people begin to gradually lose bone because of the imbalance between bone loss and replacement. Some people lose more bone than others. Bone loss beyond a specified normal degree is considered osteoporosis.  °Osteoporosis affects the strength and durability of your bones. The inside of the ends of your bones and your flat bones, like the bones of your pelvis, look like honeycomb, filled with tiny open spaces. As bone loss occurs, your bones become less dense. This means that the open spaces inside your bones become bigger and the walls between these spaces become thinner. This makes your bones weaker. Bones of a person with  osteoporosis can become so weak that they can break (fracture) during minor accidents, such as a simple fall. °CAUSES  °The following factors have been associated with the development of osteoporosis: °· Smoking. °· Drinking more than 2 alcoholic drinks several days per week. °· Long-term use of certain medicines: °¨ Corticosteroids. °¨ Chemotherapy medicines. °¨ Thyroid medicines. °¨ Antiepileptic medicines. °¨ Gonadal hormone suppression medicine. °¨ Immunosuppression medicine. °· Being underweight. °· Lack of physical activity. °· Lack of exposure to the sun. This can lead to vitamin D deficiency. °· Certain medical conditions: °¨ Certain inflammatory bowel diseases, such as Crohn disease and ulcerative colitis. °¨ Diabetes. °¨ Hyperthyroidism. °¨ Hyperparathyroidism. °RISK FACTORS °Anyone can develop osteoporosis. However, the following factors can increase your risk of developing osteoporosis: °· Gender--Women are at higher risk than men. °· Age--Being older than 50 years increases your risk. °· Ethnicity--White and Asian people have an increased risk. °· Weight --Being extremely underweight can increase your risk of osteoporosis. °· Family history of osteoporosis--Having a family member who has developed osteoporosis can increase your risk. °SYMPTOMS  °Usually, people with osteoporosis have no symptoms.  °DIAGNOSIS  °Signs during a physical exam that may prompt your caregiver to suspect osteoporosis include: °· Decreased height. This is usually caused by the compression of the bones that form your spine (vertebrae) because they have weakened and become fractured. °· A curving or rounding of the upper back (kyphosis). °  To confirm signs of osteoporosis, your caregiver may request a procedure that uses 2 low-dose X-ray beams with different levels of energy to measure your bone mineral density (dual-energy X-ray absorptiometry [DXA]). Also, your caregiver may check your level of vitamin D. °TREATMENT  °The goal of  osteoporosis treatment is to strengthen bones in order to decrease the risk of bone fractures. There are different types of medicines available to help achieve this goal. Some of these medicines work by slowing the processes of bone loss. Some medicines work by increasing bone density. Treatment also involves making sure that your levels of calcium and vitamin D are adequate. °PREVENTION  °There are things you can do to help prevent osteoporosis. Adequate intake of calcium and vitamin D can help you achieve optimal bone mineral density. Regular exercise can also help, especially resistance and weight-bearing activities. If you smoke, quitting smoking is an important part of osteoporosis prevention. °MAKE SURE YOU: °· Understand these instructions. °· Will watch your condition. °· Will get help right away if you are not doing well or get worse. °FOR MORE INFORMATION °www.osteo.org and www.nof.org °Document Released: 04/27/2005 Document Revised: 11/12/2012 Document Reviewed: 07/02/2011 °ExitCare® Patient Information ©2015 [employer]. This information is not intended to replace advice given to you by your health care provider. Make sure you discuss any questions you have with your health care provider. ° ° °

## 2015-01-23 ENCOUNTER — Encounter: Payer: Self-pay | Admitting: Internal Medicine

## 2015-01-29 ENCOUNTER — Encounter: Payer: Self-pay | Admitting: Sports Medicine

## 2015-01-29 ENCOUNTER — Ambulatory Visit (INDEPENDENT_AMBULATORY_CARE_PROVIDER_SITE_OTHER): Payer: Medicare Other | Admitting: Sports Medicine

## 2015-01-29 DIAGNOSIS — M1712 Unilateral primary osteoarthritis, left knee: Secondary | ICD-10-CM

## 2015-01-29 NOTE — Progress Notes (Signed)
  Subjective:    CC: left knee pain  HPI: One month ago I injected this pleasant 81 year old female's knee for primary osteoarthritis, unfortunately she did not have a sufficient response and she is amenable to start Visco supplementation. Pain is moderate, persistent, localized at the joint lines without radiation. No mechanical symptoms. Minimal swelling.  Past medical history, Surgical history, Family history not pertinant except as noted below, Social history, Allergies, and medications have been entered into the medical record, reviewed, and no changes needed.   Review of Systems: No fevers, chills, night sweats, weight loss, chest pain, or shortness of breath.   Objective:    General: Well Developed, well nourished, and in no acute distress.  Neuro: Alert and oriented x3, extra-ocular muscles intact, sensation grossly intact.  HEENT: Normocephalic, atraumatic, pupils equal round reactive to light, neck supple, no masses, no lymphadenopathy, thyroid nonpalpable.  Skin: Warm and dry, no rashes. Cardiac: Regular rate and rhythm, no murmurs rubs or gallops, no lower extremity edema.  Respiratory: Clear to auscultation bilaterally. Not using accessory muscles, speaking in full sentences. Left Knee: Visibly swollen, tender at the joint lines with a palpable fluid wave ROM normal in flexion and extension and lower leg rotation. Ligaments with solid consistent endpoints including ACL, PCL, LCL, MCL. Negative Mcmurray's and provocative meniscal tests. Non painful patellar compression. Patellar and quadriceps tendons unremarkable. Hamstring and quadriceps strength is normal.  Procedure: Real-time Ultrasound Guided Injection of left knee Device: GE Logiq E  Verbal informed consent obtained.  Time-out conducted.  Noted no overlying erythema, induration, or other signs of local infection.  Skin prepped in a sterile fashion.  Local anesthesia: Topical Ethyl chloride.  With sterile  technique and under real time ultrasound guidance:  Aspirated 30 mL of straw-colored fluid, syringe switched and 30 mg/2 mL of OrthoVisc (sodium hyaluronate) in a prefilled syringe was injected easily into the knee through a 22-gauge needle. Completed without difficulty  Pain immediately resolved suggesting accurate placement of the medication.  Advised to call if fevers/chills, erythema, induration, drainage, or persistent bleeding.  Images permanently stored and available for review in the ultrasound unit.  Impression: Technically successful ultrasound guided injection.  Impression and Recommendations:

## 2015-01-29 NOTE — Assessment & Plan Note (Signed)
Failed steroidal injection. Orthovisc No. 1 after aspiration of 20 mL. Return in one week for #2. They are homebound and I would like to set them up with home health physical therapy.

## 2015-02-04 ENCOUNTER — Telehealth: Payer: Self-pay

## 2015-02-04 DIAGNOSIS — I10 Essential (primary) hypertension: Secondary | ICD-10-CM | POA: Diagnosis not present

## 2015-02-04 DIAGNOSIS — M797 Fibromyalgia: Secondary | ICD-10-CM | POA: Diagnosis not present

## 2015-02-04 DIAGNOSIS — K5289 Other specified noninfective gastroenteritis and colitis: Secondary | ICD-10-CM | POA: Diagnosis not present

## 2015-02-04 DIAGNOSIS — Z79891 Long term (current) use of opiate analgesic: Secondary | ICD-10-CM | POA: Diagnosis not present

## 2015-02-04 DIAGNOSIS — M1712 Unilateral primary osteoarthritis, left knee: Secondary | ICD-10-CM | POA: Diagnosis not present

## 2015-02-04 DIAGNOSIS — M810 Age-related osteoporosis without current pathological fracture: Secondary | ICD-10-CM | POA: Diagnosis not present

## 2015-02-04 DIAGNOSIS — J45909 Unspecified asthma, uncomplicated: Secondary | ICD-10-CM | POA: Diagnosis not present

## 2015-02-04 DIAGNOSIS — M4806 Spinal stenosis, lumbar region: Secondary | ICD-10-CM | POA: Diagnosis not present

## 2015-02-04 DIAGNOSIS — Z9181 History of falling: Secondary | ICD-10-CM | POA: Diagnosis not present

## 2015-02-04 DIAGNOSIS — F418 Other specified anxiety disorders: Secondary | ICD-10-CM | POA: Diagnosis not present

## 2015-02-04 DIAGNOSIS — M069 Rheumatoid arthritis, unspecified: Secondary | ICD-10-CM | POA: Diagnosis not present

## 2015-02-04 NOTE — Telephone Encounter (Signed)
June called stated that he did a evaluation for patient today and his plan of care is PT 2 times a week for 4 weeks and it will include  Strenghting, balance training, education with home exercises and safety function mobility. He also stated that he went over patient medication and stated that Amlodipine and Simvastatin interact with their device. He wants to know if you agree with his plan of care. Rhonda Cunningham,CMA

## 2015-02-05 ENCOUNTER — Ambulatory Visit (INDEPENDENT_AMBULATORY_CARE_PROVIDER_SITE_OTHER): Payer: Medicare Other | Admitting: Sports Medicine

## 2015-02-05 ENCOUNTER — Encounter: Payer: Self-pay | Admitting: Sports Medicine

## 2015-02-05 VITALS — BP 127/71 | HR 81 | Wt 162.0 lb

## 2015-02-05 DIAGNOSIS — F331 Major depressive disorder, recurrent, moderate: Secondary | ICD-10-CM | POA: Diagnosis not present

## 2015-02-05 DIAGNOSIS — M1712 Unilateral primary osteoarthritis, left knee: Secondary | ICD-10-CM | POA: Diagnosis not present

## 2015-02-05 DIAGNOSIS — F341 Dysthymic disorder: Secondary | ICD-10-CM | POA: Diagnosis not present

## 2015-02-05 DIAGNOSIS — F410 Panic disorder [episodic paroxysmal anxiety] without agoraphobia: Secondary | ICD-10-CM | POA: Diagnosis not present

## 2015-02-05 NOTE — Assessment & Plan Note (Signed)
OrthoVisc injection number 2 into left knee. Return in 1 week for number 3.

## 2015-02-05 NOTE — Telephone Encounter (Signed)
Yes, the plan of care is okay, also amlodipine and simvastatin are fine together.  That is simply a precaution, not a contraindication considering the mechanism of action, her liver function has been checked and has been fine.

## 2015-02-05 NOTE — Progress Notes (Signed)
  Procedure: Real-time Ultrasound Guided Injection of left knee Device: GE Logiq E  Verbal informed consent obtained.  Time-out conducted.  Noted no overlying erythema, induration, or other signs of local infection.  Skin prepped in a sterile fashion.  Local anesthesia: Topical Ethyl chloride.  With sterile technique and under real time ultrasound guidance:  Aspirated 15 mL straw-colored fluid, syringe switched and  30 mg/2 mL of OrthoVisc (sodium hyaluronate) in a prefilled syringe was injected easily into the knee through a 22-gauge needle. Completed without difficulty  Pain immediately resolved suggesting accurate placement of the medication.  Advised to call if fevers/chills, erythema, induration, drainage, or persistent bleeding.  Images permanently stored and available for review in the ultrasound unit.  Impression: Technically successful ultrasound guided injection.

## 2015-02-05 NOTE — Telephone Encounter (Signed)
Spoke to June from [facility] and gave him instructions as noted below. Zaydin Billey,CMA

## 2015-02-12 ENCOUNTER — Encounter: Payer: Self-pay | Admitting: Sports Medicine

## 2015-02-12 ENCOUNTER — Ambulatory Visit (INDEPENDENT_AMBULATORY_CARE_PROVIDER_SITE_OTHER): Payer: Medicare Other | Admitting: Sports Medicine

## 2015-02-12 VITALS — BP 131/77 | HR 47 | Ht 60.0 in | Wt 161.0 lb

## 2015-02-12 DIAGNOSIS — I10 Essential (primary) hypertension: Secondary | ICD-10-CM | POA: Diagnosis not present

## 2015-02-12 DIAGNOSIS — M4806 Spinal stenosis, lumbar region: Secondary | ICD-10-CM | POA: Diagnosis not present

## 2015-02-12 DIAGNOSIS — M797 Fibromyalgia: Secondary | ICD-10-CM | POA: Diagnosis not present

## 2015-02-12 DIAGNOSIS — M1712 Unilateral primary osteoarthritis, left knee: Secondary | ICD-10-CM | POA: Diagnosis not present

## 2015-02-12 DIAGNOSIS — M069 Rheumatoid arthritis, unspecified: Secondary | ICD-10-CM | POA: Diagnosis not present

## 2015-02-12 DIAGNOSIS — J45909 Unspecified asthma, uncomplicated: Secondary | ICD-10-CM | POA: Diagnosis not present

## 2015-02-12 NOTE — Assessment & Plan Note (Signed)
Orthovisc injection #3 of 4 to the left knee. Return in one week for #4.

## 2015-02-12 NOTE — Progress Notes (Signed)

## 2015-02-13 DIAGNOSIS — M4806 Spinal stenosis, lumbar region: Secondary | ICD-10-CM | POA: Diagnosis not present

## 2015-02-13 DIAGNOSIS — M797 Fibromyalgia: Secondary | ICD-10-CM | POA: Diagnosis not present

## 2015-02-13 DIAGNOSIS — M069 Rheumatoid arthritis, unspecified: Secondary | ICD-10-CM | POA: Diagnosis not present

## 2015-02-13 DIAGNOSIS — J45909 Unspecified asthma, uncomplicated: Secondary | ICD-10-CM | POA: Diagnosis not present

## 2015-02-13 DIAGNOSIS — M1712 Unilateral primary osteoarthritis, left knee: Secondary | ICD-10-CM | POA: Diagnosis not present

## 2015-02-13 DIAGNOSIS — I10 Essential (primary) hypertension: Secondary | ICD-10-CM | POA: Diagnosis not present

## 2015-02-16 DIAGNOSIS — M4806 Spinal stenosis, lumbar region: Secondary | ICD-10-CM | POA: Diagnosis not present

## 2015-02-16 DIAGNOSIS — J45909 Unspecified asthma, uncomplicated: Secondary | ICD-10-CM | POA: Diagnosis not present

## 2015-02-16 DIAGNOSIS — M797 Fibromyalgia: Secondary | ICD-10-CM | POA: Diagnosis not present

## 2015-02-16 DIAGNOSIS — M069 Rheumatoid arthritis, unspecified: Secondary | ICD-10-CM | POA: Diagnosis not present

## 2015-02-16 DIAGNOSIS — I10 Essential (primary) hypertension: Secondary | ICD-10-CM | POA: Diagnosis not present

## 2015-02-16 DIAGNOSIS — M1712 Unilateral primary osteoarthritis, left knee: Secondary | ICD-10-CM | POA: Diagnosis not present

## 2015-02-19 ENCOUNTER — Ambulatory Visit (INDEPENDENT_AMBULATORY_CARE_PROVIDER_SITE_OTHER): Payer: Medicare Other | Admitting: Sports Medicine

## 2015-02-19 ENCOUNTER — Encounter: Payer: Self-pay | Admitting: Sports Medicine

## 2015-02-19 DIAGNOSIS — M1712 Unilateral primary osteoarthritis, left knee: Secondary | ICD-10-CM | POA: Diagnosis not present

## 2015-02-19 DIAGNOSIS — M47816 Spondylosis without myelopathy or radiculopathy, lumbar region: Secondary | ICD-10-CM | POA: Diagnosis not present

## 2015-02-19 DIAGNOSIS — G894 Chronic pain syndrome: Secondary | ICD-10-CM | POA: Diagnosis not present

## 2015-02-19 DIAGNOSIS — Z79891 Long term (current) use of opiate analgesic: Secondary | ICD-10-CM | POA: Diagnosis not present

## 2015-02-19 DIAGNOSIS — M2550 Pain in unspecified joint: Secondary | ICD-10-CM | POA: Diagnosis not present

## 2015-02-19 DIAGNOSIS — M069 Rheumatoid arthritis, unspecified: Secondary | ICD-10-CM | POA: Diagnosis not present

## 2015-02-19 MED ORDER — MELOXICAM 15 MG PO TABS: ORAL_TABLET | ORAL | Status: DC

## 2015-02-19 NOTE — Assessment & Plan Note (Signed)
Injection #4 as above. Adding meloxicam. Return in one month.

## 2015-02-19 NOTE — Progress Notes (Signed)
  Procedure: Real-time Ultrasound Guided Injection of left knee Device: GE Logiq E  Verbal informed consent obtained.  Time-out conducted.  Noted no overlying erythema, induration, or other signs of local infection.  Skin prepped in a sterile fashion.  Local anesthesia: Topical Ethyl chloride.  With sterile technique and under real time ultrasound guidance: Aspirated 11 mL of straw-colored fluid, syringe switched and 30 mg/2 mL of OrthoVisc (sodium hyaluronate) in a prefilled syringe was injected easily into the knee through a 22-gauge needle. Completed without difficulty  Pain immediately resolved suggesting accurate placement of the medication.  Advised to call if fevers/chills, erythema, induration, drainage, or persistent bleeding.  Images permanently stored and available for review in the ultrasound unit.  Impression: Technically successful ultrasound guided injection.

## 2015-02-24 ENCOUNTER — Encounter: Payer: Self-pay | Admitting: Family Medicine

## 2015-02-24 ENCOUNTER — Ambulatory Visit (INDEPENDENT_AMBULATORY_CARE_PROVIDER_SITE_OTHER): Payer: Medicare Other | Admitting: Family Medicine

## 2015-02-24 VITALS — BP 140/82 | HR 99 | Wt 160.0 lb

## 2015-02-24 DIAGNOSIS — Z639 Problem related to primary support group, unspecified: Secondary | ICD-10-CM

## 2015-02-24 DIAGNOSIS — Z638 Other specified problems related to primary support group: Secondary | ICD-10-CM

## 2015-02-24 DIAGNOSIS — J302 Other seasonal allergic rhinitis: Secondary | ICD-10-CM | POA: Insufficient documentation

## 2015-02-24 DIAGNOSIS — I10 Essential (primary) hypertension: Secondary | ICD-10-CM | POA: Diagnosis not present

## 2015-02-24 MED ORDER — AMLODIPINE BESYLATE 10 MG PO TABS: 10.0000 mg | ORAL_TABLET | Freq: Every day | ORAL | Status: DC

## 2015-02-24 NOTE — Progress Notes (Signed)
CC: [name] is a 81 y.o. female is here for Hypertension   Subjective: HPI:  Follow essential hypertension: No outside blood pressures report, at recent visits with sports medicine her temperatures have been normotensive or the prehypertensive range. Denies chest pain shortness of breath orthopnea nor peripheral edema nor constipation.  Her major complaint today is a conflict between her family involving her husband, herself and her daughter. Her daughter seems to choose favorites and spends more time with the patient's husband rather than the patient. This is hurting the patient's feelings were moderate degree causing her to get tearful most days of the week. She denies thoughts of wanting to harm herself or others. She is currently seeing a counselor to try to help with this. Nothing seems to make symptoms better or worse and has been present for decades.   Review Of Systems Outlined In HPI  Past Medical History  Diagnosis Date  . Asthma   . Depression   . GERD (gastroesophageal reflux disease)   . Arthritis     osteoarthritis  . Anxiety   . Osteopenia   . Candida esophagitis   . Diverticulosis   . Tubular adenoma of colon   . Osteoarthritis   . DJD (degenerative joint disease)      L5 compression fracture  . Gout   . Renal failure, unspecified   . Fibromyalgia   . C. difficile diarrhea   . Chronic sinusitis   . Panic disorder   . Spinal stenosis of lumbar region   . Hyperlipidemia   . Hypertension   . Edema extremities   . Depression     Past Surgical History  Procedure Laterality Date  . Total hip arthroplasty  2004    right  . Total knee arthroplasty  2006    right  . Carpal tunnel release  2007, 2009    Bilateral  . Shoulder surgery  2004, 07/30/10, 01/2011    after her right humeral neck fracture, revision hemiarthroplasty 2004 for persistent pain, revision reverse arthroplasty December 2011 for persistent pain, incision and drainage with  poly-exchange 01/26/2011 for possible prosthetic joint infection.  . Total abdominal hysterectomy    . Ganglion cyst  wrist    . Ganglion cyct removal on fingers      right  . Tonsillectomy and adenoidectomy    . Nasal sinus surgery    . Total shoulder replacement  2002    right  . Knee arthroscopy      right  . Cataract extraction, bilateral    . Choanal adeniodectomy    . Adenoidectomy     Family History  Problem Relation Age of Onset  . Breast cancer    . Colon polyps    . Cancer Father     bladder  . Diabetes Mother   . Hypertension Mother   . Depression Son     History   Social History  . Marital Status: Married    Spouse Name: N/A  . Number of Children: N/A  . Years of Education: N/A   Occupational History  . Not on file.   Social History Main Topics  . Smoking status: Never Smoker   . Smokeless tobacco: Never Used  . Alcohol Use: No  . Drug Use: No  . Sexual Activity: Not on file   Other Topics Concern  . Not on file   Social History Narrative     Objective: BP 140/82 mmHg  Pulse 99  Wt 160 lb (72.576  kg)  General: Alert and Oriented, No Acute Distress HEENT: Pupils equal, round, reactive to light. Conjunctivae clear.  Moist mucous membranes Lungs: Clear to auscultation bilaterally, no wheezing/ronchi/rales.  Comfortable work of breathing. Good air movement. Cardiac: Regular rate and rhythm. Normal S1/S2.  No murmurs, rubs, nor gallops.   Abdomen: Normal bowel sounds, soft and non tender without palpable masses. Extremities: No peripheral edema.  Strong peripheral pulses.  Mental Status: Mild depression, occasionally tearful, no anxiety or agitation.  Skin: Warm and dry.  Assessment & Plan: Staley was seen today for hypertension.  Diagnoses and all orders for this visit:  Essential hypertension  Family conflict  Other orders -     amLODipine (NORVASC) 10 MG tablet; Take 1 tablet (10 mg total) by mouth daily.   Essential hypertension:  Controlled continue amlodipine follow-up in 6 months Family conflict: Time was taken to listen to the patient's concerns and I tried my best to provide options that would help the patient integrated herself into hobbies that both her husband and her daughter enjoy in hopes of equaling out quality time between all 3 parties involved. Also encouraged follow-up with her psychiatrist in the near future.  25 minutes spent face-to-face during visit today of which at least 50% was counseling or coordinating care regarding: 1. Essential hypertension   2. Family conflict      Return in about 6 months (around 08/27/2015) for Blood Pressure.

## 2015-02-26 DIAGNOSIS — F331 Major depressive disorder, recurrent, moderate: Secondary | ICD-10-CM | POA: Diagnosis not present

## 2015-02-26 DIAGNOSIS — I10 Essential (primary) hypertension: Secondary | ICD-10-CM | POA: Diagnosis not present

## 2015-02-26 DIAGNOSIS — M797 Fibromyalgia: Secondary | ICD-10-CM | POA: Diagnosis not present

## 2015-02-26 DIAGNOSIS — M1712 Unilateral primary osteoarthritis, left knee: Secondary | ICD-10-CM | POA: Diagnosis not present

## 2015-02-26 DIAGNOSIS — J45909 Unspecified asthma, uncomplicated: Secondary | ICD-10-CM | POA: Diagnosis not present

## 2015-02-26 DIAGNOSIS — M069 Rheumatoid arthritis, unspecified: Secondary | ICD-10-CM | POA: Diagnosis not present

## 2015-02-26 DIAGNOSIS — F410 Panic disorder [episodic paroxysmal anxiety] without agoraphobia: Secondary | ICD-10-CM | POA: Diagnosis not present

## 2015-02-26 DIAGNOSIS — F341 Dysthymic disorder: Secondary | ICD-10-CM | POA: Diagnosis not present

## 2015-02-26 DIAGNOSIS — M4806 Spinal stenosis, lumbar region: Secondary | ICD-10-CM | POA: Diagnosis not present

## 2015-03-03 DIAGNOSIS — M069 Rheumatoid arthritis, unspecified: Secondary | ICD-10-CM | POA: Diagnosis not present

## 2015-03-03 DIAGNOSIS — M1712 Unilateral primary osteoarthritis, left knee: Secondary | ICD-10-CM | POA: Diagnosis not present

## 2015-03-03 DIAGNOSIS — J45909 Unspecified asthma, uncomplicated: Secondary | ICD-10-CM | POA: Diagnosis not present

## 2015-03-03 DIAGNOSIS — I10 Essential (primary) hypertension: Secondary | ICD-10-CM | POA: Diagnosis not present

## 2015-03-03 DIAGNOSIS — M4806 Spinal stenosis, lumbar region: Secondary | ICD-10-CM | POA: Diagnosis not present

## 2015-03-03 DIAGNOSIS — M797 Fibromyalgia: Secondary | ICD-10-CM | POA: Diagnosis not present

## 2015-03-05 DIAGNOSIS — F410 Panic disorder [episodic paroxysmal anxiety] without agoraphobia: Secondary | ICD-10-CM | POA: Diagnosis not present

## 2015-03-05 DIAGNOSIS — F341 Dysthymic disorder: Secondary | ICD-10-CM | POA: Diagnosis not present

## 2015-03-05 DIAGNOSIS — F331 Major depressive disorder, recurrent, moderate: Secondary | ICD-10-CM | POA: Diagnosis not present

## 2015-03-06 DIAGNOSIS — M069 Rheumatoid arthritis, unspecified: Secondary | ICD-10-CM | POA: Diagnosis not present

## 2015-03-06 DIAGNOSIS — M797 Fibromyalgia: Secondary | ICD-10-CM | POA: Diagnosis not present

## 2015-03-06 DIAGNOSIS — M1712 Unilateral primary osteoarthritis, left knee: Secondary | ICD-10-CM | POA: Diagnosis not present

## 2015-03-06 DIAGNOSIS — J45909 Unspecified asthma, uncomplicated: Secondary | ICD-10-CM | POA: Diagnosis not present

## 2015-03-06 DIAGNOSIS — M4806 Spinal stenosis, lumbar region: Secondary | ICD-10-CM | POA: Diagnosis not present

## 2015-03-06 DIAGNOSIS — I10 Essential (primary) hypertension: Secondary | ICD-10-CM | POA: Diagnosis not present

## 2015-03-09 ENCOUNTER — Ambulatory Visit (INDEPENDENT_AMBULATORY_CARE_PROVIDER_SITE_OTHER): Payer: Medicare Other | Admitting: Family Medicine

## 2015-03-09 ENCOUNTER — Telehealth: Payer: Self-pay | Admitting: Family Medicine

## 2015-03-09 ENCOUNTER — Encounter: Payer: Self-pay | Admitting: Family Medicine

## 2015-03-09 VITALS — BP 168/99 | HR 121 | Wt 158.0 lb

## 2015-03-09 DIAGNOSIS — F32A Depression, unspecified: Secondary | ICD-10-CM

## 2015-03-09 DIAGNOSIS — I10 Essential (primary) hypertension: Secondary | ICD-10-CM | POA: Diagnosis not present

## 2015-03-09 DIAGNOSIS — F329 Major depressive disorder, single episode, unspecified: Secondary | ICD-10-CM | POA: Diagnosis not present

## 2015-03-09 MED ORDER — METOPROLOL SUCCINATE ER 25 MG PO TB24: 25.0000 mg | ORAL_TABLET | Freq: Every day | ORAL | Status: DC

## 2015-03-09 NOTE — Progress Notes (Signed)
CC: [name] is a 81 y.o. female is here for Hypertension   Subjective: HPI:  Follow-up essential hypertension: She is noticed that blood pressures at home are ranging between stage I and stage II hypertension. She continues to take amlodipine daily. No change to sodium intake or physical activity changes. She denies chest pain shortness of breath orthopnea nor peripheral edema. She tells me she once had an anaphylactic reaction to Diovan.  Complains that she is tired of taking orders from her current psychiatrist, she Does not like the idea of taking orders from a nurse practitioner, patient believes that she knows more about nursing than her nurse practitioner and this is putting a strain on their relationship. She states that she is getting more and more depressed because oral antidepression medication is either intolerable or ineffective. No thoughts of wanting to harm self or others   Review Of Systems Outlined In HPI  Past Medical History  Diagnosis Date  . Asthma   . Depression   . GERD (gastroesophageal reflux disease)   . Arthritis     osteoarthritis  . Anxiety   . Osteopenia   . Candida esophagitis   . Diverticulosis   . Tubular adenoma of colon   . Osteoarthritis   . DJD (degenerative joint disease)      L5 compression fracture  . Gout   . Renal failure, unspecified   . Fibromyalgia   . C. difficile diarrhea   . Chronic sinusitis   . Panic disorder   . Spinal stenosis of lumbar region   . Hyperlipidemia   . Hypertension   . Edema extremities   . Depression     Past Surgical History  Procedure Laterality Date  . Total hip arthroplasty  2004    right  . Total knee arthroplasty  2006    right  . Carpal tunnel release  2007, 2009    Bilateral  . Shoulder surgery  2004, 07/30/10, 01/2011    after her right humeral neck fracture, revision hemiarthroplasty 2004 for persistent pain, revision reverse arthroplasty December 2011 for persistent pain, incision  and drainage with poly-exchange 01/26/2011 for possible prosthetic joint infection.  . Total abdominal hysterectomy    . Ganglion cyst  wrist    . Ganglion cyct removal on fingers      right  . Tonsillectomy and adenoidectomy    . Nasal sinus surgery    . Total shoulder replacement  2002    right  . Knee arthroscopy      right  . Cataract extraction, bilateral    . Choanal adeniodectomy    . Adenoidectomy     Family History  Problem Relation Age of Onset  . Breast cancer    . Colon polyps    . Cancer Father     bladder  . Diabetes Mother   . Hypertension Mother   . Depression Son     History   Social History  . Marital Status: Married    Spouse Name: N/A  . Number of Children: N/A  . Years of Education: N/A   Occupational History  . Not on file.   Social History Main Topics  . Smoking status: Never Smoker   . Smokeless tobacco: Never Used  . Alcohol Use: No  . Drug Use: No  . Sexual Activity: Not on file   Other Topics Concern  . Not on file   Social History Narrative     Objective: BP 168/99 mmHg  Pulse  121  Wt 158 lb (71.668 kg)  General: Alert and Oriented, No Acute Distress HEENT: Pupils equal, round, reactive to light. Conjunctivae clear.  Moist mucous membranes Lungs: Clear to auscultation bilaterally, no wheezing/ronchi/rales.  Comfortable work of breathing. Good air movement. Cardiac: Regular rate and rhythm. Normal S1/S2.  No murmurs, rubs, nor gallops.   Extremities: No peripheral edema.  Strong peripheral pulses.  Mental Status: moderately depressed. Noanxiety, nor agitation. Skin: Warm and dry.  Assessment & Plan: [name] was seen today for hypertension.  Diagnoses and all orders for this visit:  Essential hypertension Orders: -     TSH -     COMPLETE METABOLIC PANEL WITH GFR -     CBC  Depression Orders: -     Ambulatory referral to Psychiatry  Other orders -     metoprolol succinate (TOPROL-XL) 25 MG 24 hr tablet; Take 1  tablet (25 mg total) by mouth daily.  essential hypertension: uncontrolledchronic condition, continue amlodipine start metoprolol  depression: Uncontrolled, referral has been placed to a new office urine [facility] so that she does not have to see a nurse practitioner, this is entirely her own preference not necessarily mine.  25 minutes spent face-to-face during visit today of which at least 50% was counseling or coordinating care regarding: 1. Essential hypertension   2. Depression        Return in about 4 weeks (around 04/06/2015) for Blood Pressure Follow Up.

## 2015-03-09 NOTE — Telephone Encounter (Signed)
Seth Bake, Will you please call Shirley Friar' office at [phone] and ask what way is the best way for this patient to contact [facility].  Patient needs to let her know that generic lexapro is not working and has not been able to navigate the phone prompts.  Will you then update the patient please.

## 2015-03-10 ENCOUNTER — Encounter: Payer: Self-pay | Admitting: Family Medicine

## 2015-03-10 DIAGNOSIS — R7989 Other specified abnormal findings of blood chemistry: Secondary | ICD-10-CM | POA: Insufficient documentation

## 2015-03-10 DIAGNOSIS — R945 Abnormal results of liver function studies: Secondary | ICD-10-CM

## 2015-03-10 LAB — COMPLETE METABOLIC PANEL WITH GFR
ALBUMIN: 4 g/dL (ref 3.6–5.1)
ALT: 43 U/L — ABNORMAL HIGH (ref 6–29)
AST: 86 U/L — ABNORMAL HIGH (ref 10–35)
Alkaline Phosphatase: 86 U/L (ref 33–130)
BUN: 8 mg/dL (ref 7–25)
CO2: 30 mmol/L (ref 20–31)
Calcium: 9.7 mg/dL (ref 8.6–10.4)
Chloride: 99 mmol/L (ref 98–110)
Creat: 0.83 mg/dL (ref 0.60–0.93)
GFR, EST NON AFRICAN AMERICAN: 67 mL/min (ref >=60)
GFR, Est African American: 78 mL/min (ref >=60)
GLUCOSE: 106 mg/dL — AB (ref 65–99)
Potassium: 3.9 mmol/L (ref 3.5–5.3)
SODIUM: 141 mmol/L (ref 135–146)
TOTAL PROTEIN: 6.9 g/dL (ref 6.1–8.1)
Total Bilirubin: 0.6 mg/dL (ref 0.2–1.2)

## 2015-03-10 LAB — CBC
HCT: 44.8 % (ref 36.0–46.0)
Hemoglobin: 14.7 g/dL (ref 12.0–15.0)
MCH: 31.5 pg (ref 26.0–34.0)
MCHC: 32.8 g/dL (ref 30.0–36.0)
MCV: 96.1 fL (ref 78.0–100.0)
MPV: 11 fL (ref 8.6–12.4)
Platelets: 257 10*3/uL (ref 150–400)
RBC: 4.66 MIL/uL (ref 3.87–5.11)
RDW: 14.6 % (ref 11.5–15.5)
WBC: 7 10*3/uL (ref 4.0–10.5)

## 2015-03-10 LAB — TSH: TSH: 2.639 u[IU]/mL (ref 0.350–4.500)

## 2015-03-10 NOTE — Telephone Encounter (Signed)
Called and they are at lunch until 2

## 2015-03-10 NOTE — Telephone Encounter (Signed)
Called the office and the prompts state for medication questions to call pharm coordinators at a specific #. Called the number and left a message that pt is concerned her lexapro is not working and to call the patient back.

## 2015-03-16 ENCOUNTER — Other Ambulatory Visit: Payer: Self-pay | Admitting: Family Medicine

## 2015-03-16 MED ORDER — OMEPRAZOLE 20 MG PO CPDR: 20.0000 mg | DELAYED_RELEASE_CAPSULE | Freq: Every day | ORAL | Status: DC

## 2015-03-19 DIAGNOSIS — F341 Dysthymic disorder: Secondary | ICD-10-CM | POA: Diagnosis not present

## 2015-03-19 DIAGNOSIS — F331 Major depressive disorder, recurrent, moderate: Secondary | ICD-10-CM | POA: Diagnosis not present

## 2015-03-19 DIAGNOSIS — F410 Panic disorder [episodic paroxysmal anxiety] without agoraphobia: Secondary | ICD-10-CM | POA: Diagnosis not present

## 2015-03-23 ENCOUNTER — Encounter: Payer: Self-pay | Admitting: Family Medicine

## 2015-03-23 ENCOUNTER — Encounter: Payer: Self-pay | Admitting: Sports Medicine

## 2015-03-23 ENCOUNTER — Ambulatory Visit (INDEPENDENT_AMBULATORY_CARE_PROVIDER_SITE_OTHER): Payer: Medicare Other | Admitting: Family Medicine

## 2015-03-23 ENCOUNTER — Ambulatory Visit (INDEPENDENT_AMBULATORY_CARE_PROVIDER_SITE_OTHER): Payer: Medicare Other | Admitting: Sports Medicine

## 2015-03-23 VITALS — BP 142/86 | HR 106 | Wt 159.0 lb

## 2015-03-23 DIAGNOSIS — M1712 Unilateral primary osteoarthritis, left knee: Secondary | ICD-10-CM

## 2015-03-23 DIAGNOSIS — G894 Chronic pain syndrome: Secondary | ICD-10-CM | POA: Diagnosis not present

## 2015-03-23 DIAGNOSIS — F419 Anxiety disorder, unspecified: Secondary | ICD-10-CM

## 2015-03-23 DIAGNOSIS — M069 Rheumatoid arthritis, unspecified: Secondary | ICD-10-CM | POA: Diagnosis not present

## 2015-03-23 DIAGNOSIS — F329 Major depressive disorder, single episode, unspecified: Secondary | ICD-10-CM

## 2015-03-23 DIAGNOSIS — F418 Other specified anxiety disorders: Secondary | ICD-10-CM | POA: Diagnosis not present

## 2015-03-23 DIAGNOSIS — M47816 Spondylosis without myelopathy or radiculopathy, lumbar region: Secondary | ICD-10-CM | POA: Diagnosis not present

## 2015-03-23 DIAGNOSIS — I10 Essential (primary) hypertension: Secondary | ICD-10-CM | POA: Diagnosis not present

## 2015-03-23 DIAGNOSIS — M2550 Pain in unspecified joint: Secondary | ICD-10-CM | POA: Diagnosis not present

## 2015-03-23 MED ORDER — METOPROLOL SUCCINATE ER 50 MG PO TB24: 50.0000 mg | ORAL_TABLET | Freq: Every day | ORAL | Status: DC

## 2015-03-23 MED ORDER — MELOXICAM 15 MG PO TABS: ORAL_TABLET | ORAL | Status: DC

## 2015-03-23 NOTE — Assessment & Plan Note (Signed)
Pain has nearly resolved after a full series of Orthovisc.  We can repeat this in 4 months if pain recurs, she has not yet taken her meloxicam.

## 2015-03-23 NOTE — Progress Notes (Signed)
  Subjective:    CC: follow-up  HPI: Left knee osteoarthritis: Pain is essentially resolved now after series of Orthovisc.  Past medical history, Surgical history, Family history not pertinant except as noted below, Social history, Allergies, and medications have been entered into the medical record, reviewed, and no changes needed.   Review of Systems: No fevers, chills, night sweats, weight loss, chest pain, or shortness of breath.   Objective:    General: Well Developed, well nourished, and in no acute distress.  Neuro: Alert and oriented x3, extra-ocular muscles intact, sensation grossly intact.  HEENT: Normocephalic, atraumatic, pupils equal round reactive to light, neck supple, no masses, no lymphadenopathy, thyroid nonpalpable.  Skin: Warm and dry, no rashes. Cardiac: Regular rate and rhythm, no murmurs rubs or gallops, no lower extremity edema.  Respiratory: Clear to auscultation bilaterally. Not using accessory muscles, speaking in full sentences. Left Knee: Normal to inspection with no erythema or effusion or obvious bony abnormalities. Palpation normal with no warmth or joint line tenderness or patellar tenderness or condyle tenderness. ROM normal in flexion and extension and lower leg rotation. Ligaments with solid consistent endpoints including ACL, PCL, LCL, MCL. Negative Mcmurray's and provocative meniscal tests. Non painful patellar compression. Patellar and quadriceps tendons unremarkable. Hamstring and quadriceps strength is normal.  Impression and Recommendations:

## 2015-03-23 NOTE — Progress Notes (Signed)
CC: [name] is a 81 y.o. female is here for Hypertension   Subjective: HPI:  Follow-up essential hypertension: Since starting metoprolol she denies any known side effects. She brings in blood pressures from home showing 90% of these readings being in the stage I hypertension range, 10% in the prehypertensive range. She denies chest pain shortness of breath orthopnea nor peripheral edema.  Follow-up depression: She would like to talk about how she has been sad in her relationship with her husband over the last 30 years. She is also upset about her current living situation. She's been offered a bed with her cousin at this individual's residence however the patient has declined to leave her husband. She still sees her psychiatrist Shirley Friar. Continues on Lexapro daily.   Review Of Systems Outlined In HPI  Past Medical History  Diagnosis Date  . Asthma   . Depression   . GERD (gastroesophageal reflux disease)   . Arthritis     osteoarthritis  . Anxiety   . Osteopenia   . Candida esophagitis   . Diverticulosis   . Tubular adenoma of colon   . Osteoarthritis   . DJD (degenerative joint disease)      L5 compression fracture  . Gout   . Renal failure, unspecified   . Fibromyalgia   . C. difficile diarrhea   . Chronic sinusitis   . Panic disorder   . Spinal stenosis of lumbar region   . Hyperlipidemia   . Hypertension   . Edema extremities   . Depression     Past Surgical History  Procedure Laterality Date  . Total hip arthroplasty  2004    right  . Total knee arthroplasty  2006    right  . Carpal tunnel release  2007, 2009    Bilateral  . Shoulder surgery  2004, 07/30/10, 01/2011    after her right humeral neck fracture, revision hemiarthroplasty 2004 for persistent pain, revision reverse arthroplasty December 2011 for persistent pain, incision and drainage with poly-exchange 01/26/2011 for possible prosthetic joint infection.  . Total abdominal hysterectomy    .  Ganglion cyst  wrist    . Ganglion cyct removal on fingers      right  . Tonsillectomy and adenoidectomy    . Nasal sinus surgery    . Total shoulder replacement  2002    right  . Knee arthroscopy      right  . Cataract extraction, bilateral    . Choanal adeniodectomy    . Adenoidectomy     Family History  Problem Relation Age of Onset  . Breast cancer    . Colon polyps    . Cancer Father     bladder  . Diabetes Mother   . Hypertension Mother   . Depression Son     Social History   Social History  . Marital Status: Married    Spouse Name: N/A  . Number of Children: N/A  . Years of Education: N/A   Occupational History  . Not on file.   Social History Main Topics  . Smoking status: Never Smoker   . Smokeless tobacco: Never Used  . Alcohol Use: No  . Drug Use: No  . Sexual Activity: Not on file   Other Topics Concern  . Not on file   Social History Narrative     Objective: BP 142/86 mmHg  Pulse 106  Wt 159 lb (72.122 kg)  Vital signs reviewed. General: Alert and Oriented, No Acute Distress  HEENT: Pupils equal, round, reactive to light. Conjunctivae clear.  External ears unremarkable.  Moist mucous membranes. Lungs: Clear and comfortable work of breathing, speaking in full sentences without accessory muscle use. Cardiac: Regular rate and rhythm.  Neuro: CN II-XII grossly intact, gait normal. Extremities: No peripheral edema.  Strong peripheral pulses.  Mental Status: No depression, anxiety, nor agitation. Logical though process. Skin: Warm and dry.  Assessment & Plan: Jaloni was seen today for hypertension.  Diagnoses and all orders for this visit:  Essential hypertension  Anxiety and depression  Other orders -     metoprolol succinate (TOPROL-XL) 50 MG 24 hr tablet; Take 1-2 tablets (50-100 mg total) by mouth daily. Titrate at home for a goal of blood pressure less than 140/90.   Essential hypertension: Uncontrolled chronic condition titrate  up on metoprolol as tolerated for a goal of blood pressure less than 140/90. I've asked her to drop off a blood pressure log over the next week. Anxiety depression: Uncontrolled depression, I've encouraged her to visit with her psychiatrist as soon as possible to discuss his chronic conditions.  25 minutes spent face-to-face during visit today of which at least 50% was counseling or coordinating care regarding: 1. Essential hypertension   2. Anxiety and depression      Return in about 4 weeks (around 04/20/2015).

## 2015-03-24 DIAGNOSIS — F331 Major depressive disorder, recurrent, moderate: Secondary | ICD-10-CM | POA: Diagnosis not present

## 2015-03-24 DIAGNOSIS — F410 Panic disorder [episodic paroxysmal anxiety] without agoraphobia: Secondary | ICD-10-CM | POA: Diagnosis not present

## 2015-03-31 ENCOUNTER — Telehealth: Payer: Self-pay | Admitting: Family Medicine

## 2015-03-31 MED ORDER — METOPROLOL SUCCINATE ER 100 MG PO TB24: 100.0000 mg | ORAL_TABLET | Freq: Every day | ORAL | Status: DC

## 2015-03-31 NOTE — Telephone Encounter (Signed)
Seth Bake, Will you please let patient know that her BP still remains elevated and uncontrolled.  I'd recommend she start taking a 100mg  formulation of metoprolol to replace the 50mg  formulation. Please drop off daily BP values in one week.

## 2015-03-31 NOTE — Telephone Encounter (Signed)
Pt.notified

## 2015-04-07 ENCOUNTER — Ambulatory Visit (INDEPENDENT_AMBULATORY_CARE_PROVIDER_SITE_OTHER): Payer: Medicare Other | Admitting: Family Medicine

## 2015-04-07 ENCOUNTER — Encounter: Payer: Self-pay | Admitting: Family Medicine

## 2015-04-07 VITALS — BP 154/65 | HR 64 | Wt 161.0 lb

## 2015-04-07 DIAGNOSIS — F329 Major depressive disorder, single episode, unspecified: Secondary | ICD-10-CM

## 2015-04-07 DIAGNOSIS — I10 Essential (primary) hypertension: Secondary | ICD-10-CM | POA: Diagnosis not present

## 2015-04-07 DIAGNOSIS — F32A Depression, unspecified: Secondary | ICD-10-CM

## 2015-04-07 MED ORDER — ATENOLOL-CHLORTHALIDONE 50-25 MG PO TABS: 1.0000 | ORAL_TABLET | Freq: Every day | ORAL | Status: DC

## 2015-04-07 NOTE — Progress Notes (Signed)
CC: [name] is a 81 y.o. female is here for Hypertension   Subjective: HPI:  Follow-up essential hypertension: She is taking amlodipine and an increased dose of metoprolol since I saw her last. At 100 mg or 50 or 25 mg blood pressures have remained in the stage I and stage II hypertension range. She denies any known side effects from this medication. She denies chest pain shortness of breath orthopnea nor peripheral edema. Home blood pressures are similar to begin our office.  Should like to talk about her depression and how it seems to be stemming from she and her husband not getting along with respect to hobbies and interest in staying active. She would like to stay stationary and sedentary, he likes to stay active helping out with family. She tells me that she has crying episodes every day. She sees a Social worker and a separate psychiatrist. She is currently not on any medication for depression. No thoughts one half self or others.   Review Of Systems Outlined In HPI  Past Medical History  Diagnosis Date  . Asthma   . Depression   . GERD (gastroesophageal reflux disease)   . Arthritis     osteoarthritis  . Anxiety   . Osteopenia   . Candida esophagitis   . Diverticulosis   . Tubular adenoma of colon   . Osteoarthritis   . DJD (degenerative joint disease)      L5 compression fracture  . Gout   . Renal failure, unspecified   . Fibromyalgia   . C. difficile diarrhea   . Chronic sinusitis   . Panic disorder   . Spinal stenosis of lumbar region   . Hyperlipidemia   . Hypertension   . Edema extremities   . Depression     Past Surgical History  Procedure Laterality Date  . Total hip arthroplasty  2004    right  . Total knee arthroplasty  2006    right  . Carpal tunnel release  2007, 2009    Bilateral  . Shoulder surgery  2004, 07/30/10, 01/2011    after her right humeral neck fracture, revision hemiarthroplasty 2004 for persistent pain, revision reverse arthroplasty  December 2011 for persistent pain, incision and drainage with poly-exchange 01/26/2011 for possible prosthetic joint infection.  . Total abdominal hysterectomy    . Ganglion cyst  wrist    . Ganglion cyct removal on fingers      right  . Tonsillectomy and adenoidectomy    . Nasal sinus surgery    . Total shoulder replacement  2002    right  . Knee arthroscopy      right  . Cataract extraction, bilateral    . Choanal adeniodectomy    . Adenoidectomy     Family History  Problem Relation Age of Onset  . Breast cancer    . Colon polyps    . Cancer Father     bladder  . Diabetes Mother   . Hypertension Mother   . Depression Son     Social History   Social History  . Marital Status: Married    Spouse Name: N/A  . Number of Children: N/A  . Years of Education: N/A   Occupational History  . Not on file.   Social History Main Topics  . Smoking status: Never Smoker   . Smokeless tobacco: Never Used  . Alcohol Use: No  . Drug Use: No  . Sexual Activity: Not on file   Other Topics Concern  .  Not on file   Social History Narrative     Objective: BP 154/65 mmHg  Pulse 64  Wt 161 lb (73.029 kg)  SpO2 94%  Vital signs reviewed. General: Alert and Oriented, No Acute Distress HEENT: Pupils equal, round, reactive to light. Conjunctivae clear.  External ears unremarkable.  Moist mucous membranes. Lungs: Clear and comfortable work of breathing, speaking in full sentences without accessory muscle use. Cardiac: Regular rate and rhythm.  Neuro: CN II-XII grossly intact, gait normal. Extremities: No peripheral edema.  Strong peripheral pulses.  Mental Status: Mild depression. No anxiety, nor agitation. Logical though process. Skin: Warm and dry.  Assessment & Plan: [name] was seen today for hypertension.  Diagnoses and all orders for this visit:  Depression  Essential hypertension  Other orders -     atenolol-chlorthalidone (TENORETIC) 50-25 MG per tablet; Take 1  tablet by mouth daily.   Depression: Encouraged follow-up with her psychiatrist, discussed that she really should be on something to help with her depression. Continue counseling. Essential hypertension: Uncontrolled chronic conditions stop metoprolol continue amlodipine and starting atenolol-chlorthalidone  Return in about 4 weeks (around 05/05/2015).

## 2015-04-09 DIAGNOSIS — F410 Panic disorder [episodic paroxysmal anxiety] without agoraphobia: Secondary | ICD-10-CM | POA: Diagnosis not present

## 2015-04-09 DIAGNOSIS — F331 Major depressive disorder, recurrent, moderate: Secondary | ICD-10-CM | POA: Diagnosis not present

## 2015-04-09 DIAGNOSIS — F341 Dysthymic disorder: Secondary | ICD-10-CM | POA: Diagnosis not present

## 2015-04-14 DIAGNOSIS — R740 Nonspecific elevation of levels of transaminase and lactic acid dehydrogenase [LDH]: Secondary | ICD-10-CM | POA: Diagnosis not present

## 2015-04-14 DIAGNOSIS — M069 Rheumatoid arthritis, unspecified: Secondary | ICD-10-CM | POA: Diagnosis not present

## 2015-04-15 ENCOUNTER — Telehealth: Payer: Self-pay

## 2015-04-15 NOTE — Telephone Encounter (Signed)
PATIENT CALLED WITH CONCERNS ABOUT HER PULSE RATE BEING ON THE NEW MEDICATION  ATENOLOL-CHLORTHALIDONE SHE STATED THAT IT HAS BEEN IN THE 18s.AND PRIOR TO TAKING THE MEDICATION PULSE WAS IN THE 23s.SHE WANTED TO KNOW IS THERE A RISK IF IT GOES BELOW 50  PLEASE ADVISE PATIENT IF SHE SHOULD BE WORRIED ABOUT ANYTHING. [name],CMA

## 2015-04-15 NOTE — Telephone Encounter (Signed)
50s is ok as long as she's not getting lightheaded, shortness of breath, or chest pain while on this medication.

## 2015-04-15 NOTE — Telephone Encounter (Signed)
Spoke to patient gave her information as noted below. Rhonda Cunningham,CMA  

## 2015-04-20 ENCOUNTER — Ambulatory Visit: Payer: Self-pay | Admitting: Cardiology

## 2015-04-20 ENCOUNTER — Ambulatory Visit (INDEPENDENT_AMBULATORY_CARE_PROVIDER_SITE_OTHER): Payer: Medicare Other | Admitting: Cardiology

## 2015-04-20 ENCOUNTER — Encounter: Payer: Self-pay | Admitting: Cardiology

## 2015-04-20 VITALS — BP 132/58 | HR 49 | Ht 60.0 in | Wt 158.8 lb

## 2015-04-20 DIAGNOSIS — R531 Weakness: Secondary | ICD-10-CM

## 2015-04-20 DIAGNOSIS — R001 Bradycardia, unspecified: Secondary | ICD-10-CM

## 2015-04-20 DIAGNOSIS — I10 Essential (primary) hypertension: Secondary | ICD-10-CM | POA: Diagnosis not present

## 2015-04-20 MED ORDER — HYDROCHLOROTHIAZIDE 25 MG PO TABS: 25.0000 mg | ORAL_TABLET | Freq: Every day | ORAL | Status: DC

## 2015-04-20 NOTE — Progress Notes (Signed)
Cardiology Office Note   Date:  04/20/2015   ID:  [name], DOB 06-11-1933, MRN [id number]  PCP:  Marcial Pacas, DO    Chief Complaint  Patient presents with  . Hypertension      History of Present Illness: [name] is a 81 y.o. female with a history of HTN and asymptomatic bradycardia who presents today for followup. She has a history of bradycardia and her beta blocker had to be stopped but then was restarted by her PCP.  Her PCP placed her on Toprol initially and then changed it to Tenoretic. She says that her heart rate has been all over the place. She denies any chest pain, SOB, DOE, LE edema,  palpitations or syncope.  Since going on the tenoretic she has been very nervous.  She is very weak and tired since going back on tenoretic.    Past Medical History  Diagnosis Date  . Asthma   . Depression   . GERD (gastroesophageal reflux disease)   . Arthritis     osteoarthritis  . Anxiety   . Osteopenia   . Candida esophagitis   . Diverticulosis   . Tubular adenoma of colon   . Osteoarthritis   . DJD (degenerative joint disease)      L5 compression fracture  . Gout   . Renal failure, unspecified   . Fibromyalgia   . C. difficile diarrhea   . Chronic sinusitis   . Panic disorder   . Spinal stenosis of lumbar region   . Hyperlipidemia   . Hypertension   . Edema extremities   . Depression     Past Surgical History  Procedure Laterality Date  . Total hip arthroplasty  2004    right  . Total knee arthroplasty  2006    right  . Carpal tunnel release  2007, 2009    Bilateral  . Shoulder surgery  2004, 07/30/10, 01/2011    after her right humeral neck fracture, revision hemiarthroplasty 2004 for persistent pain, revision reverse arthroplasty December 2011 for persistent pain, incision and drainage with poly-exchange 01/26/2011 for possible prosthetic joint infection.  . Total abdominal hysterectomy    . Ganglion cyst  wrist    .  Ganglion cyct removal on fingers      right  . Tonsillectomy and adenoidectomy    . Nasal sinus surgery    . Total shoulder replacement  2002    right  . Knee arthroscopy      right  . Cataract extraction, bilateral    . Choanal adeniodectomy    . Adenoidectomy       Current Outpatient Prescriptions  Medication Sig Dispense Refill  . ALPRAZolam (XANAX) 0.5 MG tablet Take 0.5 mg by mouth. For anxiety;one tablet daily    . amLODipine (NORVASC) 10 MG tablet Take 1 tablet (10 mg total) by mouth daily. 90 tablet 2  . atenolol-chlorthalidone (TENORETIC) 50-25 MG per tablet Take 1 tablet by mouth daily. 30 tablet 3  . Calcium Carbonate-Vitamin D (CALCIUM 600+D) 600-400 MG-UNIT per tablet Take 1 tablet by mouth 2 (two) times daily.    . diphenhydrAMINE (BENADRYL) 25 MG tablet Take 25 mg by mouth every 6 (six) hours as needed. For allergies    . EPINEPHrine (EPI-PEN) 0.3 mg/0.3 mL DEVI Inject 0.3 mg into the muscle as needed (anaphylaxis).     [name] escitalopram (LEXAPRO) 20 MG tablet  Take 20 mg by mouth daily.    . fentaNYL (DURAGESIC - DOSED MCG/HR) 25 MCG/HR Place 1 patch onto the skin every 3 (three) days.    . fluticasone (FLONASE) 50 MCG/ACT nasal spray Place 2 sprays into both nostrils daily as needed for allergies or rhinitis.    [name] ipratropium (ATROVENT) 0.06 % nasal spray Place 2 sprays into both nostrils 3 (three) times daily as needed for rhinitis. 15 mL 12  . montelukast (SINGULAIR) 10 MG tablet Take 1 tablet (10 mg total) by mouth at bedtime. 30 tablet 3  . omeprazole (PRILOSEC) 20 MG capsule Take 1 capsule (20 mg total) by mouth daily. 90 capsule 1  . oxyCODONE (OXY IR/ROXICODONE) 5 MG immediate release tablet Take 5 mg by mouth 3 (three) times daily as needed (pain).     . simvastatin (ZOCOR) 20 MG tablet Take 20 mg by mouth daily.    . zoledronic acid (RECLAST) 5 MG/100ML SOLN Inject 5 mg into the vein. Once yearly. In June    . [DISCONTINUED] venlafaxine XR (EFFEXOR XR) 75 MG 24 hr  capsule Take 75 mg by mouth daily with breakfast.     No current facility-administered medications for this visit.    Allergies:   Bee venom; Ceftin; Ciprofloxacin; Ephedrine; Sulfonamide derivatives; Telithromycin; Valsartan; Verapamil; Venlafaxine; Cephalosporins; Clindamycin/lincomycin; Cymbalta; Gabapentin; Lamotrigine; Mirtazapine; Morphine; Oxycodone-acetaminophen; Pregabalin; Prochlorperazine edisylate; Pseudoephedrine; Relafen; Zoloft; and Doxycycline    Social History:  The patient  reports that she has never smoked. She has never used smokeless tobacco. She reports that she does not drink alcohol or use illicit drugs.   Family History:  The patient's family history includes Breast cancer in an other family member; Cancer in her father; Colon polyps in an other family member; Depression in her son; Diabetes in her mother; Hypertension in her mother.    ROS:  Please see the history of present illness.   Otherwise, review of systems are positive for none.   All other systems are reviewed and negative.    PHYSICAL EXAM: VS:  BP 132/58 mmHg  Pulse 49  Ht 5' (1.524 m)  Wt 158 lb 12.8 oz (72.031 kg)  BMI 31.01 kg/m2 , BMI Body mass index is 31.01 kg/(m^2). GEN: Well nourished, well developed, in no acute distress HEENT: normal Neck: no JVD, carotid bruits, or masses Cardiac: RRR; no murmurs, rubs, or gallops,no edema  Respiratory:  clear to auscultation bilaterally, normal work of breathing GI: soft, nontender, nondistended, + BS MS: no deformity or atrophy Skin: warm and dry, no rash Neuro:  Strength and sensation are intact Psych: euthymic mood, full affect   EKG:  EKG was ordered today and showed sinus bradycardia at 49bpm with nonspecific ST abnormality    Recent Labs: 03/09/2015: ALT 43*; BUN 8; Creat 0.83; Hemoglobin 14.7; Platelets 257; Potassium 3.9; Sodium 141; TSH 2.639    Lipid Panel    Component Value Date/Time   CHOL 168 09/04/2014 0917   TRIG 148  09/04/2014 0917   HDL 42 09/04/2014 0917   CHOLHDL 4.0 09/04/2014 0917   VLDL 30 09/04/2014 0917   LDLCALC 96 09/04/2014 0917      Wt Readings from Last 3 Encounters:  04/20/15 158 lb 12.8 oz (72.031 kg)  04/07/15 161 lb (73.029 kg)  03/23/15 159 lb (72.122 kg)    ASSESSMENT AND PLAN:  1. Asymptomatic bradycardia - her PCP put her back on BB for BP control but is now bradycardic again and is weak, tired and anxious.  I will stop her Tenoretic.   2. HTN - well controlled - continue amlodipine  - d/c ternoretic due to bradycardia and continue HCTZ 25mg  daily.   - check BMET in 1 week - I have asked her to check her BP and HR for 1 week starting Friday and call with the results       3.  Weakness secondary to #2 - stop BB and continue diuretic  F/U with extender in 2 weeks to make sure HR has improved    Current medicines are reviewed at length with the patient today.  The patient does not have concerns regarding medicines.  The following changes have been made:  no change  Labs/ tests ordered today: See above Assessment and Plan No orders of the defined types were placed in this encounter.     Disposition:   FU with me in 6 months  Signed, Sueanne Margarita, MD  04/20/2015 4:07 PM    [facility] [address], [place], [place]  [address] Phone: [phone]; Fax: [phone]

## 2015-04-20 NOTE — Patient Instructions (Addendum)
Medication Instructions:  Your physician has recommended you make the following change in your medication:  1) DECREASE your TENORETIC to 1/2 tablet daily for 2 days then STOP the medication. 2) Once you have stopped the Tenoretic, START HCTZ 25 mg daily  Labwork: Your physician recommends that you return for lab work on 04/29/15. You DO NOT need to be fasting. You may come ANY TIME between 7:30 AM and 5:00 PM.  Testing/Procedures: None  Follow-Up: Your physician recommends that you schedule a follow-up appointment on 05/06/2015 at 2:20 PM with Richardson Dopp, PA.  Your physician wants you to follow-up in: 6 months with Dr. Radford Pax. You will receive a reminder letter in the mail two months in advance. If you don't receive a letter, please call our office to schedule the follow-up appointment.   Any Other Special Instructions Will Be Listed Below (If Applicable).

## 2015-04-21 DIAGNOSIS — M2550 Pain in unspecified joint: Secondary | ICD-10-CM | POA: Diagnosis not present

## 2015-04-21 DIAGNOSIS — M069 Rheumatoid arthritis, unspecified: Secondary | ICD-10-CM | POA: Diagnosis not present

## 2015-04-21 DIAGNOSIS — G894 Chronic pain syndrome: Secondary | ICD-10-CM | POA: Diagnosis not present

## 2015-04-21 DIAGNOSIS — M47816 Spondylosis without myelopathy or radiculopathy, lumbar region: Secondary | ICD-10-CM | POA: Diagnosis not present

## 2015-04-22 DIAGNOSIS — F410 Panic disorder [episodic paroxysmal anxiety] without agoraphobia: Secondary | ICD-10-CM | POA: Diagnosis not present

## 2015-04-22 DIAGNOSIS — F341 Dysthymic disorder: Secondary | ICD-10-CM | POA: Diagnosis not present

## 2015-04-22 DIAGNOSIS — F331 Major depressive disorder, recurrent, moderate: Secondary | ICD-10-CM | POA: Diagnosis not present

## 2015-04-24 DIAGNOSIS — Z23 Encounter for immunization: Secondary | ICD-10-CM | POA: Diagnosis not present

## 2015-04-29 ENCOUNTER — Other Ambulatory Visit (INDEPENDENT_AMBULATORY_CARE_PROVIDER_SITE_OTHER): Payer: Medicare Other | Admitting: *Deleted

## 2015-04-29 ENCOUNTER — Telehealth: Payer: Self-pay

## 2015-04-29 DIAGNOSIS — I10 Essential (primary) hypertension: Secondary | ICD-10-CM | POA: Diagnosis not present

## 2015-04-29 LAB — BASIC METABOLIC PANEL
BUN: 8 mg/dL (ref 6–23)
CHLORIDE: 91 meq/L — AB (ref 96–112)
CO2: 37 mEq/L — ABNORMAL HIGH (ref 19–32)
Calcium: 10 mg/dL (ref 8.4–10.5)
Creatinine, Ser: 0.98 mg/dL (ref 0.40–1.20)
GFR: 58.04 mL/min — ABNORMAL LOW (ref >60.00)
Glucose, Bld: 146 mg/dL — ABNORMAL HIGH (ref 70–99)
POTASSIUM: 3.5 meq/L (ref 3.5–5.1)
Sodium: 134 mEq/L — ABNORMAL LOW (ref 135–145)

## 2015-04-29 MED ORDER — POTASSIUM CHLORIDE CRYS ER 20 MEQ PO TBCR: 20.0000 meq | EXTENDED_RELEASE_TABLET | Freq: Every day | ORAL | Status: DC

## 2015-04-29 NOTE — Telephone Encounter (Signed)
Informed patient of results and verbal understanding expressed.  Instructed patient to START POTASSIUM 20 meq daily. Repeat BMET scheduled for 10/5.  Patient agrees with treatment plan.  Patient also st she discontinued fentanyl patch and increased dosages of oxycodone. Med list updated.

## 2015-04-29 NOTE — Telephone Encounter (Signed)
-----   Message from Sueanne Margarita, MD sent at 04/29/2015  4:49 PM EDT ----- Add Kdur 20 meq daily and recheck BMET in 1 week

## 2015-05-05 NOTE — Progress Notes (Signed)
Cardiology Office Note   Date:  05/06/2015   ID:  [name], DOB Feb 03, 1933, MRN [id number]  PCP:  Marcial Pacas, DO  Cardiologist:  Dr. Fransico Him   Electrophysiologist:  n/a  Chief Complaint  Patient presents with  . Follow-up    Bradycardia, HTN     History of Present Illness: [name] is a 81 y.o. female with a hx of HTN, asymptomatic bradycardia. Beta blocker has been stopped in the past. Last seen by Dr. Radford Pax 04/20/15. Primary care physician had recently placed her back on beta blocker. When she saw Dr. Radford Pax, her heart rate was 49. She was weak, tired and anxious. Tenoretic was stopped. HCTZ 25 mg daily was started. She returns for follow-up. Follow-up labs last week demonstrated K 3.5. Potassium was started and BMET pending.  She is doing much better. Fatigue is improved.  No dizziness.  No syncope.  No chest pain, orthopnea, PND, edema.     Studies/Reports Reviewed Today:  None    Past Medical History  Diagnosis Date  . Asthma   . Depression   . GERD (gastroesophageal reflux disease)   . Arthritis     osteoarthritis  . Anxiety   . Osteopenia   . Candida esophagitis ([place])   . Diverticulosis   . Tubular adenoma of colon   . Osteoarthritis   . DJD (degenerative joint disease)      L5 compression fracture  . Gout   . Renal failure, unspecified   . Fibromyalgia   . C. difficile diarrhea   . Chronic sinusitis   . Panic disorder   . Spinal stenosis of lumbar region   . Hyperlipidemia   . Hypertension   . Edema extremities   . Depression     Past Surgical History  Procedure Laterality Date  . Total hip arthroplasty  2004    right  . Total knee arthroplasty  2006    right  . Carpal tunnel release  2007, 2009    Bilateral  . Shoulder surgery  2004, 07/30/10, 01/2011    after her right humeral neck fracture, revision hemiarthroplasty 2004 for persistent pain, revision reverse arthroplasty December 2011 for persistent pain, incision and  drainage with poly-exchange 01/26/2011 for possible prosthetic joint infection.  . Total abdominal hysterectomy    . Ganglion cyst  wrist    . Ganglion cyct removal on fingers      right  . Tonsillectomy and adenoidectomy    . Nasal sinus surgery    . Total shoulder replacement  2002    right  . Knee arthroscopy      right  . Cataract extraction, bilateral    . Choanal adeniodectomy    . Adenoidectomy       Current Outpatient Prescriptions  Medication Sig Dispense Refill  . ALPRAZolam (XANAX) 0.5 MG tablet Take 0.5 mg by mouth. For anxiety;one tablet daily    . amLODipine (NORVASC) 10 MG tablet Take 1 tablet (10 mg total) by mouth daily. 90 tablet 2  . Calcium Carbonate-Vitamin D (CALCIUM 600+D) 600-400 MG-UNIT per tablet Take 1 tablet by mouth 2 (two) times daily.    . diphenhydrAMINE (BENADRYL) 25 MG tablet Take 25 mg by mouth every 6 (six) hours as needed. For allergies    . EPINEPHrine (EPI-PEN) 0.3 mg/0.3 mL DEVI Inject 0.3 mg into the muscle as needed (anaphylaxis).     [name] escitalopram (LEXAPRO) 20 MG tablet Take 20 mg by mouth daily.    [name]  fluticasone (FLONASE) 50 MCG/ACT nasal spray Place 2 sprays into both nostrils daily as needed for allergies or rhinitis.    . hydrochlorothiazide (HYDRODIURIL) 25 MG tablet Take 1 tablet (25 mg total) by mouth daily. 30 tablet 6  . ipratropium (ATROVENT) 0.06 % nasal spray Place 2 sprays into both nostrils 3 (three) times daily as needed for rhinitis. 15 mL 12  . montelukast (SINGULAIR) 10 MG tablet Take 1 tablet (10 mg total) by mouth at bedtime. 30 tablet 3  . omeprazole (PRILOSEC) 20 MG capsule Take 1 capsule (20 mg total) by mouth daily. 90 capsule 1  . oxycodone (OXY-IR) 5 MG capsule Take 10 mg by mouth 4 (four) times daily.     . potassium chloride SA (K-DUR,KLOR-CON) 20 MEQ tablet Take 1 tablet (20 mEq total) by mouth daily. 30 tablet 6  . simvastatin (ZOCOR) 20 MG tablet Take 20 mg by mouth daily.    . zoledronic acid (RECLAST) 5  MG/100ML SOLN Inject 5 mg into the vein. Once yearly. In June    . [DISCONTINUED] venlafaxine XR (EFFEXOR XR) 75 MG 24 hr capsule Take 75 mg by mouth daily with breakfast.     No current facility-administered medications for this visit.    Allergies:   Bee venom; Ceftin; Ciprofloxacin; Ephedrine; Sulfonamide derivatives; Telithromycin; Valsartan; Verapamil; Venlafaxine; Beta adrenergic blockers; Cephalosporins; Clindamycin/lincomycin; Cymbalta; Gabapentin; Lamotrigine; Mirtazapine; Morphine; Oxycodone-acetaminophen; Pregabalin; Prochlorperazine edisylate; Pseudoephedrine; Relafen; Zoloft; and Doxycycline    Social History:  The patient  reports that she has never smoked. She has never used smokeless tobacco. She reports that she does not drink alcohol or use illicit drugs.   Family History:  The patient's family history includes Breast cancer in an other family member; Cancer in her father; Colon polyps in an other family member; Depression in her son; Diabetes in her mother; Hypertension in her mother.    ROS:   Please see the history of present illness.   Review of Systems  All other systems reviewed and are negative.     PHYSICAL EXAM: VS:  BP 130/60 mmHg  Pulse 83  Ht 5\' 1"  (1.549 m)  Wt 159 lb 12.8 oz (72.485 kg)  BMI 30.21 kg/m2    Wt Readings from Last 3 Encounters:  05/06/15 159 lb 12.8 oz (72.485 kg)  04/20/15 158 lb 12.8 oz (72.031 kg)  04/07/15 161 lb (73.029 kg)     GEN: Well nourished, well developed, in no acute distress HEENT: normal Neck: no JVD,   no masses Cardiac:  Normal S1/S2, RRR; no murmur ,  no rubs or gallops, no edema   Respiratory:  clear to auscultation bilaterally, no wheezing, rhonchi or rales. GI: soft, nontender, nondistended, + BS MS: no deformity or atrophy Skin: warm and dry  Neuro:  CNs II-XII intact, Strength and sensation are intact Psych: Normal affect   EKG:  EKG is ordered today.  It demonstrates:    NSR, HR 69, LAD    Recent  Labs: 03/09/2015: ALT 43*; Hemoglobin 14.7; Platelets 257; TSH 2.639 04/29/2015: BUN 8; Creatinine, Ser 0.98; Potassium 3.5; Sodium 134*    Lipid Panel    Component Value Date/Time   CHOL 168 09/04/2014 0917   TRIG 148 09/04/2014 0917   HDL 42 09/04/2014 0917   CHOLHDL 4.0 09/04/2014 0917   VLDL 30 09/04/2014 0917   LDLCALC 96 09/04/2014 0917      ASSESSMENT AND PLAN:  1. Bradycardia:  Resolved. She feels much better. She avoid AV nodal blocking  agents in the future.  2. HTN:  Controlled on current regimen.  3. Hyperlipidemia:  Continue statin.  4. Hypokalemia:  Continue K+ supplement. Repeat BMET pending today.      Medication Changes: Current medicines are reviewed at length with the patient today.  Concerns regarding medicines are as outlined above.  The following changes have been made:   Discontinued Medications   No medications on file   Modified Medications   No medications on file   New Prescriptions   No medications on file    Labs/ tests ordered today include:   No orders of the defined types were placed in this encounter.     Disposition:    FU with Dr. Fransico Him as planned.    Signed, Versie Starks, MHS 05/06/2015 2:33 PM    [facility] [address], [place], [place]  [address] Phone: [phone]; Fax: [phone]

## 2015-05-06 ENCOUNTER — Ambulatory Visit (INDEPENDENT_AMBULATORY_CARE_PROVIDER_SITE_OTHER): Payer: Medicare Other | Admitting: Physician Assistant

## 2015-05-06 ENCOUNTER — Encounter: Payer: Self-pay | Admitting: Physician Assistant

## 2015-05-06 ENCOUNTER — Other Ambulatory Visit: Payer: Medicare Other

## 2015-05-06 VITALS — BP 130/60 | HR 83 | Ht 61.0 in | Wt 159.8 lb

## 2015-05-06 DIAGNOSIS — I10 Essential (primary) hypertension: Secondary | ICD-10-CM

## 2015-05-06 DIAGNOSIS — F410 Panic disorder [episodic paroxysmal anxiety] without agoraphobia: Secondary | ICD-10-CM | POA: Diagnosis not present

## 2015-05-06 DIAGNOSIS — E785 Hyperlipidemia, unspecified: Secondary | ICD-10-CM

## 2015-05-06 DIAGNOSIS — E876 Hypokalemia: Secondary | ICD-10-CM | POA: Diagnosis not present

## 2015-05-06 DIAGNOSIS — F331 Major depressive disorder, recurrent, moderate: Secondary | ICD-10-CM | POA: Diagnosis not present

## 2015-05-06 DIAGNOSIS — F341 Dysthymic disorder: Secondary | ICD-10-CM | POA: Diagnosis not present

## 2015-05-06 DIAGNOSIS — R001 Bradycardia, unspecified: Secondary | ICD-10-CM

## 2015-05-06 DIAGNOSIS — I498 Other specified cardiac arrhythmias: Secondary | ICD-10-CM | POA: Diagnosis not present

## 2015-05-06 LAB — BASIC METABOLIC PANEL
BUN: 10 mg/dL (ref 7–25)
CO2: 24 mmol/L (ref 20–31)
CREATININE: 1.05 mg/dL — AB (ref 0.60–0.88)
Calcium: 9.5 mg/dL (ref 8.6–10.4)
Chloride: 92 mmol/L — ABNORMAL LOW (ref 98–110)
Glucose, Bld: 174 mg/dL — ABNORMAL HIGH (ref 65–99)
POTASSIUM: 4.1 mmol/L (ref 3.5–5.3)
Sodium: 133 mmol/L — ABNORMAL LOW (ref 135–146)

## 2015-05-06 NOTE — Addendum Note (Signed)
Addended by: Michae Kava on: 05/06/2015 02:37 PM   Modules accepted: Orders

## 2015-05-06 NOTE — Patient Instructions (Signed)
Medication Instructions:  No changes.  Labwork: Today:  BMET  Testing/Procedures: None   Follow-Up: Dr. Fransico Him as planned.  You should get a card in the mail in the next few months to call and schedule an appointment in 09/2015.  Any Other Special Instructions Will Be Listed Below (If Applicable).

## 2015-05-07 ENCOUNTER — Telehealth: Payer: Self-pay | Admitting: Physician Assistant

## 2015-05-07 NOTE — Telephone Encounter (Signed)
New message      Pt recently saw Richardson Dopp.  She said her EKG from Dr Radford Pax visit and her EKG from Scott's visit were different.  Talk to a nurse regarding her EKG changes.

## 2015-05-07 NOTE — Telephone Encounter (Signed)
Pt calling and asking why was her EKG from Dr. Radford Pax and the EKG from 10/5 were different; pt would like a call back. EKG from 10/5 not scanned in yet. I will try to obtain EKG from Med Rec Dept for review. I will route this message to Richardson Dopp, PA for further advise.

## 2015-05-07 NOTE — Telephone Encounter (Signed)
Pt notified of lab results. I also s/w pt about her call earlier from today about her EKG from 10/5 ov. I advised pt that I d/w PA and he said that her HR has improved and NSR. Pt states that she was told may need a Psychologist, forensic. I advised pt she does not need a pacemaker, that the PA was saying that when we sometimes see low heart rates we may need to think about possibly does this pt need a pacemaker. I advised Pt that I did s/w Nicki Reaper W. PA today about all of this and her concerns and he stated she does not need a pacemaker. Pt said thank you and she was sorry for the confusion, she just wanted to make sure she was not needing a pacemaker, because her hr was low at 49 when she saw Dr. Radford Pax because she states that her PCP put her on a beta blocker that dropped her hr too low, though she is not on this medication now and her hr is doing better and back in the normal range.

## 2015-05-07 NOTE — Telephone Encounter (Signed)
The only difference is that her her rate is improved.  It was 49 bpm when she saw Dr. Fransico Him and, yesterday it was 83. There were no other differences. Overall, her HR is improved to normal. Richardson Dopp, PA-C   05/07/2015 10:04 AM

## 2015-05-08 NOTE — Addendum Note (Signed)
Addended by: Freada Bergeron on: 05/08/2015 05:59 PM   Modules accepted: Orders

## 2015-05-12 DIAGNOSIS — F330 Major depressive disorder, recurrent, mild: Secondary | ICD-10-CM | POA: Diagnosis not present

## 2015-05-18 ENCOUNTER — Telehealth: Payer: Self-pay | Admitting: *Deleted

## 2015-05-18 NOTE — Telephone Encounter (Signed)
Pt called and states she has been taking norvasc, hctz, and potassium.Her BP has been running in the 120's - 140's over 80's and her pulse has been running in the 90's. Pt wants to know if her pulse is running too high

## 2015-05-19 DIAGNOSIS — Z79891 Long term (current) use of opiate analgesic: Secondary | ICD-10-CM | POA: Diagnosis not present

## 2015-05-19 DIAGNOSIS — M069 Rheumatoid arthritis, unspecified: Secondary | ICD-10-CM | POA: Diagnosis not present

## 2015-05-19 DIAGNOSIS — G894 Chronic pain syndrome: Secondary | ICD-10-CM | POA: Diagnosis not present

## 2015-05-19 DIAGNOSIS — M2550 Pain in unspecified joint: Secondary | ICD-10-CM | POA: Diagnosis not present

## 2015-05-19 DIAGNOSIS — M47816 Spondylosis without myelopathy or radiculopathy, lumbar region: Secondary | ICD-10-CM | POA: Diagnosis not present

## 2015-05-19 NOTE — Telephone Encounter (Signed)
These numbers sound good.  I would only be concerned if her pulse was consistently above 100 bpm.

## 2015-05-19 NOTE — Telephone Encounter (Signed)
Pt.notified

## 2015-05-26 ENCOUNTER — Encounter: Payer: Self-pay | Admitting: Family Medicine

## 2015-05-26 ENCOUNTER — Other Ambulatory Visit: Payer: Self-pay | Admitting: Family Medicine

## 2015-05-26 ENCOUNTER — Ambulatory Visit (INDEPENDENT_AMBULATORY_CARE_PROVIDER_SITE_OTHER): Payer: Medicare Other | Admitting: Family Medicine

## 2015-05-26 VITALS — BP 130/78 | HR 114

## 2015-05-26 DIAGNOSIS — R197 Diarrhea, unspecified: Secondary | ICD-10-CM | POA: Diagnosis not present

## 2015-05-26 DIAGNOSIS — R739 Hyperglycemia, unspecified: Secondary | ICD-10-CM | POA: Diagnosis not present

## 2015-05-26 DIAGNOSIS — R7989 Other specified abnormal findings of blood chemistry: Secondary | ICD-10-CM

## 2015-05-26 DIAGNOSIS — R799 Abnormal finding of blood chemistry, unspecified: Secondary | ICD-10-CM | POA: Diagnosis not present

## 2015-05-26 DIAGNOSIS — R945 Abnormal results of liver function studies: Principal | ICD-10-CM

## 2015-05-26 DIAGNOSIS — R7309 Other abnormal glucose: Secondary | ICD-10-CM | POA: Diagnosis not present

## 2015-05-26 DIAGNOSIS — K759 Inflammatory liver disease, unspecified: Secondary | ICD-10-CM | POA: Diagnosis not present

## 2015-05-26 MED ORDER — ONDANSETRON HCL 4 MG PO TABS: 4.0000 mg | ORAL_TABLET | Freq: Three times a day (TID) | ORAL | Status: DC | PRN

## 2015-05-26 NOTE — Progress Notes (Signed)
CC: [name] is a 81 y.o. female is here for Nausea   Subjective: HPI:  Thursday night she developed nausea, it has been present ever since. It's persistent and moderate in severity. It is worse when thinking about food or face with food. She's had a moderate degree of appetite loss as well. She's had diarrhea that began sometime over the weekend. She reports having at least 3 loose bowel movements on a daily basis and constantly has the sensation that she needs to have a bowel movement. She denies fevers, chills, abdominal pain, nor genitourinary complaints. Symptoms have slightly been improved with use of Dramamine that but she ran out of a supply over the weekend. She denies confusion or any vomiting. She denies any pulmonary complaints.symptoms did not coincide with change in any of her recent medications. Random blood sugar check 2 weeks ago was 170.      Review Of Systems Outlined In HPI  Past Medical History  Diagnosis Date  . Asthma   . Depression   . GERD (gastroesophageal reflux disease)   . Arthritis     osteoarthritis  . Anxiety   . Osteopenia   . Candida esophagitis ([place])   . Diverticulosis   . Tubular adenoma of colon   . Osteoarthritis   . DJD (degenerative joint disease)      L5 compression fracture  . Gout   . Renal failure, unspecified   . Fibromyalgia   . C. difficile diarrhea   . Chronic sinusitis   . Panic disorder   . Spinal stenosis of lumbar region   . Hyperlipidemia   . Hypertension   . Edema extremities   . Depression     Past Surgical History  Procedure Laterality Date  . Total hip arthroplasty  2004    right  . Total knee arthroplasty  2006    right  . Carpal tunnel release  2007, 2009    Bilateral  . Shoulder surgery  2004, 07/30/10, 01/2011    after her right humeral neck fracture, revision hemiarthroplasty 2004 for persistent pain, revision reverse arthroplasty December 2011 for persistent pain, incision and drainage with  poly-exchange 01/26/2011 for possible prosthetic joint infection.  . Total abdominal hysterectomy    . Ganglion cyst  wrist    . Ganglion cyct removal on fingers      right  . Tonsillectomy and adenoidectomy    . Nasal sinus surgery    . Total shoulder replacement  2002    right  . Knee arthroscopy      right  . Cataract extraction, bilateral    . Choanal adeniodectomy    . Adenoidectomy     Family History  Problem Relation Age of Onset  . Breast cancer    . Colon polyps    . Cancer Father     bladder  . Diabetes Mother   . Hypertension Mother   . Depression Son     Social History   Social History  . Marital Status: Married    Spouse Name: N/A  . Number of Children: N/A  . Years of Education: N/A   Occupational History  . Not on file.   Social History Main Topics  . Smoking status: Never Smoker   . Smokeless tobacco: Never Used  . Alcohol Use: No  . Drug Use: No  . Sexual Activity: Not on file   Other Topics Concern  . Not on file   Social History Narrative  Objective: BP 130/78 mmHg  Pulse 114  General: Alert and Oriented, No Acute Distress HEENT: Pupils equal, round, reactive to light. Conjunctivae clear.  Moist mucous membranes pharynx Lungs: Clear to auscultation bilaterally, no wheezing/ronchi/rales.  Comfortable work of breathing. Good air movement. Cardiac: Regular rate and rhythm. Normal S1/S2.  No murmurs, rubs, nor gallops.   Abdomen: Normal bowel sounds, soft and non tender without palpable masses. No rebound tenderness nor guarding. Extremities: No peripheral edema.  Strong peripheral pulses.  Mental Status: No depression, anxiety, nor agitation. Skin: Warm and dry.  Assessment & Plan: Teresia was seen today for nausea.  Diagnoses and all orders for this visit:  Elevated LFTs -     Hepatitis panel, acute -     Hemoglobin A1c  Hyperglycemia -     Hepatitis panel, acute -     Hemoglobin A1c  Diarrhea, unspecified type -      Hepatitis panel, acute -     COMPLETE METABOLIC PANEL WITH GFR -     Clostridium difficile EIA  Other orders -     ondansetron (ZOFRAN) 4 MG tablet; Take 1-2 tablets (4-8 mg total) by mouth every 8 (eight) hours as needed for nausea or vomiting.   Diarrhea with nausea: Start as needed Zofran, searching for source of her nausea with ruling out C. Difficile given her recent diarrhea, rechecking liver function and BUN. Ultimate plan will be based on the above results.  Return if symptoms worsen or fail to improve.

## 2015-05-26 NOTE — Addendum Note (Signed)
Addended by: Delrae Alfred on: 05/26/2015 04:37 PM   Modules accepted: Orders

## 2015-05-27 LAB — COMPLETE METABOLIC PANEL WITH GFR
ALBUMIN: 4 g/dL (ref 3.6–5.1)
ALK PHOS: 79 U/L (ref 33–130)
ALT: 58 U/L — ABNORMAL HIGH (ref 6–29)
AST: 79 U/L — ABNORMAL HIGH (ref 10–35)
BILIRUBIN TOTAL: 0.7 mg/dL (ref 0.2–1.2)
BUN: 12 mg/dL (ref 7–25)
CO2: 27 mmol/L (ref 20–31)
Calcium: 10.2 mg/dL (ref 8.6–10.4)
Chloride: 89 mmol/L — ABNORMAL LOW (ref 98–110)
Creat: 0.92 mg/dL — ABNORMAL HIGH (ref 0.60–0.88)
GFR, EST AFRICAN AMERICAN: 68 mL/min (ref >=60)
GFR, EST NON AFRICAN AMERICAN: 59 mL/min — AB (ref >=60)
Glucose, Bld: 103 mg/dL — ABNORMAL HIGH (ref 65–99)
Potassium: 4 mmol/L (ref 3.5–5.3)
Sodium: 128 mmol/L — ABNORMAL LOW (ref 135–146)
TOTAL PROTEIN: 7.3 g/dL (ref 6.1–8.1)

## 2015-05-27 LAB — HEMOGLOBIN A1C
Hgb A1c MFr Bld: 6.2 % — ABNORMAL HIGH (ref <5.7)
Mean Plasma Glucose: 131 mg/dL — ABNORMAL HIGH (ref <117)

## 2015-05-28 ENCOUNTER — Telehealth: Payer: Self-pay | Admitting: Family Medicine

## 2015-05-28 LAB — HEPATITIS PANEL, ACUTE
HCV AB: NEGATIVE
HEP B C IGM: NONREACTIVE
HEP B S AG: NEGATIVE
Hep A IgM: NONREACTIVE

## 2015-05-28 LAB — CLOSTRIDIUM DIFFICILE BY PCR: Toxigenic C. Difficile by PCR: NOT DETECTED

## 2015-05-28 LAB — C. DIFFICILE GDH AND TOXIN A/B
C. difficile GDH: DETECTED — AB
C. difficile Toxin A/B: NOT DETECTED

## 2015-05-28 NOTE — Telephone Encounter (Signed)
Pt called and stated that she the Hep C test because she was told it was coded wrong and her insurance was not going to pay for it. She said she wanted to talk to you about it because she claims it is urgent and I let her know you were seeing patients this morning. She did make an apt for tomorrow morning . Thanks

## 2015-05-28 NOTE — Telephone Encounter (Signed)
The hepatitis test was apparently run and results came to me today, it is reassuring, no need for an appointment really.  See result notes for further details.

## 2015-05-29 ENCOUNTER — Encounter: Payer: Self-pay | Admitting: Family Medicine

## 2015-05-29 ENCOUNTER — Ambulatory Visit (INDEPENDENT_AMBULATORY_CARE_PROVIDER_SITE_OTHER): Payer: Medicare Other | Admitting: Family Medicine

## 2015-05-29 VITALS — BP 130/66 | HR 92 | Wt 155.0 lb

## 2015-05-29 DIAGNOSIS — K759 Inflammatory liver disease, unspecified: Secondary | ICD-10-CM

## 2015-05-29 DIAGNOSIS — R197 Diarrhea, unspecified: Secondary | ICD-10-CM | POA: Diagnosis not present

## 2015-05-29 DIAGNOSIS — R945 Abnormal results of liver function studies: Principal | ICD-10-CM

## 2015-05-29 DIAGNOSIS — R7989 Other specified abnormal findings of blood chemistry: Secondary | ICD-10-CM

## 2015-05-29 MED ORDER — DIPHENOXYLATE-ATROPINE 2.5-0.025 MG PO TABS: 1.0000 | ORAL_TABLET | Freq: Four times a day (QID) | ORAL | Status: DC | PRN

## 2015-05-29 MED ORDER — SIMVASTATIN 20 MG PO TABS: 10.0000 mg | ORAL_TABLET | Freq: Every day | ORAL | Status: DC

## 2015-05-29 NOTE — Telephone Encounter (Signed)
Patient advised of recommendations from lab results.

## 2015-05-29 NOTE — Progress Notes (Signed)
CC: [name] is a 81 y.o. female is here for Labs Only   Subjective: HPI:  Follow-up elevated liver function. She tells me that the laboratory charged her a little over $200 because the diagnosis of elevated liver function tests would not cover the test for her acute hepatitis panel. Understandably she is upset. She wants to know why this is. She denies right upper quadrant pain but still has some loose diarrhea. She tells me the diarrhea is improving. She's not been using anything to help as of yet. She is no longer nauseous. She denies fevers, chills and denies any known needlesticks or exposure to any hepatitis subtype.   Review Of Systems Outlined In HPI  Past Medical History  Diagnosis Date  . Asthma   . Depression   . GERD (gastroesophageal reflux disease)   . Arthritis     osteoarthritis  . Anxiety   . Osteopenia   . Candida esophagitis ([place])   . Diverticulosis   . Tubular adenoma of colon   . Osteoarthritis   . DJD (degenerative joint disease)      L5 compression fracture  . Gout   . Renal failure, unspecified   . Fibromyalgia   . C. difficile diarrhea   . Chronic sinusitis   . Panic disorder   . Spinal stenosis of lumbar region   . Hyperlipidemia   . Hypertension   . Edema extremities   . Depression     Past Surgical History  Procedure Laterality Date  . Total hip arthroplasty  2004    right  . Total knee arthroplasty  2006    right  . Carpal tunnel release  2007, 2009    Bilateral  . Shoulder surgery  2004, 07/30/10, 01/2011    after her right humeral neck fracture, revision hemiarthroplasty 2004 for persistent pain, revision reverse arthroplasty December 2011 for persistent pain, incision and drainage with poly-exchange 01/26/2011 for possible prosthetic joint infection.  . Total abdominal hysterectomy    . Ganglion cyst  wrist    . Ganglion cyct removal on fingers      right  . Tonsillectomy and adenoidectomy    . Nasal sinus surgery    . Total  shoulder replacement  2002    right  . Knee arthroscopy      right  . Cataract extraction, bilateral    . Choanal adeniodectomy    . Adenoidectomy     Family History  Problem Relation Age of Onset  . Breast cancer    . Colon polyps    . Cancer Father     bladder  . Diabetes Mother   . Hypertension Mother   . Depression Son     Social History   Social History  . Marital Status: Married    Spouse Name: N/A  . Number of Children: N/A  . Years of Education: N/A   Occupational History  . Not on file.   Social History Main Topics  . Smoking status: Never Smoker   . Smokeless tobacco: Never Used  . Alcohol Use: No  . Drug Use: No  . Sexual Activity: Not on file   Other Topics Concern  . Not on file   Social History Narrative     Objective: BP 130/66 mmHg  Pulse 92  Wt 155 lb (70.308 kg)  Vital signs reviewed. General: Alert and Oriented, No Acute Distress HEENT: Pupils equal, round, reactive to light. Conjunctivae clear.  External ears unremarkable.  Moist mucous membranes.  Lungs: Clear and comfortable work of breathing, speaking in full sentences without accessory muscle use. Cardiac: Regular rate and rhythm.  Neuro: CN II-XII grossly intact, gait normal. Extremities: No peripheral edema.  Strong peripheral pulses.  Mental Status: No depression, anxiety, nor agitation. Logical though process. Skin: Warm and dry.  Assessment & Plan: [name] was seen today for labs only.  Diagnoses and all orders for this visit:  Elevated LFTs  Hepatitis  Diarrhea, unspecified type -     diphenoxylate-atropine (LOMOTIL) 2.5-0.025 MG tablet; Take 1 tablet by mouth 4 (four) times daily as needed for diarrhea or loose stools.   Elevated LFTs are improving however do reflect hepatitis. I have asked my clinic manager to use this diagnosis to resubmit a claim for her recent acute hepatitis panel. Time was taken to discuss why the diagnosis of "elevated LFT"  This translated to  a ICD-9 code that her insurance company interpreted as "abnormal blood test" which is not sufficient to cover a acute hepatitis panel.  For any lingering diarrhea start Lomotil Decreasing simvastatin in hopes of reducing LFTs if this is a contributor.  25 minutes spent face-to-face during visit today of which at least 50% was counseling or coordinating care regarding: 1. Elevated LFTs   2. Hepatitis   3. Diarrhea, unspecified type      Return if symptoms worsen or fail to improve.

## 2015-06-09 DIAGNOSIS — F341 Dysthymic disorder: Secondary | ICD-10-CM | POA: Diagnosis not present

## 2015-06-09 DIAGNOSIS — F331 Major depressive disorder, recurrent, moderate: Secondary | ICD-10-CM | POA: Diagnosis not present

## 2015-06-09 DIAGNOSIS — F410 Panic disorder [episodic paroxysmal anxiety] without agoraphobia: Secondary | ICD-10-CM | POA: Diagnosis not present

## 2015-06-16 DIAGNOSIS — M2550 Pain in unspecified joint: Secondary | ICD-10-CM | POA: Diagnosis not present

## 2015-06-16 DIAGNOSIS — G894 Chronic pain syndrome: Secondary | ICD-10-CM | POA: Diagnosis not present

## 2015-06-16 DIAGNOSIS — M069 Rheumatoid arthritis, unspecified: Secondary | ICD-10-CM | POA: Diagnosis not present

## 2015-06-16 DIAGNOSIS — M47816 Spondylosis without myelopathy or radiculopathy, lumbar region: Secondary | ICD-10-CM | POA: Diagnosis not present

## 2015-06-17 ENCOUNTER — Encounter: Payer: Self-pay | Admitting: Family Medicine

## 2015-06-17 ENCOUNTER — Ambulatory Visit (INDEPENDENT_AMBULATORY_CARE_PROVIDER_SITE_OTHER): Payer: Medicare Other | Admitting: Family Medicine

## 2015-06-17 VITALS — BP 121/71 | HR 87 | Wt 157.0 lb

## 2015-06-17 DIAGNOSIS — J329 Chronic sinusitis, unspecified: Secondary | ICD-10-CM | POA: Diagnosis not present

## 2015-06-17 DIAGNOSIS — M069 Rheumatoid arthritis, unspecified: Secondary | ICD-10-CM | POA: Diagnosis not present

## 2015-06-17 DIAGNOSIS — B9689 Other specified bacterial agents as the cause of diseases classified elsewhere: Secondary | ICD-10-CM

## 2015-06-17 DIAGNOSIS — A499 Bacterial infection, unspecified: Secondary | ICD-10-CM | POA: Diagnosis not present

## 2015-06-17 MED ORDER — AMOXICILLIN 500 MG PO CAPS: 500.0000 mg | ORAL_CAPSULE | Freq: Three times a day (TID) | ORAL | Status: DC

## 2015-06-17 MED ORDER — AMOXICILLIN-POT CLAVULANATE 500-125 MG PO TABS: ORAL_TABLET | ORAL | Status: DC

## 2015-06-17 MED ORDER — AMBULATORY NON FORMULARY MEDICATION: Status: DC

## 2015-06-17 NOTE — Progress Notes (Signed)
CC: [name] is a 81 y.o. female is here for Dry Nasal Passages   Subjective: HPI:  Complains of facial pressure in both cheeks, postnasal drip, stuffy nose. She is also getting specks of blood when she blows her nose. No interventions as of yet. Symptoms have been present for 7-8 days. Symptoms seem to be worsening now moderate in severity. Nothing seems to make them better or worse. Denies cough, shortness of breath, wheezing, difficulty swallowing or fever.  She's also requesting a new [place], the one she is using now is 81 years old and has quite a bit of play to it   Review Of Systems Outlined In HPI  Past Medical History  Diagnosis Date  . Asthma   . Depression   . GERD (gastroesophageal reflux disease)   . Arthritis     osteoarthritis  . Anxiety   . Osteopenia   . Candida esophagitis ([place])   . Diverticulosis   . Tubular adenoma of colon   . Osteoarthritis   . DJD (degenerative joint disease)      L5 compression fracture  . Gout   . Renal failure, unspecified   . Fibromyalgia   . C. difficile diarrhea   . Chronic sinusitis   . Panic disorder   . Spinal stenosis of lumbar region   . Hyperlipidemia   . Hypertension   . Edema extremities   . Depression     Past Surgical History  Procedure Laterality Date  . Total hip arthroplasty  2004    right  . Total knee arthroplasty  2006    right  . Carpal tunnel release  2007, 2009    Bilateral  . Shoulder surgery  2004, 07/30/10, 01/2011    after her right humeral neck fracture, revision hemiarthroplasty 2004 for persistent pain, revision reverse arthroplasty December 2011 for persistent pain, incision and drainage with poly-exchange 01/26/2011 for possible prosthetic joint infection.  . Total abdominal hysterectomy    . Ganglion cyst  wrist    . Ganglion cyct removal on fingers      right  . Tonsillectomy and adenoidectomy    . Nasal sinus surgery    . Total shoulder replacement  2002    right  . Knee  arthroscopy      right  . Cataract extraction, bilateral    . Choanal adeniodectomy    . Adenoidectomy     Family History  Problem Relation Age of Onset  . Breast cancer    . Colon polyps    . Cancer Father     bladder  . Diabetes Mother   . Hypertension Mother   . Depression Son     Social History   Social History  . Marital Status: Married    Spouse Name: N/A  . Number of Children: N/A  . Years of Education: N/A   Occupational History  . Not on file.   Social History Main Topics  . Smoking status: Never Smoker   . Smokeless tobacco: Never Used  . Alcohol Use: No  . Drug Use: No  . Sexual Activity: Not on file   Other Topics Concern  . Not on file   Social History Narrative     Objective: BP 121/71 mmHg  Pulse 87  Wt 157 lb (71.215 kg)  General: Alert and Oriented, No Acute Distress HEENT: Pupils equal, round, reactive to light. Conjunctivae clear.  External ears unremarkable, canals clear with intact TMs with appropriate landmarks.  Middle ear appears  open without effusion. Dry andPink inferior turbinates.  Moist mucous membranes, pharynx without inflammation nor lesions.  Neck supple without palpable lymphadenopathy nor abnormal masses. Lungs: Clear to auscultation bilaterally, no wheezing/ronchi/rales.  Comfortable work of breathing. Good air movement. Extremities: No peripheral edema.  Strong peripheral pulses.  Mental Status: No depression, anxiety, nor agitation. Skin: Warm and dry.  Assessment & Plan: [name] was seen today for dry nasal passages.  Diagnoses and all orders for this visit:  Bacterial sinusitis -     Discontinue: amoxicillin-clavulanate (AUGMENTIN) 500-125 MG tablet; Take one by mouth every 8 hours for ten total days.  Rheumatoid arthritis of hand, unspecified laterality, unspecified rheumatoid factor presence ([place]) -     AMBULATORY NON FORMULARY MEDICATION; Rolling Vanostrand.  Dx: Rheumatoid Arthritis.  Use daily as needed for  ambulation.  Other orders -     amoxicillin (AMOXIL) 500 MG capsule; Take 1 capsule (500 mg total) by mouth 3 (three) times daily.   Actual sinusitis: Start amoxicillin, she is intolerant of clavulanic acid and many antibiotics. Rheumatoid arthritis: Primary management still with rheumatology, new prescription for Pethtel today.  25 minutes spent face-to-face during visit today of which at least 50% was counseling or coordinating care regarding: 1. Bacterial sinusitis   2. Rheumatoid arthritis of hand, unspecified laterality, unspecified rheumatoid factor presence ([place])      Return if symptoms worsen or fail to improve.

## 2015-07-07 IMAGING — CR DG SHOULDER 2+V*L*
3 series · 3 of 3 positions shown · non-contrast
Comparison: None

CLINICAL DATA: Fall, shoulder pain.

EXAM:
LEFT SHOULDER - 2+ VIEW

[w shoulder ap internal left *]
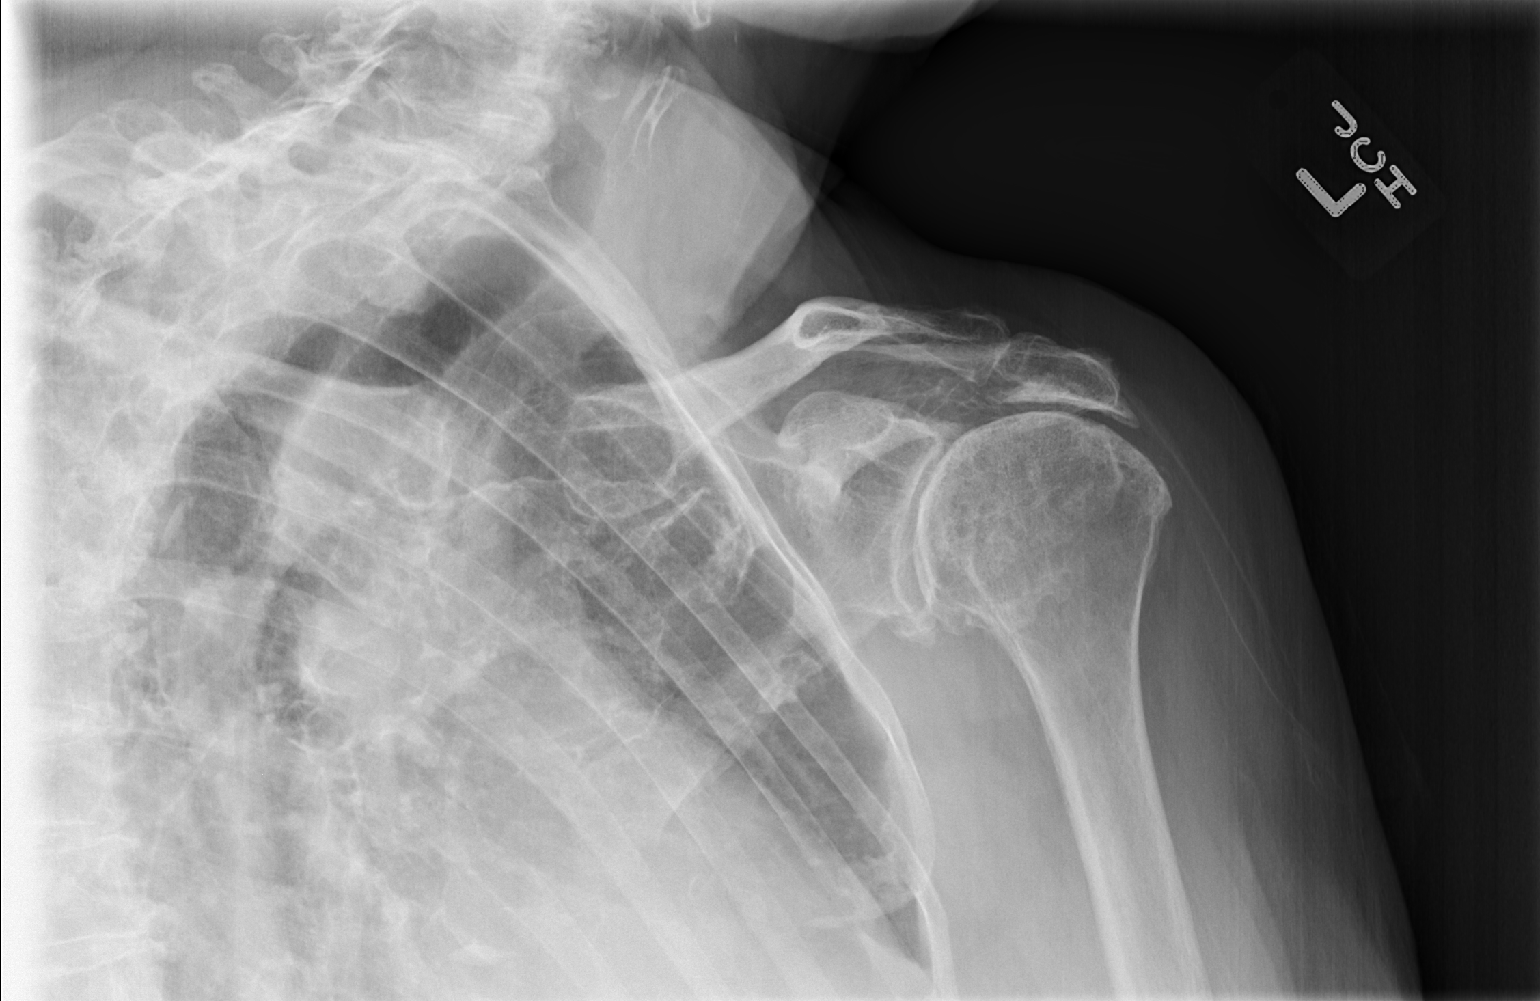

[w shoulder y view left *]
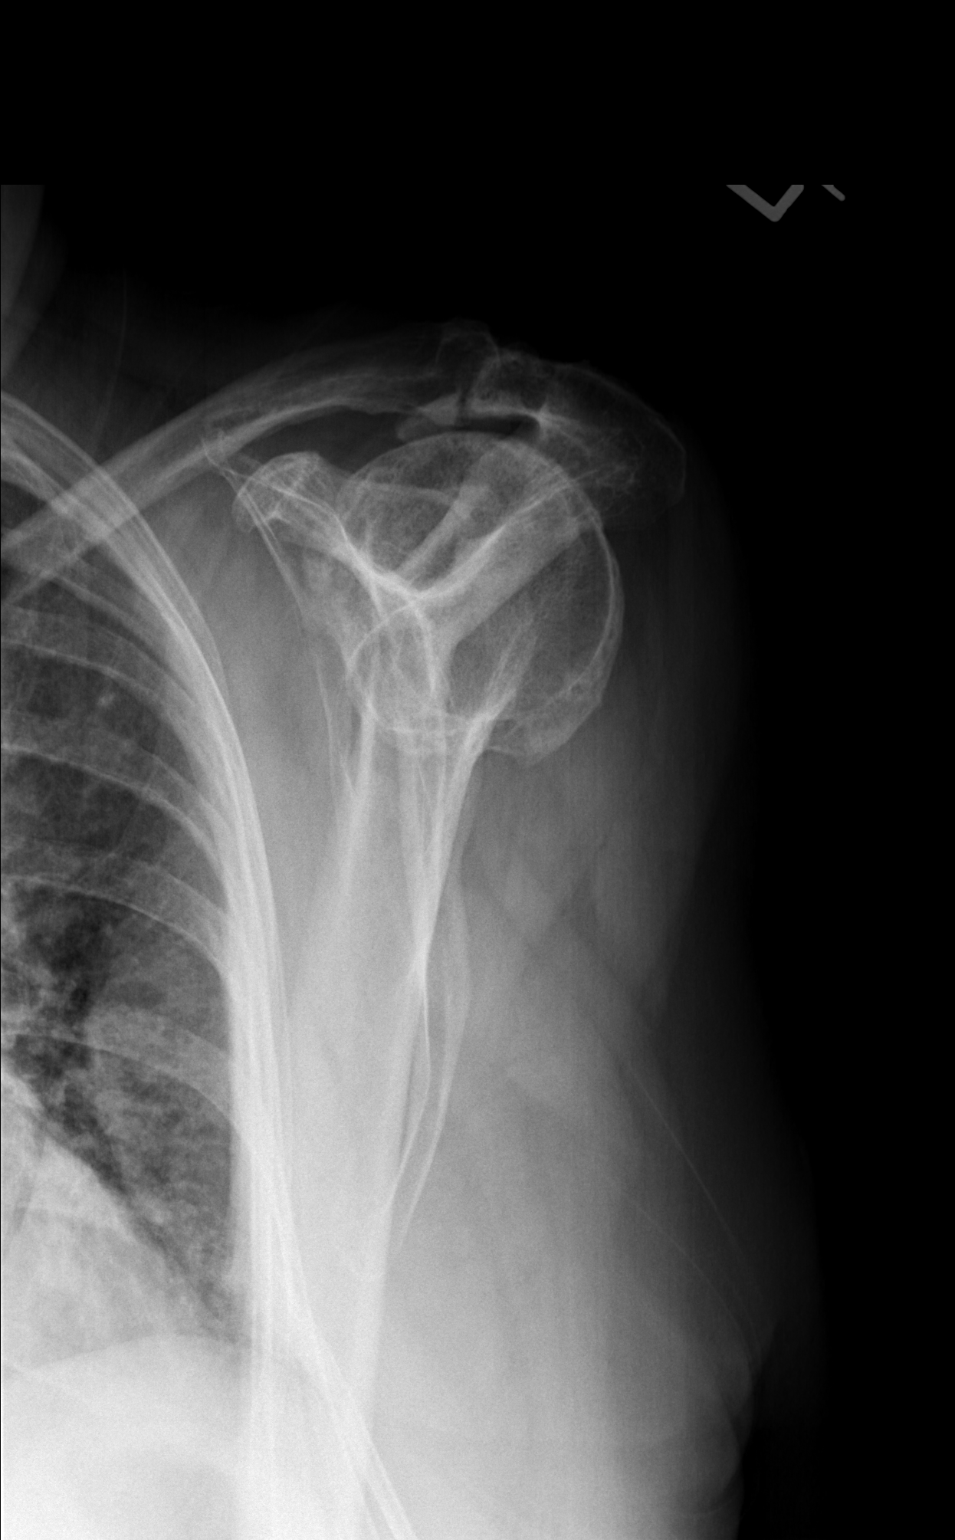

[x shoulder axillary left]
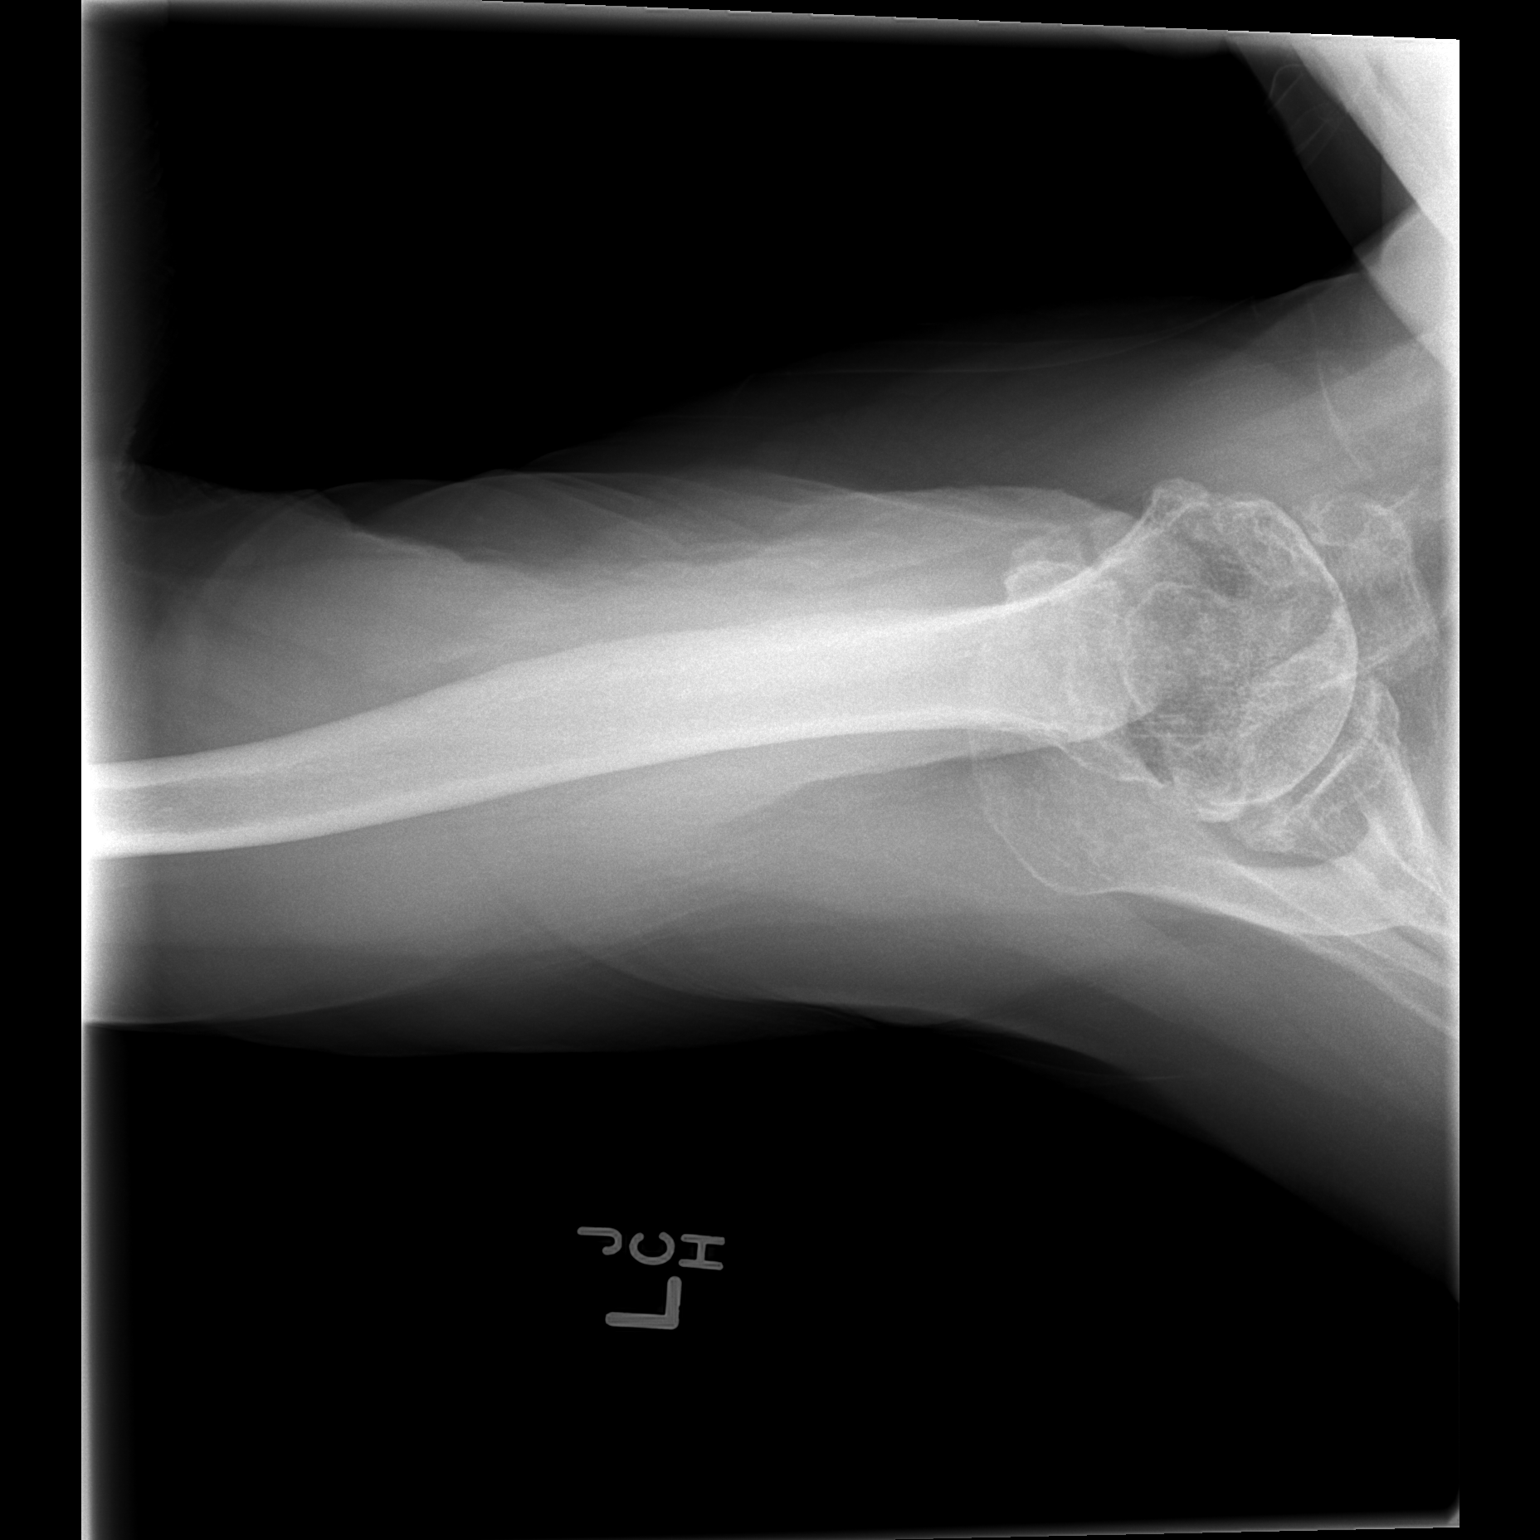

[3 of 3 positions shown; findings below may reference images not displayed]

FINDINGS: Advanced degenerative changes in the left AC joint and glenohumeral
joint. No acute bony abnormality. Specifically, no fracture,
subluxation, or dislocation. Soft tissues are intact.
IMPRESSION: Advanced degenerative changes. No acute findings.

## 2015-07-09 DIAGNOSIS — F330 Major depressive disorder, recurrent, mild: Secondary | ICD-10-CM | POA: Diagnosis not present

## 2015-07-09 DIAGNOSIS — F410 Panic disorder [episodic paroxysmal anxiety] without agoraphobia: Secondary | ICD-10-CM | POA: Diagnosis not present

## 2015-07-09 DIAGNOSIS — F341 Dysthymic disorder: Secondary | ICD-10-CM | POA: Diagnosis not present

## 2015-07-14 DIAGNOSIS — M19041 Primary osteoarthritis, right hand: Secondary | ICD-10-CM | POA: Diagnosis not present

## 2015-07-14 DIAGNOSIS — M19042 Primary osteoarthritis, left hand: Secondary | ICD-10-CM | POA: Diagnosis not present

## 2015-07-15 DIAGNOSIS — M47816 Spondylosis without myelopathy or radiculopathy, lumbar region: Secondary | ICD-10-CM | POA: Diagnosis not present

## 2015-07-15 DIAGNOSIS — G894 Chronic pain syndrome: Secondary | ICD-10-CM | POA: Diagnosis not present

## 2015-07-15 DIAGNOSIS — M2550 Pain in unspecified joint: Secondary | ICD-10-CM | POA: Diagnosis not present

## 2015-07-15 DIAGNOSIS — M069 Rheumatoid arthritis, unspecified: Secondary | ICD-10-CM | POA: Diagnosis not present

## 2015-07-22 ENCOUNTER — Ambulatory Visit (INDEPENDENT_AMBULATORY_CARE_PROVIDER_SITE_OTHER): Payer: Medicare Other | Admitting: Family Medicine

## 2015-07-22 ENCOUNTER — Encounter: Payer: Self-pay | Admitting: Family Medicine

## 2015-07-22 VITALS — BP 116/70 | HR 86 | Temp 97.5°F | Wt 157.0 lb

## 2015-07-22 DIAGNOSIS — J0100 Acute maxillary sinusitis, unspecified: Secondary | ICD-10-CM | POA: Diagnosis not present

## 2015-07-22 MED ORDER — PREDNISONE 20 MG PO TABS: ORAL_TABLET | ORAL | Status: AC

## 2015-07-22 NOTE — Progress Notes (Signed)
CC: [name] is a 81 y.o. female is here for Shortness of Breath; Dry Eye; dry mouth; and dry nose   Subjective: HPI:  For the past 3 weeks she's been experiencing facial pressure in the forehead and cheeks. It's been accompanied by nasal congestion and postnasal drip. She had similar symptoms last month however they improved after taking amoxicillin. The symptoms returned one week after stopping amoxicillin. She reports a mild degree of fatigue since the above symptoms started. She denies cough, wheezing or chest discomfort. She denies shortness of breath. No interventions other than above as of yet. Nothing seems to make symptoms better or worse. They're moderate in severity and present all hours of the day. She denies fevers, chills or confusion   Review Of Systems Outlined In HPI  Past Medical History  Diagnosis Date  . Asthma   . Depression   . GERD (gastroesophageal reflux disease)   . Arthritis     osteoarthritis  . Anxiety   . Osteopenia   . Candida esophagitis ([place])   . Diverticulosis   . Tubular adenoma of colon   . Osteoarthritis   . DJD (degenerative joint disease)      L5 compression fracture  . Gout   . Renal failure, unspecified   . Fibromyalgia   . C. difficile diarrhea   . Chronic sinusitis   . Panic disorder   . Spinal stenosis of lumbar region   . Hyperlipidemia   . Hypertension   . Edema extremities   . Depression     Past Surgical History  Procedure Laterality Date  . Total hip arthroplasty  2004    right  . Total knee arthroplasty  2006    right  . Carpal tunnel release  2007, 2009    Bilateral  . Shoulder surgery  2004, 07/30/10, 01/2011    after her right humeral neck fracture, revision hemiarthroplasty 2004 for persistent pain, revision reverse arthroplasty December 2011 for persistent pain, incision and drainage with poly-exchange 01/26/2011 for possible prosthetic joint infection.  . Total abdominal hysterectomy    . Ganglion cyst   wrist    . Ganglion cyct removal on fingers      right  . Tonsillectomy and adenoidectomy    . Nasal sinus surgery    . Total shoulder replacement  2002    right  . Knee arthroscopy      right  . Cataract extraction, bilateral    . Choanal adeniodectomy    . Adenoidectomy     Family History  Problem Relation Age of Onset  . Breast cancer    . Colon polyps    . Cancer Father     bladder  . Diabetes Mother   . Hypertension Mother   . Depression Son     Social History   Social History  . Marital Status: Married    Spouse Name: N/A  . Number of Children: N/A  . Years of Education: N/A   Occupational History  . Not on file.   Social History Main Topics  . Smoking status: Never Smoker   . Smokeless tobacco: Never Used  . Alcohol Use: No  . Drug Use: No  . Sexual Activity: Not on file   Other Topics Concern  . Not on file   Social History Narrative     Objective: BP 116/70 mmHg  Pulse 86  Temp(Src) 97.5 F (36.4 C) (Oral)  Wt 157 lb (71.215 kg)  SpO2 95%  General: Alert  and Oriented, No Acute Distress HEENT: Pupils equal, round, reactive to light. Conjunctivae clear.  External ears unremarkable, canals clear with intact TMs with appropriate landmarks.  Middle ear appears open without effusion. Pink inferior turbinates.  Moist mucous membranes, pharynx without inflammation nor lesions.  Neck supple without palpable lymphadenopathy nor abnormal masses. Lungs: Clear to auscultation bilaterally, no wheezing/ronchi/rales.  Comfortable work of breathing. Good air movement. Extremities: No peripheral edema.  Strong peripheral pulses.  Mental Status: No depression, anxiety, nor agitation. Skin: Warm and dry.  Assessment & Plan: [name] was seen today for shortness of breath, dry eye, dry mouth and dry nose.  Diagnoses and all orders for this visit:  Subacute maxillary sinusitis -     predniSONE (DELTASONE) 20 MG tablet; Three tabs daily days 1-3, two tabs daily  days 4-6, one tab daily days 7-9, half tab daily days 10-13.   Start prednisone taper consider nasal saline washes. Call on Friday if no better and will look into an antibiotic that she can tolerate.  25 minutes spent face-to-face during visit today of which at least 50% was counseling or coordinating care regarding: 1. Subacute maxillary sinusitis       Return if symptoms worsen or fail to improve.

## 2015-08-12 ENCOUNTER — Other Ambulatory Visit: Payer: Self-pay

## 2015-08-12 DIAGNOSIS — Z79891 Long term (current) use of opiate analgesic: Secondary | ICD-10-CM | POA: Diagnosis not present

## 2015-08-12 DIAGNOSIS — M2550 Pain in unspecified joint: Secondary | ICD-10-CM | POA: Diagnosis not present

## 2015-08-12 DIAGNOSIS — G894 Chronic pain syndrome: Secondary | ICD-10-CM | POA: Diagnosis not present

## 2015-08-12 DIAGNOSIS — M47816 Spondylosis without myelopathy or radiculopathy, lumbar region: Secondary | ICD-10-CM | POA: Diagnosis not present

## 2015-08-12 DIAGNOSIS — M069 Rheumatoid arthritis, unspecified: Secondary | ICD-10-CM | POA: Diagnosis not present

## 2015-08-12 MED ORDER — SIMVASTATIN 10 MG PO TABS: 10.0000 mg | ORAL_TABLET | Freq: Every day | ORAL | Status: DC

## 2015-08-14 ENCOUNTER — Ambulatory Visit (INDEPENDENT_AMBULATORY_CARE_PROVIDER_SITE_OTHER): Payer: Medicare Other | Admitting: Sports Medicine

## 2015-08-14 VITALS — BP 122/64 | HR 85 | Temp 98.6°F | Resp 18 | Wt 160.7 lb

## 2015-08-14 DIAGNOSIS — M1712 Unilateral primary osteoarthritis, left knee: Secondary | ICD-10-CM | POA: Diagnosis not present

## 2015-08-14 DIAGNOSIS — L304 Erythema intertrigo: Secondary | ICD-10-CM | POA: Diagnosis not present

## 2015-08-14 MED ORDER — CLOTRIMAZOLE-BETAMETHASONE 1-0.05 % EX CREA
1.0000 | TOPICAL_CREAM | Freq: Two times a day (BID) | CUTANEOUS | Status: DC
Start: 2015-08-14 — End: 2016-01-13

## 2015-08-14 NOTE — Progress Notes (Signed)
  Subjective:    CC: Left knee pain  HPI: This is a pleasant 82 year old female with known left knee osteoarthritis, 6 months ago we did a series of Orthovisc and she responded well, now having recurrence of pain, moderate, persistent. She desires repeat interventional treatment today, no mechanical symptoms, mild swelling.  Skin rash: Present under the right breast, pruritic.  Past medical history, Surgical history, Family history not pertinant except as noted below, Social history, Allergies, and medications have been entered into the medical record, reviewed, and no changes needed.   Review of Systems: No fevers, chills, night sweats, weight loss, chest pain, or shortness of breath.   Objective:    General: Well Developed, well nourished, and in no acute distress.  Neuro: Alert and oriented x3, extra-ocular muscles intact, sensation grossly intact.  HEENT: Normocephalic, atraumatic, pupils equal round reactive to light, neck supple, no masses, no lymphadenopathy, thyroid nonpalpable.  Skin: Warm and dry, no rashes. There does appear to be an intertrigo under the right breast. Cardiac: Regular rate and rhythm, no murmurs rubs or gallops, no lower extremity edema.  Respiratory: Clear to auscultation bilaterally. Not using accessory muscles, speaking in full sentences. Left Knee: Visible and palpable effusion with tenderness at the medial joint line. ROM normal in flexion and extension and lower leg rotation. Ligaments with solid consistent endpoints including ACL, PCL, LCL, MCL. Negative Mcmurray's and provocative meniscal tests. Non painful patellar compression. Patellar and quadriceps tendons unremarkable. Hamstring and quadriceps strength is normal.  Procedure: Real-time Ultrasound Guided Injection of left knee Device: GE Logiq E  Verbal informed consent obtained.  Time-out conducted.  Noted no overlying erythema, induration, or other signs of local infection.  Skin prepped in  a sterile fashion.  Local anesthesia: Topical Ethyl chloride.  With sterile technique and under real time ultrasound guidance:  2 mL kenalog 40, 2 mL lidocaine, 2 mL Marcaine injected easily, syringe switched and 30 mg/2 mL of OrthoVisc (sodium hyaluronate) in a prefilled syringe was injected easily into the knee through a 22-gauge needle. Completed without difficulty  Pain immediately resolved suggesting accurate placement of the medication.  Advised to call if fevers/chills, erythema, induration, drainage, or persistent bleeding.  Images permanently stored and available for review in the ultrasound unit.  Impression: Technically successful ultrasound guided injection.  Impression and Recommendations:

## 2015-08-14 NOTE — Assessment & Plan Note (Signed)
6 months since last Orthovisc series, did well, repeat today, with a steroid injection and Orthovisc, return in one week for injection #2 of 4 into the left knee.

## 2015-08-14 NOTE — Assessment & Plan Note (Signed)
Adding topical Lotrisone 

## 2015-08-17 ENCOUNTER — Other Ambulatory Visit: Payer: Self-pay

## 2015-08-17 DIAGNOSIS — J302 Other seasonal allergic rhinitis: Secondary | ICD-10-CM

## 2015-08-17 DIAGNOSIS — R0981 Nasal congestion: Secondary | ICD-10-CM

## 2015-08-17 MED ORDER — MONTELUKAST SODIUM 10 MG PO TABS: 10.0000 mg | ORAL_TABLET | Freq: Every day | ORAL | Status: DC

## 2015-08-18 DIAGNOSIS — F341 Dysthymic disorder: Secondary | ICD-10-CM | POA: Diagnosis not present

## 2015-08-18 DIAGNOSIS — F330 Major depressive disorder, recurrent, mild: Secondary | ICD-10-CM | POA: Diagnosis not present

## 2015-08-18 DIAGNOSIS — F410 Panic disorder [episodic paroxysmal anxiety] without agoraphobia: Secondary | ICD-10-CM | POA: Diagnosis not present

## 2015-08-21 ENCOUNTER — Ambulatory Visit (INDEPENDENT_AMBULATORY_CARE_PROVIDER_SITE_OTHER): Payer: Medicare Other | Admitting: Sports Medicine

## 2015-08-21 VITALS — BP 123/64 | HR 96 | Temp 97.9°F | Resp 18 | Wt 156.4 lb

## 2015-08-21 DIAGNOSIS — M1712 Unilateral primary osteoarthritis, left knee: Secondary | ICD-10-CM | POA: Diagnosis not present

## 2015-08-21 NOTE — Assessment & Plan Note (Signed)
Overall doing okay, Orthovisc injection #2 of 4 into the left knee today. Return in one week for #3 of 4.

## 2015-08-21 NOTE — Progress Notes (Signed)

## 2015-08-27 ENCOUNTER — Ambulatory Visit (INDEPENDENT_AMBULATORY_CARE_PROVIDER_SITE_OTHER): Payer: Medicare Other | Admitting: Family Medicine

## 2015-08-27 ENCOUNTER — Ambulatory Visit (INDEPENDENT_AMBULATORY_CARE_PROVIDER_SITE_OTHER): Payer: Medicare Other | Admitting: Sports Medicine

## 2015-08-27 ENCOUNTER — Encounter: Payer: Self-pay | Admitting: Family Medicine

## 2015-08-27 ENCOUNTER — Encounter: Payer: Self-pay | Admitting: Sports Medicine

## 2015-08-27 VITALS — BP 120/70 | HR 84 | Wt 159.0 lb

## 2015-08-27 DIAGNOSIS — K219 Gastro-esophageal reflux disease without esophagitis: Secondary | ICD-10-CM | POA: Diagnosis not present

## 2015-08-27 DIAGNOSIS — I10 Essential (primary) hypertension: Secondary | ICD-10-CM

## 2015-08-27 DIAGNOSIS — M1712 Unilateral primary osteoarthritis, left knee: Secondary | ICD-10-CM

## 2015-08-27 MED ORDER — AMLODIPINE BESYLATE 10 MG PO TABS: 10.0000 mg | ORAL_TABLET | Freq: Every day | ORAL | Status: DC

## 2015-08-27 MED ORDER — HYDROCHLOROTHIAZIDE 25 MG PO TABS: 25.0000 mg | ORAL_TABLET | Freq: Every day | ORAL | Status: DC

## 2015-08-27 MED ORDER — OMEPRAZOLE 20 MG PO CPDR: 20.0000 mg | DELAYED_RELEASE_CAPSULE | Freq: Every day | ORAL | Status: DC

## 2015-08-27 NOTE — Addendum Note (Signed)
Addended by: Elizabeth Sauer on: 08/27/2015 08:51 AM   Modules accepted: Medications

## 2015-08-27 NOTE — Progress Notes (Signed)

## 2015-08-27 NOTE — Assessment & Plan Note (Signed)
Orthovisc injection #3 of 4, return in one week for #4.

## 2015-08-27 NOTE — Progress Notes (Signed)
CC: [name] is a 82 y.o. female is here for Hypertension and Medication Refill   Subjective: HPI:  HTN: Tolerating amlodipine and hctz on a daily basis without any side effects.  No outside blood pressures to report.  Denies chest pain , sob, lightheadedness, edema, or orthopnea.  Requesting refills on omeprazole.  If she skipps a few doses she gets a return of her epigastric discomfort.  Nothing else makes better or worse.  No nausea or diarrhea.   Review Of Systems Outlined In HPI  Past Medical History  Diagnosis Date  . Asthma   . Depression   . GERD (gastroesophageal reflux disease)   . Arthritis     osteoarthritis  . Anxiety   . Osteopenia   . Candida esophagitis ([place])   . Diverticulosis   . Tubular adenoma of colon   . Osteoarthritis   . DJD (degenerative joint disease)      L5 compression fracture  . Gout   . Renal failure, unspecified   . Fibromyalgia   . C. difficile diarrhea   . Chronic sinusitis   . Panic disorder   . Spinal stenosis of lumbar region   . Hyperlipidemia   . Hypertension   . Edema extremities   . Depression     Past Surgical History  Procedure Laterality Date  . Total hip arthroplasty  2004    right  . Total knee arthroplasty  2006    right  . Carpal tunnel release  2007, 2009    Bilateral  . Shoulder surgery  2004, 07/30/10, 01/2011    after her right humeral neck fracture, revision hemiarthroplasty 2004 for persistent pain, revision reverse arthroplasty December 2011 for persistent pain, incision and drainage with poly-exchange 01/26/2011 for possible prosthetic joint infection.  . Total abdominal hysterectomy    . Ganglion cyst  wrist    . Ganglion cyct removal on fingers      right  . Tonsillectomy and adenoidectomy    . Nasal sinus surgery    . Total shoulder replacement  2002    right  . Knee arthroscopy      right  . Cataract extraction, bilateral    . Choanal adeniodectomy    . Adenoidectomy     Family History   Problem Relation Age of Onset  . Breast cancer    . Colon polyps    . Cancer Father     bladder  . Diabetes Mother   . Hypertension Mother   . Depression Son     Social History   Social History  . Marital Status: Married    Spouse Name: N/A  . Number of Children: N/A  . Years of Education: N/A   Occupational History  . Not on file.   Social History Main Topics  . Smoking status: Never Smoker   . Smokeless tobacco: Never Used  . Alcohol Use: No  . Drug Use: No  . Sexual Activity: Not on file   Other Topics Concern  . Not on file   Social History Narrative     Objective: BP 120/70 mmHg  Pulse 84  Wt 159 lb (72.122 kg)  General: Alert and Oriented, No Acute Distress HEENT: Pupils equal, round, reactive to light. Conjunctivae clear.  Moist mucous membranes Lungs: Clear to auscultation bilaterally, no wheezing/ronchi/rales.  Comfortable work of breathing. Good air movement. Cardiac: Regular rate and rhythm. Normal S1/S2.  No murmurs, rubs, nor gallops.   Extremities: No peripheral edema.  Strong peripheral  pulses.  Mental Status: No depression, anxiety, nor agitation. Skin: Warm and dry.  Assessment & Plan: [name] was seen today for hypertension and medication refill.  Diagnoses and all orders for this visit:  Essential hypertension  Gastroesophageal reflux disease without esophagitis  Other orders -     omeprazole (PRILOSEC) 20 MG capsule; Take 1 capsule (20 mg total) by mouth daily. -     amLODipine (NORVASC) 10 MG tablet; Take 1 tablet (10 mg total) by mouth daily. -     hydrochlorothiazide (HYDRODIURIL) 25 MG tablet; Take 1 tablet (25 mg total) by mouth daily.   HTN: Controlled continue amlodipine and hctz GERD: controlled with prilosec  Return in about 6 months (around 02/24/2016) for BP Monitoring.

## 2015-08-28 ENCOUNTER — Ambulatory Visit: Payer: Self-pay | Admitting: Sports Medicine

## 2015-09-04 ENCOUNTER — Encounter: Payer: Self-pay | Admitting: Sports Medicine

## 2015-09-04 ENCOUNTER — Ambulatory Visit (INDEPENDENT_AMBULATORY_CARE_PROVIDER_SITE_OTHER): Payer: Medicare Other | Admitting: Sports Medicine

## 2015-09-04 DIAGNOSIS — M1712 Unilateral primary osteoarthritis, left knee: Secondary | ICD-10-CM

## 2015-09-04 MED ORDER — DICLOFENAC SODIUM 2 % TD SOLN
2.0000 | Freq: Two times a day (BID) | TRANSDERMAL | Status: DC
Start: 2015-09-04 — End: 2015-11-05

## 2015-09-04 NOTE — Assessment & Plan Note (Signed)
Orthovisc injection #4 into the left knee, still has some pain, Pennsaid samples given. Return in one month.

## 2015-09-04 NOTE — Progress Notes (Signed)
  Procedure: Real-time Ultrasound Guided Injection of left knee Device: GE Logiq E  Verbal informed consent obtained.  Time-out conducted.  Noted no overlying erythema, induration, or other signs of local infection.  Skin prepped in a sterile fashion.  Local anesthesia: Topical Ethyl chloride.  With sterile technique and under real time ultrasound guidance:   Aspirated 10 mL straw-colored fluid, syringe switched and 30 mg/2 mL of OrthoVisc (sodium hyaluronate) in a prefilled syringe was injected easily into the knee through a 22-gauge needle. Completed without difficulty  Pain immediately resolved suggesting accurate placement of the medication.  Advised to call if fevers/chills, erythema, induration, drainage, or persistent bleeding.  Images permanently stored and available for review in the ultrasound unit.  Impression: Technically successful ultrasound guided injection.

## 2015-09-09 DIAGNOSIS — F341 Dysthymic disorder: Secondary | ICD-10-CM | POA: Diagnosis not present

## 2015-09-09 DIAGNOSIS — G894 Chronic pain syndrome: Secondary | ICD-10-CM | POA: Diagnosis not present

## 2015-09-09 DIAGNOSIS — F410 Panic disorder [episodic paroxysmal anxiety] without agoraphobia: Secondary | ICD-10-CM | POA: Diagnosis not present

## 2015-09-09 DIAGNOSIS — M2550 Pain in unspecified joint: Secondary | ICD-10-CM | POA: Diagnosis not present

## 2015-09-09 DIAGNOSIS — M47816 Spondylosis without myelopathy or radiculopathy, lumbar region: Secondary | ICD-10-CM | POA: Diagnosis not present

## 2015-09-09 DIAGNOSIS — M069 Rheumatoid arthritis, unspecified: Secondary | ICD-10-CM | POA: Diagnosis not present

## 2015-09-09 DIAGNOSIS — F330 Major depressive disorder, recurrent, mild: Secondary | ICD-10-CM | POA: Diagnosis not present

## 2015-10-02 ENCOUNTER — Ambulatory Visit: Payer: Self-pay | Admitting: Sports Medicine

## 2015-10-05 ENCOUNTER — Other Ambulatory Visit: Payer: Self-pay

## 2015-10-05 MED ORDER — SIMVASTATIN 10 MG PO TABS: 10.0000 mg | ORAL_TABLET | Freq: Every day | ORAL | Status: DC

## 2015-10-08 DIAGNOSIS — F331 Major depressive disorder, recurrent, moderate: Secondary | ICD-10-CM | POA: Diagnosis not present

## 2015-10-08 DIAGNOSIS — M47816 Spondylosis without myelopathy or radiculopathy, lumbar region: Secondary | ICD-10-CM | POA: Diagnosis not present

## 2015-10-08 DIAGNOSIS — M069 Rheumatoid arthritis, unspecified: Secondary | ICD-10-CM | POA: Diagnosis not present

## 2015-10-08 DIAGNOSIS — F341 Dysthymic disorder: Secondary | ICD-10-CM | POA: Diagnosis not present

## 2015-10-08 DIAGNOSIS — G894 Chronic pain syndrome: Secondary | ICD-10-CM | POA: Diagnosis not present

## 2015-10-08 DIAGNOSIS — F410 Panic disorder [episodic paroxysmal anxiety] without agoraphobia: Secondary | ICD-10-CM | POA: Diagnosis not present

## 2015-10-08 DIAGNOSIS — M2550 Pain in unspecified joint: Secondary | ICD-10-CM | POA: Diagnosis not present

## 2015-11-05 ENCOUNTER — Emergency Department (HOSPITAL_COMMUNITY): Payer: Medicare Other | Admitting: Anesthesiology

## 2015-11-05 ENCOUNTER — Emergency Department (HOSPITAL_COMMUNITY): Payer: Medicare Other

## 2015-11-05 ENCOUNTER — Encounter (HOSPITAL_COMMUNITY): Payer: Self-pay

## 2015-11-05 ENCOUNTER — Encounter (HOSPITAL_COMMUNITY)
Admission: EM | Disposition: A | Discharge status: Home / Self Care | Payer: Self-pay | Source: Home / Self Care | Attending: Emergency Medicine

## 2015-11-05 ENCOUNTER — Emergency Department (HOSPITAL_COMMUNITY)
Admission: EM | Admit: 2015-11-05 | Discharge: 2015-11-05 | Disposition: A | Discharge status: Home / Self Care | Payer: Medicare Other | Attending: Emergency Medicine | Admitting: Emergency Medicine

## 2015-11-05 DIAGNOSIS — Z79899 Other long term (current) drug therapy: Secondary | ICD-10-CM | POA: Diagnosis not present

## 2015-11-05 DIAGNOSIS — M069 Rheumatoid arthritis, unspecified: Secondary | ICD-10-CM | POA: Diagnosis not present

## 2015-11-05 DIAGNOSIS — M542 Cervicalgia: Secondary | ICD-10-CM | POA: Diagnosis not present

## 2015-11-05 DIAGNOSIS — T18128A Food in esophagus causing other injury, initial encounter: Secondary | ICD-10-CM | POA: Diagnosis not present

## 2015-11-05 DIAGNOSIS — R05 Cough: Secondary | ICD-10-CM | POA: Diagnosis not present

## 2015-11-05 DIAGNOSIS — Z96641 Presence of right artificial hip joint: Secondary | ICD-10-CM | POA: Insufficient documentation

## 2015-11-05 DIAGNOSIS — X58XXXA Exposure to other specified factors, initial encounter: Secondary | ICD-10-CM | POA: Diagnosis not present

## 2015-11-05 DIAGNOSIS — M797 Fibromyalgia: Secondary | ICD-10-CM | POA: Insufficient documentation

## 2015-11-05 DIAGNOSIS — E785 Hyperlipidemia, unspecified: Secondary | ICD-10-CM | POA: Diagnosis not present

## 2015-11-05 DIAGNOSIS — K222 Esophageal obstruction: Secondary | ICD-10-CM | POA: Diagnosis not present

## 2015-11-05 DIAGNOSIS — F419 Anxiety disorder, unspecified: Secondary | ICD-10-CM | POA: Insufficient documentation

## 2015-11-05 DIAGNOSIS — R1310 Dysphagia, unspecified: Secondary | ICD-10-CM | POA: Diagnosis not present

## 2015-11-05 DIAGNOSIS — M109 Gout, unspecified: Secondary | ICD-10-CM | POA: Insufficient documentation

## 2015-11-05 DIAGNOSIS — I10 Essential (primary) hypertension: Secondary | ICD-10-CM | POA: Insufficient documentation

## 2015-11-05 DIAGNOSIS — M47816 Spondylosis without myelopathy or radiculopathy, lumbar region: Secondary | ICD-10-CM | POA: Diagnosis not present

## 2015-11-05 DIAGNOSIS — T18108A Unspecified foreign body in esophagus causing other injury, initial encounter: Secondary | ICD-10-CM | POA: Diagnosis not present

## 2015-11-05 DIAGNOSIS — K219 Gastro-esophageal reflux disease without esophagitis: Secondary | ICD-10-CM | POA: Insufficient documentation

## 2015-11-05 DIAGNOSIS — Z96611 Presence of right artificial shoulder joint: Secondary | ICD-10-CM | POA: Diagnosis not present

## 2015-11-05 DIAGNOSIS — J45909 Unspecified asthma, uncomplicated: Secondary | ICD-10-CM | POA: Insufficient documentation

## 2015-11-05 DIAGNOSIS — Z96651 Presence of right artificial knee joint: Secondary | ICD-10-CM | POA: Diagnosis not present

## 2015-11-05 DIAGNOSIS — G894 Chronic pain syndrome: Secondary | ICD-10-CM | POA: Diagnosis not present

## 2015-11-05 DIAGNOSIS — F329 Major depressive disorder, single episode, unspecified: Secondary | ICD-10-CM | POA: Diagnosis not present

## 2015-11-05 DIAGNOSIS — G4700 Insomnia, unspecified: Secondary | ICD-10-CM | POA: Diagnosis not present

## 2015-11-05 HISTORY — PX: DIAGNOSTIC LARYNGOSCOPY: SHX5368

## 2015-11-05 LAB — BASIC METABOLIC PANEL
ANION GAP: 12 (ref 5–15)
BUN: 11 mg/dL (ref 6–20)
CALCIUM: 9.9 mg/dL (ref 8.9–10.3)
CO2: 27 mmol/L (ref 22–32)
Chloride: 99 mmol/L — ABNORMAL LOW (ref 101–111)
Creatinine, Ser: 1.33 mg/dL — ABNORMAL HIGH (ref 0.44–1.00)
GFR calc Af Amer: 43 mL/min — ABNORMAL LOW (ref >60)
GFR calc non Af Amer: 37 mL/min — ABNORMAL LOW (ref >60)
GLUCOSE: 142 mg/dL — AB (ref 65–99)
Potassium: 3.9 mmol/L (ref 3.5–5.1)
Sodium: 138 mmol/L (ref 135–145)

## 2015-11-05 LAB — CBC
HCT: 44.3 % (ref 36.0–46.0)
Hemoglobin: 14.6 g/dL (ref 12.0–15.0)
MCH: 30.1 pg (ref 26.0–34.0)
MCHC: 33 g/dL (ref 30.0–36.0)
MCV: 91.3 fL (ref 78.0–100.0)
PLATELETS: 194 10*3/uL (ref 150–400)
RBC: 4.85 MIL/uL (ref 3.87–5.11)
RDW: 13.6 % (ref 11.5–15.5)
WBC: 9.3 10*3/uL (ref 4.0–10.5)

## 2015-11-05 SURGERY — LARYNGOSCOPY, DIAGNOSTIC
Anesthesia: General | Site: Throat

## 2015-11-05 MED ORDER — DEXAMETHASONE SODIUM PHOSPHATE 10 MG/ML IJ SOLN
INTRAMUSCULAR | Status: AC
  Filled 2015-11-05: qty 1

## 2015-11-05 MED ORDER — PROPOFOL 10 MG/ML IV BOLUS
INTRAVENOUS | Status: DC | PRN
  Administered 2015-11-05: 90 mg via INTRAVENOUS

## 2015-11-05 MED ORDER — PROPOFOL 10 MG/ML IV BOLUS
INTRAVENOUS | Status: AC
  Filled 2015-11-05: qty 20

## 2015-11-05 MED ORDER — LACTATED RINGERS IV SOLN
INTRAVENOUS | Status: DC | PRN
  Administered 2015-11-05: 19:00:00 via INTRAVENOUS

## 2015-11-05 MED ORDER — FENTANYL CITRATE (PF) 100 MCG/2ML IJ SOLN
INTRAMUSCULAR | Status: DC | PRN
  Administered 2015-11-05 (×2): 50 ug via INTRAVENOUS

## 2015-11-05 MED ORDER — SUCCINYLCHOLINE CHLORIDE 20 MG/ML IJ SOLN
INTRAMUSCULAR | Status: DC | PRN
  Administered 2015-11-05: 100 mg via INTRAVENOUS

## 2015-11-05 MED ORDER — LACTATED RINGERS IV SOLN: INTRAVENOUS | Status: DC

## 2015-11-05 MED ORDER — LIDOCAINE HCL (CARDIAC) 20 MG/ML IV SOLN
INTRAVENOUS | Status: DC | PRN
  Administered 2015-11-05: 50 mg via INTRAVENOUS

## 2015-11-05 MED ORDER — FENTANYL CITRATE (PF) 100 MCG/2ML IJ SOLN
INTRAMUSCULAR | Status: AC
  Filled 2015-11-05: qty 2

## 2015-11-05 MED ORDER — ONDANSETRON HCL 4 MG/2ML IJ SOLN
INTRAMUSCULAR | Status: DC | PRN
  Administered 2015-11-05: 4 mg via INTRAVENOUS

## 2015-11-05 MED ORDER — MIDAZOLAM HCL 2 MG/2ML IJ SOLN
INTRAMUSCULAR | Status: AC
  Filled 2015-11-05: qty 2

## 2015-11-05 MED ORDER — MIDAZOLAM HCL 2 MG/2ML IJ SOLN
INTRAMUSCULAR | Status: DC | PRN
  Administered 2015-11-05: 2 mg via INTRAVENOUS

## 2015-11-05 MED ORDER — 0.9 % SODIUM CHLORIDE (POUR BTL) OPTIME
TOPICAL | Status: DC | PRN
  Administered 2015-11-05: 1000 mL

## 2015-11-05 MED ORDER — LIDOCAINE HCL (CARDIAC) 20 MG/ML IV SOLN
INTRAVENOUS | Status: AC
  Filled 2015-11-05: qty 5

## 2015-11-05 SURGICAL SUPPLY — 17 items
BAG SPEC THK2 15X12 ZIP CLS (MISCELLANEOUS) ×1
BAG ZIPLOCK 12X15 (MISCELLANEOUS) ×3 IMPLANT
DISSECTOR ROUND CHERRY 3/8 STR (MISCELLANEOUS) ×1 IMPLANT
DRAPE BACK TABLE (DRAPES) ×3 IMPLANT
DRAPE SHEET LG 3/4 BI-LAMINATE (DRAPES) ×3 IMPLANT
GAUZE SPONGE 4X4 12PLY STRL (GAUZE/BANDAGES/DRESSINGS) ×3 IMPLANT
KIT BASIN OR (CUSTOM PROCEDURE TRAY) ×3 IMPLANT
NS IRRIG 1000ML POUR BTL (IV SOLUTION) ×3 IMPLANT
PACK EENT SPLIT (PACKS) ×2 IMPLANT
POSITIONER SURGICAL ARM (MISCELLANEOUS) ×6 IMPLANT
SURGILUBE 3G PEEL PACK STRL (MISCELLANEOUS) ×3 IMPLANT
SYR 20CC LL (SYRINGE) ×3 IMPLANT
SYR 30ML LL (SYRINGE) ×2 IMPLANT
TRAP SPECIMEN MUCOUS 40CC (MISCELLANEOUS) ×2 IMPLANT
TUBING CONNECTING 10 (TUBING) ×3 IMPLANT
TUBING CONNECTING 10' (TUBING) ×2
WATER STERILE IRR 1500ML POUR (IV SOLUTION) ×3 IMPLANT

## 2015-11-05 NOTE — Anesthesia Procedure Notes (Signed)
Procedure Name: Intubation Date/Time: 11/05/2015 7:09 PM Performed by: Anne Fu Pre-anesthesia Checklist: Patient identified, Emergency Drugs available, Suction available, Patient being monitored and Timeout performed Patient Re-evaluated:Patient Re-evaluated prior to inductionOxygen Delivery Method: Circle system utilized Preoxygenation: Pre-oxygenation with 100% oxygen Intubation Type: IV induction, Rapid sequence and Cricoid Pressure applied Laryngoscope Size: Mac and 4 Grade View: Grade I Tube type: Oral Tube size: 6.0 mm Number of attempts: 1 Airway Equipment and Method: Stylet Placement Confirmation: ETT inserted through vocal cords under direct vision,  positive ETCO2,  CO2 detector and breath sounds checked- equal and bilateral Secured at: 18 cm Tube secured with: Tape Dental Injury: Teeth and Oropharynx as per pre-operative assessment

## 2015-11-05 NOTE — Brief Op Note (Signed)
11/05/2015  7:49 PM  PATIENT:  [name]  82 y.o. female  PRE-OPERATIVE DIAGNOSIS:  ESOPHAGEAL OBSTRUCTION  POST-OPERATIVE DIAGNOSIS:  ESOPHAGEAL OBSTRUCTION  PROCEDURE:  Procedure(s): DIAGNOSTIC LARYNGOSCOPY AND ESOPHAGOSCOPY (N/A) with removal of food impaction  SURGEON:  Surgeon(s) and Role:    * Rozetta Nunnery, MD - Primary  PHYSICIAN ASSISTANT:   ASSISTANTS: none   ANESTHESIA:   general  EBL:     BLOOD ADMINISTERED:none  DRAINS: none   LOCAL MEDICATIONS USED:  NONE  SPECIMEN:  No Specimen  DISPOSITION OF SPECIMEN:  N/A  COUNTS:  YES  TOURNIQUET:  * No tourniquets in log *  DICTATION: .Other Dictation: Dictation Number [id number]  PLAN OF CARE: Discharge to home after PACU  PATIENT DISPOSITION:  PACU - hemodynamically stable.   Delay start of Pharmacological VTE agent (>24hrs) due to surgical blood loss or risk of bleeding: no

## 2015-11-05 NOTE — ED Notes (Signed)
Patient states she began choking on a roast beef sandwich while eating lunch approx 45 min ago.  Patient states she feels "bubbling" when breathing.  Patient states she feels there may be a small amount of food stuck in her throat.

## 2015-11-05 NOTE — Consult Note (Signed)
PREOPERATIVE H&P  Chief Complaint: obstructed swallowing  HPI: [name] is a 82 y.o. female who presents for evaluation of esophageal obstruction. She was eating a roast beef sandwich earlier today and couldn't swallow. She presented to the ER with inability to swallow water which caused coughing. She had no airway problems. GI was consulted and referred patient to ENT. She's taken to the ER for direct laryngoscopy and esophagoscopy to remove presumed food impaction.  Past Medical History  Diagnosis Date  . Asthma   . Depression   . GERD (gastroesophageal reflux disease)   . Arthritis     osteoarthritis  . Anxiety   . Osteopenia   . Candida esophagitis ([place])   . Diverticulosis   . Tubular adenoma of colon   . Osteoarthritis   . DJD (degenerative joint disease)      L5 compression fracture  . Gout   . Renal failure, unspecified   . Fibromyalgia   . C. difficile diarrhea   . Chronic sinusitis   . Panic disorder   . Spinal stenosis of lumbar region   . Hyperlipidemia   . Hypertension   . Edema extremities   . Depression    Past Surgical History  Procedure Laterality Date  . Total hip arthroplasty  2004    right  . Total knee arthroplasty  2006    right  . Carpal tunnel release  2007, 2009    Bilateral  . Shoulder surgery  2004, 07/30/10, 01/2011    after her right humeral neck fracture, revision hemiarthroplasty 2004 for persistent pain, revision reverse arthroplasty December 2011 for persistent pain, incision and drainage with poly-exchange 01/26/2011 for possible prosthetic joint infection.  . Total abdominal hysterectomy    . Ganglion cyst  wrist    . Ganglion cyct removal on fingers      right  . Tonsillectomy and adenoidectomy    . Nasal sinus surgery    . Total shoulder replacement  2002    right  . Knee arthroscopy      right  . Cataract extraction, bilateral    . Choanal adeniodectomy    . Adenoidectomy     Social History   Social History  .  Marital Status: Married    Spouse Name: N/A  . Number of Children: N/A  . Years of Education: N/A   Social History Main Topics  . Smoking status: Never Smoker   . Smokeless tobacco: Never Used  . Alcohol Use: No  . Drug Use: No  . Sexual Activity: No   Other Topics Concern  . None   Social History Narrative   Family History  Problem Relation Age of Onset  . Breast cancer    . Colon polyps    . Cancer Father     bladder  . Diabetes Mother   . Hypertension Mother   . Depression Son    Allergies  Allergen Reactions  . Bee Venom Anaphylaxis    Has epi-pen  . Ceftin [Cefuroxime Axetil] Anaphylaxis  . Ciprofloxacin Anaphylaxis and Palpitations  . Ephedrine Other (See Comments) and Palpitations    "knocks me out"  . Sulfonamide Derivatives Anaphylaxis  . Telithromycin Anaphylaxis    Reaction: also blurred vision   . Valsartan Anaphylaxis  . Verapamil Anaphylaxis  . Venlafaxine Other (See Comments)    Insomnia, panic  . Beta Adrenergic Blockers     bradycardia  . Cephalosporins     Allergic Reaction  . Clindamycin/Lincomycin  Dry skin and itching/ Cdiff  . Cymbalta [Duloxetine Hcl] Other (See Comments)    Reaction:Confusion and " I fall down"  . Gabapentin Other (See Comments)    Reaction: headache  . Lamotrigine     Patient unsure at this time  . Mirtazapine Other (See Comments)    unknown  . Morphine Other (See Comments)    Medication has no effect with pain  . Oxycodone-Acetaminophen Hives  . Pregabalin Swelling  . Prochlorperazine Edisylate   . Pseudoephedrine Other (See Comments)    Reaction: hyperventilates and blacks out  . Relafen [Nabumetone] Swelling  . Zoloft [Sertraline] Other (See Comments)    "nervous/jittery"  . Doxycycline Hives   Prior to Admission medications   Medication Sig Start Date End Date Taking? Authorizing Provider  ALPRAZolam Duanne Moron) 0.5 MG tablet Take 0.5 mg by mouth 4 (four) times daily as needed for anxiety.    Yes  Historical Provider, MD  amLODipine (NORVASC) 10 MG tablet Take 1 tablet (10 mg total) by mouth daily. 08/27/15  Yes Sean Hommel, DO  Calcium Carbonate-Vitamin D (CALCIUM 600+D) 600-400 MG-UNIT per tablet Take 1 tablet by mouth 2 (two) times daily.   Yes Historical Provider, MD  clotrimazole-betamethasone (LOTRISONE) cream Apply 1 application topically 2 (two) times daily. 08/14/15  Yes Silverio Decamp, MD  diphenhydrAMINE (BENADRYL) 25 MG tablet Take 25 mg by mouth every 6 (six) hours as needed. For allergies   Yes Historical Provider, MD  diphenoxylate-atropine (LOMOTIL) 2.5-0.025 MG tablet Take 1 tablet by mouth 4 (four) times daily as needed for diarrhea or loose stools. 05/29/15  Yes Sean Hommel, DO  EPINEPHrine (EPI-PEN) 0.3 mg/0.3 mL DEVI Inject 0.3 mg into the muscle as needed (anaphylaxis). Reported on 11/05/2015   Yes Historical Provider, MD  escitalopram (LEXAPRO) 20 MG tablet Take 20 mg by mouth daily.   Yes Historical Provider, MD  fentaNYL (DURAGESIC - DOSED MCG/HR) 25 MCG/HR patch APPLY 1 PATCH TO SKIN EVERY 72 HOURS 08/12/15  Yes Historical Provider, MD  fluticasone (FLONASE) 50 MCG/ACT nasal spray Place 2 sprays into both nostrils daily as needed for allergies or rhinitis.   Yes Historical Provider, MD  hydrochlorothiazide (HYDRODIURIL) 25 MG tablet Take 1 tablet (25 mg total) by mouth daily. 08/27/15  Yes Sean Hommel, DO  ipratropium (ATROVENT) 0.06 % nasal spray Place 2 sprays into both nostrils 3 (three) times daily as needed for rhinitis. 11/25/14  Yes Sean Hommel, DO  montelukast (SINGULAIR) 10 MG tablet Take 1 tablet (10 mg total) by mouth at bedtime. 08/17/15  Yes Sean Hommel, DO  omeprazole (PRILOSEC) 20 MG capsule Take 1 capsule (20 mg total) by mouth daily. 08/27/15  Yes Sean Hommel, DO  ondansetron (ZOFRAN) 4 MG tablet Take 1-2 tablets (4-8 mg total) by mouth every 8 (eight) hours as needed for nausea or vomiting. 05/26/15  Yes Sean Hommel, DO  oxycodone (OXY-IR) 5 MG capsule  Take 10 mg by mouth 4 (four) times daily.    Yes Historical Provider, MD  potassium chloride SA (K-DUR,KLOR-CON) 20 MEQ tablet Take 1 tablet (20 mEq total) by mouth daily. 04/29/15  Yes Sueanne Margarita, MD  simvastatin (ZOCOR) 10 MG tablet Take 1 tablet (10 mg total) by mouth daily. 10/05/15  Yes Sean Hommel, DO  zoledronic acid (RECLAST) 5 MG/100ML SOLN Inject 5 mg into the vein. Once yearly. In June   Yes Historical Provider, MD  AMBULATORY NON FORMULARY MEDICATION [place].  Dx: Rheumatoid Arthritis.  Use daily as needed for  ambulation. 06/17/15   Marcial Pacas, DO  Diclofenac Sodium 2 % SOLN Place 2 sprays onto the skin 2 (two) times daily. 09/04/15   Silverio Decamp, MD     Positive ROS: per HPI  All other systems have been reviewed and were otherwise negative with the exception of those mentioned in the HPI and as above.  Physical Exam: Filed Vitals:   11/05/15 1229 11/05/15 1600  BP: 148/76 162/78  Pulse: 109 78  Temp: 98 F (36.7 C)   Resp: 18 16    General: Alert, no acute distress Oral: Normal oral mucosa and tonsils Nasal: Clear nasal passages Neck: No palpable adenopathy or thyroid nodules Ear: Ear canal is clear with normal appearing TMs Cardiovascular: Regular rate and rhythm, no murmur.  Respiratory: Clear to auscultation Neurologic: Alert and oriented x 3   Assessment/Plan: Foreign Body in Throat Plan for Procedure(s): La Ward, MD 11/05/2015 6:31 PM

## 2015-11-05 NOTE — ED Notes (Signed)
States she was at lunch eating a roast beef sandwhich-states she feels like something is stuck in throat-starting coughing, cant clear throat

## 2015-11-05 NOTE — Anesthesia Preprocedure Evaluation (Addendum)
Anesthesia Evaluation  Patient identified by MRN, date of birth, ID band Patient awake    Reviewed: Allergy & Precautions, NPO status , Patient's Chart, lab work & pertinent test results  Airway Mallampati: II  TM Distance: >3 FB Neck ROM: Full    Dental no notable dental hx.    Pulmonary neg pulmonary ROS,    Pulmonary exam normal breath sounds clear to auscultation       Cardiovascular hypertension, Pt. on medications Normal cardiovascular exam Rhythm:Regular Rate:Normal     Neuro/Psych negative neurological ROS  negative psych ROS   GI/Hepatic negative GI ROS, Neg liver ROS,   Endo/Other  negative endocrine ROS  Renal/GU negative Renal ROS  negative genitourinary   Musculoskeletal  (+) Fibromyalgia -  Abdominal   Peds negative pediatric ROS (+)  Hematology negative hematology ROS (+)   Anesthesia Other Findings   Reproductive/Obstetrics negative OB ROS                            Anesthesia Physical Anesthesia Plan  ASA: II  Anesthesia Plan: General   Post-op Pain Management:    Induction: Intravenous, Rapid sequence and Cricoid pressure planned  Airway Management Planned: Oral ETT  Additional Equipment:   Intra-op Plan:   Post-operative Plan: Extubation in OR  Informed Consent: I have reviewed the patients History and Physical, chart, labs and discussed the procedure including the risks, benefits and alternatives for the proposed anesthesia with the patient or authorized representative who has indicated his/her understanding and acceptance.   Dental advisory given  Plan Discussed with: CRNA  Anesthesia Plan Comments:         Anesthesia Quick Evaluation

## 2015-11-05 NOTE — ED Provider Notes (Signed)
CSN: [id number]     Arrival date & time 11/05/15  1217 History   First MD Initiated Contact with Patient 11/05/15 1234     Chief Complaint  Patient presents with  . Choking      HPI Patient presents to the emergency department with complaints of severe choking episode while eating a roast beef sandwich.  This occurred approximately 45 minutes prior to arrival.  She states she feels some ongoing coughing and anytime she tries to drink something she feels a gurgling sensation in the back of her throat.  She's had no prior history of esophageal impactions.  She reports no shortness of breath at this time.  Symptoms are mild in severity.  She points today anterior neck and feels like there is something abnormal in there.   Past Medical History  Diagnosis Date  . Asthma   . Depression   . GERD (gastroesophageal reflux disease)   . Arthritis     osteoarthritis  . Anxiety   . Osteopenia   . Candida esophagitis ([place])   . Diverticulosis   . Tubular adenoma of colon   . Osteoarthritis   . DJD (degenerative joint disease)      L5 compression fracture  . Gout   . Renal failure, unspecified   . Fibromyalgia   . C. difficile diarrhea   . Chronic sinusitis   . Panic disorder   . Spinal stenosis of lumbar region   . Hyperlipidemia   . Hypertension   . Edema extremities   . Depression    Past Surgical History  Procedure Laterality Date  . Total hip arthroplasty  2004    right  . Total knee arthroplasty  2006    right  . Carpal tunnel release  2007, 2009    Bilateral  . Shoulder surgery  2004, 07/30/10, 01/2011    after her right humeral neck fracture, revision hemiarthroplasty 2004 for persistent pain, revision reverse arthroplasty December 2011 for persistent pain, incision and drainage with poly-exchange 01/26/2011 for possible prosthetic joint infection.  . Total abdominal hysterectomy    . Ganglion cyst  wrist    . Ganglion cyct removal on fingers      right  . Tonsillectomy  and adenoidectomy    . Nasal sinus surgery    . Total shoulder replacement  2002    right  . Knee arthroscopy      right  . Cataract extraction, bilateral    . Choanal adeniodectomy    . Adenoidectomy     Family History  Problem Relation Age of Onset  . Breast cancer    . Colon polyps    . Cancer Father     bladder  . Diabetes Mother   . Hypertension Mother   . Depression Son    Social History  Substance Use Topics  . Smoking status: Never Smoker   . Smokeless tobacco: Never Used  . Alcohol Use: No   OB History    No data available     Review of Systems  All other systems reviewed and are negative.     Allergies  Bee venom; Ceftin; Ciprofloxacin; Ephedrine; Sulfonamide derivatives; Telithromycin; Valsartan; Verapamil; Venlafaxine; Beta adrenergic blockers; Cephalosporins; Clindamycin/lincomycin; Cymbalta; Gabapentin; Lamotrigine; Mirtazapine; Morphine; Oxycodone-acetaminophen; Pregabalin; Prochlorperazine edisylate; Pseudoephedrine; Relafen; Zoloft; and Doxycycline  Home Medications   Prior to Admission medications   Medication Sig Start Date End Date Taking? Authorizing Provider  ALPRAZolam Duanne Moron) 0.5 MG tablet Take 0.5 mg by mouth. For anxiety;one tablet daily  Historical Provider, MD  AMBULATORY NON FORMULARY MEDICATION [place].  Dx: Rheumatoid Arthritis.  Use daily as needed for ambulation. 06/17/15   Sean Hommel, DO  amLODipine (NORVASC) 10 MG tablet Take 1 tablet (10 mg total) by mouth daily. 08/27/15   Marcial Pacas, DO  Calcium Carbonate-Vitamin D (CALCIUM 600+D) 600-400 MG-UNIT per tablet Take 1 tablet by mouth 2 (two) times daily.    Historical Provider, MD  clotrimazole-betamethasone (LOTRISONE) cream Apply 1 application topically 2 (two) times daily. 08/14/15   Silverio Decamp, MD  Diclofenac Sodium 2 % SOLN Place 2 sprays onto the skin 2 (two) times daily. 09/04/15   Silverio Decamp, MD  diphenhydrAMINE (BENADRYL) 25 MG tablet Take 25 mg  by mouth every 6 (six) hours as needed. For allergies    Historical Provider, MD  diphenoxylate-atropine (LOMOTIL) 2.5-0.025 MG tablet Take 1 tablet by mouth 4 (four) times daily as needed for diarrhea or loose stools. 05/29/15   Sean Hommel, DO  EPINEPHrine (EPI-PEN) 0.3 mg/0.3 mL DEVI Inject 0.3 mg into the muscle as needed (anaphylaxis).     Historical Provider, MD  escitalopram (LEXAPRO) 20 MG tablet Take 20 mg by mouth daily.    Historical Provider, MD  fentaNYL (DURAGESIC - DOSED MCG/HR) 25 MCG/HR patch APPLY 1 PATCH TO SKIN EVERY 72 HOURS 08/12/15   Historical Provider, MD  fluticasone (FLONASE) 50 MCG/ACT nasal spray Place 2 sprays into both nostrils daily as needed for allergies or rhinitis.    Historical Provider, MD  hydrochlorothiazide (HYDRODIURIL) 25 MG tablet Take 1 tablet (25 mg total) by mouth daily. 08/27/15   Sean Hommel, DO  ipratropium (ATROVENT) 0.06 % nasal spray Place 2 sprays into both nostrils 3 (three) times daily as needed for rhinitis. 11/25/14   Sean Hommel, DO  montelukast (SINGULAIR) 10 MG tablet Take 1 tablet (10 mg total) by mouth at bedtime. 08/17/15   Sean Hommel, DO  omeprazole (PRILOSEC) 20 MG capsule Take 1 capsule (20 mg total) by mouth daily. 08/27/15   Sean Hommel, DO  ondansetron (ZOFRAN) 4 MG tablet Take 1-2 tablets (4-8 mg total) by mouth every 8 (eight) hours as needed for nausea or vomiting. 05/26/15   Marcial Pacas, DO  oxycodone (OXY-IR) 5 MG capsule Take 10 mg by mouth 4 (four) times daily.     Historical Provider, MD  potassium chloride SA (K-DUR,KLOR-CON) 20 MEQ tablet Take 1 tablet (20 mEq total) by mouth daily. 04/29/15   Sueanne Margarita, MD  simvastatin (ZOCOR) 10 MG tablet Take 1 tablet (10 mg total) by mouth daily. 10/05/15   Marcial Pacas, DO  zoledronic acid (RECLAST) 5 MG/100ML SOLN Inject 5 mg into the vein. Once yearly. In June    Historical Provider, MD   BP 148/76 mmHg  Pulse 109  Temp(Src) 98 F (36.7 C) (Oral)  Resp 18  SpO2 93% Physical Exam   Constitutional: She is oriented to person, place, and time. She appears well-developed and well-nourished. No distress.  HENT:  Head: Normocephalic and atraumatic.  Eyes: EOM are normal.  Neck: Normal range of motion. Neck supple. No tracheal deviation present. No thyromegaly present.  Cardiovascular: Normal rate, regular rhythm and normal heart sounds.   Pulmonary/Chest: Effort normal and breath sounds normal. No stridor.  Abdominal: She exhibits no distension.  Musculoskeletal: Normal range of motion.  Neurological: She is alert and oriented to person, place, and time.  Skin: Skin is warm and dry.  Psychiatric: She has a normal mood and affect. Judgment  normal.  Nursing note and vitals reviewed.   ED Course  Fiberoptic NasoPharyngoscopy Performed by: Jola Schmidt Authorized by: Jola Schmidt Consent: Verbal consent obtained. Risks and benefits: risks, benefits and alternatives were discussed Consent given by: patient Time out: Immediately prior to procedure a "time out" was called to verify the correct patient, procedure, equipment, support staff and site/side marked as required. Patient tolerance: Patient tolerated the procedure well with no immediate complications Comments: Vocal cords appear normal. Normal movement of both vocal cords No foreign bodies noted proximal to the focal cords. No obvious posterior pharyngeal or laryngeal foreign bodies noted    (including critical care time) Labs Review Labs Reviewed - No data to display  Imaging Review Dg Neck Soft Tissue  11/05/2015  CLINICAL DATA:  Was eating a roast beef sandwich for lunch, did not choke but now cannot swallow, feels like something is stuck in her throat, neck pain EXAM: NECK SOFT TISSUES - 1+ VIEW COMPARISON:  None FINDINGS: Prevertebral soft tissues normal thickness. Normal appearance of epiglottis and aryepiglottic folds. Abnormal mixed gas and soft tissue attenuation is seen anterior to the cervical spine  at C5-C6, posterior to the tracheal air column, could represent food debris within deep piriform sinus or in proximal cervical esophagus. Bones demineralized with degenerative disc and facet disease changes of lumbar spine. IMPRESSION: Mixed gas and soft tissue attenuation anteriorly to the cervical spine at C5-C6, question within esophagus or potentially piriform sinus, unable to exclude food debris/impaction. Electronically Signed   By: Lavonia Dana M.D.   On: 11/05/2015 15:49   Dg Chest Portable 1 View  11/05/2015  CLINICAL DATA:  Cough. EXAM: PORTABLE CHEST 1 VIEW COMPARISON:  July 08, 2011. FINDINGS: Stable cardiomediastinal silhouette. No pneumothorax or pleural effusion is noted. No acute pulmonary disease is noted. Status post right shoulder arthroplasty. IMPRESSION: No acute cardiopulmonary abnormality seen. Electronically Signed   By: Marijo Conception, M.D.   On: 11/05/2015 13:02   I have personally reviewed and evaluated these images and lab results as part of my medical decision-making.   EKG Interpretation None      MDM   Final diagnoses:  Esophageal obstruction due to food impaction    I personally performed nasopharyngoscopy on the patient and noticed normal vocal cords with no obvious foreign bodies noted above the vocal cords.  This is likely a cervical esophageal food impaction.  When she attempts to drink water at the bedside she begins coughing and choking and has difficulty breathing for short period time before resolves.  I discussed the case with Dr. Lucia Gaskins of ear nose and throat surgery who will take the patient to the operating room.  At rest the patient is comfortable at this time.  She is not hypoxic.  We will continue to observe the patient closely while in the emergency department.  I briefly discussed the case with gastroenterology who did not feel comfortable taking the patient to the endoscopy suite.  ENT: Dr Mayo Ao, MD 11/05/15 [phone]

## 2015-11-05 NOTE — Progress Notes (Signed)
Dr Marcell Barlow at bedside.  States ok for discharge at this time

## 2015-11-05 NOTE — Discharge Instructions (Signed)
Start with liquids for the first 24 hrs Then advance to soft foods as tolerated Contact Dr Pollie Friar office if you have any fever   [phone] Take Prilosec once per day as an antacid for the next 2 weeks Contact Dr Lucia Gaskins if you have any questions or problems       [phone]

## 2015-11-05 NOTE — Transfer of Care (Signed)
Immediate Anesthesia Transfer of Care Note  Patient: [name]  Procedure(s) Performed: Procedure(s): DIAGNOSTIC LARYNGOSCOPY AND ESOPHAGOSCOPY (N/A)  Patient Location: PACU  Anesthesia Type:General  Level of Consciousness:  sedated, patient cooperative and responds to stimulation  Airway & Oxygen Therapy:Patient Spontanous Breathing and Patient connected to face mask oxgen  Post-op Assessment:  Report given to PACU RN and Post -op Vital signs reviewed and stable  Post vital signs:  Reviewed and stable  Last Vitals:  Filed Vitals:   11/05/15 1229 11/05/15 1600  BP: 148/76 162/78  Pulse: 109 78  Temp: 36.7 C   Resp: 18 16    Complications: No apparent anesthesia complications

## 2015-11-05 NOTE — Anesthesia Postprocedure Evaluation (Signed)
Anesthesia Post Note  Patient: [name]  Procedure(s) Performed: Procedure(s) (LRB): DIAGNOSTIC LARYNGOSCOPY AND ESOPHAGOSCOPY (N/A)  Patient location during evaluation: PACU Anesthesia Type: General Level of consciousness: awake and alert Pain management: pain level controlled Vital Signs Assessment: post-procedure vital signs reviewed and stable Respiratory status: spontaneous breathing, nonlabored ventilation, respiratory function stable and patient connected to nasal cannula oxygen Cardiovascular status: blood pressure returned to baseline and stable Postop Assessment: no signs of nausea or vomiting Anesthetic complications: no    Last Vitals:  Filed Vitals:   11/05/15 2115 11/05/15 2130  BP: 110/61 155/75  Pulse: 93 100  Temp:    Resp: 19     Last Pain: There were no vitals filed for this visit.               Montez Hageman

## 2015-11-06 ENCOUNTER — Encounter (HOSPITAL_COMMUNITY): Payer: Self-pay | Admitting: Otolaryngology

## 2015-11-06 NOTE — Op Note (Signed)
NAMELULU, HIRSCHMANN NO.:  [id number]  MEDICAL RECORD NO.:  [id number]  LOCATION:  [facility]                         FACILITY:  [facility]  PHYSICIAN:  Leonides Sake. Lucia Gaskins, M.D.DATE OF BIRTH:  04/11/1933  DATE OF PROCEDURE:  11/05/2015 DATE OF DISCHARGE:  11/05/2015                              OPERATIVE REPORT   PREOPERATIVE DIAGNOSIS:  Esophageal obstruction with food impaction.  POSTOPERATIVE DIAGNOSIS:  Esophageal obstruction with food impaction.  OPERATION PERFORMED:  Direct laryngoscopy with removal of food impaction.  Cervical esophagoscopy with removal of food impaction.  SURGEON:  Leonides Sake. Lucia Gaskins, M.D.  ANESTHESIA:  General.  COMPLICATIONS:  None.  BRIEF CLINICAL NOTE:  [name] is an 82 year old female who was eating a roast beef sandwich at lunch today and had inability to swallow.  She presented to [facility] Emergency Room with inability to swallow any liquids that made her cough.  Findings were consistent with food impaction as fiberoptic laryngoscopy at bedside showed clear upper airway with no obstruction of the vocal cords and no airway problems. She is unable to swallow any liquid as this makes her cough.  She was taken to the operating room at this time for direct laryngoscopy and esophagoscopy, removal of food impaction.  DESCRIPTION OF PROCEDURE:  After adequate endotracheal anesthesia, direct laryngoscopy was performed initially.  The supraglottis, vocal cords were all clear.  Piriform sinuses were clear; however, at the opening of the upper esophageal sphincter, meat was encountered.  This was removed and the laryngoscope was passed to the end of it and several lumps of meat were removed.  There was still impacted meat and the cervical esophagoscope was utilized and further meat was released using the cervical esophagoscope and graspers.  Finally, about 3-4 cm distal to the upper esophageal sphincter, the remaining meat was  removed and I was able to pass a nasogastric tube down into the stomach without any difficulty.  After removing the meat impaction, the cervical esophagoscope was then passed to its base and there was some slight irritation of the mucosa superiorly just below the upper esophageal sphincter, but the remaining mucosa was intact, no evidence of significant mucosal injury or perforation.  The patient was subsequently awoken from anesthesia and transferred to the recovery room, postop doing well.  DISPOSITION:  She is discharged home later this evening.  Instruction on liquid diet for the next 24 hours and then slowly advance diet to regular diet over the next 2-3 days.  Recommended taking Prilosec once a day for the next 2 weeks.  We will have her contact our office if she has any difficulty or fever following the procedure.          ______________________________ Leonides Sake. Lucia Gaskins, M.D.     CEN/MEDQ  D:  11/05/2015  T:  11/06/2015  Job:  [id number]

## 2015-11-12 DIAGNOSIS — T18120A Food in esophagus causing compression of trachea, initial encounter: Secondary | ICD-10-CM | POA: Diagnosis not present

## 2015-11-16 ENCOUNTER — Encounter: Payer: Self-pay | Admitting: Cardiology

## 2015-11-16 ENCOUNTER — Ambulatory Visit (INDEPENDENT_AMBULATORY_CARE_PROVIDER_SITE_OTHER): Payer: Medicare Other | Admitting: Cardiology

## 2015-11-16 VITALS — BP 132/68 | HR 79 | Ht 61.0 in | Wt 163.0 lb

## 2015-11-16 DIAGNOSIS — F330 Major depressive disorder, recurrent, mild: Secondary | ICD-10-CM | POA: Diagnosis not present

## 2015-11-16 DIAGNOSIS — R001 Bradycardia, unspecified: Secondary | ICD-10-CM | POA: Diagnosis not present

## 2015-11-16 DIAGNOSIS — E785 Hyperlipidemia, unspecified: Secondary | ICD-10-CM

## 2015-11-16 DIAGNOSIS — F410 Panic disorder [episodic paroxysmal anxiety] without agoraphobia: Secondary | ICD-10-CM | POA: Diagnosis not present

## 2015-11-16 DIAGNOSIS — I10 Essential (primary) hypertension: Secondary | ICD-10-CM

## 2015-11-16 DIAGNOSIS — R609 Edema, unspecified: Secondary | ICD-10-CM | POA: Diagnosis not present

## 2015-11-16 DIAGNOSIS — R600 Localized edema: Secondary | ICD-10-CM

## 2015-11-16 NOTE — Progress Notes (Signed)
Cardiology Office Note    Date:  11/16/2015   ID:  [name], DOB [birth year], MRN [id number]  PCP:  Carrie Pacas, DO  Cardiologist:  Carrie Margarita, MD   Chief Complaint  Patient presents with  . Hypertension  . Edema    History of Present Illness:  [name] is a 82 y.o. female with a history of HTN and asymptomatic bradycardia who presents today for followup. She has a history of bradycardia and her beta blocker had to be stopped but then was restarted by her PCP. Her PCP placed her on Toprol initially and then changed it to Tenoretic.  Her Tenoretic was stopped and bradycardia has improved.   She denies any chest pain, SOB, DOE, LE edema,palpitations, dizziness or syncope.     Past Medical History  Diagnosis Date  . Asthma   . Depression   . GERD (gastroesophageal reflux disease)   . Arthritis     osteoarthritis  . Anxiety   . Osteopenia   . Candida esophagitis ([place])   . Diverticulosis   . Tubular adenoma of colon   . Osteoarthritis   . DJD (degenerative joint disease)      L5 compression fracture  . Gout   . Renal failure, unspecified   . Fibromyalgia   . C. difficile diarrhea   . Chronic sinusitis   . Panic disorder   . Spinal stenosis of lumbar region   . Hyperlipidemia   . Hypertension   . Edema extremities   . Depression     Past Surgical History  Procedure Laterality Date  . Total hip arthroplasty  2004    right  . Total knee arthroplasty  2006    right  . Carpal tunnel release  2007, 2009    Bilateral  . Shoulder surgery  2004, 07/30/10, 01/2011    after her right humeral neck fracture, revision hemiarthroplasty 2004 for persistent pain, revision reverse arthroplasty December 2011 for persistent pain, incision and drainage with poly-exchange 01/26/2011 for possible prosthetic joint infection.  . Total abdominal hysterectomy    . Ganglion cyst  wrist    . Ganglion cyct removal on fingers      right  . Tonsillectomy and  adenoidectomy    . Nasal sinus surgery    . Total shoulder replacement  2002    right  . Knee arthroscopy      right  . Cataract extraction, bilateral    . Choanal adeniodectomy    . Adenoidectomy    . Diagnostic laryngoscopy N/A 11/05/2015    Procedure: DIAGNOSTIC LARYNGOSCOPY AND ESOPHAGOSCOPY;  Surgeon: Rozetta Nunnery, MD;  Location: [facility];  Service: ENT;  Laterality: N/A;    Current Medications: Outpatient Prescriptions Prior to Visit  Medication Sig Dispense Refill  . ALPRAZolam (XANAX) 0.5 MG tablet Take 0.5 mg by mouth 4 (four) times daily as needed for anxiety.     . AMBULATORY NON FORMULARY MEDICATION [place].  Dx: Rheumatoid Arthritis.  Use daily as needed for ambulation. 1 Units 0  . amLODipine (NORVASC) 10 MG tablet Take 1 tablet (10 mg total) by mouth daily. 90 tablet 2  . Calcium Carbonate-Vitamin D (CALCIUM 600+D) 600-400 MG-UNIT per tablet Take 1 tablet by mouth 2 (two) times daily.    . clotrimazole-betamethasone (LOTRISONE) cream Apply 1 application topically 2 (two) times daily. 45 g 0  . diphenhydrAMINE (BENADRYL) 25 MG tablet Take 25 mg by mouth every 6 (six) hours as needed. For allergies    .  diphenoxylate-atropine (LOMOTIL) 2.5-0.025 MG tablet Take 1 tablet by mouth 4 (four) times daily as needed for diarrhea or loose stools. 30 tablet 0  . EPINEPHrine (EPI-PEN) 0.3 mg/0.3 mL DEVI Inject 0.3 mg into the muscle as needed (anaphylaxis). Reported on 11/05/2015    . escitalopram (LEXAPRO) 20 MG tablet Take 20 mg by mouth daily.    . fluticasone (FLONASE) 50 MCG/ACT nasal spray Place 2 sprays into both nostrils daily as needed for allergies or rhinitis.    . hydrochlorothiazide (HYDRODIURIL) 25 MG tablet Take 1 tablet (25 mg total) by mouth daily. 90 tablet 1  . ipratropium (ATROVENT) 0.06 % nasal spray Place 2 sprays into both nostrils 3 (three) times daily as needed for rhinitis. 15 mL 12  . montelukast (SINGULAIR) 10 MG tablet Take 1 tablet (10 mg total) by  mouth at bedtime. 30 tablet 3  . omeprazole (PRILOSEC) 20 MG capsule Take 1 capsule (20 mg total) by mouth daily. 90 capsule 1  . ondansetron (ZOFRAN) 4 MG tablet Take 1-2 tablets (4-8 mg total) by mouth every 8 (eight) hours as needed for nausea or vomiting. 30 tablet 1  . oxycodone (OXY-IR) 5 MG capsule Take 10 mg by mouth 4 (four) times daily.     . potassium chloride SA (K-DUR,KLOR-CON) 20 MEQ tablet Take 1 tablet (20 mEq total) by mouth daily. 30 tablet 6  . simvastatin (ZOCOR) 10 MG tablet Take 1 tablet (10 mg total) by mouth daily. 30 tablet 1  . zoledronic acid (RECLAST) 5 MG/100ML SOLN Inject 5 mg into the vein. Once yearly. In June    . fentaNYL (DURAGESIC - DOSED MCG/HR) 25 MCG/HR patch APPLY 1 PATCH TO SKIN EVERY 72 HOURS  0   No facility-administered medications prior to visit.     Allergies:   Bee venom; Ceftin; Ciprofloxacin; Ephedrine; Sulfonamide derivatives; Telithromycin; Valsartan; Verapamil; Venlafaxine; Beta adrenergic blockers; Cephalosporins; Clindamycin/lincomycin; Cymbalta; Gabapentin; Lamotrigine; Mirtazapine; Morphine; Oxycodone-acetaminophen; Pregabalin; Prochlorperazine edisylate; Pseudoephedrine; Relafen; Zoloft; and Doxycycline   Social History   Social History  . Marital Status: Married    Spouse Name: N/A  . Number of Children: N/A  . Years of Education: N/A   Social History Main Topics  . Smoking status: Never Smoker   . Smokeless tobacco: Never Used  . Alcohol Use: No  . Drug Use: No  . Sexual Activity: No   Other Topics Concern  . None   Social History Narrative     Family History:  The patient's family history includes Cancer in her father; Depression in her son; Diabetes in her mother; Hypertension in her mother.   ROS:   Please see the history of present illness.    ROS All other systems reviewed and are negative.   PHYSICAL EXAM:   VS:  BP 132/68 mmHg  Pulse 79  Ht 5\' 1"  (1.549 m)  Wt 163 lb (73.936 kg)  BMI 30.81 kg/m2   GEN:  Well nourished, well developed, in no acute distress HEENT: normal Neck: no JVD, carotid bruits, or masses Cardiac: RRR; no murmurs, rubs, or gallops,no edema.  Intact distal pulses bilaterally.  Respiratory:  clear to auscultation bilaterally, normal work of breathing GI: soft, nontender, nondistended, + BS MS: no deformity or atrophy Skin: warm and dry, no rash Neuro:  Alert and Oriented x 3, Strength and sensation are intact Psych: euthymic mood, full affect  Wt Readings from Last 3 Encounters:  11/16/15 163 lb (73.936 kg)  09/04/15 158 lb (71.668 kg)  08/27/15 159  lb (72.122 kg)      Studies/Labs Reviewed:   EKG:  EKG is not ordered today.    Recent Labs: 03/09/2015: TSH 2.639 05/26/2015: ALT 58* 11/05/2015: BUN 11; Creatinine, Ser 1.33*; Hemoglobin 14.6; Platelets 194; Potassium 3.9; Sodium 138   Lipid Panel    Component Value Date/Time   CHOL 168 09/04/2014 0917   TRIG 148 09/04/2014 0917   HDL 42 09/04/2014 0917   CHOLHDL 4.0 09/04/2014 0917   VLDL 30 09/04/2014 0917   LDLCALC 96 09/04/2014 0917    Additional studies/ records that were reviewed today include:  none    ASSESSMENT:    1. Essential hypertension   2. Bradycardia   3. Dyslipidemia   4. Edema extremities      PLAN:  In order of problems listed above:  1. HTN - BP is well controlled on amlodipine/HCTZ 2. Bradycardia - resolved off Tenoretic 3. Dyslipidemia - continue statin.  Check FLP and ALT.   4. LE edema controlled on diuretic.  Continue HCTZ.  Check BMET.      Medication Adjustments/Labs and Tests Ordered: Current medicines are reviewed at length with the patient today.  Concerns regarding medicines are outlined above.  Medication changes, Labs and Tests ordered today are listed in the Patient Instructions below. There are no Patient Instructions on file for this visit.   Lurena Nida, MD  11/16/2015 9:24 AM    [facility] [address],  [place], [place]  [address] Phone: [phone]; Fax: [phone]

## 2015-11-16 NOTE — Patient Instructions (Signed)
Medication Instructions:  Your physician recommends that you continue on your current medications as directed. Please refer to the Current Medication list given to you today.   Labwork: Your physician recommends that you return for FASTING lab work.  Testing/Procedures: None  Follow-Up: Your physician wants you to follow-up in: 1 year with Dr. Turner. You will receive a reminder letter in the mail two months in advance. If you don't receive a letter, please call our office to schedule the follow-up appointment.   Any Other Special Instructions Will Be Listed Below (If Applicable).     If you need a refill on your cardiac medications before your next appointment, please call your pharmacy.   

## 2015-11-19 DIAGNOSIS — F331 Major depressive disorder, recurrent, moderate: Secondary | ICD-10-CM | POA: Diagnosis not present

## 2015-11-19 DIAGNOSIS — F341 Dysthymic disorder: Secondary | ICD-10-CM | POA: Diagnosis not present

## 2015-11-19 DIAGNOSIS — F410 Panic disorder [episodic paroxysmal anxiety] without agoraphobia: Secondary | ICD-10-CM | POA: Diagnosis not present

## 2015-11-23 ENCOUNTER — Other Ambulatory Visit: Payer: Self-pay

## 2015-11-24 ENCOUNTER — Other Ambulatory Visit (INDEPENDENT_AMBULATORY_CARE_PROVIDER_SITE_OTHER): Payer: Medicare Other | Admitting: *Deleted

## 2015-11-24 DIAGNOSIS — E785 Hyperlipidemia, unspecified: Secondary | ICD-10-CM | POA: Diagnosis not present

## 2015-11-24 DIAGNOSIS — I10 Essential (primary) hypertension: Secondary | ICD-10-CM | POA: Diagnosis not present

## 2015-11-24 LAB — HEPATIC FUNCTION PANEL
ALT: 30 U/L — ABNORMAL HIGH (ref 6–29)
AST: 52 U/L — AB (ref 10–35)
Albumin: 3.8 g/dL (ref 3.6–5.1)
Alkaline Phosphatase: 85 U/L (ref 33–130)
BILIRUBIN DIRECT: 0.2 mg/dL (ref <=0.2)
BILIRUBIN INDIRECT: 0.6 mg/dL (ref 0.2–1.2)
BILIRUBIN TOTAL: 0.8 mg/dL (ref 0.2–1.2)
Total Protein: 6.9 g/dL (ref 6.1–8.1)

## 2015-11-24 LAB — LIPID PANEL
CHOL/HDL RATIO: 4.5 ratio (ref <=5.0)
CHOLESTEROL: 130 mg/dL (ref 125–200)
HDL: 29 mg/dL — ABNORMAL LOW (ref >=46)
LDL CALC: 63 mg/dL (ref <130)
Triglycerides: 191 mg/dL — ABNORMAL HIGH (ref <150)
VLDL: 38 mg/dL — AB (ref <30)

## 2015-11-24 LAB — BASIC METABOLIC PANEL
BUN: 8 mg/dL (ref 7–25)
CHLORIDE: 98 mmol/L (ref 98–110)
CO2: 30 mmol/L (ref 20–31)
Calcium: 9.6 mg/dL (ref 8.6–10.4)
Creat: 1.05 mg/dL — ABNORMAL HIGH (ref 0.60–0.88)
Glucose, Bld: 94 mg/dL (ref 65–99)
POTASSIUM: 3.6 mmol/L (ref 3.5–5.3)
Sodium: 140 mmol/L (ref 135–146)

## 2015-12-03 DIAGNOSIS — M47816 Spondylosis without myelopathy or radiculopathy, lumbar region: Secondary | ICD-10-CM | POA: Diagnosis not present

## 2015-12-03 DIAGNOSIS — G4700 Insomnia, unspecified: Secondary | ICD-10-CM | POA: Diagnosis not present

## 2015-12-03 DIAGNOSIS — G894 Chronic pain syndrome: Secondary | ICD-10-CM | POA: Diagnosis not present

## 2015-12-03 DIAGNOSIS — M069 Rheumatoid arthritis, unspecified: Secondary | ICD-10-CM | POA: Diagnosis not present

## 2015-12-09 ENCOUNTER — Other Ambulatory Visit: Payer: Self-pay | Admitting: *Deleted

## 2015-12-09 MED ORDER — SIMVASTATIN 10 MG PO TABS: 10.0000 mg | ORAL_TABLET | Freq: Every day | ORAL | Status: DC

## 2015-12-17 DIAGNOSIS — F341 Dysthymic disorder: Secondary | ICD-10-CM | POA: Diagnosis not present

## 2015-12-17 DIAGNOSIS — F330 Major depressive disorder, recurrent, mild: Secondary | ICD-10-CM | POA: Diagnosis not present

## 2015-12-17 DIAGNOSIS — F410 Panic disorder [episodic paroxysmal anxiety] without agoraphobia: Secondary | ICD-10-CM | POA: Diagnosis not present

## 2015-12-29 DIAGNOSIS — M1712 Unilateral primary osteoarthritis, left knee: Secondary | ICD-10-CM | POA: Diagnosis not present

## 2015-12-30 ENCOUNTER — Other Ambulatory Visit: Payer: Self-pay | Admitting: Family Medicine

## 2015-12-30 ENCOUNTER — Ambulatory Visit (INDEPENDENT_AMBULATORY_CARE_PROVIDER_SITE_OTHER): Payer: Medicare Other | Admitting: Family Medicine

## 2015-12-30 ENCOUNTER — Encounter: Payer: Self-pay | Admitting: Family Medicine

## 2015-12-30 VITALS — BP 122/73 | HR 69 | Wt 163.0 lb

## 2015-12-30 DIAGNOSIS — I10 Essential (primary) hypertension: Secondary | ICD-10-CM | POA: Diagnosis not present

## 2015-12-30 DIAGNOSIS — Z23 Encounter for immunization: Secondary | ICD-10-CM | POA: Diagnosis not present

## 2015-12-30 MED ORDER — SIMVASTATIN 10 MG PO TABS
10.0000 mg | ORAL_TABLET | Freq: Every day | ORAL | Status: DC
Start: 2015-12-30 — End: 2016-07-13

## 2015-12-30 MED ORDER — EPINEPHRINE 0.3 MG/0.3ML IJ SOAJ: 0.3000 mg | Freq: Once | INTRAMUSCULAR | Status: DC

## 2015-12-30 MED ORDER — DOXAZOSIN MESYLATE 2 MG PO TABS: 2.0000 mg | ORAL_TABLET | Freq: Every day | ORAL | Status: DC

## 2015-12-30 NOTE — Progress Notes (Signed)
CC: [name] is a 82 y.o. female is here for Medication Management   Subjective: HPI:  She's noticed over the past few weeks her HCTZ has become much more potent she is urinating more frequently. On days that she doesn't take the medication she doesn't have nearly as much urinary free can see. She is waking up 3 or 4 times at night to urinate and spending more money on depends and she is on the medication itself. She denies any other urinary complaints such as dysuria, urinary hesitancy or sensation of incomplete voiding. She denies fevers, chills or flank pain. No blood in the urine. She wants note there something all she can take that doesn't have the side effect. Review Of Systems Outlined In HPI  Past Medical History  Diagnosis Date  . Asthma   . Depression   . GERD (gastroesophageal reflux disease)   . Arthritis     osteoarthritis  . Anxiety   . Osteopenia   . Candida esophagitis ([place])   . Diverticulosis   . Tubular adenoma of colon   . Osteoarthritis   . DJD (degenerative joint disease)      L5 compression fracture  . Gout   . Renal failure, unspecified   . Fibromyalgia   . C. difficile diarrhea   . Chronic sinusitis   . Panic disorder   . Spinal stenosis of lumbar region   . Hyperlipidemia   . Hypertension   . Edema extremities   . Depression     Past Surgical History  Procedure Laterality Date  . Total hip arthroplasty  2004    right  . Total knee arthroplasty  2006    right  . Carpal tunnel release  2007, 2009    Bilateral  . Shoulder surgery  2004, 07/30/10, 01/2011    after her right humeral neck fracture, revision hemiarthroplasty 2004 for persistent pain, revision reverse arthroplasty December 2011 for persistent pain, incision and drainage with poly-exchange 01/26/2011 for possible prosthetic joint infection.  . Total abdominal hysterectomy    . Ganglion cyst  wrist    . Ganglion cyct removal on fingers      right  . Tonsillectomy and  adenoidectomy    . Nasal sinus surgery    . Total shoulder replacement  2002    right  . Knee arthroscopy      right  . Cataract extraction, bilateral    . Choanal adeniodectomy    . Adenoidectomy    . Diagnostic laryngoscopy N/A 11/05/2015    Procedure: DIAGNOSTIC LARYNGOSCOPY AND ESOPHAGOSCOPY;  Surgeon: Rozetta Nunnery, MD;  Location: [facility];  Service: ENT;  Laterality: N/A;   Family History  Problem Relation Age of Onset  . Breast cancer    . Colon polyps    . Cancer Father     bladder  . Diabetes Mother   . Hypertension Mother   . Depression Son     Social History   Social History  . Marital Status: Married    Spouse Name: N/A  . Number of Children: N/A  . Years of Education: N/A   Occupational History  . Not on file.   Social History Main Topics  . Smoking status: Never Smoker   . Smokeless tobacco: Never Used  . Alcohol Use: No  . Drug Use: No  . Sexual Activity: No   Other Topics Concern  . Not on file   Social History Narrative     Objective: BP 122/73 mmHg  Pulse 69  Wt 163 lb (73.936 kg)  Vital signs reviewed. General: Alert and Oriented, No Acute Distress HEENT: Pupils equal, round, reactive to light. Conjunctivae clear.  External ears unremarkable.  Moist mucous membranes. Lungs: Clear and comfortable work of breathing, speaking in full sentences without accessory muscle use. Cardiac: Regular rate and rhythm.  Neuro: CN II-XII grossly intact, gait normal. Extremities: No peripheral edema.  Strong peripheral pulses.  Mental Status: No depression, anxiety, nor agitation. Logical though process. Skin: Warm and dry.  Assessment & Plan: [name] was seen today for medication management.  Diagnoses and all orders for this visit:  Essential hypertension  Other orders -     doxazosin (CARDURA) 2 MG tablet; Take 1 tablet (2 mg total) by mouth daily. -     EPINEPHrine (ADRENACLICK) 0.3 [id number] mL IJ SOAJ injection; Inject 0.3 mLs (0.3 mg  total) into the muscle once. -     simvastatin (ZOCOR) 10 MG tablet; Take 1 tablet (10 mg total) by mouth daily.    Essential hypertension is controlled however hydrochlorothiazide is causing intolerable urinary frequency. Switching to Cardura continue current dose of amlodipine. She also needs a refill on Zocor  Return if symptoms worsen or fail to improve.

## 2016-01-04 ENCOUNTER — Telehealth: Payer: Self-pay | Admitting: *Deleted

## 2016-01-04 NOTE — Telephone Encounter (Signed)
PA initiated for epipen

## 2016-01-05 ENCOUNTER — Telehealth: Payer: Self-pay

## 2016-01-05 DIAGNOSIS — I10 Essential (primary) hypertension: Secondary | ICD-10-CM

## 2016-01-05 NOTE — Telephone Encounter (Signed)
Insurance faxed back no PA is needed for this med

## 2016-01-05 NOTE — Telephone Encounter (Signed)
Can you please call the infusion center at [facility] and see if they have a sheet I can fill out for this order or if it needs to be placed electronically in epic? If in epic, can they give me the exact order name and once the order is placed how to arrange/schedule this visit.

## 2016-01-05 NOTE — Telephone Encounter (Signed)
[name] states it is time for her Reclast. She is ok with going to [facility].

## 2016-01-06 NOTE — Telephone Encounter (Signed)
Called [facility] and left a message requesting a return call

## 2016-01-07 DIAGNOSIS — F330 Major depressive disorder, recurrent, mild: Secondary | ICD-10-CM | POA: Diagnosis not present

## 2016-01-07 DIAGNOSIS — F410 Panic disorder [episodic paroxysmal anxiety] without agoraphobia: Secondary | ICD-10-CM | POA: Diagnosis not present

## 2016-01-07 DIAGNOSIS — F341 Dysthymic disorder: Secondary | ICD-10-CM | POA: Diagnosis not present

## 2016-01-13 ENCOUNTER — Encounter: Payer: Self-pay | Admitting: Sports Medicine

## 2016-01-13 ENCOUNTER — Ambulatory Visit (INDEPENDENT_AMBULATORY_CARE_PROVIDER_SITE_OTHER): Payer: Medicare Other | Admitting: Sports Medicine

## 2016-01-13 DIAGNOSIS — J302 Other seasonal allergic rhinitis: Secondary | ICD-10-CM

## 2016-01-13 MED ORDER — LORATADINE 10 MG PO TABS: 10.0000 mg | ORAL_TABLET | Freq: Every day | ORAL | Status: DC

## 2016-01-13 MED ORDER — BECLOMETHASONE DIPROPIONATE 80 MCG/ACT NA AERS: 2.0000 | INHALATION_SPRAY | Freq: Every day | NASAL | Status: DC

## 2016-01-13 NOTE — Assessment & Plan Note (Addendum)
Combination of perennial allergic rhinitis and allergic conjunctivitis. Using Flonase, but is not on a systemic antihistamine. Continue Singulair. Adding loratadine daily, we will also switch her from Flonase to Qnasl. Has already failed Flonase, Nasacort, Azelastine, nasonex. She has seen her ENT doctor several times. Considering her dry nasal mucosae would also like her to use a humidifier, as well as add Vaseline to her nares daily Return to see PCP in one month.

## 2016-01-13 NOTE — Progress Notes (Signed)
  Subjective:    CC: Nose stopped up  HPI: This is an 82 year old female with known allergic rhinitis, she has on and off nasal stuffiness, this current episode is been present for several weeks, she has tried multiple nasal steroids, nasal decongestants, is not on any systemic antihistamines. She has seen an ear nose and throat doctor, symptoms are moderate, persistent. No shortness of breath, chest pain. No cough, no wheeze. Does get a bit of dried blood from her nares occasionally.  Past medical history, Surgical history, Family history not pertinant except as noted below, Social history, Allergies, and medications have been entered into the medical record, reviewed, and no changes needed.   Review of Systems: No fevers, chills, night sweats, weight loss, chest pain, or shortness of breath.   Objective:    General: Well Developed, well nourished, and in no acute distress.  Neuro: Alert and oriented x3, extra-ocular muscles intact, sensation grossly intact.  HEENT: Normocephalic, atraumatic, pupils equal round reactive to light, neck supple, no masses, no lymphadenopathy, thyroid nonpalpable. Nasal mucosa is dry, no visible blood. Skin: Warm and dry, no rashes. Cardiac: Regular rate and rhythm, no murmurs rubs or gallops, no lower extremity edema.  Respiratory: Clear to auscultation bilaterally. Not using accessory muscles, speaking in full sentences.  Impression and Recommendations:    I spent 25 minutes with this patient, greater than 50% was face-to-face time counseling regarding the above diagnoses

## 2016-01-15 DIAGNOSIS — G894 Chronic pain syndrome: Secondary | ICD-10-CM | POA: Diagnosis not present

## 2016-01-15 DIAGNOSIS — G4700 Insomnia, unspecified: Secondary | ICD-10-CM | POA: Diagnosis not present

## 2016-01-15 DIAGNOSIS — M47816 Spondylosis without myelopathy or radiculopathy, lumbar region: Secondary | ICD-10-CM | POA: Diagnosis not present

## 2016-01-15 DIAGNOSIS — M069 Rheumatoid arthritis, unspecified: Secondary | ICD-10-CM | POA: Diagnosis not present

## 2016-01-20 ENCOUNTER — Telehealth: Payer: Self-pay | Admitting: *Deleted

## 2016-01-20 NOTE — Telephone Encounter (Signed)
PA initiated through covermymeds. Key: [id number]

## 2016-01-21 NOTE — Telephone Encounter (Signed)
qnasl approved  Approvedon June 21  Effective from 01/20/2016 through 01/19/2017. Message left on pharm vm and pt's vm

## 2016-01-22 NOTE — Telephone Encounter (Signed)
[name] is calling back to see if we have ordered the Reclast appointment from [facility]. I called [facility],  [phone], and they will fax over the form. She needs to have a recent CMP to have her calcium and creatine checked. It has to be within the last 30 days. I called and advised patient to come in for lab work. I ordered the labs. Please fill out paperwork and give to Dr Ileene Rubens to sign. Thanks

## 2016-01-25 DIAGNOSIS — I10 Essential (primary) hypertension: Secondary | ICD-10-CM | POA: Diagnosis not present

## 2016-01-26 DIAGNOSIS — F410 Panic disorder [episodic paroxysmal anxiety] without agoraphobia: Secondary | ICD-10-CM | POA: Diagnosis not present

## 2016-01-26 DIAGNOSIS — F331 Major depressive disorder, recurrent, moderate: Secondary | ICD-10-CM | POA: Diagnosis not present

## 2016-01-26 DIAGNOSIS — F341 Dysthymic disorder: Secondary | ICD-10-CM | POA: Diagnosis not present

## 2016-01-26 LAB — COMPLETE METABOLIC PANEL WITH GFR
ALBUMIN: 3.8 g/dL (ref 3.6–5.1)
ALT: 35 U/L — AB (ref 6–29)
AST: 54 U/L — ABNORMAL HIGH (ref 10–35)
Alkaline Phosphatase: 84 U/L (ref 33–130)
BUN: 12 mg/dL (ref 7–25)
CHLORIDE: 99 mmol/L (ref 98–110)
CO2: 29 mmol/L (ref 20–31)
CREATININE: 0.94 mg/dL — AB (ref 0.60–0.88)
Calcium: 8.9 mg/dL (ref 8.6–10.4)
GFR, EST AFRICAN AMERICAN: 66 mL/min (ref >=60)
GFR, EST NON AFRICAN AMERICAN: 57 mL/min — AB (ref >=60)
GLUCOSE: 144 mg/dL — AB (ref 65–99)
Potassium: 3.3 mmol/L — ABNORMAL LOW (ref 3.5–5.3)
Sodium: 141 mmol/L (ref 135–146)
TOTAL PROTEIN: 6.4 g/dL (ref 6.1–8.1)
Total Bilirubin: 0.7 mg/dL (ref 0.2–1.2)

## 2016-01-26 NOTE — Telephone Encounter (Signed)
Form in your box

## 2016-01-26 NOTE — Telephone Encounter (Signed)
[place] Completed form in your in box. After faxing can you please see that the form is scanned into patient's chart.

## 2016-01-26 NOTE — Telephone Encounter (Signed)
Levada Dy or Ellamae Sia, Will you please let [facility] know that [facility] CMP has returned.  I've not yet received any form to fill out for her Reclast infusion but would be happy to fill it out once they fax it to our office.

## 2016-01-26 NOTE — Telephone Encounter (Signed)
Order faxed form sent to scan.

## 2016-02-01 ENCOUNTER — Telehealth: Payer: Self-pay

## 2016-02-01 MED ORDER — HYDROCHLOROTHIAZIDE 25 MG PO TABS: ORAL_TABLET | ORAL | Status: DC

## 2016-02-01 NOTE — Telephone Encounter (Signed)
Pt called stating that since starting doxazosin (CARDURA) 2 MG tablet she is noticing increased irritability and blurred vision. She stopped taking this medication and took leftover hctz she had and notices an improvement in mood overall wellness. Pt stopped htcz because it caused her to have frequent urination. She does not want to continue the doxazosin (CARDURA) 2 MG tablet . Please advise.

## 2016-02-01 NOTE — Telephone Encounter (Signed)
Understood, this change has been made to her med list and the pharmacy has been notified.

## 2016-02-01 NOTE — Telephone Encounter (Signed)
In a ideal situation she should take the hydrochlorothiazide on a daily basis but if the frequent urination interferes with her quality of life my feelings will not be hurt if she decides to not take this medication.

## 2016-02-01 NOTE — Telephone Encounter (Signed)
Should the pt restart HCTZ despite increased urination? Please advise.

## 2016-02-03 ENCOUNTER — Encounter: Payer: Self-pay | Admitting: Family Medicine

## 2016-02-03 ENCOUNTER — Ambulatory Visit (INDEPENDENT_AMBULATORY_CARE_PROVIDER_SITE_OTHER): Payer: Medicare Other | Admitting: Family Medicine

## 2016-02-03 VITALS — BP 135/71 | HR 96 | Wt 160.0 lb

## 2016-02-03 DIAGNOSIS — I10 Essential (primary) hypertension: Secondary | ICD-10-CM | POA: Diagnosis not present

## 2016-02-03 DIAGNOSIS — J302 Other seasonal allergic rhinitis: Secondary | ICD-10-CM | POA: Diagnosis not present

## 2016-02-03 DIAGNOSIS — R739 Hyperglycemia, unspecified: Secondary | ICD-10-CM | POA: Diagnosis not present

## 2016-02-03 MED ORDER — AZELASTINE-FLUTICASONE 137-50 MCG/ACT NA SUSP: NASAL | Status: DC

## 2016-02-03 MED ORDER — DILTIAZEM HCL ER COATED BEADS 120 MG PO CP24
120.0000 mg | ORAL_CAPSULE | Freq: Every day | ORAL | Status: DC
Start: 2016-02-03 — End: 2016-02-03

## 2016-02-03 MED ORDER — HYDRALAZINE HCL 10 MG PO TABS: 10.0000 mg | ORAL_TABLET | Freq: Three times a day (TID) | ORAL | Status: DC

## 2016-02-03 NOTE — Progress Notes (Signed)
CC: [name] is a 82 y.o. female is here for Sinusitis and Medication Management   Subjective: HPI:  Follow-up essential hypertension: She was intolerant of doxazosin, she believes it was causing her irritability and also blurred vision. Her suspected side effects resolved one day after she stopped taking this medication. She restarted taking hydrochlorothiazide which is helping her keep her blood pressure at goal however because intolerable urinary frequency and bladder leakage. She is upset that her bladder habits are  controlling her life.  Follow-up seasonal allergies: She is requesting a different medication other than qvar because she doesn't want to spend $90 a month which would be her co-pay. Fluticasone has helped in the past however there was still room for improvement in her opinion. Her specific symptoms that she is suffering from include thick nasal drainage, sinus pressure and postnasal drip. Symptoms are present to severe degree currently but without fever or headache.  She recently had a metabolic panel obtained to see if it was safe for her to get her Reclast and it showed a mild hyperglycemia but she admits that her diet has been heavy in carbohydrates lately   Review Of Systems Outlined In HPI  Past Medical History  Diagnosis Date  . Asthma   . Depression   . GERD (gastroesophageal reflux disease)   . Arthritis     osteoarthritis  . Anxiety   . Osteopenia   . Candida esophagitis ([place])   . Diverticulosis   . Tubular adenoma of colon   . Osteoarthritis   . DJD (degenerative joint disease)      L5 compression fracture  . Gout   . Renal failure, unspecified   . Fibromyalgia   . C. difficile diarrhea   . Chronic sinusitis   . Panic disorder   . Spinal stenosis of lumbar region   . Hyperlipidemia   . Hypertension   . Edema extremities   . Depression     Past Surgical History  Procedure Laterality Date  . Total hip arthroplasty  2004    right  . Total  knee arthroplasty  2006    right  . Carpal tunnel release  2007, 2009    Bilateral  . Shoulder surgery  2004, 07/30/10, 01/2011    after her right humeral neck fracture, revision hemiarthroplasty 2004 for persistent pain, revision reverse arthroplasty December 2011 for persistent pain, incision and drainage with poly-exchange 01/26/2011 for possible prosthetic joint infection.  . Total abdominal hysterectomy    . Ganglion cyst  wrist    . Ganglion cyct removal on fingers      right  . Tonsillectomy and adenoidectomy    . Nasal sinus surgery    . Total shoulder replacement  2002    right  . Knee arthroscopy      right  . Cataract extraction, bilateral    . Choanal adeniodectomy    . Adenoidectomy    . Diagnostic laryngoscopy N/A 11/05/2015    Procedure: DIAGNOSTIC LARYNGOSCOPY AND ESOPHAGOSCOPY;  Surgeon: Rozetta Nunnery, MD;  Location: [facility];  Service: ENT;  Laterality: N/A;   Family History  Problem Relation Age of Onset  . Breast cancer    . Colon polyps    . Cancer Father     bladder  . Diabetes Mother   . Hypertension Mother   . Depression Son     Social History   Social History  . Marital Status: Married    Spouse Name: N/A  . Number  of Children: N/A  . Years of Education: N/A   Occupational History  . Not on file.   Social History Main Topics  . Smoking status: Never Smoker   . Smokeless tobacco: Never Used  . Alcohol Use: No  . Drug Use: No  . Sexual Activity: No   Other Topics Concern  . Not on file   Social History Narrative     Objective: BP 135/71 mmHg  Pulse 96  Wt 160 lb (72.576 kg)  General: Alert and Oriented, No Acute Distress HEENT: Pupils equal, round, reactive to light. Conjunctivae clear.  Moist mucous members, pharynx unremarkable, nares unremarkable Lungs: Clear to auscultation bilaterally, no wheezing/ronchi/rales.  Comfortable work of breathing. Good air movement. Cardiac: Regular rate and rhythm. Normal S1/S2.  No murmurs,  rubs, nor gallops.   Extremities: No peripheral edema.  Strong peripheral pulses.  Mental Status: No depression, anxiety, nor agitation. Skin: Warm and dry.  Assessment & Plan: [name] was seen today for sinusitis and medication management.  Diagnoses and all orders for this visit:  Essential hypertension  Seasonal allergies  Hyperglycemia -     BASIC METABOLIC PANEL WITH GFR  Other orders -     Azelastine-Fluticasone 137-50 MCG/ACT SUSP; One spray each nostril twice a day. -     Discontinue: diltiazem (CARDIZEM CD) 120 MG 24 hr capsule; Take 1 capsule (120 mg total) by mouth daily. -     hydrALAZINE (APRESOLINE) 10 MG tablet; Take 1 tablet (10 mg total) by mouth 3 (three) times daily.    essential hypertension: Switching from HCTZ to hydralazine, continue amlodipine.   seasonal allergies: Discussed that she'll likely benefit from [facility] and should be affordable Hypoxemia: Repeat metabolic panel next week to see if she is becoming prediabetic. Time was spent in her questions about recent phone calls that she believes have not been answered from our office and results that she tells me whenever community gated to her over the last couple months.  40 minutes spent face-to-face during visit today of which at least 50% was counseling or coordinating care regarding: 1. Essential hypertension   2. Seasonal allergies   3. Hyperglycemia      Return for Blood sugar check in one week at the lab.[name]

## 2016-02-09 DIAGNOSIS — R739 Hyperglycemia, unspecified: Secondary | ICD-10-CM | POA: Diagnosis not present

## 2016-02-09 LAB — BASIC METABOLIC PANEL WITH GFR
BUN: 10 mg/dL (ref 7–25)
CHLORIDE: 103 mmol/L (ref 98–110)
CO2: 27 mmol/L (ref 20–31)
Calcium: 9 mg/dL (ref 8.6–10.4)
Creat: 0.97 mg/dL — ABNORMAL HIGH (ref 0.60–0.88)
GFR, EST AFRICAN AMERICAN: 64 mL/min (ref >=60)
GFR, Est Non African American: 55 mL/min — ABNORMAL LOW (ref >=60)
Glucose, Bld: 145 mg/dL — ABNORMAL HIGH (ref 65–99)
POTASSIUM: 3.6 mmol/L (ref 3.5–5.3)
Sodium: 141 mmol/L (ref 135–146)

## 2016-02-10 ENCOUNTER — Telehealth: Payer: Self-pay | Admitting: Family Medicine

## 2016-02-10 DIAGNOSIS — R739 Hyperglycemia, unspecified: Secondary | ICD-10-CM | POA: Diagnosis not present

## 2016-02-10 NOTE — Telephone Encounter (Signed)
Pt.notified

## 2016-02-10 NOTE — Telephone Encounter (Signed)
Will you please let patient know that her potassium has corrected itself however her blood sugar remains elevated. I'd recommend checking an A1c, lab slip in your in box if the lab can't add this on.

## 2016-02-11 LAB — HEMOGLOBIN A1C
Hgb A1c MFr Bld: 6.1 % — ABNORMAL HIGH (ref <5.7)
Mean Plasma Glucose: 128 mg/dL

## 2016-02-12 ENCOUNTER — Ambulatory Visit (HOSPITAL_COMMUNITY)
Admission: RE | Admit: 2016-02-12 | Discharge: 2016-02-12 | Disposition: A | Discharge status: Home / Self Care | Payer: Medicare Other | Source: Ambulatory Visit | Attending: Family Medicine | Admitting: Family Medicine

## 2016-02-12 ENCOUNTER — Telehealth: Payer: Self-pay

## 2016-02-12 DIAGNOSIS — M810 Age-related osteoporosis without current pathological fracture: Secondary | ICD-10-CM | POA: Insufficient documentation

## 2016-02-12 MED ORDER — SODIUM CHLORIDE 0.9 % IV SOLN
Freq: Once | INTRAVENOUS | Status: AC
  Administered 2016-02-12: 13:00:00 via INTRAVENOUS

## 2016-02-12 MED ORDER — ZOLEDRONIC ACID 5 MG/100ML IV SOLN
5.0000 mg | Freq: Once | INTRAVENOUS | Status: AC
Start: 2016-02-12 — End: 2016-02-12
  Administered 2016-02-12: 5 mg via INTRAVENOUS
  Filled 2016-02-12: qty 100

## 2016-02-12 NOTE — Telephone Encounter (Signed)
I recommend switching back. Return to clinic soon for repeat evaluation

## 2016-02-12 NOTE — Telephone Encounter (Signed)
Dr Ileene Rubens switched [name]'s blood pressure medication from HCTZ to Hydralazine. She was having frequent urination with the HCTZ. She now complains the Hydralazine is causing her to have headaches, diarrhea, stomach cramps and nausea. She would like to switch back to the HCTZ. She has 6 months of refills for the HCTZ at the pharmacy. Please advise.

## 2016-02-12 NOTE — Discharge Instructions (Signed)

## 2016-02-12 NOTE — Telephone Encounter (Signed)
Left message advising of recommendations.  

## 2016-02-17 NOTE — Telephone Encounter (Signed)
Patient states she switched back to the hydrochlorothiazide and the potassium.

## 2016-02-19 ENCOUNTER — Other Ambulatory Visit: Payer: Self-pay | Admitting: Cardiology

## 2016-02-23 DIAGNOSIS — J3489 Other specified disorders of nose and nasal sinuses: Secondary | ICD-10-CM | POA: Diagnosis not present

## 2016-02-24 ENCOUNTER — Ambulatory Visit (INDEPENDENT_AMBULATORY_CARE_PROVIDER_SITE_OTHER): Payer: Medicare Other | Admitting: Osteopathic Medicine

## 2016-02-24 ENCOUNTER — Encounter: Payer: Self-pay | Admitting: Osteopathic Medicine

## 2016-02-24 ENCOUNTER — Ambulatory Visit: Payer: Self-pay | Admitting: Family Medicine

## 2016-02-24 VITALS — BP 117/69 | HR 88 | Ht 60.0 in | Wt 158.0 lb

## 2016-02-24 DIAGNOSIS — M797 Fibromyalgia: Secondary | ICD-10-CM | POA: Diagnosis not present

## 2016-02-24 DIAGNOSIS — N183 Chronic kidney disease, stage 3 unspecified: Secondary | ICD-10-CM

## 2016-02-24 DIAGNOSIS — I10 Essential (primary) hypertension: Secondary | ICD-10-CM

## 2016-02-24 DIAGNOSIS — R7303 Prediabetes: Secondary | ICD-10-CM

## 2016-02-24 DIAGNOSIS — M153 Secondary multiple arthritis: Secondary | ICD-10-CM | POA: Diagnosis not present

## 2016-02-24 NOTE — Patient Instructions (Signed)
Insomnia Insomnia is a sleep disorder that makes it difficult to fall asleep or to stay asleep. Insomnia can cause tiredness (fatigue), low energy, difficulty concentrating, mood swings, and poor performance at work or school.  There are three different ways to classify insomnia:  Difficulty falling asleep.  Difficulty staying asleep.  Waking up too early in the morning. Any type of insomnia can be long-term (chronic) or short-term (acute). Both are common. Short-term insomnia usually lasts for three months or less. Chronic insomnia occurs at least three times a week for longer than three months. CAUSES  Insomnia may be caused by another condition, situation, or substance, such as:  Anxiety.  Certain medicines.  Gastroesophageal reflux disease (GERD) or other gastrointestinal conditions.  Asthma or other breathing conditions.  Restless legs syndrome, sleep apnea, or other sleep disorders.  Chronic pain.  Menopause. This may include hot flashes.  Stroke.  Abuse of alcohol, tobacco, or illegal drugs.  Depression.  Caffeine.   Neurological disorders, such as Alzheimer disease.  An overactive thyroid (hyperthyroidism). The cause of insomnia may not be known. RISK FACTORS Risk factors for insomnia include:  Gender. Women are more commonly affected than men.  Age. Insomnia is more common as you get older.  Stress. This may involve your professional or personal life.  Income. Insomnia is more common in people with lower income.  Lack of exercise.   Irregular work schedule or night shifts.  Traveling between different time zones. SIGNS AND SYMPTOMS If you have insomnia, trouble falling asleep or trouble staying asleep is the main symptom. This may lead to other symptoms, such as:  Feeling fatigued.  Feeling nervous about going to sleep.  Not feeling rested in the morning.  Having trouble concentrating.  Feeling irritable, anxious, or depressed. TREATMENT   Treatment for insomnia depends on the cause. If your insomnia is caused by an underlying condition, treatment will focus on addressing the condition. Treatment may also include:   Medicines to help you sleep.  Counseling or therapy.  Lifestyle adjustments. HOME CARE INSTRUCTIONS   Take medicines only as directed by your health care provider.  Keep regular sleeping and waking hours. Avoid naps.  Keep a sleep diary to help you and your health care provider figure out what could be causing your insomnia. Include:   When you sleep.  When you wake up during the night.  How well you sleep.   How rested you feel the next day.  Any side effects of medicines you are taking.  What you eat and drink.   Make your bedroom a comfortable place where it is easy to fall asleep:  Put up shades or special blackout curtains to block light from outside.  Use a white noise machine to block noise.  Keep the temperature cool.   Exercise regularly as directed by your health care provider. Avoid exercising right before bedtime.  Use relaxation techniques to manage stress. Ask your health care provider to suggest some techniques that may work well for you. These may include:  Breathing exercises.  Routines to release muscle tension.  Visualizing peaceful scenes.  Cut back on alcohol, caffeinated beverages, and cigarettes, especially close to bedtime. These can disrupt your sleep.  Do not overeat or eat spicy foods right before bedtime. This can lead to digestive discomfort that can make it hard for you to sleep.  Limit screen use before bedtime. This includes:  Watching TV.  Using your smartphone, tablet, and computer.  Stick to a routine. This   can help you fall asleep faster. Try to do a quiet activity, brush your teeth, and go to bed at the same time each night.  Get out of bed if you are still awake after 15 minutes of trying to sleep. Keep the lights down, but try reading or  doing a quiet activity. When you feel sleepy, go back to bed.  Make sure that you drive carefully. Avoid driving if you feel very sleepy.  Keep all follow-up appointments as directed by your health care provider. This is important. SEEK MEDICAL CARE IF:   You are tired throughout the day or have trouble in your daily routine due to sleepiness.  You continue to have sleep problems or your sleep problems get worse. SEEK IMMEDIATE MEDICAL CARE IF:   You have serious thoughts about hurting yourself or someone else.   This information is not intended to replace advice given to you by your health care provider. Make sure you discuss any questions you have with your health care provider.   Document Released: 07/15/2000 Document Revised: 04/08/2015 Document Reviewed: 04/18/2014 Elsevier Interactive Patient Education 2016 [employer]  

## 2016-02-24 NOTE — Progress Notes (Signed)
HPI: [name] is a 82 y.o. Not Hispanic or Latino female  who presents to [facility] today, 02/24/16,  for chief complaint of:  Chief Complaint  Patient presents with  . Follow-up    BLOOD PRESSURE    Patient here to establish care with me as current PCP will be leaving practice within the next few months.   Hypertension: has had some difficulty finding medication which does not cause side effects but which controls her hypertension. Medications as listed below, though the hydrochlorothiazide she is taking she reports causes some increased urinary frequency, she is able to tolerate this medication otherwise and would like to continue current regimen.  Insomnia: Patient states that she has been taking Xanax at night to help sleeping. Has been on this for many years  Chronic pain: following with pain mgt, last seen 2 months ago, sees her about once per month. Patient is on Sentinel patches and opiate pain medication.   Patient is accompanied by husband who assists with history-taking.   Past medical, surgical, social and family history reviewed: Past Medical History:  Diagnosis Date  . Anxiety   . Arthritis    osteoarthritis  . Asthma   . C. difficile diarrhea   . Candida esophagitis ([place])   . Chronic sinusitis   . Depression   . Depression   . Diverticulosis   . DJD (degenerative joint disease)     L5 compression fracture  . Edema extremities   . Fibromyalgia   . GERD (gastroesophageal reflux disease)   . Gout   . Hyperlipidemia   . Hypertension   . Osteoarthritis   . Osteopenia   . Panic disorder   . Renal failure, unspecified   . Spinal stenosis of lumbar region   . Tubular adenoma of colon    Past Surgical History:  Procedure Laterality Date  . ADENOIDECTOMY    . CARPAL TUNNEL RELEASE  2007, 2009   Bilateral  . CATARACT EXTRACTION, BILATERAL    . CHOANAL ADENIODECTOMY    . DIAGNOSTIC LARYNGOSCOPY N/A 11/05/2015   Procedure: DIAGNOSTIC LARYNGOSCOPY AND ESOPHAGOSCOPY;  Surgeon: Rozetta Nunnery, MD;  Location: [facility];  Service: ENT;  Laterality: N/A;  . ganglion cyct removal on fingers     right  . ganglion cyst  wrist    . KNEE ARTHROSCOPY     right  . NASAL SINUS SURGERY    . SHOULDER SURGERY  2004, 07/30/10, 01/2011   after her right humeral neck fracture, revision hemiarthroplasty 2004 for persistent pain, revision reverse arthroplasty December 2011 for persistent pain, incision and drainage with poly-exchange 01/26/2011 for possible prosthetic joint infection.  . TONSILLECTOMY AND ADENOIDECTOMY    . TOTAL ABDOMINAL HYSTERECTOMY    . TOTAL HIP ARTHROPLASTY  2004   right  . TOTAL KNEE ARTHROPLASTY  2006   right  . TOTAL SHOULDER REPLACEMENT  2002   right   Social History  Substance Use Topics  . Smoking status: Never Smoker  . Smokeless tobacco: Never Used  . Alcohol use No   Family History  Problem Relation Age of Onset  . Breast cancer    . Colon polyps    . Cancer Father     bladder  . Diabetes Mother   . Hypertension Mother   . Depression Son      Current medication list and allergy/intolerance information reviewed:   Current Outpatient Prescriptions  Medication Sig Dispense Refill  . ALPRAZolam (XANAX) 0.5 MG  tablet Take 0.5 mg by mouth 4 (four) times daily as needed for anxiety.     . AMBULATORY NON FORMULARY MEDICATION [place].  Dx: Rheumatoid Arthritis.  Use daily as needed for ambulation. 1 Units 0  . amLODipine (NORVASC) 10 MG tablet Take 1 tablet (10 mg total) by mouth daily. 90 tablet 2  . Calcium Carbonate-Vitamin D (CALCIUM 600+D) 600-400 MG-UNIT per tablet Take 1 tablet by mouth 2 (two) times daily.    . diclofenac sodium (VOLTAREN) 1 % GEL Apply 1 application topically 4 (four) times daily.  0  . diphenhydrAMINE (BENADRYL) 25 MG tablet Take 25 mg by mouth every 6 (six) hours as needed. For allergies    . EPINEPHrine (ADRENACLICK) 0.3 A999333 mL IJ SOAJ  injection Inject 0.3 mLs (0.3 mg total) into the muscle once. 1 Device 1  . escitalopram (LEXAPRO) 20 MG tablet Take 20 mg by mouth daily.    . fentaNYL (DURAGESIC - DOSED MCG/HR) 12 MCG/HR Place 1 patch onto the skin every 3 (three) days.  0  . hydrochlorothiazide (HYDRODIURIL) 25 MG tablet One tablet by mouth every morning for blood pressure control. 30 tablet 2  . ipratropium (ATROVENT) 0.06 % nasal spray Place 2 sprays into both nostrils 3 (three) times daily as needed for rhinitis. 15 mL 12  . loratadine (CLARITIN) 10 MG tablet Take 1 tablet (10 mg total) by mouth daily. 90 tablet 3  . montelukast (SINGULAIR) 10 MG tablet take 1 tablet by mouth at bedtime 30 tablet 3  . omeprazole (PRILOSEC) 20 MG capsule Take 1 capsule (20 mg total) by mouth daily. 90 capsule 1  . ondansetron (ZOFRAN) 4 MG tablet Take 1-2 tablets (4-8 mg total) by mouth every 8 (eight) hours as needed for nausea or vomiting. 30 tablet 1  . oxycodone (OXY-IR) 5 MG capsule Take 10 mg by mouth 4 (four) times daily.     . potassium chloride SA (K-DUR,KLOR-CON) 20 MEQ tablet take 1 tablet by mouth once daily 30 tablet 8  . simvastatin (ZOCOR) 10 MG tablet Take 1 tablet (10 mg total) by mouth daily. 90 tablet 1  . zoledronic acid (RECLAST) 5 MG/100ML SOLN Inject 5 mg into the vein. Once yearly. In June     No current facility-administered medications for this visit.    Allergies  Allergen Reactions  . Bee Venom Anaphylaxis    Has epi-pen  . Ceftin [Cefuroxime Axetil] Anaphylaxis  . Ciprofloxacin Anaphylaxis and Palpitations  . Ephedrine Other (See Comments) and Palpitations    "knocks me out"  . Sulfonamide Derivatives Anaphylaxis  . Telithromycin Anaphylaxis    Reaction: also blurred vision   . Valsartan Anaphylaxis  . Verapamil Anaphylaxis  . Venlafaxine Other (See Comments)    Insomnia, panic  . Beta Adrenergic Blockers     bradycardia  . Cephalosporins     Allergic Reaction  . Clindamycin/Lincomycin     Dry  skin and itching/ Cdiff  . Cymbalta [Duloxetine Hcl] Other (See Comments)    Reaction:Confusion and " I fall down"  . Doxazosin     "blurred vision and irritable"  . Gabapentin Other (See Comments)    Reaction: headache  . Hydralazine     HA, Diarrhea  . Lamotrigine     Patient unsure at this time  . Mirtazapine Other (See Comments)    unknown  . Morphine Other (See Comments)    Medication has no effect with pain  . Oxycodone-Acetaminophen Hives  . Pregabalin Swelling  .  Prochlorperazine Edisylate   . Pseudoephedrine Other (See Comments)    Reaction: hyperventilates and blacks out  . Relafen [Nabumetone] Swelling  . Zoloft [Sertraline] Other (See Comments)    "nervous/jittery"  . Doxycycline Hives      Review of Systems:  Constitutional:  No  fever, no chills, No recent illness  HEENT: No  headache,  Cardiac: No  chest pain  Respiratory:  No  shortness of breath.  Gastrointestinal: No  abdominal pain   Musculoskeletal: No new myalgia/arthralgia  Genitourinary: (+) urinary frequency    Exam:  BP 117/69   Pulse 88   Ht 5' (1.524 m)   Wt 158 lb (71.7 kg)   BMI 30.86 kg/m   Constitutional: VS see above. General Appearance: alert, well-developed, well-nourished, NAD  Ears, Nose, Mouth, Throat: MMM, Normal external inspection ears/nares/mouth/lips/gums.   Neck: No masses, trachea midline.   Respiratory: Normal respiratory effort. no wheeze, no rhonchi, no rales  Cardiovascular: S1/S2 normal, no murmur, no rub/gallop auscultated. RRR.   Musculoskeletal: Pronounced thoracic kyphosis  Psychiatric: Normal judgment/insight. Normal mood and affect. Oriented x3.   Labs reviewed: Recent A1c consistent with prediabetes. Patient has had routine lipid panel in A999333, metabolic panel consistent with mild decreased GFR and mild hyperglycemia   ASSESSMENT/PLAN:   At this point, patient's blood pressure is pretty well controlled, we would have the option I think  to probably go down on the dose of HCTZ, patient states she would rather leave the same for now. Advised bring home blood pressure cuff to next visit with me.  Essential hypertension  Secondary osteoarthritis of multiple sites  Fibromyalgia  Prediabetes  Chronic kidney disease (CKD), stage III (moderate)     Visit summary with medication list and pertinent instructions was printed for patient to review. All questions at time of visit were answered - patient instructed to contact office with any additional concerns. ER/RTC precautions were reviewed with the patient. Follow-up plan: Return in about 3 months (around 05/26/2016) for  [place] and RECHECK BLOOD PRESSURE .  Note: Total time spent 25 minutes, greater than 50% of the visit was spent face-to-face counseling and coordinating care for the following: There were no encounter diagnoses.[name]

## 2016-03-01 DIAGNOSIS — F330 Major depressive disorder, recurrent, mild: Secondary | ICD-10-CM | POA: Diagnosis not present

## 2016-03-01 DIAGNOSIS — F341 Dysthymic disorder: Secondary | ICD-10-CM | POA: Diagnosis not present

## 2016-03-01 DIAGNOSIS — F410 Panic disorder [episodic paroxysmal anxiety] without agoraphobia: Secondary | ICD-10-CM | POA: Diagnosis not present

## 2016-03-02 ENCOUNTER — Other Ambulatory Visit: Payer: Self-pay | Admitting: Osteopathic Medicine

## 2016-03-11 DIAGNOSIS — G4700 Insomnia, unspecified: Secondary | ICD-10-CM | POA: Diagnosis not present

## 2016-03-11 DIAGNOSIS — G894 Chronic pain syndrome: Secondary | ICD-10-CM | POA: Diagnosis not present

## 2016-03-11 DIAGNOSIS — M47816 Spondylosis without myelopathy or radiculopathy, lumbar region: Secondary | ICD-10-CM | POA: Diagnosis not present

## 2016-03-11 DIAGNOSIS — Z79891 Long term (current) use of opiate analgesic: Secondary | ICD-10-CM | POA: Diagnosis not present

## 2016-03-11 DIAGNOSIS — M069 Rheumatoid arthritis, unspecified: Secondary | ICD-10-CM | POA: Diagnosis not present

## 2016-03-18 ENCOUNTER — Encounter: Payer: Self-pay | Admitting: Sports Medicine

## 2016-03-18 ENCOUNTER — Telehealth: Payer: Self-pay | Admitting: Sports Medicine

## 2016-03-18 ENCOUNTER — Ambulatory Visit (INDEPENDENT_AMBULATORY_CARE_PROVIDER_SITE_OTHER): Payer: Medicare Other | Admitting: Sports Medicine

## 2016-03-18 DIAGNOSIS — M1712 Unilateral primary osteoarthritis, left knee: Secondary | ICD-10-CM

## 2016-03-18 NOTE — Telephone Encounter (Signed)
Submitted for approval on Orthovisc. Awaiting confirmation.  

## 2016-03-18 NOTE — Progress Notes (Signed)
  Subjective:    CC: Left knee pain  HPI: This is a pleasant 82 year old female, she has had pain in her left knee for some time now, she has known osteoarthritis and did well with an Orthovisc series about 7 months ago, here with a recurrence of pain, moderate, persistent with swelling, localized to the medial joint line without radiation, no mechanical symptoms, trauma.  Past medical history, Surgical history, Family history not pertinant except as noted below, Social history, Allergies, and medications have been entered into the medical record, reviewed, and no changes needed.   Review of Systems: No fevers, chills, night sweats, weight loss, chest pain, or shortness of breath.   Objective:    General: Well Developed, well nourished, and in no acute distress.  Neuro: Alert and oriented x3, extra-ocular muscles intact, sensation grossly intact.  HEENT: Normocephalic, atraumatic, pupils equal round reactive to light, neck supple, no masses, no lymphadenopathy, thyroid nonpalpable.  Skin: Warm and dry, no rashes. Cardiac: Regular rate and rhythm, no murmurs rubs or gallops, no lower extremity edema.  Respiratory: Clear to auscultation bilaterally. Not using accessory muscles, speaking in full sentences. Left Knee: Visibly swollen with tenderness over the medial joint line. ROM normal in flexion and extension and lower leg rotation. Ligaments with solid consistent endpoints including ACL, PCL, LCL, MCL. Negative Mcmurray's and provocative meniscal tests. Non painful patellar compression. Patellar and quadriceps tendons unremarkable. Hamstring and quadriceps strength is normal.  Procedure: Real-time Ultrasound Guided aspiration/injection of left knee Device: GE Logiq E  Verbal informed consent obtained.  Time-out conducted.  Noted no overlying erythema, induration, or other signs of local infection.  Skin prepped in a sterile fashion.  Local anesthesia: Topical Ethyl chloride.  With  sterile technique and under real time ultrasound guidance:  Aspirated 20 mL of straw-colored fluid, syringe switched and 1 mL Kenalog 40, 2 mL lidocaine, 2 mL Marcaine injected easily. Completed without difficulty  Pain immediately resolved suggesting accurate placement of the medication.  Advised to call if fevers/chills, erythema, induration, drainage, or persistent bleeding.  Images permanently stored and available for review in the ultrasound unit.  Impression: Technically successful ultrasound guided injection.  Impression and Recommendations:    No problem-specific Assessment & Plan notes found for this encounter.

## 2016-03-18 NOTE — Telephone Encounter (Signed)
-----   Message from Silverio Decamp, MD sent at 03/18/2016  8:33 AM EDT ----- Orthovisc approval left knee, failed everything, good response to Orthovisc 7 months ago.

## 2016-03-18 NOTE — Assessment & Plan Note (Signed)
Steroid injection, she will return when Orthovisc is approved, we did an Orthovisc series about 7 months ago.

## 2016-03-23 ENCOUNTER — Encounter: Payer: Self-pay | Admitting: Gastroenterology

## 2016-03-23 NOTE — Telephone Encounter (Signed)
Received the following information from [facility] benefits investigation:  AS PER MEDICARE GUIDELINES, A STANDARD COURSE OF TREATMENT FOR ORTHOVISC is 3-4 INJECTIONS PER KNEE IN A 6 MONTH PERIOD. ADDITIONAL INJECTIONS MAY NOT BE CONSIDERED REASONABLE AND NECESSARY AND MAY DENY. IN ORDER FOR SERVICES TO BE APPROVED THERE MUST BE RADIOLOGICAL EVIDENCE TO SUPPORT THE DIAGNOSIS OF OSTEOARTHRITIS; AND PATIENT MUST HAVE TRIED AND FAILED WITH CONSERVATIVE FORMS OF TREATMENT. PLAN MAY REQUEST MEDICAL NOTES UPON RECEIPT OF CLAIM. FOLLOWS ALL MEDICARE GUIDELINES.   Follow all Medicare guidelines. All services are based on medical necessity. Medicare's deductible must be met for coverage to apply. Secondary policy pays after Medicare and covers up to 100% of [employer] remaining coinsurance & deductible. Medicare Supplements Plan F: Eff: 08/02/2007. Ref# [id number]  Pt advised of coverage details and scheduled for appointments.

## 2016-04-01 ENCOUNTER — Ambulatory Visit (INDEPENDENT_AMBULATORY_CARE_PROVIDER_SITE_OTHER): Payer: Medicare Other | Admitting: Osteopathic Medicine

## 2016-04-01 ENCOUNTER — Encounter: Payer: Self-pay | Admitting: Osteopathic Medicine

## 2016-04-01 VITALS — BP 134/61 | HR 77 | Ht 60.0 in | Wt 162.0 lb

## 2016-04-01 DIAGNOSIS — M797 Fibromyalgia: Secondary | ICD-10-CM

## 2016-04-01 DIAGNOSIS — M069 Rheumatoid arthritis, unspecified: Secondary | ICD-10-CM | POA: Diagnosis not present

## 2016-04-01 DIAGNOSIS — M153 Secondary multiple arthritis: Secondary | ICD-10-CM

## 2016-04-01 MED ORDER — OXYCODONE HCL ER 10 MG PO T12A: 10.0000 mg | EXTENDED_RELEASE_TABLET | Freq: Two times a day (BID) | ORAL | 0 refills | Status: DC

## 2016-04-01 MED ORDER — OXYCODONE HCL ER 15 MG PO T12A: 15.0000 mg | EXTENDED_RELEASE_TABLET | Freq: Two times a day (BID) | ORAL | 0 refills | Status: DC

## 2016-04-01 NOTE — Progress Notes (Signed)
HPI: [name] is a 82 y.o. female  who presents to [facility] today, 04/01/16,  for chief complaint of:  Chief Complaint  Patient presents with  . Generalized Body Aches    Generalized pain/exacerbation of chronic pain . Context: recently "overdoing it" with sick husband last week. Stopped Fentanyl about a 3 weeks ago, transitioned to 4 oxycodone per day.  . Location: Generalized, patient reports chronic joint and back pain, history of fibromyalgia . Quality: Soreness, aching, occasionally sharp pain . Duration: Particularly worse over the past week but worse overall since stopping sentinel . Modifying factors: Recently stopped Fentanyl     Past medical, surgical, social and family history reviewed: Past Medical History:  Diagnosis Date  . Anxiety   . Arthritis    osteoarthritis  . Asthma   . C. difficile diarrhea   . Candida esophagitis ([place])   . Chronic sinusitis   . Depression   . Depression   . Diverticulosis   . DJD (degenerative joint disease)     L5 compression fracture  . Edema extremities   . Fibromyalgia   . GERD (gastroesophageal reflux disease)   . Gout   . Hyperlipidemia   . Hypertension   . Osteoarthritis   . Osteopenia   . Panic disorder   . Renal failure, unspecified   . Spinal stenosis of lumbar region   . Tubular adenoma of colon    Past Surgical History:  Procedure Laterality Date  . ADENOIDECTOMY    . CARPAL TUNNEL RELEASE  2007, 2009   Bilateral  . CATARACT EXTRACTION, BILATERAL    . CHOANAL ADENIODECTOMY    . DIAGNOSTIC LARYNGOSCOPY N/A 11/05/2015   Procedure: DIAGNOSTIC LARYNGOSCOPY AND ESOPHAGOSCOPY;  Surgeon: Rozetta Nunnery, MD;  Location: [facility];  Service: ENT;  Laterality: N/A;  . ganglion cyct removal on fingers     right  . ganglion cyst  wrist    . KNEE ARTHROSCOPY     right  . NASAL SINUS SURGERY    . SHOULDER SURGERY  2004, 07/30/10, 01/2011   after her right humeral neck  fracture, revision hemiarthroplasty 2004 for persistent pain, revision reverse arthroplasty December 2011 for persistent pain, incision and drainage with poly-exchange 01/26/2011 for possible prosthetic joint infection.  . TONSILLECTOMY AND ADENOIDECTOMY    . TOTAL ABDOMINAL HYSTERECTOMY    . TOTAL HIP ARTHROPLASTY  2004   right  . TOTAL KNEE ARTHROPLASTY  2006   right  . TOTAL SHOULDER REPLACEMENT  2002   right   Social History  Substance Use Topics  . Smoking status: Never Smoker  . Smokeless tobacco: Never Used  . Alcohol use No   Family History  Problem Relation Age of Onset  . Bladder Cancer Father   . Diabetes Mother   . Hypertension Mother   . Breast cancer    . Colon polyps    . Depression Son      Current medication list and allergy/intolerance information reviewed:   Current Outpatient Prescriptions  Medication Sig Dispense Refill  . ALPRAZolam (XANAX) 0.5 MG tablet Take 0.5 mg by mouth 4 (four) times daily as needed for anxiety.     . AMBULATORY NON FORMULARY MEDICATION [place].  Dx: Rheumatoid Arthritis.  Use daily as needed for ambulation. 1 Units 0  . amLODipine (NORVASC) 10 MG tablet Take 1 tablet (10 mg total) by mouth daily. 90 tablet 2  . Calcium Carbonate-Vitamin D (CALCIUM 600+D) 600-400 MG-UNIT per tablet  Take 1 tablet by mouth 2 (two) times daily.    . diclofenac sodium (VOLTAREN) 1 % GEL Apply 1 application topically 4 (four) times daily.  0  . diphenhydrAMINE (BENADRYL) 25 MG tablet Take 25 mg by mouth every 6 (six) hours as needed. For allergies    . EPINEPHrine (ADRENACLICK) 0.3 HY/8.6 mL IJ SOAJ injection Inject 0.3 mLs (0.3 mg total) into the muscle once. 1 Device 1  . escitalopram (LEXAPRO) 20 MG tablet Take 20 mg by mouth daily.    . hydrochlorothiazide (HYDRODIURIL) 25 MG tablet One tablet by mouth every morning for blood pressure control. 30 tablet 2  . ipratropium (ATROVENT) 0.06 % nasal spray Place 2 sprays into both nostrils 3 (three)  times daily as needed for rhinitis. 15 mL 12  . loratadine (CLARITIN) 10 MG tablet Take 1 tablet (10 mg total) by mouth daily. 90 tablet 3  . montelukast (SINGULAIR) 10 MG tablet take 1 tablet by mouth at bedtime 30 tablet 3  . omeprazole (PRILOSEC) 20 MG capsule take 1 capsule by mouth once daily 90 capsule 1  . ondansetron (ZOFRAN) 4 MG tablet Take 1-2 tablets (4-8 mg total) by mouth every 8 (eight) hours as needed for nausea or vomiting. 30 tablet 1  . oxycodone (OXY-IR) 5 MG capsule Take 5 mg by mouth 4 (four) times daily.     . potassium chloride SA (K-DUR,KLOR-CON) 20 MEQ tablet take 1 tablet by mouth once daily 30 tablet 8  . simvastatin (ZOCOR) 10 MG tablet Take 1 tablet (10 mg total) by mouth daily. 90 tablet 1  . zoledronic acid (RECLAST) 5 MG/100ML SOLN Inject 5 mg into the vein. Once yearly. In June     No current facility-administered medications for this visit.    Allergies  Allergen Reactions  . Bee Venom Anaphylaxis    Has epi-pen  . Ceftin [Cefuroxime Axetil] Anaphylaxis  . Ciprofloxacin Anaphylaxis and Palpitations  . Ephedrine Other (See Comments) and Palpitations    "knocks me out"  . Sulfonamide Derivatives Anaphylaxis  . Telithromycin Anaphylaxis    Reaction: also blurred vision   . Valsartan Anaphylaxis  . Verapamil Anaphylaxis  . Venlafaxine Other (See Comments)    Insomnia, panic  . Beta Adrenergic Blockers     bradycardia  . Cephalosporins     Allergic Reaction  . Clindamycin/Lincomycin     Dry skin and itching/ Cdiff  . Cymbalta [Duloxetine Hcl] Other (See Comments)    Reaction:Confusion and " I fall down"  . Doxazosin     "blurred vision and irritable"  . Gabapentin Other (See Comments)    Reaction: headache  . Hydralazine     HA, Diarrhea  . Lamotrigine     Patient unsure at this time  . Mirtazapine Other (See Comments)    unknown  . Morphine Other (See Comments)    Medication has no effect with pain  . Oxycodone-Acetaminophen Hives  .  Pregabalin Swelling  . Prochlorperazine Edisylate   . Pseudoephedrine Other (See Comments)    Reaction: hyperventilates and blacks out  . Relafen [Nabumetone] Swelling  . Zoloft [Sertraline] Other (See Comments)    "nervous/jittery"  . Doxycycline Hives      Review of Systems:  Constitutional:  No  fever, no chills  Cardiac: No  chest pain, No  pressure,   Respiratory:  No  shortness of breath. No  Cough  Gastrointestinal: No  abdominal pain  Musculoskeletal: No new myalgia/arthralgia - worsening of chronic back and the  shoulder pain  Skin: No  Rash,  Neurologic: No  weakness, No  dizziness,  Exam:  BP 134/61   Pulse 77   Ht 5' (1.524 m)   Wt 162 lb (73.5 kg)   BMI 31.64 kg/m   Constitutional: VS see above. General Appearance: alert, well-developed, well-nourished, NAD  Eyes: Normal lids and conjunctive, non-icteric sclera  Ears, Nose, Mouth, Throat: MMM, Normal external inspection ears/nares/mouth/lips/gums.  Neck: No masses, trachea midline.   Respiratory: Normal respiratory effort. no wheeze, no rhonchi, no rales  Cardiovascular: S1/S2 normal, no murmur, no rub/gallop auscultated. RRR. No lower extremity edema.  Skin: warm, dry, intact. No rash/ulcer. No concerning nevi or subq nodules on limited exam.    Psychiatric: Normal judgment/insight. Depressed mood and affect. Oriented x3.      ASSESSMENT/PLAN:   Printed for patient to take to her pain management doctor, I think she may need to be put back on the fentanyl but I'm not going to restart this medicine for her today. Assuming that she has a controlled substance contract with her pain specialist, I cautioned the patient that any prescription from me may violate this contract so she should be sure to give her pain management doctor called about this issue.   I'm okay to prescribe extended release oxycodone at 20 mg per day opiate equivalents that she is currently taking on 5 mg 4 per day. If this  medication is prohibitively expensive, she would probably be okay to take increased dose of the IR oxycodone but I would not do this 4 times per day, limited to once per day and caution due to increased risk of sedation.  Toradol was given in the office IM today, there was a reaction coming up as she has a history of adverse effects with Namenda, patient doesn't remember any problems with Toradol the past but isn't sure if she's had this medicine, she is tolerated other NSAIDs without allergy or adverse event  Patient has been educated on significant possible side effects of medication, particularly dangerous with sedation effects of opiate medications, and is instructed to contact me or other medical professional with any concerns about side effects or seek emergency care if she is experiencing any problems.   Fibromyalgia  Rheumatoid arthritis of hand, unspecified laterality, unspecified rheumatoid factor presence (HCC)  Secondary osteoarthritis of multiple sites    Visit summary with medication list and pertinent instructions was printed for patient to review. All questions at time of visit were answered - patient instructed to contact office with any additional concerns. ER/RTC precautions were reviewed with the patient. Follow-up plan: Return sooner if needed, for routine care as directed.  Note: Total time spent 25 minutes, greater than 50% of the visit was spent face-to-face counseling and coordinating care for the following: The primary encounter diagnosis was Rheumatoid arthritis of hand, unspecified laterality, unspecified rheumatoid factor presence ([place]). Diagnoses of Secondary osteoarthritis of multiple sites and Fibromyalgia were also pertinent to this visit.[name]

## 2016-04-01 NOTE — Patient Instructions (Addendum)
You've been given a written letter to present to pain management which explains the new prescriptions you are given today.  We have transitioned to extended release oxycodone at 10 mg every 12 hours, this will be equivalent to the 5 mg that you are taking.   If the extended release formulation is prohibitively expensive, please just continue with the immediate release 4 times per day as directed by pain management. If your pain is severe, you can try 2 tablets at a time, but do not do this with every dose and do not do this more than once per day.

## 2016-04-06 DIAGNOSIS — G894 Chronic pain syndrome: Secondary | ICD-10-CM | POA: Diagnosis not present

## 2016-04-06 DIAGNOSIS — M47816 Spondylosis without myelopathy or radiculopathy, lumbar region: Secondary | ICD-10-CM | POA: Diagnosis not present

## 2016-04-06 DIAGNOSIS — G4700 Insomnia, unspecified: Secondary | ICD-10-CM | POA: Diagnosis not present

## 2016-04-06 DIAGNOSIS — M069 Rheumatoid arthritis, unspecified: Secondary | ICD-10-CM | POA: Diagnosis not present

## 2016-04-12 ENCOUNTER — Ambulatory Visit (INDEPENDENT_AMBULATORY_CARE_PROVIDER_SITE_OTHER): Payer: Medicare Other | Admitting: Sports Medicine

## 2016-04-12 DIAGNOSIS — M1712 Unilateral primary osteoarthritis, left knee: Secondary | ICD-10-CM

## 2016-04-12 NOTE — Progress Notes (Signed)

## 2016-04-12 NOTE — Assessment & Plan Note (Signed)
Orthovisc injection before into the left knee, return in one week for #2.

## 2016-04-14 DIAGNOSIS — F341 Dysthymic disorder: Secondary | ICD-10-CM | POA: Diagnosis not present

## 2016-04-14 DIAGNOSIS — F330 Major depressive disorder, recurrent, mild: Secondary | ICD-10-CM | POA: Diagnosis not present

## 2016-04-14 DIAGNOSIS — F410 Panic disorder [episodic paroxysmal anxiety] without agoraphobia: Secondary | ICD-10-CM | POA: Diagnosis not present

## 2016-04-19 ENCOUNTER — Encounter: Payer: Self-pay | Admitting: Sports Medicine

## 2016-04-19 ENCOUNTER — Ambulatory Visit (INDEPENDENT_AMBULATORY_CARE_PROVIDER_SITE_OTHER): Payer: Medicare Other | Admitting: Sports Medicine

## 2016-04-19 DIAGNOSIS — M1712 Unilateral primary osteoarthritis, left knee: Secondary | ICD-10-CM | POA: Diagnosis not present

## 2016-04-19 NOTE — Assessment & Plan Note (Signed)
Injection #2 of 4 into the left knee, return in one week for #3 of 4.

## 2016-04-19 NOTE — Addendum Note (Signed)
Addended by: Elizabeth Sauer on: 04/19/2016 01:46 PM   Modules accepted: Orders

## 2016-04-19 NOTE — Progress Notes (Signed)

## 2016-04-20 DIAGNOSIS — Z23 Encounter for immunization: Secondary | ICD-10-CM | POA: Diagnosis not present

## 2016-04-26 ENCOUNTER — Encounter: Payer: Self-pay | Admitting: Sports Medicine

## 2016-04-26 ENCOUNTER — Ambulatory Visit (INDEPENDENT_AMBULATORY_CARE_PROVIDER_SITE_OTHER): Payer: Medicare Other | Admitting: Sports Medicine

## 2016-04-26 DIAGNOSIS — M1712 Unilateral primary osteoarthritis, left knee: Secondary | ICD-10-CM | POA: Diagnosis not present

## 2016-04-26 NOTE — Progress Notes (Signed)

## 2016-04-26 NOTE — Assessment & Plan Note (Addendum)
Injection #3 of 4 into the left knee, return in one week for #4 of 4. Home health PT.

## 2016-04-28 DIAGNOSIS — G894 Chronic pain syndrome: Secondary | ICD-10-CM | POA: Diagnosis not present

## 2016-04-28 DIAGNOSIS — M47816 Spondylosis without myelopathy or radiculopathy, lumbar region: Secondary | ICD-10-CM | POA: Diagnosis not present

## 2016-04-28 DIAGNOSIS — G4700 Insomnia, unspecified: Secondary | ICD-10-CM | POA: Diagnosis not present

## 2016-04-28 DIAGNOSIS — M069 Rheumatoid arthritis, unspecified: Secondary | ICD-10-CM | POA: Diagnosis not present

## 2016-05-02 ENCOUNTER — Other Ambulatory Visit: Payer: Self-pay | Admitting: Family Medicine

## 2016-05-03 ENCOUNTER — Ambulatory Visit (INDEPENDENT_AMBULATORY_CARE_PROVIDER_SITE_OTHER): Payer: Medicare Other | Admitting: Sports Medicine

## 2016-05-03 ENCOUNTER — Encounter: Payer: Self-pay | Admitting: Sports Medicine

## 2016-05-03 DIAGNOSIS — M1712 Unilateral primary osteoarthritis, left knee: Secondary | ICD-10-CM

## 2016-05-03 NOTE — Progress Notes (Signed)

## 2016-05-03 NOTE — Assessment & Plan Note (Signed)
Orthovisc injection #4 of 4 into the left knee, return in one month. Continue with home health PT.

## 2016-05-06 ENCOUNTER — Other Ambulatory Visit: Payer: Self-pay

## 2016-05-06 DIAGNOSIS — M1712 Unilateral primary osteoarthritis, left knee: Secondary | ICD-10-CM

## 2016-05-12 DIAGNOSIS — K219 Gastro-esophageal reflux disease without esophagitis: Secondary | ICD-10-CM | POA: Diagnosis not present

## 2016-05-12 DIAGNOSIS — M069 Rheumatoid arthritis, unspecified: Secondary | ICD-10-CM | POA: Diagnosis not present

## 2016-05-12 DIAGNOSIS — Z9181 History of falling: Secondary | ICD-10-CM | POA: Diagnosis not present

## 2016-05-12 DIAGNOSIS — J45909 Unspecified asthma, uncomplicated: Secondary | ICD-10-CM | POA: Diagnosis not present

## 2016-05-12 DIAGNOSIS — I10 Essential (primary) hypertension: Secondary | ICD-10-CM | POA: Diagnosis not present

## 2016-05-12 DIAGNOSIS — M545 Low back pain: Secondary | ICD-10-CM | POA: Diagnosis not present

## 2016-05-12 DIAGNOSIS — G8929 Other chronic pain: Secondary | ICD-10-CM | POA: Diagnosis not present

## 2016-05-12 DIAGNOSIS — F419 Anxiety disorder, unspecified: Secondary | ICD-10-CM | POA: Diagnosis not present

## 2016-05-12 DIAGNOSIS — M1712 Unilateral primary osteoarthritis, left knee: Secondary | ICD-10-CM | POA: Diagnosis not present

## 2016-05-16 DIAGNOSIS — F331 Major depressive disorder, recurrent, moderate: Secondary | ICD-10-CM | POA: Diagnosis not present

## 2016-05-16 DIAGNOSIS — F410 Panic disorder [episodic paroxysmal anxiety] without agoraphobia: Secondary | ICD-10-CM | POA: Diagnosis not present

## 2016-05-16 DIAGNOSIS — F341 Dysthymic disorder: Secondary | ICD-10-CM | POA: Diagnosis not present

## 2016-05-17 DIAGNOSIS — M1712 Unilateral primary osteoarthritis, left knee: Secondary | ICD-10-CM | POA: Diagnosis not present

## 2016-05-17 DIAGNOSIS — J45909 Unspecified asthma, uncomplicated: Secondary | ICD-10-CM | POA: Diagnosis not present

## 2016-05-17 DIAGNOSIS — M545 Low back pain: Secondary | ICD-10-CM | POA: Diagnosis not present

## 2016-05-17 DIAGNOSIS — I10 Essential (primary) hypertension: Secondary | ICD-10-CM | POA: Diagnosis not present

## 2016-05-17 DIAGNOSIS — G8929 Other chronic pain: Secondary | ICD-10-CM | POA: Diagnosis not present

## 2016-05-17 DIAGNOSIS — M069 Rheumatoid arthritis, unspecified: Secondary | ICD-10-CM | POA: Diagnosis not present

## 2016-05-20 ENCOUNTER — Ambulatory Visit (INDEPENDENT_AMBULATORY_CARE_PROVIDER_SITE_OTHER): Payer: Medicare Other | Admitting: Family Medicine

## 2016-05-20 ENCOUNTER — Encounter: Payer: Self-pay | Admitting: Family Medicine

## 2016-05-20 VITALS — BP 133/65 | HR 85 | Wt 161.0 lb

## 2016-05-20 DIAGNOSIS — I10 Essential (primary) hypertension: Secondary | ICD-10-CM | POA: Diagnosis not present

## 2016-05-20 DIAGNOSIS — G8929 Other chronic pain: Secondary | ICD-10-CM | POA: Diagnosis not present

## 2016-05-20 DIAGNOSIS — M1712 Unilateral primary osteoarthritis, left knee: Secondary | ICD-10-CM | POA: Diagnosis not present

## 2016-05-20 DIAGNOSIS — M069 Rheumatoid arthritis, unspecified: Secondary | ICD-10-CM | POA: Diagnosis not present

## 2016-05-20 DIAGNOSIS — M7061 Trochanteric bursitis, right hip: Secondary | ICD-10-CM | POA: Diagnosis not present

## 2016-05-20 DIAGNOSIS — M545 Low back pain, unspecified: Secondary | ICD-10-CM

## 2016-05-20 DIAGNOSIS — J45909 Unspecified asthma, uncomplicated: Secondary | ICD-10-CM | POA: Diagnosis not present

## 2016-05-20 NOTE — Progress Notes (Signed)
[name] is a 82 y.o. female who presents to [facility] today for back pain. Patient has a history of chronic back pain. She currently is receiving fentanyl patches intermittently oral narcotics through pain management. Back pain is moderate to severe. Pain is been worsening recently because her husband is sick and she has to do more activities at home. She notes she does not receive much help from her family and cannot afford personal care services. She denies any weakness or numbness.  Additionally she notes right lateral hip pain. This is previously been diagnosed as greater trochanteric bursitis. She's received 1 visit with home health physical therapy which has not helped very much yet.   Past Medical History:  Diagnosis Date  . Anxiety   . Arthritis    osteoarthritis  . Asthma   . C. difficile diarrhea   . Candida esophagitis ([place])   . Chronic sinusitis   . Depression   . Depression   . Diverticulosis   . DJD (degenerative joint disease)     L5 compression fracture  . Edema extremities   . Fibromyalgia   . GERD (gastroesophageal reflux disease)   . Gout   . Hyperlipidemia   . Hypertension   . Osteoarthritis   . Osteopenia   . Panic disorder   . Renal failure, unspecified   . Spinal stenosis of lumbar region   . Tubular adenoma of colon    Past Surgical History:  Procedure Laterality Date  . ADENOIDECTOMY    . CARPAL TUNNEL RELEASE  2007, 2009   Bilateral  . CATARACT EXTRACTION, BILATERAL    . CHOANAL ADENIODECTOMY    . DIAGNOSTIC LARYNGOSCOPY N/A 11/05/2015   Procedure: DIAGNOSTIC LARYNGOSCOPY AND ESOPHAGOSCOPY;  Surgeon: Rozetta Nunnery, MD;  Location: [facility];  Service: ENT;  Laterality: N/A;  . ganglion cyct removal on fingers     right  . ganglion cyst  wrist    . KNEE ARTHROSCOPY     right  . NASAL SINUS SURGERY    . SHOULDER  SURGERY  2004, 07/30/10, 01/2011   after her right humeral neck fracture, revision hemiarthroplasty 2004 for persistent pain, revision reverse arthroplasty December 2011 for persistent pain, incision and drainage with poly-exchange 01/26/2011 for possible prosthetic joint infection.  . TONSILLECTOMY AND ADENOIDECTOMY    . TOTAL ABDOMINAL HYSTERECTOMY    . TOTAL HIP ARTHROPLASTY  2004   right  . TOTAL KNEE ARTHROPLASTY  2006   right  . TOTAL SHOULDER REPLACEMENT  2002   right   Social History  Substance Use Topics  . Smoking status: Never Smoker  . Smokeless tobacco: Never Used  . Alcohol use No   family history includes Bladder Cancer in her father; Depression in her son; Diabetes in her mother; Hypertension in her mother.  ROS as above:  Medications: Current Outpatient Prescriptions  Medication Sig Dispense Refill  . ALPRAZolam (XANAX) 0.5 MG tablet Take 0.5 mg by mouth 4 (four) times daily as needed for anxiety.     . AMBULATORY NON FORMULARY MEDICATION [place].  Dx: Rheumatoid Arthritis.  Use daily as needed for ambulation. 1 Units 0  . amLODipine (NORVASC) 10 MG tablet Take 1 tablet (10 mg total) by mouth daily. 90 tablet 2  . Calcium Carbonate-Vitamin D (CALCIUM 600+D) 600-400 MG-UNIT per tablet Take 1 tablet by mouth 2 (two) times daily.    . diclofenac sodium (VOLTAREN) 1 % GEL  Apply 1 application topically 4 (four) times daily.  0  . diphenhydrAMINE (BENADRYL) 25 MG tablet Take 25 mg by mouth every 6 (six) hours as needed. For allergies    . EPINEPHrine (ADRENACLICK) 0.3 [id number] mL IJ SOAJ injection Inject 0.3 mLs (0.3 mg total) into the muscle once. 1 Device 1  . escitalopram (LEXAPRO) 20 MG tablet Take 20 mg by mouth daily.    . hydrochlorothiazide (HYDRODIURIL) 25 MG tablet One tablet by mouth every morning for blood pressure control. 30 tablet 2  . ipratropium (ATROVENT) 0.06 % nasal spray Place 2 sprays into both nostrils 3 (three) times daily as needed for  rhinitis. 15 mL 12  . loratadine (CLARITIN) 10 MG tablet Take 1 tablet (10 mg total) by mouth daily. 90 tablet 3  . montelukast (SINGULAIR) 10 MG tablet take 1 tablet by mouth at bedtime 30 tablet 3  . omeprazole (PRILOSEC) 20 MG capsule take 1 capsule by mouth once daily 90 capsule 1  . ondansetron (ZOFRAN) 4 MG tablet Take 1-2 tablets (4-8 mg total) by mouth every 8 (eight) hours as needed for nausea or vomiting. 30 tablet 1  . oxyCODONE (OXY IR/ROXICODONE) 5 MG immediate release tablet take 1 and 1/2 tablets by mouth every 6 hours if needed for pain  0  . potassium chloride SA (K-DUR,KLOR-CON) 20 MEQ tablet take 1 tablet by mouth once daily 30 tablet 8  . simvastatin (ZOCOR) 10 MG tablet Take 1 tablet (10 mg total) by mouth daily. 90 tablet 1  . zoledronic acid (RECLAST) 5 MG/100ML SOLN Inject 5 mg into the vein. Once yearly. In June     No current facility-administered medications for this visit.    Allergies  Allergen Reactions  . Bee Venom Anaphylaxis    Has epi-pen  . Ceftin [Cefuroxime Axetil] Anaphylaxis  . Ciprofloxacin Anaphylaxis and Palpitations  . Ephedrine Other (See Comments) and Palpitations    "knocks me out"  . Sulfonamide Derivatives Anaphylaxis  . Telithromycin Anaphylaxis    Reaction: also blurred vision   . Valsartan Anaphylaxis  . Verapamil Anaphylaxis  . Venlafaxine Other (See Comments)    Insomnia, panic  . Beta Adrenergic Blockers     bradycardia  . Cephalosporins     Allergic Reaction  . Clindamycin/Lincomycin     Dry skin and itching/ Cdiff  . Cymbalta [Duloxetine Hcl] Other (See Comments)    Reaction:Confusion and " I fall down"  . Doxazosin     "blurred vision and irritable"  . Gabapentin Other (See Comments)    Reaction: headache  . Hydralazine     HA, Diarrhea  . Lamotrigine     Patient unsure at this time  . Mirtazapine Other (See Comments)    unknown  . Morphine Other (See Comments)    Medication has no effect with pain  .  Oxycodone-Acetaminophen Hives  . Pregabalin Swelling  . Prochlorperazine Edisylate   . Pseudoephedrine Other (See Comments)    Reaction: hyperventilates and blacks out  . Relafen [Nabumetone] Swelling  . Zoloft [Sertraline] Other (See Comments)    "nervous/jittery"  . Doxycycline Hives    Health Maintenance Health Maintenance  Topic Date Due  . INFLUENZA VACCINE  03/01/2016  . ZOSTAVAX  02/23/2017 (Originally 04/28/1995)  . PNA vac Low Risk Adult (2 of 2 - PPSV23) 12/29/2016  . TETANUS/TDAP  02/20/2020  . DEXA SCAN  Completed     Exam:  BP 133/65   Pulse 85   Wt 161 lb (73 kg)  BMI 31.44 kg/m  Gen: Well NAD HEENT: EOMI,  MMM Lungs: Normal work of breathing. CTABL Heart: RRR no MRG Abd: NABS, Soft. Nondistended, Nontender Exts: Brisk capillary refill, warm and well perfused.  Back: Nontender to midline. Tender palpation lumbar paraspinals. Patient walks with a flexed position using a Nardo area and lower extremity sensation and strength is intact. Right hip: Normal-appearing with normal hip motion. Tender palpation greater trochanter.   No results found for this or any previous visit (from the past 72 hour(s)). No results found.    Assessment and Plan: 82 y.o. female with  Chronic back pain: Continue management with pain management. Recommend continued physical therapy. Recommend family meeting in the near future to get a little more help at home.  Right lateral hip pain: Greater trochanteric bursitis. Continue physical therapy. Recheck in a month if not better.  Patient notes she would like to transition her primary care needs to [facility] as her previous PCP has left the clinic.   No orders of the defined types were placed in this encounter.   Discussed warning signs or symptoms. Please see discharge instructions. Patient expresses understanding.

## 2016-05-20 NOTE — Patient Instructions (Signed)
Thank you for coming in today. Return as needed.  I recommend following up with Luvenia Starch for your PCP.  I think getting more help at home will help your back.  Your hip is due to bursitis.  Continue PT.  Recheck with me to Dr T in 1 month about your hip.    Trochanteric Bursitis You have hip pain due to trochanteric bursitis. Bursitis means that the sack near the outside of the hip is filled with fluid and inflamed. This sack is made up of protective soft tissue. The pain from trochanteric bursitis can be severe and keep you from sleep. It can radiate to the buttocks or down the outside of the thigh to the knee. The pain is almost always worse when rising from the seated or lying position and with walking. Pain can improve after you take a few steps. It happens more often in people with hip joint and lumbar spine problems, such as arthritis or previous surgery. Very rarely the trochanteric bursa can become infected, and antibiotics and/or surgery may be needed. Treatment often includes an injection of local anesthetic mixed with cortisone medicine. This medicine is injected into the area where it is most tender over the hip. Repeat injections may be necessary if the response to treatment is slow. You can apply ice packs over the tender area for 30 minutes every 2 hours for the next few days. Anti-inflammatory and/or narcotic pain medicine may also be helpful. Limit your activity for the next few days if the pain continues. See your caregiver in 5-10 days if you are not greatly improved.  SEEK IMMEDIATE MEDICAL CARE IF:  You develop severe pain, fever, or increased redness.  You have pain that radiates below the knee. EXERCISES STRETCHING EXERCISES - Trochanteric Bursitis  These exercises may help you when beginning to rehabilitate your injury. Your symptoms may resolve with or without further involvement from your physician, physical therapist, or athletic trainer. While completing these exercises,  remember:   Restoring tissue flexibility helps normal motion to return to the joints. This allows healthier, less painful movement and activity.  An effective stretch should be held for at least 30 seconds.  A stretch should never be painful. You should only feel a gentle lengthening or release in the stretched tissue. STRETCH - Iliotibial Band  On the floor or bed, lie on your side so your injured leg is on top. Bend your knee and grab your ankle.  Slowly bring your knee back so that your thigh is in line with your trunk. Keep your heel at your buttocks and gently arch your back so your head, shoulders and hips line up.  Slowly lower your leg so that your knee approaches the floor/bed until you feel a gentle stretch on the outside of your thigh. If you do not feel a stretch and your knee will not fall farther, place the heel of your opposite foot on top of your knee and pull your thigh down farther.  Hold this stretch for __________ seconds.  Repeat __________ times. Complete this exercise __________ times per day. STRETCH - Hamstrings, Supine   Lie on your back. Loop a belt or towel over the ball of your foot as shown.  Straighten your knee and slowly pull on the belt to raise your injured leg. Do not allow the knee to bend. Keep your opposite leg flat on the floor.  Raise the leg until you feel a gentle stretch behind your knee or thigh. Hold this position  for __________ seconds.  Repeat __________ times. Complete this stretch __________ times per day. STRETCH - Quadriceps, Prone   Lie on your stomach on a firm surface, such as a bed or padded floor.  Bend your knee and grasp your ankle. If you are unable to reach your ankle or pant leg, use a belt around your foot to lengthen your reach.  Gently pull your heel toward your buttocks. Your knee should not slide out to the side. You should feel a stretch in the front of your thigh and/or knee.  Hold this position for __________  seconds.  Repeat __________ times. Complete this stretch __________ times per day. STRETCHING - Hip Flexors, Lunge Half kneel with your knee on the floor and your opposite knee bent and directly over your ankle.  Keep good posture with your head over your shoulders. Tighten your buttocks to point your tailbone downward; this will prevent your back from arching too much.  You should feel a gentle stretch in the front of your thigh and/or hip. If you do not feel any resistance, slightly slide your opposite foot forward and then slowly lunge forward so your knee once again lines up over your ankle. Be sure your tailbone remains pointed downward.  Hold this stretch for __________ seconds.  Repeat __________ times. Complete this stretch __________ times per day. STRETCH - Adductors, Lunge  While standing, spread your legs.  Lean away from your injured leg by bending your opposite knee. You may rest your hands on your thigh for balance.  You should feel a stretch in your inner thigh. Hold for __________ seconds.  Repeat __________ times. Complete this exercise __________ times per day.   This information is not intended to replace advice given to you by your health care provider. Make sure you discuss any questions you have with your health care provider.   Document Released: 08/25/2004 Document Revised: 12/02/2014 Document Reviewed: 10/30/2008 Elsevier Interactive Patient Education [employer].

## 2016-05-23 ENCOUNTER — Ambulatory Visit: Payer: Self-pay | Admitting: Family Medicine

## 2016-05-23 DIAGNOSIS — M545 Low back pain: Secondary | ICD-10-CM | POA: Diagnosis not present

## 2016-05-23 DIAGNOSIS — M1712 Unilateral primary osteoarthritis, left knee: Secondary | ICD-10-CM | POA: Diagnosis not present

## 2016-05-23 DIAGNOSIS — G8929 Other chronic pain: Secondary | ICD-10-CM | POA: Diagnosis not present

## 2016-05-23 DIAGNOSIS — M069 Rheumatoid arthritis, unspecified: Secondary | ICD-10-CM | POA: Diagnosis not present

## 2016-05-23 DIAGNOSIS — J45909 Unspecified asthma, uncomplicated: Secondary | ICD-10-CM | POA: Diagnosis not present

## 2016-05-23 DIAGNOSIS — I10 Essential (primary) hypertension: Secondary | ICD-10-CM | POA: Diagnosis not present

## 2016-05-25 DIAGNOSIS — G894 Chronic pain syndrome: Secondary | ICD-10-CM | POA: Diagnosis not present

## 2016-05-25 DIAGNOSIS — M47816 Spondylosis without myelopathy or radiculopathy, lumbar region: Secondary | ICD-10-CM | POA: Diagnosis not present

## 2016-05-25 DIAGNOSIS — F410 Panic disorder [episodic paroxysmal anxiety] without agoraphobia: Secondary | ICD-10-CM | POA: Diagnosis not present

## 2016-05-25 DIAGNOSIS — F341 Dysthymic disorder: Secondary | ICD-10-CM | POA: Diagnosis not present

## 2016-05-25 DIAGNOSIS — F331 Major depressive disorder, recurrent, moderate: Secondary | ICD-10-CM | POA: Diagnosis not present

## 2016-05-25 DIAGNOSIS — M069 Rheumatoid arthritis, unspecified: Secondary | ICD-10-CM | POA: Diagnosis not present

## 2016-05-25 DIAGNOSIS — G4700 Insomnia, unspecified: Secondary | ICD-10-CM | POA: Diagnosis not present

## 2016-05-26 ENCOUNTER — Encounter: Payer: Self-pay | Admitting: Osteopathic Medicine

## 2016-05-26 DIAGNOSIS — M545 Low back pain: Secondary | ICD-10-CM | POA: Diagnosis not present

## 2016-05-26 DIAGNOSIS — G8929 Other chronic pain: Secondary | ICD-10-CM | POA: Diagnosis not present

## 2016-05-26 DIAGNOSIS — I10 Essential (primary) hypertension: Secondary | ICD-10-CM | POA: Diagnosis not present

## 2016-05-26 DIAGNOSIS — M1712 Unilateral primary osteoarthritis, left knee: Secondary | ICD-10-CM | POA: Diagnosis not present

## 2016-05-26 DIAGNOSIS — J45909 Unspecified asthma, uncomplicated: Secondary | ICD-10-CM | POA: Diagnosis not present

## 2016-05-26 DIAGNOSIS — M069 Rheumatoid arthritis, unspecified: Secondary | ICD-10-CM | POA: Diagnosis not present

## 2016-05-27 ENCOUNTER — Encounter: Payer: Self-pay | Admitting: Physician Assistant

## 2016-05-29 ENCOUNTER — Encounter: Payer: Self-pay | Admitting: Emergency Medicine

## 2016-05-29 ENCOUNTER — Emergency Department (INDEPENDENT_AMBULATORY_CARE_PROVIDER_SITE_OTHER)
Admission: EM | Admit: 2016-05-29 | Discharge: 2016-05-29 | Disposition: A | Discharge status: Home / Self Care | Payer: Medicare Other | Source: Home / Self Care | Attending: Family Medicine | Admitting: Family Medicine

## 2016-05-29 DIAGNOSIS — R5382 Chronic fatigue, unspecified: Secondary | ICD-10-CM | POA: Diagnosis not present

## 2016-05-29 DIAGNOSIS — R5381 Other malaise: Secondary | ICD-10-CM | POA: Diagnosis not present

## 2016-05-29 NOTE — ED Triage Notes (Signed)
Patient concerned about headaches and dizziness over past week; recent changes to meds may be responsible; lexapro and fentanyl are the ones she is questioning. She has appt.with [facility] 06-07-16.

## 2016-05-30 DIAGNOSIS — M069 Rheumatoid arthritis, unspecified: Secondary | ICD-10-CM | POA: Diagnosis not present

## 2016-05-30 DIAGNOSIS — J45909 Unspecified asthma, uncomplicated: Secondary | ICD-10-CM | POA: Diagnosis not present

## 2016-05-30 DIAGNOSIS — M1712 Unilateral primary osteoarthritis, left knee: Secondary | ICD-10-CM | POA: Diagnosis not present

## 2016-05-30 DIAGNOSIS — G8929 Other chronic pain: Secondary | ICD-10-CM | POA: Diagnosis not present

## 2016-05-30 DIAGNOSIS — I10 Essential (primary) hypertension: Secondary | ICD-10-CM | POA: Diagnosis not present

## 2016-05-30 DIAGNOSIS — M545 Low back pain: Secondary | ICD-10-CM | POA: Diagnosis not present

## 2016-05-31 ENCOUNTER — Ambulatory Visit: Payer: Self-pay | Admitting: Physician Assistant

## 2016-06-01 DIAGNOSIS — G8929 Other chronic pain: Secondary | ICD-10-CM | POA: Diagnosis not present

## 2016-06-01 DIAGNOSIS — M1712 Unilateral primary osteoarthritis, left knee: Secondary | ICD-10-CM | POA: Diagnosis not present

## 2016-06-01 DIAGNOSIS — J45909 Unspecified asthma, uncomplicated: Secondary | ICD-10-CM | POA: Diagnosis not present

## 2016-06-01 DIAGNOSIS — M545 Low back pain: Secondary | ICD-10-CM | POA: Diagnosis not present

## 2016-06-01 DIAGNOSIS — M069 Rheumatoid arthritis, unspecified: Secondary | ICD-10-CM | POA: Diagnosis not present

## 2016-06-01 DIAGNOSIS — I10 Essential (primary) hypertension: Secondary | ICD-10-CM | POA: Diagnosis not present

## 2016-06-02 DIAGNOSIS — H04123 Dry eye syndrome of bilateral lacrimal glands: Secondary | ICD-10-CM | POA: Diagnosis not present

## 2016-06-02 DIAGNOSIS — H26493 Other secondary cataract, bilateral: Secondary | ICD-10-CM | POA: Diagnosis not present

## 2016-06-05 NOTE — ED Provider Notes (Signed)
[facility]    CSN: [id number] Arrival date & time: 05/29/16  1430     History   Chief Complaint Chief Complaint  Patient presents with  . Dizziness  . Code Stroke    HPI [name] is a 82 y.o. female.   Patient states that her Lexapro was increased from 20mg  to 40mg  daily about 2 weeks ago, and she has felt worse since then with increased headache and dizziness.  She decreased her Lexapro back to 20mg  daily 3 days ago, but still does not feel as well as she would like.  She complains of persistent mild nausea and intermittent mild frontal headache without localizing neurologic symptoms.  She is also wondering if fentanyl patch is contributing to her symptoms.   The history is provided by a relative and the patient.    Past Medical History:  Diagnosis Date  . Anxiety   . Arthritis    osteoarthritis  . Asthma   . C. difficile diarrhea   . Candida esophagitis ([place])   . Chronic sinusitis   . Depression   . Depression   . Diverticulosis   . DJD (degenerative joint disease)     L5 compression fracture  . Edema extremities   . Fibromyalgia   . GERD (gastroesophageal reflux disease)   . Gout   . Hyperlipidemia   . Hypertension   . Osteoarthritis   . Osteopenia   . Panic disorder   . Renal failure, unspecified   . Spinal stenosis of lumbar region   . Tubular adenoma of colon     Patient Active Problem List   Diagnosis Date Noted  . Prediabetes 02/24/2016  . Chronic kidney disease (CKD), stage III (moderate) 02/24/2016  . Intertrigo 08/14/2015  . Weakness 04/20/2015  . Elevated LFTs 03/10/2015  . Seasonal allergies 02/24/2015  . Primary osteoarthritis of left knee 01/01/2015  . Right ankle pain 10/09/2014  . Constipation 05/23/2014  . Left shoulder pain 04/30/2014  . Osteoporosis 03/12/2014  . Personal history of colonic polyps 03/12/2014  . Chronic rheumatic arthritis ([place]) 02/19/2014  . Anxiety and depression 02/19/2014  . Bradycardia  06/03/2013  . Edema extremities   . GERD (gastroesophageal reflux disease)   . Cystitis, acute hemorrhagic 04/14/2011  . HTN (hypertension) 03/31/2011  . Dyslipidemia 03/31/2011  . Depression 03/31/2011  . Fibromyalgia 03/31/2011  . DJD (degenerative joint disease) 03/31/2011  . Humeral fracture 03/31/2011  . Post-operative infection 03/31/2011  . IBD 09/27/2010  . GERD 09/24/2010  . COUGH, CHRONIC 09/24/2010  . DIARRHEA 09/24/2010    Past Surgical History:  Procedure Laterality Date  . ADENOIDECTOMY    . CARPAL TUNNEL RELEASE  2007, 2009   Bilateral  . CATARACT EXTRACTION, BILATERAL    . CHOANAL ADENIODECTOMY    . DIAGNOSTIC LARYNGOSCOPY N/A 11/05/2015   Procedure: DIAGNOSTIC LARYNGOSCOPY AND ESOPHAGOSCOPY;  Surgeon: Rozetta Nunnery, MD;  Location: [facility];  Service: ENT;  Laterality: N/A;  . ganglion cyct removal on fingers     right  . ganglion cyst  wrist    . KNEE ARTHROSCOPY     right  . NASAL SINUS SURGERY    . SHOULDER SURGERY  2004, 07/30/10, 01/2011   after her right humeral neck fracture, revision hemiarthroplasty 2004 for persistent pain, revision reverse arthroplasty December 2011 for persistent pain, incision and drainage with poly-exchange 01/26/2011 for possible prosthetic joint infection.  . TONSILLECTOMY AND ADENOIDECTOMY    . TOTAL ABDOMINAL HYSTERECTOMY    . TOTAL  HIP ARTHROPLASTY  2004   right  . TOTAL KNEE ARTHROPLASTY  2006   right  . TOTAL SHOULDER REPLACEMENT  2002   right    OB History    No data available       Home Medications    Prior to Admission medications   Medication Sig Start Date End Date Taking? Authorizing Provider  fentaNYL (DURAGESIC - DOSED MCG/HR) 100 MCG/HR Place 100 mcg onto the skin every 3 (three) days.   Yes Historical Provider, MD  ALPRAZolam Duanne Moron) 0.5 MG tablet Take 0.5 mg by mouth 4 (four) times daily as needed for anxiety.     Historical Provider, MD  AMBULATORY NON FORMULARY MEDICATION [place].  Dx:  Rheumatoid Arthritis.  Use daily as needed for ambulation. 06/17/15   Sean Hommel, DO  amLODipine (NORVASC) 10 MG tablet Take 1 tablet (10 mg total) by mouth daily. 08/27/15   Marcial Pacas, DO  Calcium Carbonate-Vitamin D (CALCIUM 600+D) 600-400 MG-UNIT per tablet Take 1 tablet by mouth 2 (two) times daily.    Historical Provider, MD  diclofenac sodium (VOLTAREN) 1 % GEL Apply 1 application topically 4 (four) times daily. 11/06/15   Historical Provider, MD  diphenhydrAMINE (BENADRYL) 25 MG tablet Take 25 mg by mouth every 6 (six) hours as needed. For allergies    Historical Provider, MD  EPINEPHrine (ADRENACLICK) 0.3 [id number] mL IJ SOAJ injection Inject 0.3 mLs (0.3 mg total) into the muscle once. 12/30/15   Sean Hommel, DO  escitalopram (LEXAPRO) 20 MG tablet Take 20 mg by mouth daily.    Historical Provider, MD  hydrochlorothiazide (HYDRODIURIL) 25 MG tablet One tablet by mouth every morning for blood pressure control. 02/17/16   Sean Hommel, DO  ipratropium (ATROVENT) 0.06 % nasal spray Place 2 sprays into both nostrils 3 (three) times daily as needed for rhinitis. 11/25/14   Marcial Pacas, DO  montelukast (SINGULAIR) 10 MG tablet take 1 tablet by mouth at bedtime 05/02/16   Emeterio Reeve, DO  omeprazole (PRILOSEC) 20 MG capsule take 1 capsule by mouth once daily 03/02/16   Emeterio Reeve, DO  ondansetron (ZOFRAN) 4 MG tablet Take 1-2 tablets (4-8 mg total) by mouth every 8 (eight) hours as needed for nausea or vomiting. 05/26/15   Marcial Pacas, DO  oxyCODONE (OXY IR/ROXICODONE) 5 MG immediate release tablet take 1 tablet by mouth three times a day as needed for pain 04/06/16   Historical Provider, MD  potassium chloride SA (K-DUR,KLOR-CON) 20 MEQ tablet take 1 tablet by mouth once daily 02/19/16   Sueanne Margarita, MD  simvastatin (ZOCOR) 10 MG tablet Take 1 tablet (10 mg total) by mouth daily. 12/30/15   Marcial Pacas, DO  zoledronic acid (RECLAST) 5 MG/100ML SOLN Inject 5 mg into the vein. Once yearly. In June     Historical Provider, MD    Family History Family History  Problem Relation Age of Onset  . Bladder Cancer Father   . Diabetes Mother   . Hypertension Mother   . Breast cancer    . Colon polyps    . Depression Son     Social History Social History  Substance Use Topics  . Smoking status: Never Smoker  . Smokeless tobacco: Never Used  . Alcohol use No     Allergies   Bee venom; Ceftin [cefuroxime axetil]; Ciprofloxacin; Ephedrine; Sulfonamide derivatives; Telithromycin; Valsartan; Verapamil; Venlafaxine; Beta adrenergic blockers; Cephalosporins; Clindamycin/lincomycin; Cymbalta [duloxetine hcl]; Doxazosin; Gabapentin; Hydralazine; Lamotrigine; Mirtazapine; Morphine; Oxycodone-acetaminophen; Pregabalin; Prochlorperazine edisylate; Pseudoephedrine; Relafen [  nabumetone]; Zoloft [sertraline]; and Doxycycline   Review of Systems Review of Systems  Constitutional: Positive for activity change and fatigue. Negative for appetite change, chills, diaphoresis, fever and unexpected weight change.  HENT: Negative.   Eyes: Negative.   Respiratory: Negative.   Cardiovascular: Negative.   Gastrointestinal: Positive for nausea. Negative for vomiting.  Genitourinary: Negative.   Musculoskeletal: Negative.   Skin: Negative.   Neurological: Positive for dizziness and headaches.  Psychiatric/Behavioral: Positive for dysphoric mood. Negative for suicidal ideas. The patient is nervous/anxious.      Physical Exam Triage Vital Signs ED Triage Vitals  Enc Vitals Group     BP 05/29/16 1506 128/74     Pulse Rate 05/29/16 1506 90     Resp 05/29/16 1506 16     Temp 05/29/16 1506 98.2 F (36.8 C)     Temp Source 05/29/16 1506 Oral     SpO2 05/29/16 1506 94 %     Weight 05/29/16 1506 161 lb (73 kg)     Height 05/29/16 1506 5' (1.524 m)     Head Circumference --      Peak Flow --      Pain Score 05/29/16 1516 0     Pain Loc --      Pain Edu? --      Excl. in [place]? --    No data  found.   Updated Vital Signs BP 128/74 (BP Location: Left Arm)   Pulse 90   Temp 98.2 F (36.8 C) (Oral)   Resp 16   Ht 5' (1.524 m)   Wt 161 lb (73 kg)   SpO2 94%   BMI 31.44 kg/m   Visual Acuity Right Eye Distance:   Left Eye Distance:   Bilateral Distance:    Right Eye Near:   Left Eye Near:    Bilateral Near:     Physical Exam  Constitutional: She is oriented to person, place, and time. She appears well-developed and well-nourished. No distress.  HENT:  Head: Normocephalic.  Right Ear: Tympanic membrane, external ear and ear canal normal.  Left Ear: Tympanic membrane, external ear and ear canal normal.  Nose: Nose normal.  Mouth/Throat: Oropharynx is clear and moist.  Eyes: Conjunctivae and EOM are normal. Pupils are equal, round, and reactive to light.  Neck: Neck supple.  Cardiovascular: Normal heart sounds.   Pulmonary/Chest: Breath sounds normal.  Abdominal: There is no tenderness.  Musculoskeletal: She exhibits edema.  Lymphadenopathy:    She has no cervical adenopathy.  Neurological: She is alert and oriented to person, place, and time. She has normal reflexes. No cranial nerve deficit or sensory deficit.  Skin: Skin is warm and dry.  Psychiatric: Her behavior is normal. Judgment and thought content normal.  Mildly depressed mood/affect flat.  Nursing note and vitals reviewed.    UC Treatments / Results  Labs (all labs ordered are listed, but only abnormal results are displayed) Labs Reviewed - No data to display  EKG  EKG Interpretation None       Radiology No results found.  Procedures Procedures (including critical care time)  Medications Ordered in UC Medications - No data to display   Initial Impression / Assessment and Plan / UC Course  I have reviewed the triage vital signs and the nursing notes.  Pertinent labs & imaging results that were available during my care of the patient were reviewed by me and considered in my medical  decision making (see chart for details).  Clinical Course  Suspect recent increase in symptoms a result of medication changes (Lexapro) Chart review indicates normal TSH on 03/09/15 (2.639); Hgb A1C 6.1 (02/10/16); normal CBC 11/05/15); CMP showed mild elevation AFT and ALT (stable compared to previous measurements). Recommend no changes today; follow-up with PCP for management.     Final Clinical Impressions(s) / UC Diagnoses   Final diagnoses:  Chronic fatigue and malaise    New Prescriptions Discharge Medication List as of 05/29/2016  4:02 PM       Kandra Nicolas, MD 06/07/16 1145

## 2016-06-07 ENCOUNTER — Telehealth: Payer: Self-pay | Admitting: *Deleted

## 2016-06-07 ENCOUNTER — Ambulatory Visit (INDEPENDENT_AMBULATORY_CARE_PROVIDER_SITE_OTHER): Payer: Medicare Other | Admitting: Physician Assistant

## 2016-06-07 ENCOUNTER — Encounter: Payer: Self-pay | Admitting: Physician Assistant

## 2016-06-07 VITALS — BP 145/64 | HR 87 | Ht 60.0 in | Wt 161.0 lb

## 2016-06-07 DIAGNOSIS — F32A Depression, unspecified: Secondary | ICD-10-CM

## 2016-06-07 DIAGNOSIS — F418 Other specified anxiety disorders: Secondary | ICD-10-CM | POA: Diagnosis not present

## 2016-06-07 DIAGNOSIS — I10 Essential (primary) hypertension: Secondary | ICD-10-CM | POA: Diagnosis not present

## 2016-06-07 DIAGNOSIS — F329 Major depressive disorder, single episode, unspecified: Secondary | ICD-10-CM

## 2016-06-07 DIAGNOSIS — N183 Chronic kidney disease, stage 3 unspecified: Secondary | ICD-10-CM

## 2016-06-07 DIAGNOSIS — K219 Gastro-esophageal reflux disease without esophagitis: Secondary | ICD-10-CM

## 2016-06-07 DIAGNOSIS — F419 Anxiety disorder, unspecified: Secondary | ICD-10-CM

## 2016-06-07 DIAGNOSIS — F331 Major depressive disorder, recurrent, moderate: Secondary | ICD-10-CM

## 2016-06-07 NOTE — Telephone Encounter (Signed)
Called to follow up with pt from her visit on 05/29/16. LM to call back if she has any questions or concerns.

## 2016-06-07 NOTE — Progress Notes (Signed)
Subjective:    Patient ID: [name], female    DOB: 1932-10-09, 82 y.o.   MRN: [id number]  HPI  Pt is a 82 yo female who presents to the clinic to establish care. She was previously seen by Dr. Ileene Rubens.   .. Active Ambulatory Problems    Diagnosis Date Noted  . GERD 09/24/2010  . COUGH, CHRONIC 09/24/2010  . DIARRHEA 09/24/2010  . IBD 09/27/2010  . HTN (hypertension) 03/31/2011  . Dyslipidemia 03/31/2011  . Depression 03/31/2011  . Fibromyalgia 03/31/2011  . DJD (degenerative joint disease) 03/31/2011  . Humeral fracture 03/31/2011  . Post-operative infection 03/31/2011  . Cystitis, acute hemorrhagic 04/14/2011  . GERD (gastroesophageal reflux disease)   . Edema extremities   . Bradycardia 06/03/2013  . Chronic rheumatic arthritis ([place]) 02/19/2014  . Anxiety and depression 02/19/2014  . Osteoporosis 03/12/2014  . Personal history of colonic polyps 03/12/2014  . Left shoulder pain 04/30/2014  . Constipation 05/23/2014  . Right ankle pain 10/09/2014  . Primary osteoarthritis of left knee 01/01/2015  . Seasonal allergies 02/24/2015  . Elevated LFTs 03/10/2015  . Weakness 04/20/2015  . Intertrigo 08/14/2015  . Prediabetes 02/24/2016  . Chronic kidney disease (CKD), stage III (moderate) 02/24/2016   Resolved Ambulatory Problems    Diagnosis Date Noted  . No Resolved Ambulatory Problems   Past Medical History:  Diagnosis Date  . Anxiety   . Arthritis   . Asthma   . C. difficile diarrhea   . Candida esophagitis ([place])   . Chronic sinusitis   . Depression   . Depression   . Diverticulosis   . DJD (degenerative joint disease)   . Edema extremities   . Fibromyalgia   . GERD (gastroesophageal reflux disease)   . Gout   . Hyperlipidemia   . Hypertension   . Osteoarthritis   . Osteopenia   . Panic disorder   . Renal failure, unspecified   . Spinal stenosis of lumbar region   . Tubular adenoma of colon    .[name] Family History  Problem Relation Age of  Onset  . Bladder Cancer Father   . Diabetes Mother   . Hypertension Mother   . Breast cancer    . Colon polyps    . Depression Son    .[name] Social History   Social History  . Marital status: Married    Spouse name: N/A  . Number of children: N/A  . Years of education: N/A   Occupational History  . Not on file.   Social History Main Topics  . Smoking status: Never Smoker  . Smokeless tobacco: Never Used  . Alcohol use No  . Drug use: No  . Sexual activity: No   Other Topics Concern  . Not on file   Social History Narrative  . No narrative on file   She sees a pyschiatrist, NP Chalmers Cater, and a cardiologist Dr. Radford Pax.    She has no compliants or concerns today. She does not need any refills.     Review of Systems  All other systems reviewed and are negative.      Objective:   Physical Exam  Constitutional: She is oriented to person, place, and time. She appears well-developed and well-nourished.  HENT:  Head: Normocephalic and atraumatic.  Cardiovascular: Normal rate, regular rhythm and normal heart sounds.   Pulmonary/Chest: Effort normal and breath sounds normal.  Neurological: She is alert and oriented to person, place, and time.  Psychiatric: She has a  normal mood and affect. Her behavior is normal.          Assessment & Plan:  Marland KitchenMarland KitchenDiagnoses and all orders for this visit:  Essential hypertension  Gastroesophageal reflux disease, esophagitis presence not specified  Anxiety and depression  Chronic kidney disease (CKD), stage III (moderate)  Moderate episode of recurrent major depressive disorder ([place])   Reviewed Problem list today. No labs or medication refills needed. Follow up in 3 months.

## 2016-06-08 ENCOUNTER — Encounter: Payer: Self-pay | Admitting: Physician Assistant

## 2016-06-08 DIAGNOSIS — J45909 Unspecified asthma, uncomplicated: Secondary | ICD-10-CM | POA: Diagnosis not present

## 2016-06-08 DIAGNOSIS — M545 Low back pain: Secondary | ICD-10-CM | POA: Diagnosis not present

## 2016-06-08 DIAGNOSIS — G8929 Other chronic pain: Secondary | ICD-10-CM | POA: Diagnosis not present

## 2016-06-08 DIAGNOSIS — M1712 Unilateral primary osteoarthritis, left knee: Secondary | ICD-10-CM | POA: Diagnosis not present

## 2016-06-08 DIAGNOSIS — M069 Rheumatoid arthritis, unspecified: Secondary | ICD-10-CM | POA: Diagnosis not present

## 2016-06-08 DIAGNOSIS — I10 Essential (primary) hypertension: Secondary | ICD-10-CM | POA: Diagnosis not present

## 2016-06-17 ENCOUNTER — Encounter: Payer: Self-pay | Admitting: Sports Medicine

## 2016-06-17 ENCOUNTER — Ambulatory Visit (INDEPENDENT_AMBULATORY_CARE_PROVIDER_SITE_OTHER): Payer: Medicare Other | Admitting: Physician Assistant

## 2016-06-17 ENCOUNTER — Ambulatory Visit (INDEPENDENT_AMBULATORY_CARE_PROVIDER_SITE_OTHER): Payer: Medicare Other | Admitting: Sports Medicine

## 2016-06-17 ENCOUNTER — Encounter: Payer: Self-pay | Admitting: Physician Assistant

## 2016-06-17 VITALS — BP 137/62 | HR 87

## 2016-06-17 DIAGNOSIS — R3915 Urgency of urination: Secondary | ICD-10-CM | POA: Diagnosis not present

## 2016-06-17 DIAGNOSIS — Z96641 Presence of right artificial hip joint: Secondary | ICD-10-CM | POA: Insufficient documentation

## 2016-06-17 DIAGNOSIS — M7061 Trochanteric bursitis, right hip: Secondary | ICD-10-CM | POA: Diagnosis not present

## 2016-06-17 DIAGNOSIS — R300 Dysuria: Secondary | ICD-10-CM | POA: Diagnosis not present

## 2016-06-17 DIAGNOSIS — N3001 Acute cystitis with hematuria: Secondary | ICD-10-CM | POA: Diagnosis not present

## 2016-06-17 LAB — POCT URINALYSIS DIPSTICK
BILIRUBIN UA: NEGATIVE
Glucose, UA: NEGATIVE
KETONES UA: NEGATIVE
NITRITE UA: NEGATIVE
PH UA: 6.5
PROTEIN UA: NEGATIVE
Spec Grav, UA: 1.015
Urobilinogen, UA: 1

## 2016-06-17 MED ORDER — NITROFURANTOIN MONOHYD MACRO 100 MG PO CAPS: 100.0000 mg | ORAL_CAPSULE | Freq: Two times a day (BID) | ORAL | 0 refills | Status: DC

## 2016-06-17 NOTE — Patient Instructions (Signed)

## 2016-06-17 NOTE — Progress Notes (Signed)
   Subjective:    I'm seeing this patient as a consultation for:  Iran Planas, PA-C  CC: Right hip pain  HPI: This is a pleasant 82 year old female, she was seen by Dr. Georgina Snell and diagnosed with trochanteric bursitis, she is post right hip arthroplasty. She had normal physical therapy, unfortunately continues to have pain and is referred to me by [facility] for further evaluation and definitive treatment, pain is severe, persistent, localized over the greater trochanter without radiation.  Past medical history:  Negative.  See flowsheet/record as well for more information.  Surgical history: Negative.  See flowsheet/record as well for more information.  Family history: Negative.  See flowsheet/record as well for more information.  Social history: Negative.  See flowsheet/record as well for more information.  Allergies, and medications have been entered into the medical record, reviewed, and no changes needed.   Review of Systems: No headache, visual changes, nausea, vomiting, diarrhea, constipation, dizziness, abdominal pain, skin rash, fevers, chills, night sweats, weight loss, swollen lymph nodes, body aches, joint swelling, muscle aches, chest pain, shortness of breath, mood changes, visual or auditory hallucinations.   Objective:   General: Well Developed, well nourished, and in no acute distress.  Neuro/Psych: Alert and oriented x3, extra-ocular muscles intact, able to move all 4 extremities, sensation grossly intact. Skin: Warm and dry, no rashes noted.  Respiratory: Not using accessory muscles, speaking in full sentences, trachea midline.  Cardiovascular: Pulses palpable, no extremity edema. Abdomen: Does not appear distended. Right Hip: ROM IR: 60 Deg, ER: 60 Deg, Flexion: 120 Deg, Extension: 100 Deg, Abduction: 45 Deg, Adduction: 45 Deg Strength IR: 5/5, ER: 5/5, Flexion: 5/5, Extension: 5/5, Abduction: 5/5, Adduction: 5/5 Pelvic alignment unremarkable to inspection and  palpation. Standing hip rotation and gait without trendelenburg / unsteadiness. Greater trochanter with tenderness to palpation. No tenderness over piriformis. No SI joint tenderness and normal minimal SI movement.  Procedure: Real-time Ultrasound Guided Injection of right greater trochanteric bursa Device: GE Logiq E  Verbal informed consent obtained.  Time-out conducted.  Noted no overlying erythema, induration, or other signs of local infection.  Skin prepped in a sterile fashion.  Local anesthesia: Topical Ethyl chloride.  With sterile technique and under real time ultrasound guidance:  Spinal needle advanced to the bursa and 1 mL Kenalog 40, 2 mL lidocaine, 2 mL Marcaine injected easily. Completed without difficulty  Pain immediately resolved suggesting accurate placement of the medication.  Advised to call if fevers/chills, erythema, induration, drainage, or persistent bleeding.  Images permanently stored and available for review in the ultrasound unit.  Impression: Technically successful ultrasound guided injection.  Impression and Recommendations:   This case required medical decision making of moderate complexity.  Trochanteric bursitis, right hip Injection as above, failed home health physical therapy.

## 2016-06-17 NOTE — Assessment & Plan Note (Signed)
Injection as above, failed home health physical therapy.

## 2016-06-17 NOTE — Addendum Note (Signed)
Addended by: Beatris Ship L on: 06/17/2016 03:02 PM   Modules accepted: Orders

## 2016-06-17 NOTE — Progress Notes (Signed)
   Subjective:    Patient ID: [name], female    DOB: November 26, 1932, 82 y.o.   MRN: [id number]  HPI Pt is a 82 yo female who presents to the clinic with dysuria that started yesterday. She has not taken anything to make better. Her frequency has increased as well. Denies any fever, chills, flank pain, abdominal pain, or memory changes.    Review of Systems  All other systems reviewed and are negative.      Objective:   Physical Exam  Constitutional: She is oriented to person, place, and time. She appears well-developed and well-nourished.  HENT:  Head: Normocephalic and atraumatic.  Cardiovascular: Normal rate, regular rhythm and normal heart sounds.   Pulmonary/Chest: Effort normal and breath sounds normal.  No CVA tenderness.   Abdominal: Soft. Bowel sounds are normal. She exhibits no distension and no mass. There is no tenderness. There is no rebound and no guarding.  Neurological: She is alert and oriented to person, place, and time.  Psychiatric: She has a normal mood and affect. Her behavior is normal.          Assessment & Plan:  Marland KitchenMarland KitchenDiagnoses and all orders for this visit:  Acute cystitis with hematuria -     nitrofurantoin, macrocrystal-monohydrate, (MACROBID) 100 MG capsule; Take 1 capsule (100 mg total) by mouth 2 (two) times daily. For 7 days.  Dysuria -     POCT urinalysis dipstick -     nitrofurantoin, macrocrystal-monohydrate, (MACROBID) 100 MG capsule; Take 1 capsule (100 mg total) by mouth 2 (two) times daily. For 7 days.  Urinary urgency -     POCT urinalysis dipstick -     nitrofurantoin, macrocrystal-monohydrate, (MACROBID) 100 MG capsule; Take 1 capsule (100 mg total) by mouth 2 (two) times daily. For 7 days.   .. Results for orders placed or performed in visit on 06/17/16  POCT urinalysis dipstick  Result Value Ref Range   Color, UA yellow    Clarity, UA clear    Glucose, UA neg    Bilirubin, UA neg    Ketones, UA neg    Spec Grav, UA  1.015    Blood, UA moderate    pH, UA 6.5    Protein, UA neg    Urobilinogen, UA 1.0    Nitrite, UA neg    Leukocytes, UA large (3+) (A) Negative   Will culture.  Sent macrobid for 7 days due to extensive allergies to other abx.  Symptomatic care discussed. Follow up as needed.

## 2016-06-20 LAB — URINE CULTURE

## 2016-06-20 NOTE — Progress Notes (Signed)
Marcobid should treat infection.

## 2016-06-29 ENCOUNTER — Telehealth: Payer: Self-pay

## 2016-06-29 ENCOUNTER — Other Ambulatory Visit: Payer: Self-pay

## 2016-06-29 MED ORDER — NITROFURANTOIN MONOHYD MACRO 100 MG PO CAPS: 100.0000 mg | ORAL_CAPSULE | Freq: Two times a day (BID) | ORAL | 0 refills | Status: DC

## 2016-06-29 NOTE — Telephone Encounter (Signed)
If patient is not having any nausea/vomiting/abdominal pain/fever/flank pain then [facility] to send macrobid 100mg  bid for 10 days. This is about the only thing she is not allergic to. If symptoms have not cleared needs repeat UA and culture. If continues to have frequent UTI's then need to consider preventative therapy.

## 2016-06-29 NOTE — Telephone Encounter (Signed)
She is not having any symptoms.  Script sent.

## 2016-07-08 ENCOUNTER — Other Ambulatory Visit: Payer: Self-pay | Admitting: Family Medicine

## 2016-07-08 DIAGNOSIS — M069 Rheumatoid arthritis, unspecified: Secondary | ICD-10-CM

## 2016-07-08 DIAGNOSIS — N183 Chronic kidney disease, stage 3 unspecified: Secondary | ICD-10-CM

## 2016-07-12 ENCOUNTER — Other Ambulatory Visit: Payer: Self-pay | Admitting: *Deleted

## 2016-07-12 ENCOUNTER — Encounter: Payer: Self-pay | Admitting: *Deleted

## 2016-07-12 DIAGNOSIS — I10 Essential (primary) hypertension: Secondary | ICD-10-CM

## 2016-07-12 NOTE — Patient Outreach (Addendum)
[name]) Care Management  07/12/2016  [name] 1932/12/20 [id number]  RN spoke with pt today and introduced the [facility] services and available services. Pt receptive to the call and inquired further. RN inquired about pt's medical history and possible needs in managing her ongoing health. Pt states she has HTN but it's "well controlled". Pt reports she and her spouse are in need of some household cleaning. RN discussed possible community resources however based upon her location maybe limited with the available of such resources but RN will make a referral for a social worker consult via [facility]. RN also offered to extend a home visit for a more one-on-one management of care with this RN case manager and provide educational measures around her medical condition (pt opt to decline). 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 RN offered telephonic disease management as pt receptive to monthly contacts with a telephone nurse to assist pt with managing her ongoing needs. RN will make a referral based upon pt's request and notify pt's provider concerning pt's deposition with [facility] services  Raina Mina, RN Care Management Coordinator [place] [facility] [phone]

## 2016-07-12 NOTE — Addendum Note (Signed)
Addended by: Raina Mina D on: 07/12/2016 06:11 PM   Modules accepted: Orders

## 2016-07-13 ENCOUNTER — Other Ambulatory Visit: Payer: Self-pay

## 2016-07-13 ENCOUNTER — Telehealth: Payer: Self-pay | Admitting: Physician Assistant

## 2016-07-13 MED ORDER — ONDANSETRON HCL 4 MG PO TABS: 4.0000 mg | ORAL_TABLET | Freq: Three times a day (TID) | ORAL | 1 refills | Status: DC | PRN

## 2016-07-13 MED ORDER — SIMVASTATIN 10 MG PO TABS: 10.0000 mg | ORAL_TABLET | Freq: Every day | ORAL | 1 refills | Status: DC

## 2016-07-13 NOTE — Progress Notes (Signed)
This encounter was created in error - please disregard.

## 2016-07-13 NOTE — Telephone Encounter (Signed)
Ok to switch 

## 2016-07-13 NOTE — Addendum Note (Signed)
Addended by: Jiles Harold on: 07/13/2016 10:31 AM   Modules accepted: Miquel Dunn

## 2016-07-13 NOTE — Telephone Encounter (Signed)
Patient called very upset and was a pt of Hommel's and now sees Luvenia Starch but is very unhappy with a PA due to not getting return calls and meds in a timely manner and she wants to know if she can switch her care to [facility] or does she need to find another physician office?  She states she has seen Dr.Corey before and really likes Dr.Corey. I informed patient I would send a phone note and ask Dr.Corey if he will take her as a primary Care Patient?

## 2016-07-15 ENCOUNTER — Encounter: Payer: Self-pay | Admitting: *Deleted

## 2016-07-15 ENCOUNTER — Encounter: Payer: Self-pay | Admitting: Sports Medicine

## 2016-07-15 ENCOUNTER — Ambulatory Visit (INDEPENDENT_AMBULATORY_CARE_PROVIDER_SITE_OTHER): Payer: Medicare Other | Admitting: Sports Medicine

## 2016-07-15 DIAGNOSIS — M069 Rheumatoid arthritis, unspecified: Secondary | ICD-10-CM | POA: Diagnosis not present

## 2016-07-15 DIAGNOSIS — G4700 Insomnia, unspecified: Secondary | ICD-10-CM | POA: Diagnosis not present

## 2016-07-15 DIAGNOSIS — G894 Chronic pain syndrome: Secondary | ICD-10-CM | POA: Diagnosis not present

## 2016-07-15 DIAGNOSIS — M7062 Trochanteric bursitis, left hip: Secondary | ICD-10-CM | POA: Insufficient documentation

## 2016-07-15 DIAGNOSIS — M47816 Spondylosis without myelopathy or radiculopathy, lumbar region: Secondary | ICD-10-CM | POA: Diagnosis not present

## 2016-07-15 NOTE — Assessment & Plan Note (Signed)
Injection as above, patient declines physical therapy.  Return to see me in one month.

## 2016-07-15 NOTE — Progress Notes (Signed)
   Subjective:    I'm seeing this patient as a consultation for:  Dr. Lynne Leader  CC: Left hip pain  HPI: For weeks this pleasant 82 year old female has had pain that she localizes on the lateral aspect of her left hip, worse with laying on the ipsilateral side. I injected her right greater trochanteric bursa month ago, and she had a good response for a few days, having some recurrence of pain. She desires interventional treatment today on the left hip. It is localized, moderate, persistent without radiation.  Past medical history:  Negative.  See flowsheet/record as well for more information.  Surgical history: Negative.  See flowsheet/record as well for more information.  Family history: Negative.  See flowsheet/record as well for more information.  Social history: Negative.  See flowsheet/record as well for more information.  Allergies, and medications have been entered into the medical record, reviewed, and no changes needed.   Review of Systems: No headache, visual changes, nausea, vomiting, diarrhea, constipation, dizziness, abdominal pain, skin rash, fevers, chills, night sweats, weight loss, swollen lymph nodes, body aches, joint swelling, muscle aches, chest pain, shortness of breath, mood changes, visual or auditory hallucinations.   Objective:   General: Well Developed, well nourished, and in no acute distress.  Neuro/Psych: Alert and oriented x3, extra-ocular muscles intact, able to move all 4 extremities, sensation grossly intact. Skin: Warm and dry, no rashes noted.  Respiratory: Not using accessory muscles, speaking in full sentences, trachea midline.  Cardiovascular: Pulses palpable, no extremity edema. Abdomen: Does not appear distended. Left Hip: ROM IR: 20 Deg, ER: 60 Deg, Flexion: 120 Deg, Extension: 100 Deg, Abduction: 45 Deg, Adduction: 45 Deg Strength IR: 5/5, ER: 5/5, Flexion: 5/5, Extension: 5/5, Abduction: 5/5, Adduction: 5/5 Pelvic alignment unremarkable to  inspection and palpation. Standing hip rotation and gait without trendelenburg / unsteadiness. Greater trochanter with tenderness to palpation. No tenderness over piriformis. No SI joint tenderness and normal minimal SI movement.  Procedure: Real-time Ultrasound Guided Injection of left greater trochanteric bursa Device: GE Logiq E  Verbal informed consent obtained.  Time-out conducted.  Noted no overlying erythema, induration, or other signs of local infection.  Skin prepped in a sterile fashion.  Local anesthesia: Topical Ethyl chloride.  With sterile technique and under real time ultrasound guidance:  Using a 22-gauge spinal needle advanced through the hip abductor tendons contacted the greater trochanter, I then injected 1 mL kenalog 40, 2 mL lidocaine, 2 mL Marcaine easily avoiding intratendinous injection, I also placed some medications superficial to the insertion of the hip abductor tendons Completed without difficulty  Pain immediately resolved suggesting accurate placement of the medication.  Advised to call if fevers/chills, erythema, induration, drainage, or persistent bleeding.  Images permanently stored and available for review in the ultrasound unit.  Impression: Technically successful ultrasound guided injection.  Impression and Recommendations:   This case required medical decision making of moderate complexity.  Trochanteric bursitis, left hip Injection as above, patient declines physical therapy.  Return to see me in one month.

## 2016-07-19 ENCOUNTER — Other Ambulatory Visit: Payer: Self-pay | Admitting: *Deleted

## 2016-07-20 ENCOUNTER — Encounter: Payer: Self-pay | Admitting: *Deleted

## 2016-07-20 NOTE — Patient Outreach (Signed)
[facility]) Care Management  07/20/2016  [name] May 05, 1933 [id number]   CSW was able to make initial contact with patient's husband, Darlette Dubow today to perform the phone assessment on patient, as well as assess and assist with social work needs and services.  CSW introduced self, explained role and types of services provided through [facility] ([facility]).  CSW further explained to Mr. Reising that CSW works with patient's Telephonic RNCM, also with [name], Crystal Hutchinson. CSW then explained the reason for the call, indicating that Mrs. Hutchinson thought that patient would benefit from social work services and resources to assist with resources and referrals for cleaning services in the [place] area.  CSW obtained two HIPAA compliant identifiers from Mr. Frankson, which included patient's name and date of birth. According to Mr. Battey, he and patient are in need of someone to come into the home on a regular basis to "clean up the kitchen, do laundry, mop the floors, etc."  Mr. Dyck admits that he and patient are no longer able to perform these activities of daily living, and that their two daughter's are only able to take turns coming out once a week to perform these activities for them.  CSW was able to provide patient and Mr. Koke with the following list of resources, explaining that insurance does not cover these types of services, that all services will be out-of-pocket:  [facility] [place], [place] [phone] Open until 9:00 PM  [facility], [place], [place] [phone] Open until 6:00 PM  [address] of the [place], [place] [phone] Open until 5:00 PM  [facility] [place], [place] [phone] Opens at 9:00 AM  [facility] of the [employer], [place] [phone] Open until 5:00  PM  [facility] [place] [place], [place] [phone] Opens at 9:00 AM  [facility] [place], [place] [phone] Open until [place], [place] [phone]  [address] [place], [place] [phone] Open until 6:00 PM  [address] [place], [place] [phone]  [place], [place] [phone] Open until 5:00 PM  [facility] [place], [place] [phone] Open until 5:00 PM  [place], [place] [phone]  Mr. Hessler voiced understanding and was agreeable to having his daughters contact these agencies to obtain prices and services provided.  CSW provided Mr. Sunga with CSW's contact information, encouraging him or patient to contact CSW anytime in the near future, if additional social work needs are identified.   CSW will perform a case closure on patient, as all goals of treatment have been met from social work standpoint and no additional social work needs have been identified at this time.  CSW will notify patient's Telephonic RNCM with [facility], Odis Hollingshead of CSW's plans to close patient's case.  CSW will fax an update to patient's Primary Care Physician, Dr. Lynne Leader to ensure that they are aware of CSW's involvement with patient's plan of care.  CSW will submit a case closure request to Josepha Pigg, Care Management Assistant with [facility], in the form of an [employer].  CSW will ensure that Mrs. Quentin Cornwall is aware of Mrs. Hutchinson's, RNCM with [facility], continued involvement with patient's care. Nat Christen, [place], [name], [name]  Licensed Clinical  Social Warden/ranger System  Mailing Address-[address], [place], [place] [address] Physical Address-[address], [place], [place] [address] Toll Free Main # [phone] Fax # [phone] Cell # [phone]  Fax # [phone]  [email or web address]

## 2016-07-21 ENCOUNTER — Other Ambulatory Visit: Payer: Self-pay

## 2016-07-21 NOTE — Patient Outreach (Signed)
[facility]) Care Management  07/21/2016  [name] 1933-03-20 [id number]  Telephone call to patient regarding primary MD referral. Unable to reach patient. HIPAA compliant voice message left with call back phone number.  PLAN;RNCM will attempt 2nd telephone outreach to patient within 1 week.   Quinn Plowman RN,BSN,CCM [facility] Telephonic  [phone]

## 2016-07-27 ENCOUNTER — Other Ambulatory Visit: Payer: Self-pay

## 2016-07-27 DIAGNOSIS — F341 Dysthymic disorder: Secondary | ICD-10-CM | POA: Diagnosis not present

## 2016-07-27 DIAGNOSIS — F410 Panic disorder [episodic paroxysmal anxiety] without agoraphobia: Secondary | ICD-10-CM | POA: Diagnosis not present

## 2016-07-27 DIAGNOSIS — F331 Major depressive disorder, recurrent, moderate: Secondary | ICD-10-CM | POA: Diagnosis not present

## 2016-07-27 NOTE — Patient Outreach (Signed)
[facility]) Care Management  07/27/2016  [name] 29-Jul-1933 [id number]  REFERRAL DATE: 07/12/16 REFERRAL SOURCE; Primary MD  REFERRAL REASON; History of hypertension and orders requested additional services from providers office.  CONSENT:  HIPAA verified with patient.   Telephone call to patient regarding primary MD referral. Discussed and offered [facility] care management services to patient.  Patient refused nursing services. Patient states she managing her blood pressure and other health conditions at this time. Patient state she had an issue regarding the ordering of her cholesterol medication but this has been resolved. Patient reports she received the community resource information  Regarding cleaning services from the [facility] care management social worker. Patient states she is not sure if this is going to be beneficial for her. States she is looking for someone through her friends or church that is able to come to her home and do light housekeeping.  Patient states she does not have any other needs. Patient states she has a follow up appointment with her primary MD on 09/07/16.  Patient verbally agreed to receiving the [facility] care management outreach letter and brochure for future needs.   PLAN: RNCM will notify care management assistant to close patient due to refusal of services.  RNCM will notify patients primary MD of refusal of services. RNCM will send patient [facility] care management outreach letter/ brochure.   Quinn Plowman RN,BSN,CCM [facility] Telephonic  [phone]

## 2016-07-27 NOTE — Telephone Encounter (Signed)
This encounter was created in error - please disregard.

## 2016-07-28 ENCOUNTER — Encounter: Payer: Self-pay | Admitting: Sports Medicine

## 2016-07-28 ENCOUNTER — Ambulatory Visit (INDEPENDENT_AMBULATORY_CARE_PROVIDER_SITE_OTHER): Payer: Medicare Other | Admitting: Sports Medicine

## 2016-07-28 DIAGNOSIS — M1612 Unilateral primary osteoarthritis, left hip: Secondary | ICD-10-CM | POA: Diagnosis not present

## 2016-07-28 NOTE — Assessment & Plan Note (Signed)
Trochanteric bursa pain is resolved after injection at the last visit, now having some pain referable to the joint, hip joint injection as above, return in one month.

## 2016-07-28 NOTE — Progress Notes (Signed)
  Subjective:    CC: Left hip pain  HPI: This is a pleasant 82 year old female post right hip arthroplasty, we injected her left trochanteric bursa at the last visit and she returns really not having had much pain relief. Further questioning the pain over her lateral hip has improved however she continues to have pain both anteriorly and posteriorly. Severe, persistent without radiation, no mechanical symptoms, no recent trauma.  Past medical history:  Negative.  See flowsheet/record as well for more information.  Surgical history: Negative.  See flowsheet/record as well for more information.  Family history: Negative.  See flowsheet/record as well for more information.  Social history: Negative.  See flowsheet/record as well for more information.  Allergies, and medications have been entered into the medical record, reviewed, and no changes needed.   Review of Systems: No fevers, chills, night sweats, weight loss, chest pain, or shortness of breath.   Objective:    General: Well Developed, well nourished, and in no acute distress.  Neuro: Alert and oriented x3, extra-ocular muscles intact, sensation grossly intact.  HEENT: Normocephalic, atraumatic, pupils equal round reactive to light, neck supple, no masses, no lymphadenopathy, thyroid nonpalpable.  Skin: Warm and dry, no rashes. Cardiac: Regular rate and rhythm, no murmurs rubs or gallops, no lower extremity edema.  Respiratory: Clear to auscultation bilaterally. Not using accessory muscles, speaking in full sentences. Left Hip: ROM Internal rotation to 10 with significant pain, external rotation to 30, Flexion: 120 Deg, Extension: 100 Deg, Abduction: 45 Deg, Adduction: 45 Deg Strength IR: 5/5, ER: 5/5, Flexion: 5/5, Extension: 5/5, Abduction: 5/5, Adduction: 5/5 Pelvic alignment unremarkable to inspection and palpation. Standing hip rotation and gait without trendelenburg / unsteadiness. Greater trochanter without tenderness to  palpation. No tenderness over piriformis. No SI joint tenderness and normal minimal SI movement.  Procedure: Real-time Ultrasound Guided Injection of left hip joint Device: GE Logiq E  Verbal informed consent obtained.  Time-out conducted.  Noted no overlying erythema, induration, or other signs of local infection.  Skin prepped in a sterile fashion.  Local anesthesia: Topical Ethyl chloride.  With sterile technique and under real time ultrasound guidance:  22-gauge spinal needle advanced to the femoral head/neck junction, contacted bone and 1 mL kenalog 40, 2 mL lidocaine, 2 mL Marcaine injected easily. Completed without difficulty  Pain immediately resolved suggesting accurate placement of the medication.  Advised to call if fevers/chills, erythema, induration, drainage, or persistent bleeding.  Images permanently stored and available for review in the ultrasound unit.  Impression: Technically successful ultrasound guided injection.  Impression and Recommendations:    Primary osteoarthritis of left hip Trochanteric bursa pain is resolved after injection at the last visit, now having some pain referable to the joint, hip joint injection as above, return in one month.

## 2016-08-11 ENCOUNTER — Other Ambulatory Visit: Payer: Self-pay

## 2016-08-11 NOTE — Patient Outreach (Signed)
[facility]) Care Management  08/11/2016  [name] 02/17/1933 [id number]  REFERRAL DATE: 08/11/16  REFERRAL SOURCE: primary MD   REFERRAL REASON: Hypertension, care coordination CONSENT: HIPAA verified with patient  PROVIDERS:  Dr. Lynne Leader [facility]  SOCIAL: Patient lives with spouse. Patient states she and her husband both have health issues. Patient states she and her husband provide transportation for each other to doctor appointments  SUBJECTIVE: Returned patients call as requested. Patient states she has had time to review the [facility] care management brochure and she feels like the program would be beneficial to her. Patient states she would like to have follow up regarding her blood pressure. Patient states she feels this will be beneficial for her. Patient states she has rheumatoid arthritis and chronic back pain. Patient states she is on fentanyl and oxycodone.  Patient states she has been having more problems with her depression and anxiety. Patient states she has had depression and anxiety since her early 61's.  Patient states recently she has been more depressed, anxious , and frustrated. Patient states she has been on lexapro for a long time but does not feel like is is working now. Patient states she sees the nurse practitioner who prescribes her antidepressants once every 6 months.  Patient states her lexapro was increased to 30 mg previously but she was unable to tolerate it at that does due to the side effect. Patient states she is currently on 20mg . Patient states she also takes xanax.  Patient states she thinks she would do better just taking the xanax and not the lexapro.Patient states the doctor is not able to prescribe her certain medications because she has allergies to many medications.  Patient states she sees her counselor every 2-3 weeks. Patient states she has been seeing the same counselor for approximately 8-9 years.  Patient states she  thinks some of her depression is more due to having problems, loneliness, and stress.  Patient states she has two dogs that bring her comfort. Patient states she has children and grandchildren. Patient states she doesn't get to see them much. Patient states she would like to talk with the pharmacist regarding her medications. Patient denies any feelings of hurting herself. Patient states she would like to find assistance getting her pill box filled.  RNCM called patients pharmacy [facility].  Verified with pharmacist that they do not have a program for filling medication boxes.  RNCM contacted [employer]. Spoke with pharmacist and was informed that they have a  Medication bubble pack system and offer free delivery.  RNCM gave patient contact number for [facility] and informed her of their medication service.  Patient states she is interested and would call [facility].  Patient verbally agreed to telephonic follow up with RNCM.  Patient agreed to follow up by [facility] care management pharmacist.   ASSESSMENT; Patient would benefit from telephonic case management follow up for hypertension and care coordination and follow up related to her depression. PHQ2 - 4 PHQ9 -17  PLAN; RNCM will follow up with patient within 2 week.  RNCM will send patient [facility] care management welcome packet with consent. RNCM will send patients primary MD involvement letter.  RNCM will refer patient to [facility] care management pharmacist.   Quinn Plowman RN,BSN,CCM [facility] Telephonic  [phone]

## 2016-08-12 ENCOUNTER — Ambulatory Visit (INDEPENDENT_AMBULATORY_CARE_PROVIDER_SITE_OTHER): Payer: Medicare Other | Admitting: Family Medicine

## 2016-08-12 VITALS — BP 142/74 | HR 93 | Temp 98.5°F | Wt 159.0 lb

## 2016-08-12 DIAGNOSIS — F419 Anxiety disorder, unspecified: Secondary | ICD-10-CM

## 2016-08-12 DIAGNOSIS — J069 Acute upper respiratory infection, unspecified: Secondary | ICD-10-CM

## 2016-08-12 DIAGNOSIS — R05 Cough: Secondary | ICD-10-CM | POA: Diagnosis not present

## 2016-08-12 DIAGNOSIS — B9789 Other viral agents as the cause of diseases classified elsewhere: Secondary | ICD-10-CM

## 2016-08-12 DIAGNOSIS — F418 Other specified anxiety disorders: Secondary | ICD-10-CM | POA: Diagnosis not present

## 2016-08-12 DIAGNOSIS — F329 Major depressive disorder, single episode, unspecified: Secondary | ICD-10-CM

## 2016-08-12 DIAGNOSIS — R059 Cough, unspecified: Secondary | ICD-10-CM

## 2016-08-12 NOTE — Patient Instructions (Addendum)
Thank you for coming in today. You can take 1 extra strength tylenol every 6 hours as needed for body aches, pain and fever.   I recommend following up for a medicare well exam in 1-3 months.

## 2016-08-12 NOTE — Progress Notes (Signed)
[name] is a 83 y.o. female who presents to [facility] today for  Cough congestion runny nose and depression.   Patient has a several day history of cough congestion runny nose and body aches she notes several family members sick with similar illness. She has not tried any medications yet. She denies any fever, chills, NVD. She denies any trouble breathing.   Depression: Patient has battled with depression and anxiety her entire life. She is allergic or intolerant to most medications. She is currently taking xanax and lexapro both prescribed by a psychiatrist. She also attends counseling. She does not think the Lexapro works very well like to stop it. She notes her symptoms are not quite very well controlled but she denies any SI or HI.   Past Medical History:  Diagnosis Date  . Anxiety   . Arthritis    osteoarthritis  . Asthma   . C. difficile diarrhea   . Candida esophagitis ([place])   . Chronic sinusitis   . Depression   . Depression   . Diverticulosis   . DJD (degenerative joint disease)     L5 compression fracture  . Edema extremities   . Fibromyalgia   . GERD (gastroesophageal reflux disease)   . Gout   . Hyperlipidemia   . Hypertension   . Osteoarthritis   . Osteopenia   . Panic disorder   . Renal failure, unspecified   . Spinal stenosis of lumbar region   . Tubular adenoma of colon    Past Surgical History:  Procedure Laterality Date  . ADENOIDECTOMY    . CARPAL TUNNEL RELEASE  2007, 2009   Bilateral  . CATARACT EXTRACTION, BILATERAL    . CHOANAL ADENIODECTOMY    . DIAGNOSTIC LARYNGOSCOPY N/A 11/05/2015   Procedure: DIAGNOSTIC LARYNGOSCOPY AND ESOPHAGOSCOPY;  Surgeon: Rozetta Nunnery, MD;  Location: [facility];  Service: ENT;  Laterality: N/A;  . ganglion cyct removal on fingers     right  . ganglion cyst  wrist    . KNEE ARTHROSCOPY      right  . NASAL SINUS SURGERY    . SHOULDER SURGERY  2004, 07/30/10, 01/2011   after her right humeral neck fracture, revision hemiarthroplasty 2004 for persistent pain, revision reverse arthroplasty December 2011 for persistent pain, incision and drainage with poly-exchange 01/26/2011 for possible prosthetic joint infection.  . TONSILLECTOMY AND ADENOIDECTOMY    . TOTAL ABDOMINAL HYSTERECTOMY    . TOTAL HIP ARTHROPLASTY  2004   right  . TOTAL KNEE ARTHROPLASTY  2006   right  . TOTAL SHOULDER REPLACEMENT  2002   right   Social History  Substance Use Topics  . Smoking status: Never Smoker  . Smokeless tobacco: Never Used  . Alcohol use No   family history includes Bladder Cancer in her father; Depression in her son; Diabetes in her mother; Hypertension in her mother.  ROS as above:  Medications: Current Outpatient Prescriptions  Medication Sig Dispense Refill  . ALPRAZolam (XANAX) 0.5 MG tablet Take 0.5 mg by mouth 4 (four) times daily as needed for anxiety.     . AMBULATORY NON FORMULARY MEDICATION Rolling Heng.  Dx: Rheumatoid Arthritis.  Use daily as needed for ambulation. 1 Units 0  . amLODipine (NORVASC) 10 MG tablet Take 1 tablet (10 mg total) by mouth daily. 90 tablet 2  . Calcium Carbonate-Vitamin D (CALCIUM 600+D) 600-400 MG-UNIT per tablet Take 1 tablet by mouth  2 (two) times daily.    . diclofenac sodium (VOLTAREN) 1 % GEL Apply 1 application topically 4 (four) times daily.  0  . diphenhydrAMINE (BENADRYL) 25 MG tablet Take 25 mg by mouth every 6 (six) hours as needed. For allergies    . EPINEPHrine (ADRENACLICK) 0.3 A999333 mL IJ SOAJ injection Inject 0.3 mLs (0.3 mg total) into the muscle once. 1 Device 1  . escitalopram (LEXAPRO) 20 MG tablet Take 20 mg by mouth daily.    . fentaNYL (DURAGESIC - DOSED MCG/HR) 100 MCG/HR Place 100 mcg onto the skin every 3 (three) days.    . hydrochlorothiazide (HYDRODIURIL) 25 MG tablet One tablet by mouth every morning for blood  pressure control. 30 tablet 2  . ipratropium (ATROVENT) 0.06 % nasal spray Place 2 sprays into both nostrils 3 (three) times daily as needed for rhinitis. 15 mL 12  . montelukast (SINGULAIR) 10 MG tablet take 1 tablet by mouth at bedtime 30 tablet 3  . nitrofurantoin, macrocrystal-monohydrate, (MACROBID) 100 MG capsule Take 1 capsule (100 mg total) by mouth 2 (two) times daily. For 7 days. 14 capsule 0  . nitrofurantoin, macrocrystal-monohydrate, (MACROBID) 100 MG capsule Take 1 capsule (100 mg total) by mouth 2 (two) times daily. For 10 days. 20 capsule 0  . omeprazole (PRILOSEC) 20 MG capsule take 1 capsule by mouth once daily 90 capsule 1  . ondansetron (ZOFRAN) 4 MG tablet Take 1-2 tablets (4-8 mg total) by mouth every 8 (eight) hours as needed for nausea or vomiting. 30 tablet 1  . oxyCODONE (OXY IR/ROXICODONE) 5 MG immediate release tablet take 1 tablet by mouth three times a day as needed for pain  0  . potassium chloride SA (K-DUR,KLOR-CON) 20 MEQ tablet take 1 tablet by mouth once daily 30 tablet 8  . simvastatin (ZOCOR) 10 MG tablet Take 1 tablet (10 mg total) by mouth daily. 90 tablet 1  . zoledronic acid (RECLAST) 5 MG/100ML SOLN Inject 5 mg into the vein. Once yearly. In June     No current facility-administered medications for this visit.    Allergies  Allergen Reactions  . Bee Venom Anaphylaxis    Has epi-pen  . Ceftin [Cefuroxime Axetil] Anaphylaxis  . Ciprofloxacin Anaphylaxis and Palpitations  . Ephedrine Other (See Comments) and Palpitations    "knocks me out"  . Sulfonamide Derivatives Anaphylaxis  . Telithromycin Anaphylaxis    Reaction: also blurred vision   . Valsartan Anaphylaxis  . Verapamil Anaphylaxis  . Venlafaxine Other (See Comments)    Insomnia, panic  . Beta Adrenergic Blockers     bradycardia  . Cephalosporins     Allergic Reaction  . Clindamycin/Lincomycin     Dry skin and itching/ Cdiff  . Cymbalta [Duloxetine Hcl] Other (See Comments)     Reaction:Confusion and " I fall down"  . Doxazosin     "blurred vision and irritable"  . Gabapentin Other (See Comments)    Reaction: headache  . Hydralazine     HA, Diarrhea  . Lamotrigine     Patient unsure at this time  . Mirtazapine Other (See Comments)    unknown  . Morphine Other (See Comments)    Medication has no effect with pain  . Oxycodone-Acetaminophen Hives  . Pregabalin Swelling  . Prochlorperazine Edisylate   . Pseudoephedrine Other (See Comments)    Reaction: hyperventilates and blacks out  . Relafen [Nabumetone] Swelling  . Zoloft [Sertraline] Other (See Comments)    "nervous/jittery"  . Doxycycline Hives  Health Maintenance Health Maintenance  Topic Date Due  . ZOSTAVAX  02/23/2017 (Originally 04/28/1995)  . PNA vac Low Risk Adult (2 of 2 - PPSV23) 12/29/2016  . TETANUS/TDAP  02/20/2020  . INFLUENZA VACCINE  Completed  . DEXA SCAN  Completed     Exam:  BP (!) 142/74   Pulse 93   Temp 98.5 F (36.9 C) (Oral)   Wt 159 lb (72.1 kg)   SpO2 95%   BMI 31.05 kg/m  Gen: Well NAD HEENT: EOMI,  MMM Clear nasal discharge Lungs: Normal work of breathing. CTABL Heart: RRR no MRG Abd: NABS, Soft. Nondistended, Nontender Exts: Brisk capillary refill, warm and well perfused.  MSK: Thoracic kyphosis and cervical lordosis present. CTL spinal midline are nontender.   psych: Alert and oriented. Affect is slightly flattened. No SI or HI expressed.    No results found for this or any previous visit (from the past 72 hour(s)). No results found.    Assessment and Plan: 83 y.o. female wiviral URI with body aches. Plan for symptomatic management and watchful waiting. Recommend acetaminophen 500 mg 4 times daily.  Anxiety and depression: Not well controlled. Recommend follow back up with psychiatry.   Patient should return in 1-3 months for a Medicare wellness visit      No orders of the defined types were placed in this encounter.   Discussed warning  signs or symptoms. Please see discharge instructions. Patient expresses understanding.

## 2016-08-14 NOTE — Addendum Note (Signed)
Addended by: Quinn Plowman E on: 08/14/2016 01:04 AM   Modules accepted: Orders

## 2016-08-15 ENCOUNTER — Encounter: Payer: Self-pay | Admitting: Sports Medicine

## 2016-08-15 ENCOUNTER — Ambulatory Visit (INDEPENDENT_AMBULATORY_CARE_PROVIDER_SITE_OTHER): Payer: Medicare Other | Admitting: Sports Medicine

## 2016-08-15 DIAGNOSIS — M1612 Unilateral primary osteoarthritis, left hip: Secondary | ICD-10-CM | POA: Diagnosis not present

## 2016-08-15 NOTE — Progress Notes (Signed)
  Subjective:    CC:  Follow-up  HPI: Lateral hip pain resolved after a trochanteric bursa injection, she still had some groin pain, this resolved after a hip joint injection at the last visit.  Past medical history:  Negative.  See flowsheet/record as well for more information.  Surgical history: Negative.  See flowsheet/record as well for more information.  Family history: Negative.  See flowsheet/record as well for more information.  Social history: Negative.  See flowsheet/record as well for more information.  Allergies, and medications have been entered into the medical record, reviewed, and no changes needed.   Review of Systems: No fevers, chills, night sweats, weight loss, chest pain, or shortness of breath.   Objective:    General: Well Developed, well nourished, and in no acute distress.  Neuro: Alert and oriented x3, extra-ocular muscles intact, sensation grossly intact.  HEENT: Normocephalic, atraumatic, pupils equal round reactive to light, neck supple, no masses, no lymphadenopathy, thyroid nonpalpable.  Skin: Warm and dry, no rashes. Cardiac: Regular rate and rhythm, no murmurs rubs or gallops, no lower extremity edema.  Respiratory: Clear to auscultation bilaterally. Not using accessory muscles, speaking in full sentences.  Impression and Recommendations:    Primary osteoarthritis of left hip Trochanteric bursa injection several months ago provided good lateral hip pain relief, we did her joint at the last visit and she now is essentially pain-free from a hip standpoint. Return as needed.

## 2016-08-15 NOTE — Assessment & Plan Note (Signed)
Trochanteric bursa injection several months ago provided good lateral hip pain relief, we did her joint at the last visit and she now is essentially pain-free from a hip standpoint. Return as needed.

## 2016-08-22 ENCOUNTER — Other Ambulatory Visit: Payer: Self-pay

## 2016-08-22 DIAGNOSIS — M069 Rheumatoid arthritis, unspecified: Secondary | ICD-10-CM | POA: Diagnosis not present

## 2016-08-22 DIAGNOSIS — G894 Chronic pain syndrome: Secondary | ICD-10-CM | POA: Diagnosis not present

## 2016-08-22 DIAGNOSIS — G4700 Insomnia, unspecified: Secondary | ICD-10-CM | POA: Diagnosis not present

## 2016-08-22 DIAGNOSIS — M47816 Spondylosis without myelopathy or radiculopathy, lumbar region: Secondary | ICD-10-CM | POA: Diagnosis not present

## 2016-08-23 ENCOUNTER — Other Ambulatory Visit: Payer: Self-pay | Admitting: Pharmacist

## 2016-08-23 NOTE — Patient Outreach (Signed)
[facility]) Care Management  [place]   08/23/2016  [name] 12/04/1932 [id number]  Late entry for 08/24/15.    Subjective:  Patient referred to [place] by [facility] RN telephonic [place] for medication review.    Successful phone outreach to patient on 08/23/16, HIPAA details verified by patient and purpose of call explained to patient.    Patient was willing to review her medications over the phone and she states that she has tolerability issues with multiple medications which per her report limits her medication options for blood pressure management.    Patient states when escitalopram was last increased by her provider, she experienced increased side effects and had to reduce dose.   Patient denies affordability concerns with her medications.    Objective:   Current Medications: Current Outpatient Prescriptions  Medication Sig Dispense Refill  . ALPRAZolam (XANAX) 0.5 MG tablet Take 0.5 mg by mouth 4 (four) times daily as needed for anxiety.     . AMBULATORY NON FORMULARY MEDICATION Rolling Campos.  Dx: Rheumatoid Arthritis.  Use daily as needed for ambulation. 1 Units 0  . amLODipine (NORVASC) 10 MG tablet Take 1 tablet (10 mg total) by mouth daily. 90 tablet 2  . Calcium Carbonate-Vitamin D (CALCIUM 600+D) 600-400 MG-UNIT per tablet Take 1 tablet by mouth 2 (two) times daily.    . diclofenac sodium (VOLTAREN) 1 % GEL Apply 1 application topically 4 (four) times daily.  0  . diphenhydrAMINE (BENADRYL) 25 MG tablet Take 25 mg by mouth every 6 (six) hours as needed. For allergies    . EPINEPHrine (ADRENACLICK) 0.3 A999333 mL IJ SOAJ injection Inject 0.3 mLs (0.3 mg total) into the muscle once. 1 Device 1  . escitalopram (LEXAPRO) 20 MG tablet Take 20 mg by mouth daily.    . fentaNYL (DURAGESIC - DOSED MCG/HR) 100 MCG/HR Place 100 mcg onto the skin every 3 (three) days.    . hydrochlorothiazide (HYDRODIURIL) 25 MG tablet One tablet by mouth every  morning for blood pressure control. 30 tablet 2  . ipratropium (ATROVENT) 0.06 % nasal spray Place 2 sprays into both nostrils 3 (three) times daily as needed for rhinitis. 15 mL 12  . montelukast (SINGULAIR) 10 MG tablet take 1 tablet by mouth at bedtime 30 tablet 3  . nitrofurantoin, macrocrystal-monohydrate, (MACROBID) 100 MG capsule Take 1 capsule (100 mg total) by mouth 2 (two) times daily. For 7 days. 14 capsule 0  . nitrofurantoin, macrocrystal-monohydrate, (MACROBID) 100 MG capsule Take 1 capsule (100 mg total) by mouth 2 (two) times daily. For 10 days. 20 capsule 0  . omeprazole (PRILOSEC) 20 MG capsule take 1 capsule by mouth once daily 90 capsule 1  . ondansetron (ZOFRAN) 4 MG tablet Take 1-2 tablets (4-8 mg total) by mouth every 8 (eight) hours as needed for nausea or vomiting. 30 tablet 1  . oxyCODONE (OXY IR/ROXICODONE) 5 MG immediate release tablet take 1 tablet by mouth three times a day as needed for pain  0  . potassium chloride SA (K-DUR,KLOR-CON) 20 MEQ tablet take 1 tablet by mouth once daily 30 tablet 8  . simvastatin (ZOCOR) 10 MG tablet Take 1 tablet (10 mg total) by mouth daily. 90 tablet 1  . zoledronic acid (RECLAST) 5 MG/100ML SOLN Inject 5 mg into the vein. Once yearly. In June     No current facility-administered medications for this visit.     Functional Status: In your present state of health, do you  have any difficulty performing the following activities: 07/20/2016 02/12/2016  Hearing? N N  Vision? N N  Difficulty concentrating or making decisions? N N  Walking or climbing stairs? Y Y  Dressing or bathing? N N  Doing errands, shopping? Y -  Conservation officer, nature and eating ? Y -  Using the Toilet? N -  In the past six months, have you accidently leaked urine? N -  Do you have problems with loss of bowel control? N -  Managing your Medications? Y -  Managing your Finances? Y -  Housekeeping or managing your Housekeeping? Y -  Some recent data might be hidden     Fall/Depression Screening: PHQ 2/9 Scores 08/11/2016 07/20/2016 05/09/2011 03/31/2011  PHQ - 2 Score 4 - 0 0  PHQ- 9 Score 17 - - -  Exception Documentation - Patient refusal - -    Assessment:  Medication review per patient report:   Drugs sorted by system:  Neurologic/Psychologic: -alprazolam -escitalopram  Cardiovascular: -amlodipine -hydrochlorothiazide  -simvastatin   Pulmonary/Allergy: -epinephrine (Epi-Pen)  -diphenhydramine---patient reports has not needed to use recently.   -ipratropium nasal spray -montelukast  Gastrointestinal: -omeprazole  -ondansetron as needed  Endocrine: -zoledronic acid (Reclast) once yearly   Topical: -diclofenac gel   Pain: -fentanyl patch  -oxycodone IR  Vitamins/Minerals: -calcium/vitamin D -potassium   Medications to avoid in the elderly:  -diphenhydramine---increased risk of adverse effects in geriatric population     Patient counseling: Counseled patient that with her medication intolerances, recommend she discuss alternative agents for escitalopram with her provider.  She reports she has an appointment with Rickey Primus, NP in April.  Suggested patient contact her provider to see if she can be seen sooner to discuss her medication concerns.   Counseled patient that some patients respond differently to different medications in the same medication class.    Counseled patient her anti-hypertensive medication options are limited due to her significant amount of medication intolerances.   Despite having allergy listed to oxycodone/acetaminophen, patient reports she can tolerate oxycodone without difficulty.    Medication adherence packaging Review of 08/11/16 telephonic note from [facility] indicates patient was interested in blister packaging of her medications.    Today patient states she will stay with her current pharmacy of choice and not change to a pharmacy that offers blister packaging.    Plan:  Will not open a  pharmacy case as patient declines further pharmacy needs at this time.   Will route note to PCP.   Will update [facility] RN Telephonic [place].  Patient confirms she has [facility] Pharmacist phone number should she have new pharmacy issues/concerns/questions.   Karrie Meres, PharmD, [place] [phone]

## 2016-08-25 ENCOUNTER — Ambulatory Visit: Payer: Self-pay | Admitting: Sports Medicine

## 2016-08-26 ENCOUNTER — Ambulatory Visit: Payer: Self-pay

## 2016-08-26 ENCOUNTER — Other Ambulatory Visit: Payer: Self-pay | Admitting: Osteopathic Medicine

## 2016-08-31 DIAGNOSIS — F331 Major depressive disorder, recurrent, moderate: Secondary | ICD-10-CM | POA: Diagnosis not present

## 2016-08-31 DIAGNOSIS — F341 Dysthymic disorder: Secondary | ICD-10-CM | POA: Diagnosis not present

## 2016-08-31 DIAGNOSIS — F410 Panic disorder [episodic paroxysmal anxiety] without agoraphobia: Secondary | ICD-10-CM | POA: Diagnosis not present

## 2016-09-02 ENCOUNTER — Other Ambulatory Visit: Payer: Self-pay | Admitting: Family Medicine

## 2016-09-02 ENCOUNTER — Other Ambulatory Visit: Payer: Self-pay

## 2016-09-02 ENCOUNTER — Other Ambulatory Visit: Payer: Self-pay | Admitting: Osteopathic Medicine

## 2016-09-02 NOTE — Patient Outreach (Signed)
[facility]) Care Management  [place]  09/02/2016   [name] 11/04/1932 [id number]  Subjective: Telephone call to patient regarding primary MD referral.  HIPAA verified with patient. Patient states she spoke with the pharmacist regarding prepackaging her medications. Patient states she does not think this will work for her. Patient states she decided to fill her own pill box. Patient states she spoke with [facility] care management pharmacist, Lennette Bihari. States he reviewed her medications.  Patient states she saw the pain management doctor on 08/29/16. Patient states she had a follow up for the injection she received in her groin area for pain. Patient states the injection has helped the pain in her hip. Patient states she continues with her medication for pain in her back and left hip.   Patient states she has a follow up visit at her primary MD office on 08/15/16.   Patient states she continues to take her medications. Patient states she is not monitoring her blood pressure at home. Patient states she does have a blood pressure monitor at home. Patient states the doctor said her blood pressure was good when she saw him in the office on 08/15/16.  RNCM requested patient to start monitoring blood pressure at home and recording. Patient verbalized agreement. Patient states she doesn't have a good appetite. Patient reports she tries to adhere to a low salt diet.  RNCM informed patient she would send her EMMI education material on hypertension and low salt diet.  Patient states she has not received the [facility] care management welcome packet with consent.   RNCM will resend.  Patient verbally agreed to next telephone outreach with [facility].   Objective: see assessment  Encounter Medications:  Outpatient Encounter Prescriptions as of 09/02/2016  Medication Sig Note  . ALPRAZolam (XANAX) 0.5 MG tablet Take 0.5 mg by mouth 4 (four) times daily as needed for anxiety.  08/24/2016: Patient reports  she usually takes morning and at bedtime.    . AMBULATORY NON FORMULARY MEDICATION Rolling Climer.  Dx: Rheumatoid Arthritis.  Use daily as needed for ambulation.   [name] amLODipine (NORVASC) 10 MG tablet Take 1 tablet (10 mg total) by mouth daily.   . Calcium Carbonate-Vitamin D (CALCIUM 600+D) 600-400 MG-UNIT per tablet Take 1 tablet by mouth 2 (two) times daily.   . diclofenac sodium (VOLTAREN) 1 % GEL Apply 1 application topically 4 (four) times daily. 11/16/2015: Received from: External Pharmacy Received Sig:   . diphenhydrAMINE (BENADRYL) 25 MG tablet Take 25 mg by mouth every 6 (six) hours as needed. For allergies   . escitalopram (LEXAPRO) 20 MG tablet Take 20 mg by mouth daily.   . fentaNYL (DURAGESIC - DOSED MCG/HR) 12 MCG/HR Place 12.5 mcg onto the skin every 3 (three) days.   . hydrochlorothiazide (HYDRODIURIL) 25 MG tablet One tablet by mouth every morning for blood pressure control.   . montelukast (SINGULAIR) 10 MG tablet take 1 tablet by mouth at bedtime   . omeprazole (PRILOSEC) 20 MG capsule take 1 capsule by mouth once daily   . oxyCODONE (OXY IR/ROXICODONE) 5 MG immediate release tablet take 1 tablet by mouth three times a day as needed for pain 04/19/2016: Received from: External Pharmacy  . potassium chloride SA (K-DUR,KLOR-CON) 20 MEQ tablet take 1 tablet by mouth once daily   . simvastatin (ZOCOR) 10 MG tablet Take 1 tablet (10 mg total) by mouth daily.   [name] EPINEPHrine (ADRENACLICK) 0.3 A999333 mL IJ SOAJ injection Inject 0.3 mLs (0.3  mg total) into the muscle once. (Patient not taking: Reported on 09/02/2016)   . ipratropium (ATROVENT) 0.06 % nasal spray Place 2 sprays into both nostrils 3 (three) times daily as needed for rhinitis. (Patient not taking: Reported on 08/24/2016)   . ondansetron (ZOFRAN) 4 MG tablet Take 1-2 tablets (4-8 mg total) by mouth every 8 (eight) hours as needed for nausea or vomiting. (Patient not taking: Reported on 08/24/2016)   . zoledronic acid (RECLAST) 5  MG/100ML SOLN Inject 5 mg into the vein. Once yearly. In June    No facility-administered encounter medications on file as of 09/02/2016.     Functional Status:  In your present state of health, do you have any difficulty performing the following activities: 09/02/2016 07/20/2016  Hearing? Y N  Vision? N N  Difficulty concentrating or making decisions? N N  Walking or climbing stairs? Y Y  Dressing or bathing? N N  Doing errands, shopping? N Y  Conservation officer, nature and eating ? Y Y  Using the Toilet? N N  In the past six months, have you accidently leaked urine? Y N  Do you have problems with loss of bowel control? N N  Managing your Medications? N Y  Managing your Finances? [name]  Housekeeping or managing your Housekeeping? [name]  Some recent data might be hidden    Fall/Depression Screening: PHQ 2/9 Scores 09/02/2016 08/11/2016 07/20/2016 05/09/2011 03/31/2011  PHQ - 2 Score 4 4 - 0 0  PHQ- 9 Score 17 17 - - -  Exception Documentation - - Patient refusal - -   Fall Risk  09/02/2016 07/20/2016  Falls in the past year? No Yes  Number falls in past yr: - 2 or more  Injury with Fall? - No  Risk Factor Category  - High Fall Risk  Risk for fall due to : - History of fall(s)  Follow up - Education provided;Falls prevention discussed    THN CM Care Plan Problem One   Flowsheet Row Most Recent Value  Care Plan Problem One  knowledge deficit related to hypertension  Role Documenting the Problem One  Care Management Telephonic Coordinator  Care Plan for Problem One  Active  THN Long Term Goal (31-90 days)  Patient will report her blood pressure being at goal 120/80 within 60 days  THN Long Term Goal Start Date  09/02/16  Interventions for Problem One Long Term Goal  RNCM mailed EMMI education material to patient. RNCM advised patient to monitor and record blood pressure 1-2 times per week.  RNCM advised patient to continue to ahere to low sodium diet and take medication as prescribed.   [facility]N CM Short  Term Goal #1 (0-30 days)  Patient will report she is checking her blood pressure 1-2 times a week within 30 days  THN CM Short Term Goal #1 Start Date  09/02/16  Interventions for Short Term Goal #1  RNCM discussed with patient importance of monitoring blood pressure.   THN CM Short Term Goal #2 (0-30 days)  Patient will report she is adhering to a low salt diet within 30 days.  [facility]N CM Short Term Goal #2 Start Date  09/02/16  Interventions for Short Term Goal #2  RNCM mailed patient EMMI education material  regarding low sodium diet.      Assessment:  Primary MD referral for hypertension.  Ongoing disease management for hypertension.   Plan:  RNCM will follow up with patient within the month of March 2018 RNCM will send  patient  EMMI education material for hypertension and low salt diet.  Patient will report her monitored blood pressure readings at next outreach with RNCM.  Quinn Plowman RN,BSN,CCM [facility] Telephonic  [phone]

## 2016-09-07 ENCOUNTER — Ambulatory Visit (INDEPENDENT_AMBULATORY_CARE_PROVIDER_SITE_OTHER): Payer: Medicare Other | Admitting: Family Medicine

## 2016-09-07 VITALS — BP 110/70 | HR 72 | Temp 97.6°F | Wt 162.0 lb

## 2016-09-07 VITALS — BP 110/70 | HR 72 | Temp 97.6°F | Ht 60.0 in | Wt 162.1 lb

## 2016-09-07 DIAGNOSIS — F331 Major depressive disorder, recurrent, moderate: Secondary | ICD-10-CM | POA: Diagnosis not present

## 2016-09-07 DIAGNOSIS — H9193 Unspecified hearing loss, bilateral: Secondary | ICD-10-CM | POA: Diagnosis not present

## 2016-09-07 DIAGNOSIS — M153 Secondary multiple arthritis: Secondary | ICD-10-CM

## 2016-09-07 DIAGNOSIS — M069 Rheumatoid arthritis, unspecified: Secondary | ICD-10-CM | POA: Diagnosis not present

## 2016-09-07 DIAGNOSIS — Z0000 Encounter for general adult medical examination without abnormal findings: Secondary | ICD-10-CM

## 2016-09-07 MED ORDER — BUPROPION HCL ER (XL) 150 MG PO TB24: 150.0000 mg | ORAL_TABLET | Freq: Every day | ORAL | 1 refills | Status: DC

## 2016-09-07 NOTE — Patient Instructions (Addendum)
Health maintenance:Due for Pneumonia vaccine in 11-2016.    Abnormal screenings: None.   Patient concerns: Hearing aid will need maintenance and feels she cannot afford it-We will reach out to [facility] or Amy, the care guides to see what they may have information on to help out with any extra costs.   Nurse concerns: Depressive episodes.   Next PCP appt: As advised by PCP.

## 2016-09-07 NOTE — Progress Notes (Signed)
Subjective:   [name] is a 83 y.o. female who presents for an Initial Medicare Annual Wellness Visit.  Review of Systems            Objective:    Today's Vitals   09/07/16 0928  BP: 110/70  Pulse: 72  Temp: 97.6 F (36.4 C)  TempSrc: Oral  SpO2: 97%  Weight: 162 lb 1.6 oz (73.5 kg)  Height: 5' (1.524 m)   Body mass index is 31.66 kg/m.   Current Medications (verified) Outpatient Encounter Prescriptions as of 09/07/2016  Medication Sig  . ALPRAZolam (XANAX) 0.5 MG tablet Take 0.5 mg by mouth 4 (four) times daily as needed for anxiety.   . AMBULATORY NON FORMULARY MEDICATION Rolling Schorr.  Dx: Rheumatoid Arthritis.  Use daily as needed for ambulation.  [name] amLODipine (NORVASC) 10 MG tablet Take 1 tablet (10 mg total) by mouth daily.  . Calcium Carbonate-Vitamin D (CALCIUM 600+D) 600-400 MG-UNIT per tablet Take 1 tablet by mouth 2 (two) times daily.  . diclofenac sodium (VOLTAREN) 1 % GEL Apply 1 application topically 4 (four) times daily.  . diphenhydrAMINE (BENADRYL) 25 MG tablet Take 25 mg by mouth every 6 (six) hours as needed. For allergies  . EPINEPHrine (ADRENACLICK) 0.3 [id number] mL IJ SOAJ injection Inject 0.3 mLs (0.3 mg total) into the muscle once.  . escitalopram (LEXAPRO) 20 MG tablet Take 20 mg by mouth daily.  . fentaNYL (DURAGESIC - DOSED MCG/HR) 12 MCG/HR Place 12.5 mcg onto the skin every 3 (three) days.  . hydrochlorothiazide (HYDRODIURIL) 25 MG tablet One tablet by mouth every morning for blood pressure control.  [name] ipratropium (ATROVENT) 0.06 % nasal spray Place 2 sprays into both nostrils 3 (three) times daily as needed for rhinitis.  [name] montelukast (SINGULAIR) 10 MG tablet take 1 tablet by mouth at bedtime  . ondansetron (ZOFRAN) 4 MG tablet Take 1-2 tablets (4-8 mg total) by mouth every 8 (eight) hours as needed for nausea or vomiting.  [name] oxyCODONE (OXY IR/ROXICODONE) 5 MG immediate release tablet take 1 tablet by mouth three times a day as needed  for pain  . potassium chloride SA (K-DUR,KLOR-CON) 20 MEQ tablet take 1 tablet by mouth once daily  . simvastatin (ZOCOR) 10 MG tablet Take 1 tablet (10 mg total) by mouth daily.  . zoledronic acid (RECLAST) 5 MG/100ML SOLN Inject 5 mg into the vein. Once yearly. In June  . [DISCONTINUED] omeprazole (PRILOSEC) 20 MG capsule take 1 capsule by mouth once daily  . [DISCONTINUED] omeprazole (PRILOSEC) 20 MG capsule take 1 capsule by mouth once daily   No facility-administered encounter medications on file as of 09/07/2016.     Allergies (verified) Bee venom; Ceftin [cefuroxime axetil]; Ciprofloxacin; Ephedrine; Sulfonamide derivatives; Telithromycin; Valsartan; Verapamil; Venlafaxine; Beta adrenergic blockers; Cephalosporins; Clindamycin/lincomycin; Cymbalta [duloxetine hcl]; Doxazosin; Gabapentin; Hydralazine; Lamotrigine; Mirtazapine; Morphine; Oxycodone-acetaminophen; Pregabalin; Prochlorperazine edisylate; Pseudoephedrine; Relafen [nabumetone]; Zoloft [sertraline]; and Doxycycline   History: Past Medical History:  Diagnosis Date  . Anxiety   . Arthritis    osteoarthritis  . Asthma   . C. difficile diarrhea   . Candida esophagitis ([place])   . Chronic sinusitis   . Depression   . Depression   . Diverticulosis   . DJD (degenerative joint disease)     L5 compression fracture  . Edema extremities   . Fibromyalgia   . GERD (gastroesophageal reflux disease)   . Gout   . Hyperlipidemia   . Hypertension   . Osteoarthritis   .  Osteopenia   . Panic disorder   . Renal failure, unspecified   . Spinal stenosis of lumbar region   . Tubular adenoma of colon    Past Surgical History:  Procedure Laterality Date  . ADENOIDECTOMY    . CARPAL TUNNEL RELEASE  2007, 2009   Bilateral  . CATARACT EXTRACTION, BILATERAL    . CHOANAL ADENIODECTOMY    . DIAGNOSTIC LARYNGOSCOPY N/A 11/05/2015   Procedure: DIAGNOSTIC LARYNGOSCOPY AND ESOPHAGOSCOPY;  Surgeon: Rozetta Nunnery, MD;  Location: [facility];   Service: ENT;  Laterality: N/A;  . ganglion cyct removal on fingers     right  . ganglion cyst  wrist    . KNEE ARTHROSCOPY     right  . NASAL SINUS SURGERY    . SHOULDER SURGERY  2004, 07/30/10, 01/2011   after her right humeral neck fracture, revision hemiarthroplasty 2004 for persistent pain, revision reverse arthroplasty December 2011 for persistent pain, incision and drainage with poly-exchange 01/26/2011 for possible prosthetic joint infection.  . TONSILLECTOMY AND ADENOIDECTOMY    . TOTAL ABDOMINAL HYSTERECTOMY    . TOTAL HIP ARTHROPLASTY  2004   right  . TOTAL KNEE ARTHROPLASTY  2006   right  . TOTAL SHOULDER REPLACEMENT  2002   right   Family History  Problem Relation Age of Onset  . Bladder Cancer Father   . Diabetes Mother   . Hypertension Mother   . Breast cancer    . Colon polyps    . Depression Son    Social History   Occupational History  . Not on file.   Social History Main Topics  . Smoking status: Never Smoker  . Smokeless tobacco: Never Used  . Alcohol use No  . Drug use: No  . Sexual activity: No    Tobacco Counseling Counseling given: Not Answered   Activities of Daily Living In your present state of health, do you have any difficulty performing the following activities: 09/02/2016 07/20/2016  Hearing? Y N  Vision? N N  Difficulty concentrating or making decisions? N N  Walking or climbing stairs? Y Y  Dressing or bathing? N N  Doing errands, shopping? N Y  Conservation officer, nature and eating ? Y Y  Using the Toilet? N N  In the past six months, have you accidently leaked urine? Y N  Do you have problems with loss of bowel control? N N  Managing your Medications? N Y  Managing your Finances? [name]  Housekeeping or managing your Housekeeping? [name]  Some recent data might be hidden    Immunizations and Health Maintenance Immunization History  Administered Date(s) Administered  . Influenza Split 04/14/2011  . Influenza,inj,Quad PF,36+ Mos 04/10/2014   . Influenza-Unspecified 04/02/2015, 04/20/2016  . PPD Test 01/04/2012  . Pneumococcal Conjugate-13 12/30/2015  . Td 02/19/2010   There are no preventive care reminders to display for this patient.  Patient Care Team: Gregor Hams, MD as PCP - General (Family Medicine) Dannielle Karvonen, RN as [place] any recent Medical Services you may have received from other than [facility] providers in the past year (date may be approximate).     Assessment:   This is a routine wellness examination for [place].   Hearing/Vision screen No exam data present  Dietary issues and exercise activities discussed:    Goals    None     Depression Screen PHQ 2/9 Scores 09/02/2016 08/11/2016 07/20/2016 05/09/2011 03/31/2011  PHQ - 2 Score  4 4 - 0 0  PHQ- 9 Score 17 17 - - -  Exception Documentation - - Patient refusal - -    Fall Risk Fall Risk  09/02/2016 07/20/2016  Falls in the past year? No Yes  Number falls in past yr: - 2 or more  Injury with Fall? - No  Risk Factor Category  - High Fall Risk  Risk for fall due to : - History of fall(s)  Follow up - Education provided;Falls prevention discussed    Cognitive Function: Normal Normal 6CIT today but pt. Is depressed and affect is flat.        Screening Tests Health Maintenance  Topic Date Due  . ZOSTAVAX  02/23/2017 (Originally 04/28/1995)  . PNA vac Low Risk Adult (2 of 2 - PPSV23) 12/29/2016  . TETANUS/TDAP  02/20/2020  . INFLUENZA VACCINE  Completed  . DEXA SCAN  Completed      Plan:  I have personally reviewed and addressed the Medicare Annual Wellness questionnaire and have noted the following in the patient's chart:  A. Medical and social history B. Use of alcohol, tobacco or illicit drugs  C. Current medications and supplements D. Functional ability and status E.  Nutritional status F.  Physical activity G. Advance directives I.  Hospitalizations, surgeries, and ER visits in previous 12  months J.  [place] to include cognitive, depression L. Referrals and appointments - Referring to Care Guide for info regarding maintenance of hearing aid.  In addition, I have reviewed and discussed with patient certain preventive protocols, quality metrics, and best practice recommendations. A written personalized care plan for preventive services as well as general preventive health recommendations were provided to patient.  Signed,   [name], RN

## 2016-09-07 NOTE — Patient Instructions (Signed)
Thank you for coming in today. We will try to get some information about hearing aids.  Also we are adding wellbutrin to you medicines to work with lexapro.  Recheck in 1-2 months or sooner if needed.   Bupropion extended-release tablets (Depression/Mood Disorders) What is this medicine? BUPROPION (byoo PROE pee on) is used to treat depression. This medicine may be used for other purposes; ask your health care provider or pharmacist if you have questions. COMMON BRAND NAME(S): Aplenzin, Budeprion XL, Forfivo XL, Wellbutrin XL What should I tell my health care provider before I take this medicine? They need to know if you have any of these conditions: -an eating disorder, such as anorexia or bulimia -bipolar disorder or psychosis -diabetes or high blood sugar, treated with medication -glaucoma -head injury or brain tumor -heart disease, previous heart attack, or irregular heart beat -high blood pressure -kidney or liver disease -seizures (convulsions) -suicidal thoughts or a previous suicide attempt -Tourette's syndrome -weight loss -an unusual or allergic reaction to bupropion, other medicines, foods, dyes, or preservatives -breast-feeding -pregnant or trying to become pregnant How should I use this medicine? Take this medicine by mouth with a glass of water. Follow the directions on the prescription label. You can take it with or without food. If it upsets your stomach, take it with food. Do not crush, chew, or cut these tablets. This medicine is taken once daily at the same time each day. Do not take your medicine more often than directed. Do not stop taking this medicine suddenly except upon the advice of your doctor. Stopping this medicine too quickly may cause serious side effects or your condition may worsen. A special MedGuide will be given to you by the pharmacist with each prescription and refill. Be sure to read this information carefully each time. Talk to your pediatrician  regarding the use of this medicine in children. Special care may be needed. Overdosage: If you think you have taken too much of this medicine contact a poison control center or emergency room at once. NOTE: This medicine is only for you. Do not share this medicine with others. What if I miss a dose? If you miss a dose, skip the missed dose and take your next tablet at the regular time. Do not take double or extra doses. What may interact with this medicine? Do not take this medicine with any of the following medications: -linezolid -MAOIs like Azilect, Carbex, Eldepryl, Marplan, Nardil, and Parnate -methylene blue (injected into a vein) -other medicines that contain bupropion like Zyban This medicine may also interact with the following medications: -alcohol -certain medicines for anxiety or sleep -certain medicines for blood pressure like metoprolol, propranolol -certain medicines for depression or psychotic disturbances -certain medicines for HIV or AIDS like efavirenz, lopinavir, nelfinavir, ritonavir -certain medicines for irregular heart beat like propafenone, flecainide -certain medicines for Parkinson's disease like amantadine, levodopa -certain medicines for seizures like carbamazepine, phenytoin, phenobarbital -cimetidine -clopidogrel -cyclophosphamide -digoxin -furazolidone -isoniazid -nicotine -orphenadrine -procarbazine -steroid medicines like prednisone or cortisone -stimulant medicines for attention disorders, weight loss, or to stay awake -tamoxifen -theophylline -thiotepa -ticlopidine -tramadol -warfarin This list may not describe all possible interactions. Give your health care provider a list of all the medicines, herbs, non-prescription drugs, or dietary supplements you use. Also tell them if you smoke, drink alcohol, or use illegal drugs. Some items may interact with your medicine. What should I watch for while using this medicine? Tell your doctor if your  symptoms do not get better  or if they get worse. Visit your doctor or health care professional for regular checks on your progress. Because it may take several weeks to see the full effects of this medicine, it is important to continue your treatment as prescribed by your doctor. Patients and their families should watch out for new or worsening thoughts of suicide or depression. Also watch out for sudden changes in feelings such as feeling anxious, agitated, panicky, irritable, hostile, aggressive, impulsive, severely restless, overly excited and hyperactive, or not being able to sleep. If this happens, especially at the beginning of treatment or after a change in dose, call your health care professional. Avoid alcoholic drinks while taking this medicine. Drinking large amounts of alcoholic beverages, using sleeping or anxiety medicines, or quickly stopping the use of these agents while taking this medicine may increase your risk for a seizure. Do not drive or use heavy machinery until you know how this medicine affects you. This medicine can impair your ability to perform these tasks. Do not take this medicine close to bedtime. It may prevent you from sleeping. Your mouth may get dry. Chewing sugarless gum or sucking hard candy, and drinking plenty of water may help. Contact your doctor if the problem does not go away or is severe. The tablet shell for some brands of this medicine does not dissolve. This is normal. The tablet shell may appear whole in the stool. This is not a cause for concern. What side effects may I notice from receiving this medicine? Side effects that you should report to your doctor or health care professional as soon as possible: -allergic reactions like skin rash, itching or hives, swelling of the face, lips, or tongue -breathing problems -changes in vision -confusion -elevated mood, decreased need for sleep, racing thoughts, impulsive behavior -fast or irregular  heartbeat -hallucinations, loss of contact with reality -increased blood pressure -redness, blistering, peeling or loosening of the skin, including inside the mouth -seizures -suicidal thoughts or other mood changes -unusually weak or tired -vomiting Side effects that usually do not require medical attention (report to your doctor or health care professional if they continue or are bothersome): -constipation -headache -loss of appetite -nausea -tremors -weight loss This list may not describe all possible side effects. Call your doctor for medical advice about side effects. You may report side effects to FDA at 1-800-FDA-1088. Where should I keep my medicine? Keep out of the reach of children. Store at room temperature between 15 and 30 degrees C (59 and 86 degrees F). Throw away any unused medicine after the expiration date. NOTE: This sheet is a summary. It may not cover all possible information. If you have questions about this medicine, talk to your doctor, pharmacist, or health care provider.  2017 Elsevier/Gold Standard (2016-01-08 13:55:13)

## 2016-09-07 NOTE — Progress Notes (Signed)
Medical screening examination/treatment/procedure(s) were performed by a  non-physician practitioner and as the supervising physician I was immediately available for consultation/collaboration.  Lynne Leader, MD

## 2016-09-07 NOTE — Progress Notes (Signed)
[name] is a 83 y.o. female who presents to [place]: [place] today for discuss back pain, hearing difficulty, and depression.  Patient has chronic pain due to multiple different arthritis locations. She is receiving fentanyl and oxycodone for pain management. She has the fentanyl has become more expensive over the last year with a change in insurance. She now spends at least 30 hours a month on fentanyl and finds it to be quite expensive. She's not sure how well it works as well. She continues to have considerable pain at times.  Depression: Patient continues to experience lifelong depression. She's been seen by multiple different providers are currently is receiving counseling. She identifies her husband and family as not been very supportive as her main problem.  She denies any SI or HI. She takes Lexapro 20 mg daily and does not feel it is very effective. She's been on multiple different medications that have also not been effective in her life. She can't recall he's ever taking Wellbutrin but doesn't think she's ever had any problems with it in the past.  Hearing difficulty. Patient has been using hearing aids in the past but finds them to be very expensive. She had a Medicare annual on his exam today and had poor hearing acuity identified.   Past Medical History:  Diagnosis Date  . Anxiety   . Arthritis    osteoarthritis  . Asthma   . C. difficile diarrhea   . Candida esophagitis ([place])   . Chronic sinusitis   . Depression   . Depression   . Diverticulosis   . DJD (degenerative joint disease)     L5 compression fracture  . Edema extremities   . Fibromyalgia   . GERD (gastroesophageal reflux disease)   . Gout   . Hyperlipidemia   . Hypertension   . Osteoarthritis   . Osteopenia   . Panic disorder   . Renal failure, unspecified   . Spinal stenosis of  lumbar region   . Tubular adenoma of colon    Past Surgical History:  Procedure Laterality Date  . ADENOIDECTOMY    . CARPAL TUNNEL RELEASE  2007, 2009   Bilateral  . CATARACT EXTRACTION, BILATERAL    . CHOANAL ADENIODECTOMY    . DIAGNOSTIC LARYNGOSCOPY N/A 11/05/2015   Procedure: DIAGNOSTIC LARYNGOSCOPY AND ESOPHAGOSCOPY;  Surgeon: Rozetta Nunnery, MD;  Location: [facility];  Service: ENT;  Laterality: N/A;  . ganglion cyct removal on fingers     right  . ganglion cyst  wrist    . KNEE ARTHROSCOPY     right  . NASAL SINUS SURGERY    . SHOULDER SURGERY  2004, 07/30/10, 01/2011   after her right humeral neck fracture, revision hemiarthroplasty 2004 for persistent pain, revision reverse arthroplasty December 2011 for persistent pain, incision and drainage with poly-exchange 01/26/2011 for possible prosthetic joint infection.  . TONSILLECTOMY AND ADENOIDECTOMY    . TOTAL ABDOMINAL HYSTERECTOMY    . TOTAL HIP ARTHROPLASTY  2004   right  . TOTAL KNEE ARTHROPLASTY  2006   right  . TOTAL SHOULDER REPLACEMENT  2002   right   Social History  Substance Use Topics  . Smoking status: Never Smoker  . Smokeless tobacco: Never Used  . Alcohol use No   family history includes Bladder Cancer in her father; Depression in her son; Diabetes in her mother; Hypertension in her mother.  ROS as above:  Medications: Current  Outpatient Prescriptions  Medication Sig Dispense Refill  . ALPRAZolam (XANAX) 0.5 MG tablet Take 0.5 mg by mouth 4 (four) times daily as needed for anxiety.     . AMBULATORY NON FORMULARY MEDICATION [place].  Dx: Rheumatoid Arthritis.  Use daily as needed for ambulation. 1 Units 0  . amLODipine (NORVASC) 10 MG tablet Take 1 tablet (10 mg total) by mouth daily. 90 tablet 2  . buPROPion (WELLBUTRIN XL) 150 MG 24 hr tablet Take 1 tablet (150 mg total) by mouth daily. 30 tablet 1  . Calcium Carbonate-Vitamin D (CALCIUM 600+D) 600-400 MG-UNIT per tablet Take 1 tablet by  mouth 2 (two) times daily.    . diclofenac sodium (VOLTAREN) 1 % GEL Apply 1 application topically 4 (four) times daily.  0  . diphenhydrAMINE (BENADRYL) 25 MG tablet Take 25 mg by mouth every 6 (six) hours as needed. For allergies    . EPINEPHrine (ADRENACLICK) 0.3 HA/1.9 mL IJ SOAJ injection Inject 0.3 mLs (0.3 mg total) into the muscle once. 1 Device 1  . escitalopram (LEXAPRO) 20 MG tablet Take 20 mg by mouth daily.    . fentaNYL (DURAGESIC - DOSED MCG/HR) 12 MCG/HR Place 12.5 mcg onto the skin every 3 (three) days.    . hydrochlorothiazide (HYDRODIURIL) 25 MG tablet One tablet by mouth every morning for blood pressure control. 30 tablet 2  . ipratropium (ATROVENT) 0.06 % nasal spray Place 2 sprays into both nostrils 3 (three) times daily as needed for rhinitis. 15 mL 12  . montelukast (SINGULAIR) 10 MG tablet take 1 tablet by mouth at bedtime 30 tablet 3  . ondansetron (ZOFRAN) 4 MG tablet Take 1-2 tablets (4-8 mg total) by mouth every 8 (eight) hours as needed for nausea or vomiting. 30 tablet 1  . oxyCODONE (OXY IR/ROXICODONE) 5 MG immediate release tablet take 1 tablet by mouth three times a day as needed for pain  0  . potassium chloride SA (K-DUR,KLOR-CON) 20 MEQ tablet take 1 tablet by mouth once daily 30 tablet 8  . simvastatin (ZOCOR) 10 MG tablet Take 1 tablet (10 mg total) by mouth daily. 90 tablet 1  . zoledronic acid (RECLAST) 5 MG/100ML SOLN Inject 5 mg into the vein. Once yearly. In June     No current facility-administered medications for this visit.    Allergies  Allergen Reactions  . Bee Venom Anaphylaxis    Has epi-pen  . Ceftin [Cefuroxime Axetil] Anaphylaxis  . Ciprofloxacin Anaphylaxis and Palpitations  . Ephedrine Other (See Comments) and Palpitations    "knocks me out"  . Sulfonamide Derivatives Anaphylaxis  . Telithromycin Anaphylaxis    Reaction: also blurred vision   . Valsartan Anaphylaxis  . Verapamil Anaphylaxis  . Venlafaxine Other (See Comments)     Insomnia, panic  . Beta Adrenergic Blockers     bradycardia  . Cephalosporins     Allergic Reaction  . Clindamycin/Lincomycin     Dry skin and itching/ Cdiff  . Cymbalta [Duloxetine Hcl] Other (See Comments)    Reaction:Confusion and " I fall down"  . Doxazosin     "blurred vision and irritable"  . Gabapentin Other (See Comments)    Reaction: headache  . Hydralazine     HA, Diarrhea  . Lamotrigine     Patient unsure at this time  . Mirtazapine Other (See Comments)    unknown  . Morphine Other (See Comments)    Medication has no effect with pain  . Oxycodone-Acetaminophen Hives  . Pregabalin  Swelling  . Prochlorperazine Edisylate   . Pseudoephedrine Other (See Comments)    Reaction: hyperventilates and blacks out  . Relafen [Nabumetone] Swelling  . Zoloft [Sertraline] Other (See Comments)    "nervous/jittery"  . Doxycycline Hives    Health Maintenance Health Maintenance  Topic Date Due  . ZOSTAVAX  02/23/2017 (Originally 04/28/1995)  . PNA vac Low Risk Adult (2 of 2 - PPSV23) 12/29/2016  . TETANUS/TDAP  02/20/2020  . INFLUENZA VACCINE  Completed  . DEXA SCAN  Completed     Exam:  BP 110/70   Pulse 72   Temp 97.6 F (36.4 C) (Oral)   Wt 162 lb (73.5 kg)   BMI 31.64 kg/m  Gen: Well NAD HEENT: EOMI,  MMM  Lungs: Normal work of breathing. CTABL Heart: RRR no MRG Abd: NABS, Soft. Nondistended, Nontender Exts: Brisk capillary refill, warm and well perfused.  Psych: Alert and oriented flattened affect tearful at times. Normal speech and thought processes. No SI HI expressed.  Depression screen [facility] 2/9 09/07/2016 09/02/2016 08/11/2016 05/09/2011 03/31/2011  Decreased Interest 0 2 2 0 0  Down, Depressed, Hopeless 3 2 2  0 0  PHQ - 2 Score 3 4 4  0 0  Altered sleeping 1 3 3  - -  Tired, decreased energy 3 3 3  - -  Change in appetite 0 2 2 - -  Feeling bad or failure about yourself  1 2 2  - -  Trouble concentrating 0 2 2 - -  Moving slowly or fidgety/restless 0 1 1 - -    Suicidal thoughts 0 0 0 - -  PHQ-9 Score 8 17 17  - -  Difficult doing work/chores Somewhat difficult Very difficult Very difficult - -     No results found for this or any previous visit (from the past 72 hour(s)). No results found.    Assessment and Plan: 83 y.o. female with  Chronic pain: I recommend patient discuss her regimen with pain management provider. She may benefit from just immediate release oxycodone and no fentanyl patch.  Depression: Not well controlled. Add Wellbutrin. Recheck in about a month.  Decreased hearing: Patient may benefit from hearing aids. Refer to [facility] to see if there is anything we can help with.    Orders Placed This Encounter  Procedures  . Ambulatory referral to [facility]    Referral Priority:   Routine    Referral Type:   Consultation    Referral Reason:   Specialty Services Required    Number of Visits Requested:   1   Meds ordered this encounter  Medications  . buPROPion (WELLBUTRIN XL) 150 MG 24 hr tablet    Sig: Take 1 tablet (150 mg total) by mouth daily.    Dispense:  30 tablet    Refill:  1     Discussed warning signs or symptoms. Please see discharge instructions. Patient expresses understanding.

## 2016-09-15 ENCOUNTER — Telehealth: Payer: Self-pay

## 2016-09-15 NOTE — Telephone Encounter (Signed)
Marquee called and states Bupropion is making her feel dizzy. The dizziness started the 2 nd day after starting the medication. She would like to stop taking the Bupropion. Please advise.

## 2016-09-16 NOTE — Telephone Encounter (Signed)
I recommend she stop the medicine

## 2016-09-16 NOTE — Telephone Encounter (Signed)
Unable to reach patient.

## 2016-09-21 NOTE — Telephone Encounter (Signed)
Patient advised of recommendations.  

## 2016-09-23 DIAGNOSIS — M47816 Spondylosis without myelopathy or radiculopathy, lumbar region: Secondary | ICD-10-CM | POA: Diagnosis not present

## 2016-09-23 DIAGNOSIS — G4700 Insomnia, unspecified: Secondary | ICD-10-CM | POA: Diagnosis not present

## 2016-09-23 DIAGNOSIS — M069 Rheumatoid arthritis, unspecified: Secondary | ICD-10-CM | POA: Diagnosis not present

## 2016-09-23 DIAGNOSIS — G894 Chronic pain syndrome: Secondary | ICD-10-CM | POA: Diagnosis not present

## 2016-09-28 DIAGNOSIS — F410 Panic disorder [episodic paroxysmal anxiety] without agoraphobia: Secondary | ICD-10-CM | POA: Diagnosis not present

## 2016-09-28 DIAGNOSIS — F331 Major depressive disorder, recurrent, moderate: Secondary | ICD-10-CM | POA: Diagnosis not present

## 2016-09-28 DIAGNOSIS — F341 Dysthymic disorder: Secondary | ICD-10-CM | POA: Diagnosis not present

## 2016-10-03 ENCOUNTER — Other Ambulatory Visit: Payer: Self-pay

## 2016-10-03 NOTE — Patient Outreach (Signed)
[facility]) Care Management  10/03/2016  [name] 1932/12/06 [id number]  REFERRAL DATE: 08/11/16     REFERRAL SOURCE: primary MD    REFERRAL REASON: Hypertension, care coordination CONSENT: HIPAA verified with patient  PROVIDERS:  Dr. Lynne Leader - primary MD Agency - counselor Dr. Radford Pax - cardiology  SOCIAL: Patient lives with spouse. Patient states she and her husband both have health issues. Patient states she and her husband provide transportation for each other to doctor appointments  SUBJECTIVE: Telephone assessment call to patient. HIPAA verified with patient. Patient states the pain in her hips was better after her injection. Patient states the pain has now returned in both hips.  Patient states she will be following up with her doctor on 10/05/16 and will discuss this with him.  Patient states her primary MD does the injections for her pain as well as her pain management doctor.  Patient is assured that she will be able to have another injection for her hip pain.  Patient states she continues to take oxycodone for her pain. Patient reports her doctor discontinued her fentanyl patches because they were expensive and they were not helping with the pain. Patient states she was checking her blood pressures then lost her blood pressure monitor. Patient state she found the blood pressure monitor today. States her blood pressure today was 116/27. Patient states she has not received the EMMI education material from [facility].  RNCM will resend material. RNCM discussed low salt food choices with patient. Patient states she uses fresh, frozen, or no salt added canned vegetables.  Patient states she uses Mrs. Dash or other no salt type seasonings. Patient states she does not eat processed foods.  Patient states she has a follow up appointment with her primary MD on 10/05/16, a follow up appointment with her pain management doctor on 10/21/16, follow up appointment  with her counselor on 10/26/16, a follow up with her psychiatrist on 11/14/16 and a follow up with her cardiologist on 11/22/16.  Patient verbally agreed to next telephone outreach with [facility].  ASSESSMENT; ongoing hypertension disease management.  Patient not consistent with checking blood pressures. Appears to be adhering to low salt diet.   PLAN:  RNCM will resend EMMI education material on hypertension and low salt diet. RNCM will follow up with patient in April 2018.  Quinn Plowman RN,BSN,CCM [facility] Telephonic  [phone]

## 2016-10-05 ENCOUNTER — Ambulatory Visit (INDEPENDENT_AMBULATORY_CARE_PROVIDER_SITE_OTHER): Payer: Medicare Other | Admitting: Family Medicine

## 2016-10-05 ENCOUNTER — Ambulatory Visit: Payer: Self-pay | Admitting: Family Medicine

## 2016-10-05 ENCOUNTER — Encounter: Payer: Self-pay | Admitting: Family Medicine

## 2016-10-05 VITALS — BP 171/76 | HR 89 | Wt 162.0 lb

## 2016-10-05 DIAGNOSIS — M153 Secondary multiple arthritis: Secondary | ICD-10-CM | POA: Diagnosis not present

## 2016-10-05 DIAGNOSIS — M542 Cervicalgia: Secondary | ICD-10-CM | POA: Diagnosis not present

## 2016-10-05 DIAGNOSIS — M1712 Unilateral primary osteoarthritis, left knee: Secondary | ICD-10-CM | POA: Diagnosis not present

## 2016-10-05 DIAGNOSIS — M7062 Trochanteric bursitis, left hip: Secondary | ICD-10-CM | POA: Diagnosis not present

## 2016-10-05 NOTE — Patient Instructions (Addendum)
Thank you for coming in today. Return in early April  Follow up with Pain management.  Call or go to the ER if you develop a large red swollen joint with extreme pain or oozing puss.  Return sooner if needed.   We will continue to figure it out.    TENS UNIT: This is helpful for muscle pain and spasm.   Search and Purchase a TENS 7000 2nd edition at  www.tenspros.com or [email or web address] It should be less than $30.     TENS unit instructions: Do not shower or bathe with the unit on Turn the unit off before removing electrodes or batteries If the electrodes lose stickiness add a drop of water to the electrodes after they are disconnected from the unit and place on plastic sheet. If you continued to have difficulty, call the TENS unit company to purchase more electrodes. Do not apply lotion on the skin area prior to use. Make sure the skin is clean and dry as this will help prolong the life of the electrodes. After use, always check skin for unusual red areas, rash or other skin difficulties. If there are any skin problems, does not apply electrodes to the same area. Never remove the electrodes from the unit by pulling the wires. Do not use the TENS unit or electrodes other than as directed. Do not change electrode placement without consultating your therapist or physician. Keep 2 fingers with between each electrode. Wear time ratio is 2:1, on to off times.    For example on for 30 minutes off for 15 minutes and then on for 30 minutes off for 15 minutes

## 2016-10-05 NOTE — Progress Notes (Signed)
[name] is a 83 y.o. female who presents to [place]: [place] today for left hip pain, left knee pain, right-sided neck pain.  Left hip pain: Patient is returning left lateral hip pain. She's previously been evaluated for this and thought to have greater trochanter pain syndrome. She's had injections before which worked well. She notes she's tried physical therapy and not found to be very helpful. She notes left lateral hip pain moderate to severe times. Pain is worse with activity and when laying on her left side. She denies significant radiating pain.  Left knee pain: Patient has moderate to severe left knee pain. This is occasionally associated with effusions. She denies any new injury. She's been found to have DJD previously.  Right lateral neck pain: Symptoms chronically ongoing moderately. However patient last week is worsening lateral neck pain. She denies any injury radiating pain weakness or numbness. She's been using over-the-counter topical medicines as well as her chronic opiates prescribed by pain management. She is a follow-up appointment with pain management in a few weeks.    Past Medical History:  Diagnosis Date  . Anxiety   . Arthritis    osteoarthritis  . Asthma   . C. difficile diarrhea   . Candida esophagitis ([place])   . Chronic sinusitis   . Depression   . Depression   . Diverticulosis   . DJD (degenerative joint disease)     L5 compression fracture  . Edema extremities   . Fibromyalgia   . GERD (gastroesophageal reflux disease)   . Gout   . Hyperlipidemia   . Hypertension   . Osteoarthritis   . Osteopenia   . Panic disorder   . Renal failure, unspecified   . Spinal stenosis of lumbar region   . Tubular adenoma of colon    Past Surgical History:  Procedure Laterality Date  . ADENOIDECTOMY    . CARPAL TUNNEL RELEASE  2007, 2009   Bilateral  . CATARACT EXTRACTION, BILATERAL    . CHOANAL ADENIODECTOMY    . DIAGNOSTIC LARYNGOSCOPY N/A 11/05/2015   Procedure: DIAGNOSTIC LARYNGOSCOPY AND ESOPHAGOSCOPY;  Surgeon: Rozetta Nunnery, MD;  Location: [facility];  Service: ENT;  Laterality: N/A;  . ganglion cyct removal on fingers     right  . ganglion cyst  wrist    . KNEE ARTHROSCOPY     right  . NASAL SINUS SURGERY    . SHOULDER SURGERY  2004, 07/30/10, 01/2011   after her right humeral neck fracture, revision hemiarthroplasty 2004 for persistent pain, revision reverse arthroplasty December 2011 for persistent pain, incision and drainage with poly-exchange 01/26/2011 for possible prosthetic joint infection.  . TONSILLECTOMY AND ADENOIDECTOMY    . TOTAL ABDOMINAL HYSTERECTOMY    . TOTAL HIP ARTHROPLASTY  2004   right  . TOTAL KNEE ARTHROPLASTY  2006   right  . TOTAL SHOULDER REPLACEMENT  2002   right   Social History  Substance Use Topics  . Smoking status: Never Smoker  . Smokeless tobacco: Never Used  . Alcohol use No   family history includes Bladder Cancer in her father; Depression in her son; Diabetes in her mother; Hypertension in her mother.  ROS as above:  Medications: Current Outpatient Prescriptions  Medication Sig Dispense Refill  . ALPRAZolam (XANAX) 0.5 MG tablet Take 0.5 mg by mouth 4 (four) times daily as needed for anxiety.     . AMBULATORY NON FORMULARY MEDICATION [place].  Dx: Rheumatoid Arthritis.  Use daily as needed for ambulation. 1 Units 0  . amLODipine (NORVASC) 10 MG tablet Take 1 tablet (10 mg total) by mouth daily. 90 tablet 2  . Calcium Carbonate-Vitamin D (CALCIUM 600+D) 600-400 MG-UNIT per tablet Take 1 tablet by mouth 2 (two) times daily.    . diclofenac sodium (VOLTAREN) 1 % GEL Apply 1 application topically 4 (four) times daily.  0  . diphenhydrAMINE (BENADRYL) 25 MG tablet Take 25 mg by mouth every 6 (six) hours as needed. For allergies    . EPINEPHrine (ADRENACLICK) 0.3  ST/4.1 mL IJ SOAJ injection Inject 0.3 mLs (0.3 mg total) into the muscle once. 1 Device 1  . escitalopram (LEXAPRO) 20 MG tablet Take 20 mg by mouth daily.    . fentaNYL (DURAGESIC - DOSED MCG/HR) 12 MCG/HR Place 12.5 mcg onto the skin every 3 (three) days.    . hydrochlorothiazide (HYDRODIURIL) 25 MG tablet One tablet by mouth every morning for blood pressure control. 30 tablet 2  . ipratropium (ATROVENT) 0.06 % nasal spray Place 2 sprays into both nostrils 3 (three) times daily as needed for rhinitis. 15 mL 12  . montelukast (SINGULAIR) 10 MG tablet take 1 tablet by mouth at bedtime 30 tablet 3  . ondansetron (ZOFRAN) 4 MG tablet Take 1-2 tablets (4-8 mg total) by mouth every 8 (eight) hours as needed for nausea or vomiting. 30 tablet 1  . oxyCODONE (OXY IR/ROXICODONE) 5 MG immediate release tablet take 1 tablet by mouth three times a day as needed for pain  0  . potassium chloride SA (K-DUR,KLOR-CON) 20 MEQ tablet take 1 tablet by mouth once daily 30 tablet 8  . simvastatin (ZOCOR) 10 MG tablet Take 1 tablet (10 mg total) by mouth daily. 90 tablet 1  . zoledronic acid (RECLAST) 5 MG/100ML SOLN Inject 5 mg into the vein. Once yearly. In June     No current facility-administered medications for this visit.    Allergies  Allergen Reactions  . Bee Venom Anaphylaxis    Has epi-pen  . Ceftin [Cefuroxime Axetil] Anaphylaxis  . Ciprofloxacin Anaphylaxis and Palpitations  . Ephedrine Other (See Comments) and Palpitations    "knocks me out"  . Sulfonamide Derivatives Anaphylaxis  . Telithromycin Anaphylaxis    Reaction: also blurred vision   . Valsartan Anaphylaxis  . Verapamil Anaphylaxis  . Venlafaxine Other (See Comments)    Insomnia, panic  . Beta Adrenergic Blockers     bradycardia  . Cephalosporins     Allergic Reaction  . Clindamycin/Lincomycin     Dry skin and itching/ Cdiff  . Cymbalta [Duloxetine Hcl] Other (See Comments)    Reaction:Confusion and " I fall down"  . Doxazosin      "blurred vision and irritable"  . Gabapentin Other (See Comments)    Reaction: headache  . Hydralazine     HA, Diarrhea  . Lamotrigine     Patient unsure at this time  . Mirtazapine Other (See Comments)    unknown  . Morphine Other (See Comments)    Medication has no effect with pain  . Oxycodone-Acetaminophen Hives  . Pregabalin Swelling  . Prochlorperazine Edisylate   . Pseudoephedrine Other (See Comments)    Reaction: hyperventilates and blacks out  . Relafen [Nabumetone] Swelling  . Zoloft [Sertraline] Other (See Comments)    "nervous/jittery"  . Doxycycline Hives    Health Maintenance Health Maintenance  Topic Date Due  . PNA vac Low Risk Adult (2 of 2 -  PPSV23) 12/29/2016  . TETANUS/TDAP  02/20/2020  . INFLUENZA VACCINE  Completed  . DEXA SCAN  Completed     Exam:  BP (!) 171/76   Pulse 89   Wt 162 lb (73.5 kg)   BMI 31.64 kg/m  Gen: Well NAD HEENT: EOMI,  MMM Lungs: Normal work of breathing. CTABL Heart: RRR no MRG Abd: NABS, Soft. Nondistended, Nontender Exts: Brisk capillary refill, warm and well perfused.  Left hip: Normal-appearing. Tender to palpation posterior margin of the greater trochanter. Hip abduction strength is diminished 4/5.  Left knee: No significant effusion. Range of motion 5-110 with crepitations on extension. Tender palpation at the medial and lateral joint lines. Stable ligaments exam.  C-spine: Nontender to spinal midline. Patient has thoracic kyphosis and concordant cervical extension. Tender palpation right lateral neck at the trapezius muscle and some clot a mastoid muscles. Decreased motion. Upper history strength is intact.  Procedure: Real-time Ultrasound Guided Injection of left knee  Device: GE Logiq E  Images permanently stored and available for review in the ultrasound unit. Verbal informed consent obtained. Discussed risks and benefits of procedure. Warned about infection bleeding damage to structures skin  hypopigmentation and fat atrophy among others. Patient expresses understanding and agreement Time-out conducted.  Noted no overlying erythema, induration, or other signs of local infection.  Skin prepped in a sterile fashion.  Local anesthesia: Topical Ethyl chloride.  With sterile technique and under real time ultrasound guidance: 40 mg of Kenalog and 4 mL of Marcaine injected easily.  Completed without difficulty  Pain immediately resolved suggesting accurate placement of the medication.  Advised to call if fevers/chills, erythema, induration, drainage, or persistent bleeding.  Images permanently stored and available for review in the ultrasound unit.  Impression: Technically successful ultrasound guided injection.  Hip greater trochanteric injection: left Consent obtained and timeout performed. Area of maximum tenderness palpated and identified. Skin cleaned with alcohol, cold spray applied. A 25 gauge needle was used to access the greater trochanteric bursa. 40 mg of Depo-Medrol, and 4 mL of Marcaine were used to inject the trochanteric bursa. Patient tolerated the procedure well.   No results found for this or any previous visit (from the past 72 hour(s)). No results found.    Assessment and Plan: 83 y.o. female with  Left lateral hip pain likely greater trochanter pain syndrome. Patient is done well with injections. We will repeat injection today along with a home therapy program. Recheck in 1 month. Continue pain management.  Left knee pain DJD. Injected today. Return as needed.  Right lateral neck pain.  Likely exacerbation of existing DDD. Muscle spasm may be a component. Recommend heating pad and TENS unit. Follow-up with pain management.  Blood pressure elevated today. We'll recheck in one month. Typically well-controlled.   No orders of the defined types were placed in this encounter.  No orders of the defined types were placed in this  encounter.    Discussed warning signs or symptoms. Please see discharge instructions. Patient expresses understanding.

## 2016-10-07 ENCOUNTER — Ambulatory Visit: Payer: Self-pay | Admitting: Family Medicine

## 2016-10-07 DIAGNOSIS — G4700 Insomnia, unspecified: Secondary | ICD-10-CM | POA: Diagnosis not present

## 2016-10-07 DIAGNOSIS — M47816 Spondylosis without myelopathy or radiculopathy, lumbar region: Secondary | ICD-10-CM | POA: Diagnosis not present

## 2016-10-07 DIAGNOSIS — G894 Chronic pain syndrome: Secondary | ICD-10-CM | POA: Diagnosis not present

## 2016-10-07 DIAGNOSIS — M069 Rheumatoid arthritis, unspecified: Secondary | ICD-10-CM | POA: Diagnosis not present

## 2016-10-21 DIAGNOSIS — M069 Rheumatoid arthritis, unspecified: Secondary | ICD-10-CM | POA: Diagnosis not present

## 2016-10-21 DIAGNOSIS — G894 Chronic pain syndrome: Secondary | ICD-10-CM | POA: Diagnosis not present

## 2016-10-21 DIAGNOSIS — M47816 Spondylosis without myelopathy or radiculopathy, lumbar region: Secondary | ICD-10-CM | POA: Diagnosis not present

## 2016-10-21 DIAGNOSIS — G4700 Insomnia, unspecified: Secondary | ICD-10-CM | POA: Diagnosis not present

## 2016-10-27 ENCOUNTER — Other Ambulatory Visit: Payer: Self-pay | Admitting: Family Medicine

## 2016-11-02 ENCOUNTER — Ambulatory Visit (INDEPENDENT_AMBULATORY_CARE_PROVIDER_SITE_OTHER): Payer: Medicare Other | Admitting: Family Medicine

## 2016-11-02 ENCOUNTER — Ambulatory Visit (INDEPENDENT_AMBULATORY_CARE_PROVIDER_SITE_OTHER): Payer: Medicare Other

## 2016-11-02 VITALS — BP 139/80 | HR 87 | Wt 163.0 lb

## 2016-11-02 DIAGNOSIS — M4856XA Collapsed vertebra, not elsewhere classified, lumbar region, initial encounter for fracture: Secondary | ICD-10-CM | POA: Diagnosis not present

## 2016-11-02 DIAGNOSIS — M545 Low back pain, unspecified: Secondary | ICD-10-CM

## 2016-11-02 DIAGNOSIS — M4316 Spondylolisthesis, lumbar region: Secondary | ICD-10-CM | POA: Diagnosis not present

## 2016-11-02 NOTE — Patient Instructions (Addendum)
Thank you for coming in today. We will contact you with results from xray.  Follow up with pain management.  Use the back brace. If it helps use it.  Recheck in 1-2 months or sooner if needed.

## 2016-11-02 NOTE — Progress Notes (Signed)
NEENAH CANTER is a 83 y.o. female who presents to [facility] today for follow-up hip pain knee pain and back pain. Patient was seen last month for hip and knee pain and received greater trochanter injection and knee injection. She notes a few days after the hip and knee injection she developed central midline back pain. She presented to her pain management clinic where she was thought to perhaps have a vertebral compression fracture. No imaging was done and she has not heard back. She notes continued midline low back pain without radiating pain weakness or numbness. She denies any falls or injury. She notes that hip and knee pain are slightly improved. Overall she continues to complain of severe chronic pain that is managed with pain management. She has a relevant past medical history of an L5 compression fracture.   Past Medical History:  Diagnosis Date  . Anxiety   . Arthritis    osteoarthritis  . Asthma   . C. difficile diarrhea   . Candida esophagitis ([place])   . Chronic sinusitis   . Depression   . Depression   . Diverticulosis   . DJD (degenerative joint disease)     L5 compression fracture  . Edema extremities   . Fibromyalgia   . GERD (gastroesophageal reflux disease)   . Gout   . Hyperlipidemia   . Hypertension   . Osteoarthritis   . Osteopenia   . Panic disorder   . Renal failure, unspecified   . Spinal stenosis of lumbar region   . Tubular adenoma of colon    Past Surgical History:  Procedure Laterality Date  . ADENOIDECTOMY    . CARPAL TUNNEL RELEASE  2007, 2009   Bilateral  . CATARACT EXTRACTION, BILATERAL    . CHOANAL ADENIODECTOMY    . DIAGNOSTIC LARYNGOSCOPY N/A 11/05/2015   Procedure: DIAGNOSTIC LARYNGOSCOPY AND ESOPHAGOSCOPY;  Surgeon: Rozetta Nunnery, MD;  Location: [facility];  Service: ENT;  Laterality: N/A;  . ganglion cyct removal  on fingers     right  . ganglion cyst  wrist    . KNEE ARTHROSCOPY     right  . NASAL SINUS SURGERY    . SHOULDER SURGERY  2004, 07/30/10, 01/2011   after her right humeral neck fracture, revision hemiarthroplasty 2004 for persistent pain, revision reverse arthroplasty December 2011 for persistent pain, incision and drainage with poly-exchange 01/26/2011 for possible prosthetic joint infection.  . TONSILLECTOMY AND ADENOIDECTOMY    . TOTAL ABDOMINAL HYSTERECTOMY    . TOTAL HIP ARTHROPLASTY  2004   right  . TOTAL KNEE ARTHROPLASTY  2006   right  . TOTAL SHOULDER REPLACEMENT  2002   right   Social History  Substance Use Topics  . Smoking status: Never Smoker  . Smokeless tobacco: Never Used  . Alcohol use No   family history includes Bladder Cancer in her father; Depression in her son; Diabetes in her mother; Hypertension in her mother.  ROS as above:  Medications: Current Outpatient Prescriptions  Medication Sig Dispense Refill  . ALPRAZolam (XANAX) 0.5 MG tablet Take 0.5 mg by mouth 4 (four) times daily as needed for anxiety.     . AMBULATORY NON FORMULARY MEDICATION [place].  Dx: Rheumatoid Arthritis.  Use daily as needed for ambulation. 1 Units 0  . amLODipine (NORVASC) 10 MG tablet Take 1 tablet (10 mg total) by mouth daily. 90 tablet 2  . Calcium Carbonate-Vitamin D (CALCIUM  600+D) 600-400 MG-UNIT per tablet Take 1 tablet by mouth 2 (two) times daily.    . diclofenac sodium (VOLTAREN) 1 % GEL Apply 1 application topically 4 (four) times daily.  0  . diphenhydrAMINE (BENADRYL) 25 MG tablet Take 25 mg by mouth every 6 (six) hours as needed. For allergies    . EPINEPHrine (ADRENACLICK) 0.3 HW/2.9 mL IJ SOAJ injection Inject 0.3 mLs (0.3 mg total) into the muscle once. 1 Device 1  . escitalopram (LEXAPRO) 20 MG tablet Take 20 mg by mouth daily.    . fentaNYL (DURAGESIC - DOSED MCG/HR) 12 MCG/HR Place 12.5 mcg onto the skin every 3 (three) days.    . hydrochlorothiazide  (HYDRODIURIL) 25 MG tablet One tablet by mouth every morning for blood pressure control. 30 tablet 2  . hydrochlorothiazide (HYDRODIURIL) 25 MG tablet take 1 tablet by mouth every morning for blood pressure 90 tablet 1  . ipratropium (ATROVENT) 0.06 % nasal spray Place 2 sprays into both nostrils 3 (three) times daily as needed for rhinitis. 15 mL 12  . montelukast (SINGULAIR) 10 MG tablet take 1 tablet by mouth at bedtime 30 tablet 3  . ondansetron (ZOFRAN) 4 MG tablet Take 1-2 tablets (4-8 mg total) by mouth every 8 (eight) hours as needed for nausea or vomiting. 30 tablet 1  . oxyCODONE (OXY IR/ROXICODONE) 5 MG immediate release tablet take 1 tablet by mouth three times a day as needed for pain  0  . potassium chloride SA (K-DUR,KLOR-CON) 20 MEQ tablet take 1 tablet by mouth once daily 30 tablet 8  . simvastatin (ZOCOR) 10 MG tablet Take 1 tablet (10 mg total) by mouth daily. 90 tablet 1  . zoledronic acid (RECLAST) 5 MG/100ML SOLN Inject 5 mg into the vein. Once yearly. In June     No current facility-administered medications for this visit.    Allergies  Allergen Reactions  . Bee Venom Anaphylaxis    Has epi-pen  . Ceftin [Cefuroxime Axetil] Anaphylaxis  . Ciprofloxacin Anaphylaxis and Palpitations  . Ephedrine Other (See Comments) and Palpitations    "knocks me out"  . Sulfonamide Derivatives Anaphylaxis  . Telithromycin Anaphylaxis    Reaction: also blurred vision   . Valsartan Anaphylaxis  . Verapamil Anaphylaxis  . Venlafaxine Other (See Comments)    Insomnia, panic  . Beta Adrenergic Blockers     bradycardia  . Cephalosporins     Allergic Reaction  . Clindamycin/Lincomycin     Dry skin and itching/ Cdiff  . Cymbalta [Duloxetine Hcl] Other (See Comments)    Reaction:Confusion and " I fall down"  . Doxazosin     "blurred vision and irritable"  . Gabapentin Other (See Comments)    Reaction: headache  . Hydralazine     HA, Diarrhea  . Lamotrigine     Patient unsure at  this time  . Mirtazapine Other (See Comments)    unknown  . Morphine Other (See Comments)    Medication has no effect with pain  . Oxycodone-Acetaminophen Hives  . Pregabalin Swelling  . Prochlorperazine Edisylate   . Pseudoephedrine Other (See Comments)    Reaction: hyperventilates and blacks out  . Relafen [Nabumetone] Swelling  . Zoloft [Sertraline] Other (See Comments)    "nervous/jittery"  . Doxycycline Hives    Health Maintenance Health Maintenance  Topic Date Due  . PNA vac Low Risk Adult (2 of 2 - PPSV23) 12/29/2016  . INFLUENZA VACCINE  03/01/2017  . TETANUS/TDAP  02/20/2020  . DEXA SCAN  Completed     Exam:  BP 139/80   Pulse 87   Wt 163 lb (73.9 kg)   BMI 31.83 kg/m  Gen: Well NAD HEENT: EOMI,  MMM Lungs: Normal work of breathing. CTABL Heart: RRR no MRG Abd: NABS, Soft. Nondistended, Nontender Exts: Brisk capillary refill, warm and well perfused.  MSK: Thoracic kyphosis present. Tender palpation midline L-spine. Tender palpation right lumbar paraspinal muscles as well. Patient ambulates using a Jac[place].      No results found for this or any previous visit (from the past 72 hour(s)). Dg Lumbar Spine Complete  Result Date: 11/02/2016 CLINICAL DATA:  Lumbago with left-sided radicular symptoms EXAM: LUMBAR SPINE - COMPLETE 4+ VIEW COMPARISON:  Lumbar radiographs Dec 09, 2011 and lumbar CT January 07, 2012 FINDINGS: Frontal, lateral, spot lumbosacral lateral, and bilateral oblique views were obtained. There are 5 non-rib-bearing lumbar type vertebral bodies. There is thoracolumbar levoscoliosis. There is chronic anterior wedging of the L5 vertebral body which appears stable. No evident new fracture. There is stable approximately 6 mm of anterolisthesis of L4 on L5. No other spondylolisthesis is evident. There is moderate disc space narrowing at L2-3, L3-4, L4-5, and L5-S1. No erosive change. There is facet hypertrophy at all levels bilaterally. There is a total hip  replacement on the right IMPRESSION: Stable anterior wedging of the L5 vertebral body. No new fracture. Stable spondylolisthesis at L4-5. No new spondylolisthesis. Multilevel arthropathy with levoscoliosis. Status post total hip replacement on the right. Electronically Signed   By: Lowella Grip III M.D.   On: 11/02/2016 10:54      Assessment and Plan: 83 y.o. female with back pain. No obvious acute compression fracture. Plan for watchful waiting recheck in a month or 2. Follow-up with pain management   Orders Placed This Encounter  Procedures  . DG Lumbar Spine Complete    Standing Status:   Future    Number of Occurrences:   1    Standing Expiration Date:   01/02/2018    Order Specific Question:   Reason for Exam (SYMPTOM  OR DIAGNOSIS REQUIRED)    Answer:   eval ? compression fx    Order Specific Question:   Preferred imaging location?    Answer:   [facility]    Order Specific Question:   Radiology Contrast Protocol - do NOT remove file path    Answer:   [email or web address]   No orders of the defined types were placed in this encounter.    Discussed warning signs or symptoms. Please see discharge instructions. Patient expresses understanding.

## 2016-11-03 ENCOUNTER — Ambulatory Visit: Payer: Self-pay

## 2016-11-04 ENCOUNTER — Encounter: Payer: Self-pay | Admitting: Cardiology

## 2016-11-04 ENCOUNTER — Other Ambulatory Visit: Payer: Self-pay

## 2016-11-04 NOTE — Patient Outreach (Signed)
[facility]) Care Management  [place]  11/04/2016   [name] [birth year] [id number]  Monthly assessment Subjective: Telephone call to patient regarding assessment follow up.  Patient states she is not doing the best. Patient states she continues to have back pain that has been pretty bad. Patient states she is taking her pain medication as prescribed but it does not seem to be helping much. Patient states her current pain level is a 4-5 on a scale of 1-10 with 10 being worst pain. Patient states she took her oxycodone within the past hour. Patient states the chronic pain has become  Very aggravating for her.  Patient states her Dr. Velna Hatchet, orthopedic was going to order a MRI because she thought patient could possibly have another compression fracture. Patient states the MRI was never ordered. Patient states she saw her primary MD last week and he did a xray of her back. Patient states xray did not show fracture. Patient reports she is scheduled to follow up with Dr. Velna Hatchet within 2-3 weeks and will talk with her further regarding the MRI. Patient reports Dr. Velna Hatchet did order a tens unit for her that she will pick up on 11/15/16.  Patient states her follow up appointment date with with Dr Velna Hatchet is 11/18/16.  Patient denies any falls.  She continues to use her [place]aka when outside of the house. Patient states she has help from her husband and daughter.  Patient denies constipation from pain medications due to taking miralax daily.   Objective: n/a  Encounter Medications:  Outpatient Encounter Prescriptions as of 11/04/2016  Medication Sig Note  . ALPRAZolam (XANAX) 0.5 MG tablet Take 0.5 mg by mouth 4 (four) times daily as needed for anxiety.  08/24/2016: Patient reports she usually takes morning and at bedtime.    . AMBULATORY NON FORMULARY MEDICATION [place].  Dx: Rheumatoid Arthritis.  Use daily as needed for ambulation.   [name] amLODipine (NORVASC) 10 MG tablet Take 1  tablet (10 mg total) by mouth daily.   . Calcium Carbonate-Vitamin D (CALCIUM 600+D) 600-400 MG-UNIT per tablet Take 1 tablet by mouth 2 (two) times daily.   . diclofenac sodium (VOLTAREN) 1 % GEL Apply 1 application topically 4 (four) times daily. 11/16/2015: Received from: External Pharmacy Received Sig:   . diphenhydrAMINE (BENADRYL) 25 MG tablet Take 25 mg by mouth every 6 (six) hours as needed. For allergies   . EPINEPHrine (ADRENACLICK) 0.3 FO/2.7 mL IJ SOAJ injection Inject 0.3 mLs (0.3 mg total) into the muscle once.   . escitalopram (LEXAPRO) 20 MG tablet Take 20 mg by mouth daily.   . hydrALAZINE (APRESOLINE) 25 MG tablet Take 25 mg by mouth. Patient states her doctor instructed her to take 1-2 tabs at bedtime.   . hydrochlorothiazide (HYDRODIURIL) 25 MG tablet One tablet by mouth every morning for blood pressure control.   [name] ipratropium (ATROVENT) 0.06 % nasal spray Place 2 sprays into both nostrils 3 (three) times daily as needed for rhinitis.   [name] montelukast (SINGULAIR) 10 MG tablet take 1 tablet by mouth at bedtime   . ondansetron (ZOFRAN) 4 MG tablet Take 1-2 tablets (4-8 mg total) by mouth every 8 (eight) hours as needed for nausea or vomiting.   [name] oxyCODONE (OXY IR/ROXICODONE) 5 MG immediate release tablet take 1 tablet by mouth three times a day as needed for pain 04/19/2016: Received from: External Pharmacy  . potassium chloride SA (K-DUR,KLOR-CON) 20 MEQ tablet take 1 tablet by mouth  once daily   . simvastatin (ZOCOR) 10 MG tablet Take 1 tablet (10 mg total) by mouth daily.   . zoledronic acid (RECLAST) 5 MG/100ML SOLN Inject 5 mg into the vein. Once yearly. In June   . fentaNYL (DURAGESIC - DOSED MCG/HR) 12 MCG/HR Place 12.5 mcg onto the skin every 3 (three) days. 10/03/2016: Patient states her doctor discontinued  . hydrochlorothiazide (HYDRODIURIL) 25 MG tablet take 1 tablet by mouth every morning for blood pressure    No facility-administered encounter medications on file as of  11/04/2016.     Functional Status:  In your present state of health, do you have any difficulty performing the following activities: 09/07/2016 09/02/2016  Hearing? [name]  Vision? N N  Difficulty concentrating or making decisions? N N  Walking or climbing stairs? Y Y  Dressing or bathing? N N  Doing errands, shopping? N N  Preparing Food and eating ? Y Y  Using the Toilet? N N  In the past six months, have you accidently leaked urine? Y Y  Do you have problems with loss of bowel control? N N  Managing your Medications? N N  Managing your Finances? N Y  Housekeeping or managing your Housekeeping? [name]  Some recent data might be hidden    Fall/Depression Screening: PHQ 2/9 Scores 09/07/2016 09/02/2016 08/11/2016 07/20/2016 05/09/2011 03/31/2011  PHQ - 2 Score 3 4 4  - 0 0  PHQ- 9 Score 8 17 17  - - -  Exception Documentation - - - Patient refusal - -   Fall Risk  09/07/2016 09/02/2016 07/20/2016  Falls in the past year? No No Yes  Number falls in past yr: - - 2 or more  Injury with Fall? - - No  Risk Factor Category  - - High Fall Risk  Risk for fall due to : - - History of fall(s)  Follow up - - Education provided;Falls prevention discussed   Assessment: Patient to receive tens unit on 11/15/16. RNCM to continue follow up with patient to assess pain status with use of tens unit.   Plan: RNCM will follow up with patient within 1 month.

## 2016-11-11 ENCOUNTER — Ambulatory Visit: Payer: Self-pay | Admitting: Sports Medicine

## 2016-11-11 ENCOUNTER — Encounter: Payer: Self-pay | Admitting: Sports Medicine

## 2016-11-11 ENCOUNTER — Ambulatory Visit (INDEPENDENT_AMBULATORY_CARE_PROVIDER_SITE_OTHER): Payer: Medicare Other | Admitting: Sports Medicine

## 2016-11-11 ENCOUNTER — Ambulatory Visit (INDEPENDENT_AMBULATORY_CARE_PROVIDER_SITE_OTHER): Payer: Medicare Other

## 2016-11-11 DIAGNOSIS — M25461 Effusion, right knee: Secondary | ICD-10-CM

## 2016-11-11 DIAGNOSIS — Z96651 Presence of right artificial knee joint: Secondary | ICD-10-CM

## 2016-11-11 DIAGNOSIS — M25511 Pain in right shoulder: Secondary | ICD-10-CM | POA: Diagnosis not present

## 2016-11-11 DIAGNOSIS — M25561 Pain in right knee: Secondary | ICD-10-CM | POA: Diagnosis not present

## 2016-11-11 DIAGNOSIS — Z96611 Presence of right artificial shoulder joint: Secondary | ICD-10-CM | POA: Diagnosis not present

## 2016-11-11 NOTE — Assessment & Plan Note (Signed)
History of arthroplasty, increasing pain, swelling, x-rays, injection performed.

## 2016-11-11 NOTE — Assessment & Plan Note (Signed)
With some warmth, aspiration was not purulent so this is likely a synovitis, it was injected, I am going to send the fluid off for culture and cell counts. Repeating x-rays.

## 2016-11-11 NOTE — Progress Notes (Signed)
Subjective:    CC: Multiple issues  HPI: Right shoulder pain: Patient tells me she is post total arthroplasty, with multiple revisions, persistent pain without fevers, warmth, trauma. Localized over the entire shoulder joint with significant loss of range of motion.  Right knee pain: History of arthroplasty in the distant past, with new-onset warmth and pain with loss of range of motion.  Past medical history:  Negative.  See flowsheet/record as well for more information.  Surgical history: Negative.  See flowsheet/record as well for more information.  Family history: Negative.  See flowsheet/record as well for more information.  Social history: Negative.  See flowsheet/record as well for more information.  Allergies, and medications have been entered into the medical record, reviewed, and no changes needed.   Review of Systems: No fevers, chills, night sweats, weight loss, chest pain, or shortness of breath.   Objective:    General: Well Developed, well nourished, and in no acute distress.  Neuro: Alert and oriented x3, extra-ocular muscles intact, sensation grossly intact.  HEENT: Normocephalic, atraumatic, pupils equal round reactive to light, neck supple, no masses, no lymphadenopathy, thyroid nonpalpable.  Skin: Warm and dry, no rashes. Cardiac: Regular rate and rhythm, no murmurs rubs or gallops, no lower extremity edema.  Respiratory: Clear to auscultation bilaterally. Not using accessory muscles, speaking in full sentences. Right Knee: Well-healed arthroplasty scar, minimally warm to the touch. No erythema. ROM normal in flexion and extension and lower leg rotation. Ligaments with solid consistent endpoints including ACL, PCL, LCL, MCL. Negative Mcmurray's and provocative meniscal tests. Non painful patellar compression. Patellar and quadriceps tendons unremarkable. Hamstring and quadriceps strength is normal. Right shoulder: 10 of external rotation, pain with most  movements, no warmth or erythema.  Procedure: Real-time Ultrasound Guided aspiration/Injection of right knee Device: GE Logiq E  Verbal informed consent obtained.  Time-out conducted.  Noted no overlying erythema, induration, or other signs of local infection.  Skin prepped in a sterile fashion.  Local anesthesia: Topical Ethyl chloride.  With sterile technique and under real time ultrasound guidance:  Aspirated a small pocket of fluid, only minimally cloudy straw-colored fluid was aspirated not consistent with a septic joint, I then injected 1 mL Kenalog 40, 2 mL lidocaine, 2 mL bupivacaine. Completed without difficulty  Pain immediately resolved suggesting accurate placement of the medication.  Advised to call if fevers/chills, erythema, induration, drainage, or persistent bleeding.  Images permanently stored and available for review in the ultrasound unit.  Impression: Technically successful ultrasound guided injection.  Procedure: Real-time Ultrasound Guided Injection of right shoulder Device: GE Logiq E  Verbal informed consent obtained.  Time-out conducted.  Noted no overlying erythema, induration, or other signs of local infection.  Skin prepped in a sterile fashion.  Local anesthesia: Topical Ethyl chloride.  With sterile technique and under real time ultrasound guidance:  Spinal needle advanced into the glenohumeral joint, 1 mL Kenalog 40, 2 mL lidocaine, 2 mL bupivacaine injected easily. Completed without difficulty  Pain immediately resolved suggesting accurate placement of the medication.  Advised to call if fevers/chills, erythema, induration, drainage, or persistent bleeding.  Images permanently stored and available for review in the ultrasound unit.  Impression: Technically successful ultrasound guided injection.  Impression and Recommendations:    S/P total knee arthroplasty, right With some warmth, aspiration was not purulent so this is likely a synovitis, it was  injected, I am going to send the fluid off for culture and cell counts. Repeating x-rays.  Status post total shoulder arthroplasty,  right History of arthroplasty, increasing pain, swelling, x-rays, injection performed.

## 2016-11-12 LAB — SYNOVIAL CELL COUNT + DIFF, W/ CRYSTALS
Basophils, %: 0 %
Eosinophils-Synovial: 0 % (ref 0–2)
Lymphocytes-Synovial Fld: 44 % (ref 0–74)
Monocyte/Macrophage: 4 % (ref 0–69)
Neutrophil, Synovial: 52 % — ABNORMAL HIGH (ref 0–24)
Synoviocytes, %: 0 % (ref 0–15)
WBC, Synovial: 1630 {cells}/uL — ABNORMAL HIGH (ref <150)

## 2016-11-16 LAB — BODY FLUID CULTURE
Gram Stain: NONE SEEN
Gram Stain: NONE SEEN
Organism ID, Bacteria: NO GROWTH

## 2016-11-21 DIAGNOSIS — M069 Rheumatoid arthritis, unspecified: Secondary | ICD-10-CM | POA: Diagnosis not present

## 2016-11-21 DIAGNOSIS — M47816 Spondylosis without myelopathy or radiculopathy, lumbar region: Secondary | ICD-10-CM | POA: Diagnosis not present

## 2016-11-21 DIAGNOSIS — G4700 Insomnia, unspecified: Secondary | ICD-10-CM | POA: Diagnosis not present

## 2016-11-21 DIAGNOSIS — G894 Chronic pain syndrome: Secondary | ICD-10-CM | POA: Diagnosis not present

## 2016-11-22 ENCOUNTER — Ambulatory Visit: Payer: Self-pay | Admitting: Cardiology

## 2016-11-22 ENCOUNTER — Other Ambulatory Visit: Payer: Self-pay | Admitting: Family Medicine

## 2016-11-22 ENCOUNTER — Other Ambulatory Visit: Payer: Self-pay | Admitting: Cardiology

## 2016-11-22 ENCOUNTER — Ambulatory Visit: Payer: Self-pay

## 2016-11-23 ENCOUNTER — Encounter: Payer: Self-pay | Admitting: *Deleted

## 2016-11-24 ENCOUNTER — Other Ambulatory Visit: Payer: Self-pay

## 2016-11-24 NOTE — Patient Outreach (Signed)
[name]) Care Management  11/24/2016  [name] Jan 29, 1933 [id number]   REFERRAL DATE: 08/11/16 REFERRAL SOURCE: primary MD  REFERRAL REASON: Hypertension, care coordination CONSENT: HIPAA verified with patient  PROVIDERS:  Dr. Lynne Leader - primary MD [place] - counselor Dr. Radford Pax - cardiology  SOCIAL: Patient lives with spouse. Patient states she and her husband both have health issues. Patient states she and her husband provide transportation for each other to doctor appointments  Call to patient for telephone assessment. HIPAA verified with patient.  CHRONIC PAIN: Patient states her pain in her back has gotten better. Patient states she has had an xray and no compression fractures were identified. Patient states she is having ongoing pain in her right shoulder and knee. Patient states the pain is due to bone growing in these areas and there is nothing the doctors can do about it. Patient states her pain management doctor increased her oxycodone from 5 mg 3 times a day as needed to 10 mg 4 times per day as needed. Patient reports she only takes it 3 times per day. Patient states the doctor adjusted this medication on 11/18/16 when she was seen in the office. Patient states she is taking all of her other medications as prescribed. Patient states she also uses ice packs to her shoulder for pain relief. Patient reports she was given the name of a tens unit representative, [name] by her pain management doctor. Patient states she has been having difficulty reaching her. Patient states the pain management office has also attempted to reach her as well. RNCM requested name and contact number of TENS unit representative to attempt contact. Contact number for TENS unit representative [phone].  Patient states her husband would be able to drive her to appointment to me with TENS unit representative. Patient states she recently saw Dr. Darene [name] on 11/11/16   for injections to her right shoulder and right knee.  Patient reports she has constipation due to taking the opioid and is able to manage it with miralax or prune juice.   PSYCHOSOCIAL: Patient states she has been under a lot of stress. Patient states her cousin passed away on 11-26-16. Patient states she is dealing with stress due to she and her husbands relationship.  Patient reports she is still going to her psychiatrist and counselor. States she has follow up appointments with both in May 2018.  RNCM offered [facility] care management social worker follow up to assist her with stress management. Patient refuses at this time and states she will consider at a later date. Patient states her dogs are important to her an provide her comfort.  Patient reports that her pain management doctor, Dr. [name] is going to contact her psychiatrist because he feels her antidepressant medication should be changed. Patient states she agrees because she has been on lexapro for numerous years.   BLOOD PRESSURE; patient reports she continues to monitor her blood pressure at least 1-2 times per week. Patient reports her blood pressure ranges from 116/60's to 139/ 60's.  RNCM called and spoke with [name] regarding TENS Unit referral. [name] states she will call patient today to set up appointment with patient at doctors office.  Contacted patient.  Patient states she received call from [place]. States appointment is scheduled for tomorrow 4/27/ 18 at 9:30am.  ASSESSMENT; Ongoing support and management needed due to chronic pain.  Patient given option of social work referral if needed to assist with stress management.   PLAN; RNCM  will follow up with patient within 1 month.  Patient will report if she has received appointment and tens units as referred Patient will report if psychiatrist has changed antidepressant.   Quinn Plowman RN,BSN,CCM [facility] Telephonic  [phone]

## 2016-11-29 DIAGNOSIS — F331 Major depressive disorder, recurrent, moderate: Secondary | ICD-10-CM | POA: Diagnosis not present

## 2016-11-29 DIAGNOSIS — F410 Panic disorder [episodic paroxysmal anxiety] without agoraphobia: Secondary | ICD-10-CM | POA: Diagnosis not present

## 2016-12-01 DIAGNOSIS — F410 Panic disorder [episodic paroxysmal anxiety] without agoraphobia: Secondary | ICD-10-CM | POA: Diagnosis not present

## 2016-12-01 DIAGNOSIS — F331 Major depressive disorder, recurrent, moderate: Secondary | ICD-10-CM | POA: Diagnosis not present

## 2016-12-01 DIAGNOSIS — F341 Dysthymic disorder: Secondary | ICD-10-CM | POA: Diagnosis not present

## 2016-12-08 ENCOUNTER — Encounter: Payer: Self-pay | Admitting: Family Medicine

## 2016-12-08 ENCOUNTER — Ambulatory Visit (INDEPENDENT_AMBULATORY_CARE_PROVIDER_SITE_OTHER): Payer: Medicare Other | Admitting: Family Medicine

## 2016-12-08 VITALS — BP 140/76 | HR 98 | Ht 61.0 in

## 2016-12-08 DIAGNOSIS — M545 Low back pain, unspecified: Secondary | ICD-10-CM

## 2016-12-08 DIAGNOSIS — F331 Major depressive disorder, recurrent, moderate: Secondary | ICD-10-CM | POA: Diagnosis not present

## 2016-12-08 NOTE — Progress Notes (Signed)
[name] is a 83 y.o. female who presents to [facility] today for left-sided low back pain. Patient notes a 2 day history of left-sided low back pain without significant radiating pain weakness or numbness. The pain worsened when patient sat up from her bed. Pain is worse with activity and better with rest. This pain is different and more severe than her chronic low back pain. She has tried treatment with ice which did not help and her chronic pain medications which did not help much. Symptoms are moderate to severe and  are interfering with quality of life.   Past Medical History:  Diagnosis Date  . Anxiety   . Arthritis    osteoarthritis  . Asthma   . C. difficile diarrhea   . Candida esophagitis ([place])   . Chronic sinusitis   . Depression   . Depression   . Diverticulosis   . DJD (degenerative joint disease)     L5 compression fracture  . Edema extremities   . Fibromyalgia   . GERD (gastroesophageal reflux disease)   . Gout   . Hyperlipidemia   . Hypertension   . Osteoarthritis   . Osteopenia   . Panic disorder   . Renal failure, unspecified   . Spinal stenosis of lumbar region   . Tubular adenoma of colon    Past Surgical History:  Procedure Laterality Date  . ADENOIDECTOMY    . CARPAL TUNNEL RELEASE  2007, 2009   Bilateral  . CATARACT EXTRACTION, BILATERAL    . CHOANAL ADENIODECTOMY    . DIAGNOSTIC LARYNGOSCOPY N/A 11/05/2015   Procedure: DIAGNOSTIC LARYNGOSCOPY AND ESOPHAGOSCOPY;  Surgeon: Rozetta Nunnery, MD;  Location: [facility];  Service: ENT;  Laterality: N/A;  . ganglion cyct removal on fingers     right  . ganglion cyst  wrist    . KNEE ARTHROSCOPY     right  . NASAL SINUS SURGERY    . SHOULDER SURGERY  2004, 07/30/10, 01/2011   after her right humeral neck fracture, revision hemiarthroplasty 2004 for persistent pain, revision reverse arthroplasty December 2011 for persistent pain, incision and drainage  with poly-exchange 01/26/2011 for possible prosthetic joint infection.  . TONSILLECTOMY AND ADENOIDECTOMY    . TOTAL ABDOMINAL HYSTERECTOMY    . TOTAL HIP ARTHROPLASTY  2004   right  . TOTAL KNEE ARTHROPLASTY  2006   right  . TOTAL SHOULDER REPLACEMENT  2002   right   Social History  Substance Use Topics  . Smoking status: Never Smoker  . Smokeless tobacco: Never Used  . Alcohol use No     ROS:  As above   Medications: Current Outpatient Prescriptions  Medication Sig Dispense Refill  . ALPRAZolam (XANAX) 0.5 MG tablet Take 0.5 mg by mouth 4 (four) times daily as needed for anxiety.     . AMBULATORY NON FORMULARY MEDICATION [place].  Dx: Rheumatoid Arthritis.  Use daily as needed for ambulation. 1 Units 0  . amLODipine (NORVASC) 10 MG tablet take 1 tablet by mouth once daily 90 tablet 2  . Calcium Carbonate-Vitamin D (CALCIUM 600+D) 600-400 MG-UNIT per tablet Take 1 tablet by mouth 2 (two) times daily.    . diclofenac sodium (VOLTAREN) 1 % GEL Apply 1 application topically 4 (four) times daily.  0  . diphenhydrAMINE (BENADRYL) 25 MG tablet Take 25 mg by mouth every 6 (six) hours as needed. For allergies    . EPINEPHrine (ADRENACLICK) 0.3 HU/7.6 mL IJ SOAJ  injection Inject 0.3 mLs (0.3 mg total) into the muscle once. 1 Device 1  . escitalopram (LEXAPRO) 20 MG tablet Take 20 mg by mouth daily.    . fentaNYL (DURAGESIC - DOSED MCG/HR) 12 MCG/HR Place 12.5 mcg onto the skin every 3 (three) days.    . hydrALAZINE (APRESOLINE) 25 MG tablet Take 25 mg by mouth. Patient states her doctor instructed her to take 1-2 tabs at bedtime.    . hydrochlorothiazide (HYDRODIURIL) 25 MG tablet take 1 tablet by mouth every morning for blood pressure 90 tablet 1  . ipratropium (ATROVENT) 0.06 % nasal spray Place 2 sprays into both nostrils 3 (three) times daily as needed for rhinitis. 15 mL 12  . montelukast (SINGULAIR) 10 MG tablet take 1 tablet by mouth at bedtime 30 tablet 3  . ondansetron  (ZOFRAN) 4 MG tablet Take 1-2 tablets (4-8 mg total) by mouth every 8 (eight) hours as needed for nausea or vomiting. 30 tablet 1  . oxyCODONE (OXY IR/ROXICODONE) 5 MG immediate release tablet take 1 tablet by mouth three times a day as needed for pain  0  . potassium chloride SA (K-DUR,KLOR-CON) 20 MEQ tablet take 1 tablet by mouth once daily 30 tablet 1  . simvastatin (ZOCOR) 10 MG tablet Take 1 tablet (10 mg total) by mouth daily. 90 tablet 1  . zoledronic acid (RECLAST) 5 MG/100ML SOLN Inject 5 mg into the vein. Once yearly. In June     No current facility-administered medications for this visit.    Allergies  Allergen Reactions  . Bee Venom Anaphylaxis    Has epi-pen  . Ceftin [Cefuroxime Axetil] Anaphylaxis  . Ciprofloxacin Anaphylaxis and Palpitations  . Ephedrine Other (See Comments) and Palpitations    "knocks me out"  . Sulfonamide Derivatives Anaphylaxis  . Telithromycin Anaphylaxis    Reaction: also blurred vision   . Valsartan Anaphylaxis  . Verapamil Anaphylaxis  . Venlafaxine Other (See Comments)    Insomnia, panic  . Beta Adrenergic Blockers     bradycardia  . Cephalosporins     Allergic Reaction  . Clindamycin/Lincomycin     Dry skin and itching/ Cdiff  . Cymbalta [Duloxetine Hcl] Other (See Comments)    Reaction:Confusion and " I fall down"  . Doxazosin     "blurred vision and irritable"  . Gabapentin Other (See Comments)    Reaction: headache  . Hydralazine     HA, Diarrhea  . Lamotrigine     Patient unsure at this time  . Mirtazapine Other (See Comments)    unknown  . Morphine Other (See Comments)    Medication has no effect with pain  . Oxycodone-Acetaminophen Hives  . Pregabalin Swelling  . Prochlorperazine Edisylate   . Pseudoephedrine Other (See Comments)    Reaction: hyperventilates and blacks out  . Relafen [Nabumetone] Swelling  . Zoloft [Sertraline] Other (See Comments)    "nervous/jittery"  . Doxycycline Hives     Exam:  BP 140/76    Pulse 98   Ht 5\' 1"  (1.549 m)  General: Well Developed, well nourished, and in no acute distress.  Neuro/Psych: Alert and oriented x3, extra-ocular muscles intact, able to move all 4 extremities, sensation grossly intact. Skin: Warm and dry, no rashes noted.  Respiratory: Not using accessory muscles, speaking in full sentences, trachea midline.  Cardiovascular: Pulses palpable, no extremity edema. Abdomen: Does not appear distended. MSK:  L-spine nontender to midline. Tender palpation left SI joint. Decreased motion and strength lower extremity is bilaterally.  Patient uses a Peppel to ambulate.    No results found for this or any previous visit (from the past 48 hour(s)). No results found.    Assessment and Plan: 83 y.o. female with  Acute exacerbation of chronic low back pain likely lumbosacral strain. Plan for home health physical therapy heating pad and TENS unit. Recheck in one month if not improved.    Orders Placed This Encounter  Procedures  . Ambulatory referral to Home Health    Referral Priority:   Routine    Referral Type:   Home Health Care    Referral Reason:   Specialty Services Required    Requested Specialty:   [place]    Number of Visits Requested:   1    Discussed warning signs or symptoms. Please see discharge instructions. Patient expresses understanding.

## 2016-12-08 NOTE — Patient Instructions (Signed)
Thank you for coming in today. Use a heating pad.  Attend PT.  Use the TENS unit.  Recheck in 1 month.   An assisted living home may be a good idea.   TENS UNIT: This is helpful for muscle pain and spasm.    TENS unit instructions: Do not shower or bathe with the unit on Turn the unit off before removing electrodes or batteries If the electrodes lose stickiness add a drop of water to the electrodes after they are disconnected from the unit and place on plastic sheet. If you continued to have difficulty, call the TENS unit company to purchase more electrodes. Do not apply lotion on the skin area prior to use. Make sure the skin is clean and dry as this will help prolong the life of the electrodes. After use, always check skin for unusual red areas, rash or other skin difficulties. If there are any skin problems, does not apply electrodes to the same area. Never remove the electrodes from the unit by pulling the wires. Do not use the TENS unit or electrodes other than as directed. Do not change electrode placement without consultating your therapist or physician. Keep 2 fingers with between each electrode. Wear time ratio is 2:1, on to off times.    For example on for 30 minutes off for 15 minutes and then on for 30 minutes off for 15 minutes    Lumbosacral Strain Lumbosacral strain is an injury that causes pain in the lower back (lumbosacral spine). This injury usually occurs from overstretching the muscles or ligaments along your spine. A strain can affect one or more muscles or cord-like tissues that connect bones to other bones (ligaments). What are the causes? This condition may be caused by:  A hard, direct hit (blow) to the back.  Excessive stretching of the lower back muscles. This may result from:  A fall.  Lifting something heavy.  Repetitive movements such as bending or crouching. What increases the risk? The following factors may increase your risk of getting this  condition:  Participating in sports or activities that involve:  A sudden twist of the back.  Pushing or pulling motions.  Being overweight or obese.  Having poor strength and flexibility, especially tight hamstrings or weak muscles in the back or abdomen.  Having too much of a curve in the lower back.  Having a pelvis that is tilted forward. What are the signs or symptoms? The main symptom of this condition is pain in the lower back, at the site of the strain. Pain may extend (radiate) down one or both legs. How is this diagnosed? This condition is diagnosed based on:  Your symptoms.  Your medical history.  A physical exam.  Your health care provider may push on certain areas of your back to determine the source of your pain.  You may be asked to bend forward, backward, and side to side to assess the severity of your pain and your range of motion.  Imaging tests, such as:  X-rays.  MRI. How is this treated? Treatment for this condition may include:  Putting heat and cold on the affected area.  Medicines to help relieve pain and relax your muscles (muscle relaxants).  NSAIDs to help reduce swelling and discomfort. When your symptoms improve, it is important to gradually return to your normal routine as soon as possible to reduce pain, avoid stiffness, and avoid loss of muscle strength. Generally, symptoms should improve within 6 weeks of treatment. However, recovery  time varies. Follow these instructions at home: Managing pain, stiffness, and swelling    If directed, put ice on the injured area during the first 24 hours after your strain.  Put ice in a plastic bag.  Place a towel between your skin and the bag.  Leave the ice on for 20 minutes, 2-3 times a day.  If directed, put heat on the affected area as often as told by your health care provider. Use the heat source that your health care provider recommends, such as a moist heat pack or a heating  pad.  Place a towel between your skin and the heat source.  Leave the heat on for 20-30 minutes.  Remove the heat if your skin turns bright red. This is especially important if you are unable to feel pain, heat, or cold. You may have a greater risk of getting burned. Activity   Rest and return to your normal activities as told by your health care provider. Ask your health care provider what activities are safe for you.  Avoid activities that take a lot of energy for as long as told by your health care provider. General instructions   Take over-the-counter and prescription medicines only as told by your health care provider.  Donot drive or use heavy machinery while taking prescription pain medicine.  Do not use any products that contain nicotine or tobacco, such as cigarettes and e-cigarettes. If you need help quitting, ask your health care provider.  Keep all follow-up visits as told by your health care provider. This is important. How is this prevented?  Use correct form when playing sports and lifting heavy objects.  Use good posture when sitting and standing.  Maintain a healthy weight.  Sleep on a mattress with medium firmness to support your back.  Be safe and responsible while being active to avoid falls.  Do at least 150 minutes of moderate-intensity exercise each week, such as brisk walking or water aerobics. Try a form of exercise that takes stress off your back, such as swimming or stationary cycling.  Maintain physical fitness, including:  Strength.  Flexibility.  Cardiovascular fitness.  Endurance. Contact a health care provider if:  Your back pain does not improve after 6 weeks of treatment.  Your symptoms get worse. Get help right away if:  Your back pain is severe.  You cannot stand or walk.  You have difficulty controlling when you urinate or when you have a bowel movement.  You feel nauseous or you vomit.  Your feet get very cold.  You  have numbness, tingling, weakness, or problems using your arms or legs.  You develop any of the following:  Shortness of breath.  Dizziness.  Pain in your legs.  Weakness in your buttocks or legs.  Discoloration of the skin on your toes or legs. This information is not intended to replace advice given to you by your health care provider. Make sure you discuss any questions you have with your health care provider. Document Released: 04/27/2005 Document Revised: 02/05/2016 Document Reviewed: 12/20/2015 Elsevier Interactive Patient Education  2017 [employer].

## 2016-12-09 ENCOUNTER — Ambulatory Visit: Payer: Medicare Other | Admitting: Sports Medicine

## 2016-12-10 DIAGNOSIS — Z96641 Presence of right artificial hip joint: Secondary | ICD-10-CM | POA: Diagnosis not present

## 2016-12-10 DIAGNOSIS — M545 Low back pain: Secondary | ICD-10-CM | POA: Diagnosis not present

## 2016-12-10 DIAGNOSIS — I129 Hypertensive chronic kidney disease with stage 1 through stage 4 chronic kidney disease, or unspecified chronic kidney disease: Secondary | ICD-10-CM | POA: Diagnosis not present

## 2016-12-10 DIAGNOSIS — M1712 Unilateral primary osteoarthritis, left knee: Secondary | ICD-10-CM | POA: Diagnosis not present

## 2016-12-10 DIAGNOSIS — R32 Unspecified urinary incontinence: Secondary | ICD-10-CM | POA: Diagnosis not present

## 2016-12-10 DIAGNOSIS — Z96651 Presence of right artificial knee joint: Secondary | ICD-10-CM | POA: Diagnosis not present

## 2016-12-10 DIAGNOSIS — Z96611 Presence of right artificial shoulder joint: Secondary | ICD-10-CM | POA: Diagnosis not present

## 2016-12-10 DIAGNOSIS — M1612 Unilateral primary osteoarthritis, left hip: Secondary | ICD-10-CM | POA: Diagnosis not present

## 2016-12-10 DIAGNOSIS — M069 Rheumatoid arthritis, unspecified: Secondary | ICD-10-CM | POA: Diagnosis not present

## 2016-12-10 DIAGNOSIS — M810 Age-related osteoporosis without current pathological fracture: Secondary | ICD-10-CM | POA: Diagnosis not present

## 2016-12-10 DIAGNOSIS — M791 Myalgia: Secondary | ICD-10-CM | POA: Diagnosis not present

## 2016-12-10 DIAGNOSIS — Z9181 History of falling: Secondary | ICD-10-CM | POA: Diagnosis not present

## 2016-12-10 DIAGNOSIS — N183 Chronic kidney disease, stage 3 (moderate): Secondary | ICD-10-CM | POA: Diagnosis not present

## 2016-12-10 DIAGNOSIS — F419 Anxiety disorder, unspecified: Secondary | ICD-10-CM | POA: Diagnosis not present

## 2016-12-10 DIAGNOSIS — J45909 Unspecified asthma, uncomplicated: Secondary | ICD-10-CM | POA: Diagnosis not present

## 2016-12-10 DIAGNOSIS — E785 Hyperlipidemia, unspecified: Secondary | ICD-10-CM | POA: Diagnosis not present

## 2016-12-10 DIAGNOSIS — G8929 Other chronic pain: Secondary | ICD-10-CM | POA: Diagnosis not present

## 2016-12-10 DIAGNOSIS — M1990 Unspecified osteoarthritis, unspecified site: Secondary | ICD-10-CM | POA: Diagnosis not present

## 2016-12-10 DIAGNOSIS — K219 Gastro-esophageal reflux disease without esophagitis: Secondary | ICD-10-CM | POA: Diagnosis not present

## 2016-12-10 DIAGNOSIS — F329 Major depressive disorder, single episode, unspecified: Secondary | ICD-10-CM | POA: Diagnosis not present

## 2016-12-13 ENCOUNTER — Ambulatory Visit: Payer: Self-pay | Admitting: Cardiology

## 2016-12-13 DIAGNOSIS — G8929 Other chronic pain: Secondary | ICD-10-CM | POA: Diagnosis not present

## 2016-12-13 DIAGNOSIS — M791 Myalgia: Secondary | ICD-10-CM | POA: Diagnosis not present

## 2016-12-13 DIAGNOSIS — M1612 Unilateral primary osteoarthritis, left hip: Secondary | ICD-10-CM | POA: Diagnosis not present

## 2016-12-13 DIAGNOSIS — M545 Low back pain: Secondary | ICD-10-CM | POA: Diagnosis not present

## 2016-12-13 DIAGNOSIS — N183 Chronic kidney disease, stage 3 (moderate): Secondary | ICD-10-CM | POA: Diagnosis not present

## 2016-12-13 DIAGNOSIS — I129 Hypertensive chronic kidney disease with stage 1 through stage 4 chronic kidney disease, or unspecified chronic kidney disease: Secondary | ICD-10-CM | POA: Diagnosis not present

## 2016-12-14 ENCOUNTER — Ambulatory Visit: Payer: Medicare Other | Admitting: Family Medicine

## 2016-12-17 ENCOUNTER — Emergency Department (HOSPITAL_COMMUNITY): Payer: Medicare Other

## 2016-12-17 ENCOUNTER — Inpatient Hospital Stay (HOSPITAL_COMMUNITY)
Admission: EM | Admit: 2016-12-17 | Discharge: 2016-12-21 | DRG: 501 | Disposition: A | Discharge status: Skilled Nursing Facility | Payer: Medicare Other | Attending: Internal Medicine | Admitting: Internal Medicine

## 2016-12-17 ENCOUNTER — Encounter (HOSPITAL_COMMUNITY): Payer: Self-pay

## 2016-12-17 DIAGNOSIS — F419 Anxiety disorder, unspecified: Secondary | ICD-10-CM | POA: Diagnosis present

## 2016-12-17 DIAGNOSIS — Z96641 Presence of right artificial hip joint: Secondary | ICD-10-CM | POA: Diagnosis present

## 2016-12-17 DIAGNOSIS — Y92000 Kitchen of unspecified non-institutional (private) residence as  the place of occurrence of the external cause: Secondary | ICD-10-CM

## 2016-12-17 DIAGNOSIS — S52532A Colles' fracture of left radius, initial encounter for closed fracture: Secondary | ICD-10-CM

## 2016-12-17 DIAGNOSIS — M069 Rheumatoid arthritis, unspecified: Secondary | ICD-10-CM | POA: Diagnosis not present

## 2016-12-17 DIAGNOSIS — Z96611 Presence of right artificial shoulder joint: Secondary | ICD-10-CM | POA: Diagnosis present

## 2016-12-17 DIAGNOSIS — E871 Hypo-osmolality and hyponatremia: Secondary | ICD-10-CM | POA: Diagnosis not present

## 2016-12-17 DIAGNOSIS — G894 Chronic pain syndrome: Secondary | ICD-10-CM | POA: Diagnosis present

## 2016-12-17 DIAGNOSIS — M19039 Primary osteoarthritis, unspecified wrist: Secondary | ICD-10-CM | POA: Diagnosis present

## 2016-12-17 DIAGNOSIS — M8580 Other specified disorders of bone density and structure, unspecified site: Secondary | ICD-10-CM | POA: Diagnosis present

## 2016-12-17 DIAGNOSIS — Z91030 Bee allergy status: Secondary | ICD-10-CM

## 2016-12-17 DIAGNOSIS — S52572A Other intraarticular fracture of lower end of left radius, initial encounter for closed fracture: Secondary | ICD-10-CM | POA: Diagnosis not present

## 2016-12-17 DIAGNOSIS — Z885 Allergy status to narcotic agent status: Secondary | ICD-10-CM

## 2016-12-17 DIAGNOSIS — S62102A Fracture of unspecified carpal bone, left wrist, initial encounter for closed fracture: Secondary | ICD-10-CM | POA: Diagnosis present

## 2016-12-17 DIAGNOSIS — Z881 Allergy status to other antibiotic agents status: Secondary | ICD-10-CM

## 2016-12-17 DIAGNOSIS — K219 Gastro-esophageal reflux disease without esophagitis: Secondary | ICD-10-CM | POA: Diagnosis present

## 2016-12-17 DIAGNOSIS — E876 Hypokalemia: Secondary | ICD-10-CM | POA: Diagnosis present

## 2016-12-17 DIAGNOSIS — M109 Gout, unspecified: Secondary | ICD-10-CM | POA: Diagnosis present

## 2016-12-17 DIAGNOSIS — Z8249 Family history of ischemic heart disease and other diseases of the circulatory system: Secondary | ICD-10-CM

## 2016-12-17 DIAGNOSIS — Z888 Allergy status to other drugs, medicaments and biological substances status: Secondary | ICD-10-CM

## 2016-12-17 DIAGNOSIS — Z9989 Dependence on other enabling machines and devices: Secondary | ICD-10-CM

## 2016-12-17 DIAGNOSIS — I10 Essential (primary) hypertension: Secondary | ICD-10-CM | POA: Diagnosis present

## 2016-12-17 DIAGNOSIS — M797 Fibromyalgia: Secondary | ICD-10-CM | POA: Diagnosis present

## 2016-12-17 DIAGNOSIS — W010XXA Fall on same level from slipping, tripping and stumbling without subsequent striking against object, initial encounter: Secondary | ICD-10-CM | POA: Diagnosis present

## 2016-12-17 DIAGNOSIS — T148XXA Other injury of unspecified body region, initial encounter: Secondary | ICD-10-CM | POA: Diagnosis not present

## 2016-12-17 DIAGNOSIS — M25532 Pain in left wrist: Secondary | ICD-10-CM | POA: Diagnosis not present

## 2016-12-17 DIAGNOSIS — Z833 Family history of diabetes mellitus: Secondary | ICD-10-CM

## 2016-12-17 DIAGNOSIS — Z79899 Other long term (current) drug therapy: Secondary | ICD-10-CM

## 2016-12-17 DIAGNOSIS — M6281 Muscle weakness (generalized): Secondary | ICD-10-CM | POA: Diagnosis not present

## 2016-12-17 DIAGNOSIS — G8929 Other chronic pain: Secondary | ICD-10-CM

## 2016-12-17 DIAGNOSIS — D72829 Elevated white blood cell count, unspecified: Secondary | ICD-10-CM | POA: Diagnosis not present

## 2016-12-17 DIAGNOSIS — R5381 Other malaise: Secondary | ICD-10-CM

## 2016-12-17 DIAGNOSIS — Z96651 Presence of right artificial knee joint: Secondary | ICD-10-CM | POA: Diagnosis present

## 2016-12-17 DIAGNOSIS — E785 Hyperlipidemia, unspecified: Secondary | ICD-10-CM | POA: Diagnosis present

## 2016-12-17 DIAGNOSIS — Z90710 Acquired absence of both cervix and uterus: Secondary | ICD-10-CM

## 2016-12-17 DIAGNOSIS — M549 Dorsalgia, unspecified: Secondary | ICD-10-CM | POA: Diagnosis present

## 2016-12-17 DIAGNOSIS — F329 Major depressive disorder, single episode, unspecified: Secondary | ICD-10-CM | POA: Diagnosis present

## 2016-12-17 DIAGNOSIS — F410 Panic disorder [episodic paroxysmal anxiety] without agoraphobia: Secondary | ICD-10-CM | POA: Diagnosis present

## 2016-12-17 DIAGNOSIS — J45909 Unspecified asthma, uncomplicated: Secondary | ICD-10-CM | POA: Diagnosis present

## 2016-12-17 DIAGNOSIS — Z882 Allergy status to sulfonamides status: Secondary | ICD-10-CM

## 2016-12-17 MED ORDER — OXYCODONE HCL 5 MG PO TABS
5.0000 mg | ORAL_TABLET | Freq: Once | ORAL | Status: AC
  Administered 2016-12-18: 5 mg via ORAL
  Filled 2016-12-17: qty 1

## 2016-12-17 MED ORDER — FENTANYL CITRATE (PF) 100 MCG/2ML IJ SOLN
50.0000 ug | Freq: Once | INTRAMUSCULAR | Status: AC
  Administered 2016-12-18: 50 ug via INTRAVENOUS
  Filled 2016-12-17: qty 2

## 2016-12-17 NOTE — ED Provider Notes (Signed)
[facility] DEPT Provider Note   CSN: [id number] Arrival date & time: 12/17/16  2242     History   Chief Complaint Chief Complaint  Patient presents with  . Fall    HPI [name] is a 83 y.o. female.  HPI The patient was trying to water out of her refrigerator when she lost her balance and fell backwards. At baseline she uses a Dewing to get around her house. She was unable to get back up. Her husband and also fairly debilitated and uses a Hendricksen. He was unable to assist her. They called EMS and EMS was able to assist the patient to a standing position. She denies that she had pain in her hips or legs once in the standing position. Her son who was than their reports she was fairly imbalanced however and needed much assistance. A she reports the only place she has pain is in her left wrist. She reports that severe. At baseline she does use the fentanyl and oxycodone for control of chronic pain. She denies any head injury or neck pain. She reports she has chronic pain of her shoulders back and legs but there is no change since a fall. Past Medical History:  Diagnosis Date  . Anxiety   . Arthritis    osteoarthritis  . Asthma   . C. difficile diarrhea   . Candida esophagitis ([place])   . Chronic sinusitis   . Depression   . Depression   . Diverticulosis   . DJD (degenerative joint disease)     L5 compression fracture  . Edema extremities   . Fibromyalgia   . GERD (gastroesophageal reflux disease)   . Gout   . Hyperlipidemia   . Hypertension   . Osteoarthritis   . Osteopenia   . Panic disorder   . Renal failure, unspecified   . Spinal stenosis of lumbar region   . Tubular adenoma of colon     Patient Active Problem List   Diagnosis Date Noted  . Fracture of left wrist 12/18/2016  . Lumbago 12/08/2016  . Status post total shoulder arthroplasty, right 11/11/2016  . S/P total knee arthroplasty, right 11/11/2016  . Primary osteoarthritis of left hip 07/28/2016  .  History of total right hip arthroplasty 06/17/2016  . Prediabetes 02/24/2016  . Chronic kidney disease (CKD), stage III (moderate) 02/24/2016  . Weakness 04/20/2015  . Elevated LFTs 03/10/2015  . Seasonal allergies 02/24/2015  . Primary osteoarthritis of left knee 01/01/2015  . Right ankle pain 10/09/2014  . Constipation 05/23/2014  . Left shoulder pain 04/30/2014  . Osteoporosis 03/12/2014  . Personal history of colonic polyps 03/12/2014  . Chronic rheumatic arthritis ([place]) 02/19/2014  . Anxiety and depression 02/19/2014  . Bradycardia 06/03/2013  . Edema extremities   . GERD (gastroesophageal reflux disease)   . HTN (hypertension) 03/31/2011  . Dyslipidemia 03/31/2011  . Depression 03/31/2011  . Fibromyalgia 03/31/2011  . DJD (degenerative joint disease) 03/31/2011  . IBD 09/27/2010  . COUGH, CHRONIC 09/24/2010    Past Surgical History:  Procedure Laterality Date  . ADENOIDECTOMY    . CARPAL TUNNEL RELEASE  2007, 2009   Bilateral  . CATARACT EXTRACTION, BILATERAL    . CHOANAL ADENIODECTOMY    . DIAGNOSTIC LARYNGOSCOPY N/A 11/05/2015   Procedure: DIAGNOSTIC LARYNGOSCOPY AND ESOPHAGOSCOPY;  Surgeon: Rozetta Nunnery, MD;  Location: [facility];  Service: ENT;  Laterality: N/A;  . ganglion cyct removal on fingers     right  . ganglion cyst  wrist    . KNEE ARTHROSCOPY     right  . NASAL SINUS SURGERY    . SHOULDER SURGERY  2004, 07/30/10, 01/2011   after her right humeral neck fracture, revision hemiarthroplasty 2004 for persistent pain, revision reverse arthroplasty December 2011 for persistent pain, incision and drainage with poly-exchange 01/26/2011 for possible prosthetic joint infection.  . TONSILLECTOMY AND ADENOIDECTOMY    . TOTAL ABDOMINAL HYSTERECTOMY    . TOTAL HIP ARTHROPLASTY  2004   right  . TOTAL KNEE ARTHROPLASTY  2006   right  . TOTAL SHOULDER REPLACEMENT  2002   right    OB History    No data available       Home Medications    Prior to  Admission medications   Medication Sig Start Date End Date Taking? Authorizing Provider  ALPRAZolam Duanne Moron) 0.5 MG tablet Take 0.5 mg by mouth 4 (four) times daily as needed for anxiety.     [provider]  AMBULATORY NON FORMULARY MEDICATION Rolling Aguila.  Dx: Rheumatoid Arthritis.  Use daily as needed for ambulation. 06/17/15   Marcial Pacas, DO  amLODipine (NORVASC) 10 MG tablet take 1 tablet by mouth once daily 11/22/16   Gregor Hams, MD  Calcium Carbonate-Vitamin D (CALCIUM 600+D) 600-400 MG-UNIT per tablet Take 1 tablet by mouth 2 (two) times daily.    [provider]  diclofenac sodium (VOLTAREN) 1 % GEL Apply 1 application topically 4 (four) times daily. 11/06/15   [provider]  diphenhydrAMINE (BENADRYL) 25 MG tablet Take 25 mg by mouth every 6 (six) hours as needed. For allergies    [provider]  EPINEPHrine (ADRENACLICK) 0.3 SW/1.0 mL IJ SOAJ injection Inject 0.3 mLs (0.3 mg total) into the muscle once. 12/30/15   Hommel, Sean, DO  escitalopram (LEXAPRO) 20 MG tablet Take 20 mg by mouth daily.    [provider]  hydrALAZINE (APRESOLINE) 25 MG tablet Take 25 mg by mouth. Patient states her doctor instructed her to take 1-2 tabs at bedtime.    [provider]  hydrochlorothiazide (HYDRODIURIL) 25 MG tablet take 1 tablet by mouth every morning for blood pressure 10/31/16   Gregor Hams, MD  ipratropium (ATROVENT) 0.06 % nasal spray Place 2 sprays into both nostrils 3 (three) times daily as needed for rhinitis. 11/25/14   Marcial Pacas, DO  montelukast (SINGULAIR) 10 MG tablet take 1 tablet by mouth at bedtime 08/26/16   Gregor Hams, MD  ondansetron (ZOFRAN) 4 MG tablet Take 1-2 tablets (4-8 mg total) by mouth every 8 (eight) hours as needed for nausea or vomiting. 07/13/16   Iran Planas L, PA-C  oxyCODONE (OXY IR/ROXICODONE) 5 MG immediate release tablet take 1 tablet by mouth three times a day as needed for pain 04/06/16   [provider]  potassium chloride SA (K-DUR,KLOR-CON) 20 MEQ tablet take 1 tablet by mouth once daily 11/22/16   Sueanne Margarita, MD  simvastatin (ZOCOR) 10 MG tablet Take 1 tablet (10 mg total) by mouth daily. 07/13/16   Breeback, Royetta Car, PA-C  zoledronic acid (RECLAST) 5 MG/100ML SOLN Inject 5 mg into the vein. Once yearly. In June    [provider]    Family History Family History  Problem Relation Age of Onset  . Bladder Cancer Father   . Diabetes Mother   . Hypertension Mother   . Breast cancer Unknown   . Colon polyps Unknown   . Depression Son  Social History Social History  Substance Use Topics  . Smoking status: Never Smoker  . Smokeless tobacco: Never Used  . Alcohol use No     Allergies   Bee venom; Ceftin [cefuroxime axetil]; Ciprofloxacin; Ephedrine; Sulfonamide derivatives; Telithromycin; Valsartan; Verapamil; Venlafaxine; Beta adrenergic blockers; Cephalosporins; Clindamycin/lincomycin; Cymbalta [duloxetine hcl]; Doxazosin; Gabapentin; Hydralazine; Lamotrigine; Mirtazapine; Morphine; Oxycodone-acetaminophen; Pregabalin; Prochlorperazine edisylate; Pseudoephedrine; Relafen [nabumetone]; Zoloft [sertraline]; and Doxycycline   Review of Systems Review of Systems 10 Systems reviewed and are negative for acute change except as noted in the HPI.   Physical Exam Updated Vital Signs BP 131/66   Pulse (!) 112   Temp 98.5 F (36.9 C) (Oral)   Resp 18   SpO2 92%   Physical Exam  Constitutional: She is oriented to person, place, and time.  Patient is alert and nontoxic. No respiratory distress. Obese and deconditioned.  HENT:  Head: Normocephalic and atraumatic.  Nose: Nose normal.  Mouth/Throat: Oropharynx is clear and moist.  Eyes: EOM are normal. Pupils are equal, round, and reactive to light.  Neck:  No cervical spine tenderness to palpation.  Cardiovascular: Normal rate, regular rhythm, normal heart sounds and intact distal pulses.     Pulmonary/Chest: Effort normal and breath sounds normal. She exhibits no tenderness.  Abdominal: Soft. She exhibits no distension. There is no tenderness. There is no guarding.  Musculoskeletal:  Patient has obvious dinner fork deformity of left forearm. Neurovascularly intact. Extremity normal range of motion on the right. Bilateral lower extremities no pain with range of motion.  Neurological: She is alert and oriented to person, place, and time. No cranial nerve deficit. She exhibits normal muscle tone. Coordination normal.  Skin: Skin is warm and dry.  Psychiatric: She has a normal mood and affect.     ED Treatments / Results  Labs (all labs ordered are listed, but only abnormal results are displayed) Labs Reviewed  BASIC METABOLIC PANEL  CBC WITH DIFFERENTIAL/PLATELET  PROTIME-INR    EKG  EKG Interpretation None       Radiology No results found.  Procedures Procedures (including critical care time)  Medications Ordered in ED Medications  enoxaparin (LOVENOX) injection 40 mg (not administered)  amLODipine (NORVASC) tablet 10 mg (not administered)  ALPRAZolam (XANAX) tablet 0.5 mg (not administered)  oxyCODONE (Oxy IR/ROXICODONE) immediate release tablet 5 mg (not administered)  potassium chloride SA (K-DUR,KLOR-CON) CR tablet 20 mEq (not administered)  simvastatin (ZOCOR) tablet 10 mg (not administered)  ondansetron (ZOFRAN) tablet 4-8 mg (not administered)  montelukast (SINGULAIR) tablet 10 mg (not administered)  ipratropium (ATROVENT) 0.06 % nasal spray 2 spray (not administered)  hydrochlorothiazide (HYDRODIURIL) tablet 25 mg (not administered)  escitalopram (LEXAPRO) tablet 20 mg (not administered)  diphenhydrAMINE (BENADRYL) tablet 25 mg (not administered)  diclofenac sodium (VOLTAREN) 1 % transdermal gel 1 application (not administered)  Calcium Carbonate-Vitamin D 600-400 MG-UNIT 1 tablet (not administered)  fentaNYL (SUBLIMAZE) injection 50 mcg (50 mcg  Intravenous Given 12/18/16 0003)  oxyCODONE (Oxy IR/ROXICODONE) immediate release tablet 5 mg (5 mg Oral Given 12/18/16 0000)     Initial Impression / Assessment and Plan / ED Course  I have reviewed the triage vital signs and the nursing notes.  Pertinent labs & imaging results that were available during my care of the patient were reviewed by me and considered in my medical decision making (see chart for details).    Consult: [facility] hospitalist for admission. Final Clinical Impressions(s) / ED Diagnoses   Final diagnoses:  Dependent on [place] for ambulation  Physical deconditioning  Other chronic pain  Closed Colles' fracture of left radius, initial encounter   Patient has forearm fracture from fall. No other associated injuries. Patient is physically deconditioned and dependent on a Gimpel for going to the bathroom or any in-home activities. Her husband is equally debilitated and Fresquez dependent. He is unable to assist the patient for transfers. The patient's son who lives next door is also unfortunately in a [employer] optician and physically unable at this time to provide assistance with ADLs. Patient will be admitted for failure of ADLs and inability to function in home. New Prescriptions New Prescriptions   No medications on file     Charlesetta Shanks, MD 01/04/17 [phone]

## 2016-12-17 NOTE — ED Triage Notes (Signed)
Patient was at home and fell.  Has obvious injury to left wrist.  Was splinted by EMS.  Pulses still present and can move fingers easily.  Patient states she tripped and fell while in the kitchen but does not remember hitting her head.  EMS gave 100 mcg fentanyl and 4 mg zofran en route.  Patient does have underlying dementia and needs things repeated at times.

## 2016-12-17 NOTE — ED Notes (Signed)
Patient's ring removed and placed in container at bedside table with label on them

## 2016-12-17 NOTE — ED Notes (Signed)
EDP at bedside  

## 2016-12-18 ENCOUNTER — Encounter (HOSPITAL_COMMUNITY): Payer: Self-pay | Admitting: Anesthesiology

## 2016-12-18 ENCOUNTER — Encounter (HOSPITAL_COMMUNITY)
Admission: EM | Disposition: A | Discharge status: Skilled Nursing Facility | Payer: Self-pay | Source: Home / Self Care | Attending: Internal Medicine

## 2016-12-18 ENCOUNTER — Observation Stay (HOSPITAL_COMMUNITY): Payer: Medicare Other | Admitting: Anesthesiology

## 2016-12-18 DIAGNOSIS — S52572A Other intraarticular fracture of lower end of left radius, initial encounter for closed fracture: Secondary | ICD-10-CM | POA: Diagnosis not present

## 2016-12-18 DIAGNOSIS — S62102A Fracture of unspecified carpal bone, left wrist, initial encounter for closed fracture: Secondary | ICD-10-CM

## 2016-12-18 DIAGNOSIS — Z96611 Presence of right artificial shoulder joint: Secondary | ICD-10-CM | POA: Diagnosis present

## 2016-12-18 DIAGNOSIS — Z96651 Presence of right artificial knee joint: Secondary | ICD-10-CM | POA: Diagnosis present

## 2016-12-18 DIAGNOSIS — M25532 Pain in left wrist: Secondary | ICD-10-CM | POA: Diagnosis present

## 2016-12-18 DIAGNOSIS — F410 Panic disorder [episodic paroxysmal anxiety] without agoraphobia: Secondary | ICD-10-CM | POA: Diagnosis present

## 2016-12-18 DIAGNOSIS — G894 Chronic pain syndrome: Secondary | ICD-10-CM | POA: Diagnosis not present

## 2016-12-18 DIAGNOSIS — D72829 Elevated white blood cell count, unspecified: Secondary | ICD-10-CM | POA: Diagnosis not present

## 2016-12-18 DIAGNOSIS — Z90710 Acquired absence of both cervix and uterus: Secondary | ICD-10-CM | POA: Diagnosis not present

## 2016-12-18 DIAGNOSIS — Z96641 Presence of right artificial hip joint: Secondary | ICD-10-CM | POA: Diagnosis present

## 2016-12-18 DIAGNOSIS — Z9989 Dependence on other enabling machines and devices: Secondary | ICD-10-CM | POA: Diagnosis not present

## 2016-12-18 DIAGNOSIS — S6990XA Unspecified injury of unspecified wrist, hand and finger(s), initial encounter: Secondary | ICD-10-CM | POA: Diagnosis not present

## 2016-12-18 DIAGNOSIS — M549 Dorsalgia, unspecified: Secondary | ICD-10-CM | POA: Diagnosis not present

## 2016-12-18 DIAGNOSIS — M109 Gout, unspecified: Secondary | ICD-10-CM | POA: Diagnosis present

## 2016-12-18 DIAGNOSIS — M19039 Primary osteoarthritis, unspecified wrist: Secondary | ICD-10-CM | POA: Diagnosis not present

## 2016-12-18 DIAGNOSIS — F419 Anxiety disorder, unspecified: Secondary | ICD-10-CM | POA: Diagnosis present

## 2016-12-18 DIAGNOSIS — N183 Chronic kidney disease, stage 3 (moderate): Secondary | ICD-10-CM | POA: Diagnosis not present

## 2016-12-18 DIAGNOSIS — E785 Hyperlipidemia, unspecified: Secondary | ICD-10-CM | POA: Diagnosis present

## 2016-12-18 DIAGNOSIS — S5290XA Unspecified fracture of unspecified forearm, initial encounter for closed fracture: Secondary | ICD-10-CM | POA: Diagnosis not present

## 2016-12-18 DIAGNOSIS — M797 Fibromyalgia: Secondary | ICD-10-CM | POA: Diagnosis not present

## 2016-12-18 DIAGNOSIS — S52532A Colles' fracture of left radius, initial encounter for closed fracture: Secondary | ICD-10-CM | POA: Diagnosis not present

## 2016-12-18 DIAGNOSIS — S62102D Fracture of unspecified carpal bone, left wrist, subsequent encounter for fracture with routine healing: Secondary | ICD-10-CM | POA: Diagnosis not present

## 2016-12-18 DIAGNOSIS — M069 Rheumatoid arthritis, unspecified: Secondary | ICD-10-CM | POA: Diagnosis not present

## 2016-12-18 DIAGNOSIS — Y92000 Kitchen of unspecified non-institutional (private) residence as  the place of occurrence of the external cause: Secondary | ICD-10-CM | POA: Diagnosis not present

## 2016-12-18 DIAGNOSIS — Z8249 Family history of ischemic heart disease and other diseases of the circulatory system: Secondary | ICD-10-CM | POA: Diagnosis not present

## 2016-12-18 DIAGNOSIS — K219 Gastro-esophageal reflux disease without esophagitis: Secondary | ICD-10-CM | POA: Diagnosis present

## 2016-12-18 DIAGNOSIS — G8929 Other chronic pain: Secondary | ICD-10-CM | POA: Diagnosis not present

## 2016-12-18 DIAGNOSIS — W010XXA Fall on same level from slipping, tripping and stumbling without subsequent striking against object, initial encounter: Secondary | ICD-10-CM | POA: Diagnosis not present

## 2016-12-18 DIAGNOSIS — W19XXXD Unspecified fall, subsequent encounter: Secondary | ICD-10-CM | POA: Diagnosis not present

## 2016-12-18 DIAGNOSIS — I129 Hypertensive chronic kidney disease with stage 1 through stage 4 chronic kidney disease, or unspecified chronic kidney disease: Secondary | ICD-10-CM | POA: Diagnosis not present

## 2016-12-18 DIAGNOSIS — E871 Hypo-osmolality and hyponatremia: Secondary | ICD-10-CM | POA: Diagnosis not present

## 2016-12-18 DIAGNOSIS — M8580 Other specified disorders of bone density and structure, unspecified site: Secondary | ICD-10-CM | POA: Diagnosis present

## 2016-12-18 DIAGNOSIS — M1990 Unspecified osteoarthritis, unspecified site: Secondary | ICD-10-CM | POA: Diagnosis not present

## 2016-12-18 DIAGNOSIS — F329 Major depressive disorder, single episode, unspecified: Secondary | ICD-10-CM | POA: Diagnosis present

## 2016-12-18 DIAGNOSIS — J45909 Unspecified asthma, uncomplicated: Secondary | ICD-10-CM | POA: Diagnosis not present

## 2016-12-18 DIAGNOSIS — I10 Essential (primary) hypertension: Secondary | ICD-10-CM | POA: Diagnosis not present

## 2016-12-18 DIAGNOSIS — E876 Hypokalemia: Secondary | ICD-10-CM | POA: Diagnosis not present

## 2016-12-18 DIAGNOSIS — G8918 Other acute postprocedural pain: Secondary | ICD-10-CM | POA: Diagnosis not present

## 2016-12-18 HISTORY — PX: OPEN REDUCTION INTERNAL FIXATION (ORIF) DISTAL RADIAL FRACTURE: SHX5989

## 2016-12-18 LAB — BASIC METABOLIC PANEL
ANION GAP: 13 (ref 5–15)
BUN: 15 mg/dL (ref 6–20)
CALCIUM: 8.8 mg/dL — AB (ref 8.9–10.3)
CO2: 24 mmol/L (ref 22–32)
CREATININE: 1.32 mg/dL — AB (ref 0.44–1.00)
Chloride: 96 mmol/L — ABNORMAL LOW (ref 101–111)
GFR calc non Af Amer: 37 mL/min — ABNORMAL LOW (ref >60)
GFR, EST AFRICAN AMERICAN: 43 mL/min — AB (ref >60)
Glucose, Bld: 123 mg/dL — ABNORMAL HIGH (ref 65–99)
Potassium: 3.6 mmol/L (ref 3.5–5.1)
SODIUM: 133 mmol/L — AB (ref 135–145)

## 2016-12-18 LAB — CBC WITH DIFFERENTIAL/PLATELET
Basophils Absolute: 0 10*3/uL (ref 0.0–0.1)
Basophils Relative: 0 %
EOS PCT: 0 %
Eosinophils Absolute: 0 10*3/uL (ref 0.0–0.7)
HCT: 42.7 % (ref 36.0–46.0)
Hemoglobin: 14.2 g/dL (ref 12.0–15.0)
Lymphocytes Relative: 12 %
Lymphs Abs: 2.1 10*3/uL (ref 0.7–4.0)
MCH: 30.1 pg (ref 26.0–34.0)
MCHC: 33.3 g/dL (ref 30.0–36.0)
MCV: 90.5 fL (ref 78.0–100.0)
MONO ABS: 1.4 10*3/uL — AB (ref 0.1–1.0)
MONOS PCT: 8 %
NEUTROS ABS: 13.9 10*3/uL — AB (ref 1.7–7.7)
Neutrophils Relative %: 80 %
Platelets: 314 10*3/uL (ref 150–400)
RBC: 4.72 MIL/uL (ref 3.87–5.11)
RDW: 13.1 % (ref 11.5–15.5)
WBC: 17.4 10*3/uL — ABNORMAL HIGH (ref 4.0–10.5)

## 2016-12-18 LAB — PROTIME-INR
INR: 1.11
Prothrombin Time: 14.4 seconds (ref 11.4–15.2)

## 2016-12-18 SURGERY — OPEN REDUCTION INTERNAL FIXATION (ORIF) DISTAL RADIUS FRACTURE
Anesthesia: General | Site: Wrist | Laterality: Left

## 2016-12-18 MED ORDER — OXYCODONE HCL 5 MG PO TABS
10.0000 mg | ORAL_TABLET | Freq: Four times a day (QID) | ORAL | Status: DC | PRN
  Administered 2016-12-18 (×2): 10 mg via ORAL
  Administered 2016-12-18 – 2016-12-19 (×2): 15 mg via ORAL
  Administered 2016-12-19 – 2016-12-21 (×7): 10 mg via ORAL
  Filled 2016-12-18 (×2): qty 2
  Filled 2016-12-18: qty 3
  Filled 2016-12-18: qty 2
  Filled 2016-12-18: qty 3
  Filled 2016-12-18 (×6): qty 2

## 2016-12-18 MED ORDER — CALCIUM CARBONATE-VITAMIN D 600-400 MG-UNIT PO TABS: 1.0000 | ORAL_TABLET | Freq: Two times a day (BID) | ORAL | Status: DC

## 2016-12-18 MED ORDER — AMLODIPINE BESYLATE 10 MG PO TABS
10.0000 mg | ORAL_TABLET | Freq: Every day | ORAL | Status: DC
  Administered 2016-12-19 – 2016-12-21 (×3): 10 mg via ORAL
  Filled 2016-12-18 (×3): qty 1

## 2016-12-18 MED ORDER — MONTELUKAST SODIUM 10 MG PO TABS
10.0000 mg | ORAL_TABLET | Freq: Every day | ORAL | Status: DC
  Administered 2016-12-18 – 2016-12-20 (×4): 10 mg via ORAL
  Filled 2016-12-18 (×4): qty 1

## 2016-12-18 MED ORDER — FENTANYL CITRATE (PF) 250 MCG/5ML IJ SOLN
INTRAMUSCULAR | Status: AC
  Filled 2016-12-18: qty 5

## 2016-12-18 MED ORDER — ENOXAPARIN SODIUM 40 MG/0.4ML ~~LOC~~ SOLN: 40.0000 mg | SUBCUTANEOUS | Status: DC

## 2016-12-18 MED ORDER — MIDAZOLAM HCL 2 MG/2ML IJ SOLN
INTRAMUSCULAR | Status: AC
  Filled 2016-12-18: qty 2

## 2016-12-18 MED ORDER — ESCITALOPRAM OXALATE 10 MG PO TABS: 20.0000 mg | ORAL_TABLET | Freq: Every day | ORAL | Status: DC

## 2016-12-18 MED ORDER — FENTANYL CITRATE (PF) 100 MCG/2ML IJ SOLN
INTRAMUSCULAR | Status: DC | PRN
  Administered 2016-12-18 (×2): 50 ug via INTRAVENOUS

## 2016-12-18 MED ORDER — ALPRAZOLAM 0.5 MG PO TABS
0.5000 mg | ORAL_TABLET | Freq: Four times a day (QID) | ORAL | Status: DC | PRN
  Administered 2016-12-19 – 2016-12-21 (×6): 0.5 mg via ORAL
  Filled 2016-12-18 (×6): qty 1

## 2016-12-18 MED ORDER — LIDOCAINE 2% (20 MG/ML) 5 ML SYRINGE
INTRAMUSCULAR | Status: AC
  Filled 2016-12-18: qty 5

## 2016-12-18 MED ORDER — 0.9 % SODIUM CHLORIDE (POUR BTL) OPTIME
TOPICAL | Status: DC | PRN
  Administered 2016-12-18: 1000 mL

## 2016-12-18 MED ORDER — METOCLOPRAMIDE HCL 5 MG PO TABS: 5.0000 mg | ORAL_TABLET | Freq: Three times a day (TID) | ORAL | Status: DC | PRN

## 2016-12-18 MED ORDER — VANCOMYCIN HCL IN DEXTROSE 1-5 GM/200ML-% IV SOLN
1000.0000 mg | Freq: Two times a day (BID) | INTRAVENOUS | Status: AC
  Administered 2016-12-18: 1000 mg via INTRAVENOUS
  Filled 2016-12-18: qty 200

## 2016-12-18 MED ORDER — ONDANSETRON HCL 4 MG/2ML IJ SOLN: 4.0000 mg | Freq: Four times a day (QID) | INTRAMUSCULAR | Status: DC | PRN

## 2016-12-18 MED ORDER — ONDANSETRON HCL 4 MG PO TABS
4.0000 mg | ORAL_TABLET | Freq: Three times a day (TID) | ORAL | Status: DC | PRN
  Administered 2016-12-21: 8 mg via ORAL
  Filled 2016-12-18: qty 1

## 2016-12-18 MED ORDER — DICLOFENAC SODIUM 1 % TD GEL
1.0000 "application " | Freq: Four times a day (QID) | TRANSDERMAL | Status: DC
  Administered 2016-12-18 – 2016-12-21 (×10): 1 via TOPICAL
  Filled 2016-12-18: qty 100

## 2016-12-18 MED ORDER — PROPOFOL 10 MG/ML IV BOLUS
INTRAVENOUS | Status: AC
  Filled 2016-12-18: qty 20

## 2016-12-18 MED ORDER — PROPOFOL 10 MG/ML IV BOLUS
INTRAVENOUS | Status: DC | PRN
  Administered 2016-12-18: 200 mg via INTRAVENOUS

## 2016-12-18 MED ORDER — POTASSIUM CHLORIDE CRYS ER 20 MEQ PO TBCR
20.0000 meq | EXTENDED_RELEASE_TABLET | Freq: Every day | ORAL | Status: DC
Start: 2016-12-18 — End: 2016-12-19

## 2016-12-18 MED ORDER — ONDANSETRON HCL 4 MG/2ML IJ SOLN
INTRAMUSCULAR | Status: DC | PRN
  Administered 2016-12-18: 4 mg via INTRAVENOUS

## 2016-12-18 MED ORDER — ONDANSETRON HCL 4 MG PO TABS: 4.0000 mg | ORAL_TABLET | Freq: Four times a day (QID) | ORAL | Status: DC | PRN

## 2016-12-18 MED ORDER — IPRATROPIUM BROMIDE 0.06 % NA SOLN: 2.0000 | Freq: Three times a day (TID) | NASAL | Status: DC | PRN

## 2016-12-18 MED ORDER — FENTANYL CITRATE (PF) 100 MCG/2ML IJ SOLN
50.0000 ug | Freq: Once | INTRAMUSCULAR | Status: AC
  Administered 2016-12-18: 50 ug via INTRAVENOUS
  Filled 2016-12-18: qty 2

## 2016-12-18 MED ORDER — VENLAFAXINE HCL ER 37.5 MG PO CP24
37.5000 mg | ORAL_CAPSULE | Freq: Every day | ORAL | Status: DC
  Filled 2016-12-18 (×4): qty 1

## 2016-12-18 MED ORDER — ACETAMINOPHEN 10 MG/ML IV SOLN
INTRAVENOUS | Status: AC
  Filled 2016-12-18: qty 100

## 2016-12-18 MED ORDER — BUPIVACAINE-EPINEPHRINE (PF) 0.5% -1:200000 IJ SOLN
INTRAMUSCULAR | Status: DC | PRN
  Administered 2016-12-18: 30 mL via PERINEURAL

## 2016-12-18 MED ORDER — HYDROCHLOROTHIAZIDE 25 MG PO TABS
25.0000 mg | ORAL_TABLET | Freq: Every day | ORAL | Status: DC
  Administered 2016-12-19 – 2016-12-21 (×3): 25 mg via ORAL
  Filled 2016-12-18 (×3): qty 1

## 2016-12-18 MED ORDER — OXYCODONE HCL 5 MG PO TABS: 5.0000 mg | ORAL_TABLET | Freq: Four times a day (QID) | ORAL | Status: DC | PRN

## 2016-12-18 MED ORDER — VANCOMYCIN HCL IN DEXTROSE 1-5 GM/200ML-% IV SOLN
INTRAVENOUS | Status: AC
  Filled 2016-12-18: qty 200

## 2016-12-18 MED ORDER — SODIUM CHLORIDE 0.9 % IV SOLN
INTRAVENOUS | Status: DC | PRN
  Administered 2016-12-18: 1000 mg via INTRAVENOUS

## 2016-12-18 MED ORDER — SIMVASTATIN 10 MG PO TABS
10.0000 mg | ORAL_TABLET | Freq: Every day | ORAL | Status: DC
Start: 2016-12-18 — End: 2016-12-18

## 2016-12-18 MED ORDER — PHENYLEPHRINE 40 MCG/ML (10ML) SYRINGE FOR IV PUSH (FOR BLOOD PRESSURE SUPPORT)
PREFILLED_SYRINGE | INTRAVENOUS | Status: AC
  Filled 2016-12-18: qty 10

## 2016-12-18 MED ORDER — SIMVASTATIN 10 MG PO TABS
10.0000 mg | ORAL_TABLET | Freq: Every day | ORAL | Status: DC
  Administered 2016-12-18 – 2016-12-20 (×3): 10 mg via ORAL
  Filled 2016-12-18 (×3): qty 1

## 2016-12-18 MED ORDER — WHITE PETROLATUM GEL
Status: AC
  Administered 2016-12-18: 1
  Filled 2016-12-18: qty 1

## 2016-12-18 MED ORDER — ACETAMINOPHEN 10 MG/ML IV SOLN
INTRAVENOUS | Status: DC | PRN
  Administered 2016-12-18: 1000 mg via INTRAVENOUS

## 2016-12-18 MED ORDER — MEPIVACAINE HCL 1.5 % IJ SOLN
INTRAMUSCULAR | Status: DC | PRN
  Administered 2016-12-18: 15 mL via EPIDURAL

## 2016-12-18 MED ORDER — LIDOCAINE HCL (CARDIAC) 20 MG/ML IV SOLN
INTRAVENOUS | Status: DC | PRN
  Administered 2016-12-18: 60 mg via INTRAVENOUS

## 2016-12-18 MED ORDER — ONDANSETRON HCL 4 MG/2ML IJ SOLN
INTRAMUSCULAR | Status: AC
  Filled 2016-12-18: qty 2

## 2016-12-18 MED ORDER — DIPHENHYDRAMINE HCL 25 MG PO CAPS: 25.0000 mg | ORAL_CAPSULE | Freq: Four times a day (QID) | ORAL | Status: DC | PRN

## 2016-12-18 MED ORDER — CALCIUM CARBONATE-VITAMIN D 500-200 MG-UNIT PO TABS
1.0000 | ORAL_TABLET | Freq: Two times a day (BID) | ORAL | Status: DC
  Administered 2016-12-18 – 2016-12-21 (×5): 1 via ORAL
  Filled 2016-12-18 (×6): qty 1

## 2016-12-18 MED ORDER — FENTANYL 12 MCG/HR TD PT72
12.5000 ug | MEDICATED_PATCH | TRANSDERMAL | Status: DC
  Administered 2016-12-19: 12.5 ug via TRANSDERMAL
  Filled 2016-12-18: qty 1

## 2016-12-18 MED ORDER — LACTATED RINGERS IV SOLN
INTRAVENOUS | Status: DC | PRN
  Administered 2016-12-18: 11:00:00 via INTRAVENOUS

## 2016-12-18 MED ORDER — METOCLOPRAMIDE HCL 5 MG/ML IJ SOLN: 5.0000 mg | Freq: Three times a day (TID) | INTRAMUSCULAR | Status: DC | PRN

## 2016-12-18 SURGICAL SUPPLY — 69 items
BANDAGE ACE 3X5.8 VEL STRL LF (GAUZE/BANDAGES/DRESSINGS) ×2 IMPLANT
BANDAGE ACE 4X5 VEL STRL LF (GAUZE/BANDAGES/DRESSINGS) ×2 IMPLANT
BIT DRILL 2.2 SS TIBIAL (BIT) ×1 IMPLANT
BLADE CLIPPER SURG (BLADE) IMPLANT
BNDG CMPR 9X4 STRL LF SNTH (GAUZE/BANDAGES/DRESSINGS) ×1
BNDG ELASTIC 2X5.8 VLCR STR LF (GAUZE/BANDAGES/DRESSINGS) ×1 IMPLANT
BNDG ESMARK 4X9 LF (GAUZE/BANDAGES/DRESSINGS) ×2 IMPLANT
BNDG GAUZE ELAST 4 BULKY (GAUZE/BANDAGES/DRESSINGS) ×2 IMPLANT
CANISTER SUCT 3000ML PPV (MISCELLANEOUS) ×2 IMPLANT
CORDS BIPOLAR (ELECTRODE) ×2 IMPLANT
COVER SURGICAL LIGHT HANDLE (MISCELLANEOUS) ×2 IMPLANT
CUFF TOURNIQUET SINGLE 18IN (TOURNIQUET CUFF) ×2 IMPLANT
CUFF TOURNIQUET SINGLE 24IN (TOURNIQUET CUFF) IMPLANT
DECANTER SPIKE VIAL GLASS SM (MISCELLANEOUS) ×2 IMPLANT
DRAPE OEC MINIVIEW 54X84 (DRAPES) ×2 IMPLANT
DRAPE SURG 17X11 SM STRL (DRAPES) ×2 IMPLANT
DRSG ADAPTIC 3X8 NADH LF (GAUZE/BANDAGES/DRESSINGS) ×2 IMPLANT
DRSG EMULSION OIL 3X3 NADH (GAUZE/BANDAGES/DRESSINGS) ×1 IMPLANT
GAUZE SPONGE 4X4 12PLY STRL (GAUZE/BANDAGES/DRESSINGS) ×2 IMPLANT
GAUZE SPONGE 4X4 12PLY STRL LF (GAUZE/BANDAGES/DRESSINGS) ×1 IMPLANT
GAUZE SPONGE 4X4 16PLY XRAY LF (GAUZE/BANDAGES/DRESSINGS) ×2 IMPLANT
GLOVE BIOGEL PI IND STRL 8.5 (GLOVE) ×1 IMPLANT
GLOVE BIOGEL PI INDICATOR 8.5 (GLOVE) ×1
GLOVE SURG ORTHO 8.0 STRL STRW (GLOVE) ×2 IMPLANT
GOWN STRL REUS W/ TWL LRG LVL3 (GOWN DISPOSABLE) ×1 IMPLANT
GOWN STRL REUS W/ TWL XL LVL3 (GOWN DISPOSABLE) ×1 IMPLANT
GOWN STRL REUS W/TWL LRG LVL3 (GOWN DISPOSABLE) ×2
GOWN STRL REUS W/TWL XL LVL3 (GOWN DISPOSABLE) ×2
K-WIRE .62 (WIRE) ×1 IMPLANT
K-WIRE 1.6 (WIRE) ×4
K-WIRE FX5X1.6XNS BN SS (WIRE) ×2
KIT BASIN OR (CUSTOM PROCEDURE TRAY) ×2 IMPLANT
KIT ROOM TURNOVER OR (KITS) ×2 IMPLANT
KWIRE FX5X1.6XNS BN SS (WIRE) IMPLANT
NDL HYPO 25X1 1.5 SAFETY (NEEDLE) ×1 IMPLANT
NEEDLE HYPO 25X1 1.5 SAFETY (NEEDLE) ×2 IMPLANT
NS IRRIG 1000ML POUR BTL (IV SOLUTION) ×2 IMPLANT
PACK ORTHO EXTREMITY (CUSTOM PROCEDURE TRAY) ×2 IMPLANT
PAD ARMBOARD 7.5X6 YLW CONV (MISCELLANEOUS) ×4 IMPLANT
PAD CAST 3X4 CTTN HI CHSV (CAST SUPPLIES) IMPLANT
PAD CAST 4YDX4 CTTN HI CHSV (CAST SUPPLIES) ×1 IMPLANT
PADDING CAST COTTON 3X4 STRL (CAST SUPPLIES) ×2
PADDING CAST COTTON 4X4 STRL (CAST SUPPLIES) ×2
PADDING UNDERCAST 2 STRL (CAST SUPPLIES) ×1
PADDING UNDERCAST 2X4 STRL (CAST SUPPLIES) IMPLANT
PEG LOCKING SMOOTH 2.2X16 (Screw) ×1 IMPLANT
PEG LOCKING SMOOTH 2.2X18 (Peg) ×3 IMPLANT
PEG LOCKING SMOOTH 2.2X20 (Screw) ×1 IMPLANT
PLATE STANDARD DVR LEFT (Plate) ×2 IMPLANT
PLATE STD DVR LT 24X51 (Plate) IMPLANT
PUTTY DBM STAGRAFT PLUS 5CC (Putty) ×1 IMPLANT
SCREW LOCK 12X2.7X 3 LD (Screw) IMPLANT
SCREW LOCK 16X2.7X 3 LD TPR (Screw) IMPLANT
SCREW LOCKING 2.7X12MM (Screw) ×4 IMPLANT
SCREW LOCKING 2.7X13MM (Screw) ×3 IMPLANT
SCREW LOCKING 2.7X16 (Screw) ×2 IMPLANT
SCREW MULTI DIRECTIONAL 2.7X16 (Screw) ×1 IMPLANT
SOAP 2 % CHG 4 OZ (WOUND CARE) ×2 IMPLANT
SPLINT FIBERGLASS 3X35 (CAST SUPPLIES) ×1 IMPLANT
SPONGE LAP 4X18 X RAY DECT (DISPOSABLE) ×2 IMPLANT
SUT PROLENE 4 0 PS 2 18 (SUTURE) ×2 IMPLANT
SUT VIC AB 2-0 FS1 27 (SUTURE) ×1 IMPLANT
SUT VICRYL 4-0 PS2 18IN ABS (SUTURE) ×1 IMPLANT
SYR CONTROL 10ML LL (SYRINGE) IMPLANT
TOWEL OR 17X24 6PK STRL BLUE (TOWEL DISPOSABLE) ×2 IMPLANT
TOWEL OR 17X26 10 PK STRL BLUE (TOWEL DISPOSABLE) ×2 IMPLANT
TUBE CONNECTING 12X1/4 (SUCTIONS) ×2 IMPLANT
WATER STERILE IRR 1000ML POUR (IV SOLUTION) ×2 IMPLANT
YANKAUER SUCT BULB TIP NO VENT (SUCTIONS) IMPLANT

## 2016-12-18 NOTE — Consult Note (Signed)
Reason for Consult: Left distal radius fracture Referring Physician: Dr. Alcario Drought. [facility] hospitalist service  [name] is an 83 y.o. female.  HPI: [name] is a 83 y.o. female with medical history significant of Arthritis, fibromyalgia, HTN.  Patient presents to the ED after a mechanical fall at home.  She tripped and landed on L wrist.  Immediate L wrist pain.  Patient uses Schmid at baseline. I was consult to for the management of her left comminuted distal radius fracture  Past Medical History:  Diagnosis Date  . Anxiety   . Arthritis    osteoarthritis  . Asthma   . C. difficile diarrhea   . Candida esophagitis ([place])   . Chronic sinusitis   . Depression   . Depression   . Diverticulosis   . DJD (degenerative joint disease)     L5 compression fracture  . Edema extremities   . Fibromyalgia   . GERD (gastroesophageal reflux disease)   . Gout   . Hyperlipidemia   . Hypertension   . Osteoarthritis   . Osteopenia   . Panic disorder   . Renal failure, unspecified   . Spinal stenosis of lumbar region   . Tubular adenoma of colon     Past Surgical History:  Procedure Laterality Date  . ADENOIDECTOMY    . CARPAL TUNNEL RELEASE  2007, 2009   Bilateral  . CATARACT EXTRACTION, BILATERAL    . CHOANAL ADENIODECTOMY    . DIAGNOSTIC LARYNGOSCOPY N/A 11/05/2015   Procedure: DIAGNOSTIC LARYNGOSCOPY AND ESOPHAGOSCOPY;  Surgeon: Rozetta Nunnery, MD;  Location: [facility];  Service: ENT;  Laterality: N/A;  . ganglion cyct removal on fingers     right  . ganglion cyst  wrist    . KNEE ARTHROSCOPY     right  . NASAL SINUS SURGERY    . SHOULDER SURGERY  2004, 07/30/10, 01/2011   after her right humeral neck fracture, revision hemiarthroplasty 2004 for persistent pain, revision reverse arthroplasty December 2011 for persistent pain, incision and drainage with poly-exchange 01/26/2011 for possible prosthetic joint infection.  . TONSILLECTOMY AND ADENOIDECTOMY    . TOTAL  ABDOMINAL HYSTERECTOMY    . TOTAL HIP ARTHROPLASTY  2004   right  . TOTAL KNEE ARTHROPLASTY  2006   right  . TOTAL SHOULDER REPLACEMENT  2002   right    Family History  Problem Relation Age of Onset  . Bladder Cancer Father   . Diabetes Mother   . Hypertension Mother   . Breast cancer Unknown   . Colon polyps Unknown   . Depression Son     Social History:  reports that she has never smoked. She has never used smokeless tobacco. She reports that she does not drink alcohol or use drugs.  Allergies:  Allergies  Allergen Reactions  . Bee Venom Anaphylaxis    Has epi-pen  . Ceftin [Cefuroxime Axetil] Anaphylaxis  . Ciprofloxacin Anaphylaxis and Palpitations  . Ephedrine Other (See Comments) and Palpitations    "knocks me out"  . Sulfonamide Derivatives Anaphylaxis  . Telithromycin Anaphylaxis    Reaction: also blurred vision   . Valsartan Anaphylaxis  . Verapamil Anaphylaxis  . Venlafaxine Other (See Comments)    Insomnia, panic. Pt takes dexvenlafaxine at home  . Beta Adrenergic Blockers     bradycardia  . Cephalosporins     Allergic Reaction  . Clindamycin/Lincomycin     Dry skin and itching/ Cdiff  . Cymbalta [Duloxetine Hcl] Other (See Comments)  Reaction:Confusion and " I fall down"  . Doxazosin     "blurred vision and irritable"  . Gabapentin Other (See Comments)    Reaction: headache  . Hydralazine     HA, Diarrhea  . Lamotrigine     Patient unsure at this time  . Mirtazapine Other (See Comments)    unknown  . Morphine Other (See Comments)    Medication has no effect with pain  . Oxycodone-Acetaminophen Hives    Takes plain oxy IR at home (May 2018)  . Pregabalin Swelling  . Prochlorperazine Edisylate   . Pseudoephedrine Other (See Comments)    Reaction: hyperventilates and blacks out  . Relafen [Nabumetone] Swelling  . Zoloft [Sertraline] Other (See Comments)    "nervous/jittery"  . Doxycycline Hives    Medications: I have reviewed the  patient's current medications.  Results for orders placed or performed during the hospital encounter of 12/17/16 (from the past 48 hour(s))  Basic metabolic panel     Status: Abnormal   Collection Time: 12/18/16 12:10 AM  Result Value Ref Range   Sodium 133 (L) 135 - 145 mmol/L   Potassium 3.6 3.5 - 5.1 mmol/L   Chloride 96 (L) 101 - 111 mmol/L   CO2 24 22 - 32 mmol/L   Glucose, Bld 123 (H) 65 - 99 mg/dL   BUN 15 6 - 20 mg/dL   Creatinine, Ser 1.32 (H) 0.44 - 1.00 mg/dL   Calcium 8.8 (L) 8.9 - 10.3 mg/dL   GFR calc non Af Amer 37 (L) >60 mL/min   GFR calc Af Amer 43 (L) >60 mL/min    Comment: (NOTE) The eGFR has been calculated using the CKD EPI equation. This calculation has not been validated in all clinical situations. eGFR's persistently <60 mL/min signify possible Chronic Kidney Disease.    Anion gap 13 5 - 15  CBC with Differential     Status: Abnormal   Collection Time: 12/18/16 12:10 AM  Result Value Ref Range   WBC 17.4 (H) 4.0 - 10.5 K/uL   RBC 4.72 3.87 - 5.11 MIL/uL   Hemoglobin 14.2 12.0 - 15.0 g/dL   HCT 42.7 36.0 - 46.0 %   MCV 90.5 78.0 - 100.0 fL   MCH 30.1 26.0 - 34.0 pg   MCHC 33.3 30.0 - 36.0 g/dL   RDW 13.1 11.5 - 15.5 %   Platelets 314 150 - 400 K/uL   Neutrophils Relative % 80 %   Neutro Abs 13.9 (H) 1.7 - 7.7 K/uL   Lymphocytes Relative 12 %   Lymphs Abs 2.1 0.7 - 4.0 K/uL   Monocytes Relative 8 %   Monocytes Absolute 1.4 (H) 0.1 - 1.0 K/uL   Eosinophils Relative 0 %   Eosinophils Absolute 0.0 0.0 - 0.7 K/uL   Basophils Relative 0 %   Basophils Absolute 0.0 0.0 - 0.1 K/uL  Protime-INR     Status: None   Collection Time: 12/18/16 12:10 AM  Result Value Ref Range   Prothrombin Time 14.4 11.4 - 15.2 seconds   INR 1.11     Dg Forearm Left  Result Date: 12/18/2016 CLINICAL DATA:  Deformity of the left distal forearm after fall. EXAM: LEFT FOREARM - 2 VIEW COMPARISON:  None. FINDINGS: Acute, closed, dorsally angulated distal radius and ulnar  metaphyseal fractures are identified with comminuted appearance of the distal radius fracture fragment. The radius fractures extend into the distal radioulnar and radiocarpal joints with slight dorsal displacement of the main fracture fragment. There  is slight widening of the distal radioulnar joint as result of the fractures. Osteoarthritic joint space narrowing of the first CMC and triscaphe articulations of the wrist. Soft tissue swelling of the distal forearm and wrist. The elbow joint is maintained. IMPRESSION: 1. Acute, comminuted intra-articular dorsally angulated fracture of the distal radial metaphysis and epiphysis extending into the radiocarpal and distal radioulnar joints. 2. Acute, closed dorsal and radial angulated fracture of the ulnar metaphysis. 3. Slight widened appearance of the distal radioulnar joint with slight foreshortened appearance the radius due to the fractures. 4. Osteoarthritis of the first [facility] and triscaphe joints. 5. Intact elbow joint with soft tissue swelling of the distal forearm and wrist. Electronically Signed   By: Ashley Royalty M.D.   On: 12/18/2016 00:21   Dg Wrist Complete Left  Result Date: 12/18/2016 CLINICAL DATA:  Wrist deformity after fall. EXAM: LEFT WRIST - COMPLETE 3+ VIEW COMPARISON:  None. FINDINGS: Acute, closed, dorsally angulated distal radius and ulnar metaphyseal fractures are identified with comminuted appearance of the distal radius fracture fragment. The radius fractures extend into the distal radioulnar and radiocarpal joints with slight dorsal displacement of the main fracture fragment. There is slight widening of the distal radioulnar joint as result of the fractures. Osteoarthritic joint space narrowing of the first CMC and triscaphe articulations of the wrist. Moderate soft tissue swelling is noted about the wrist and distal forearm. IMPRESSION: 1. Acute, comminuted intra-articular dorsally angulated fracture of the distal radial metaphysis and  epiphysis extending into the radiocarpal and distal radioulnar joints. 2. Acute, closed dorsal and radial angulated fracture of the ulnar metaphysis. 3. Slight widened appearance of the distal radioulnar joint with slight foreshortened appearance the radius due to the fractures. 4. Osteoarthritis of the first [facility] and triscaphe joints. Electronically Signed   By: Ashley Royalty M.D.   On: 12/18/2016 00:22    ROS: No recent illnesses or hospitalizations Blood pressure 126/61, pulse 94, temperature 97.7 F (36.5 C), temperature source Oral, resp. rate 17, SpO2 92 %. Physical Exam  General Appearance:  Alert, cooperative, no distress, appears stated age  Head:  Normocephalic, without obvious abnormality, atraumatic  Eyes:  Pupils equal, conjunctiva/corneas clear,         Throat: Lips, mucosa, and tongue normal; teeth and gums normal  Neck: No visible masses     Lungs:   respirations unlabored  Chest Wall:  No tenderness or deformity  Heart:  Regular rate and rhythm,  Abdomen:   Soft, non-tender,         Extremities: The left upper extremity is in a sling. She is in a sugar tong splint. She is able to gently wiggle her fingers her fingers are warm and well perfused   Pulses: 2+ and symmetric  Skin: Skin color, texture, turgor normal, no rashes or lesions     Neurologic: Normal   Assessment/Plan: Left wrist comminuted intra-articular distal radius fracture of 3 or more fragments Advanced left thumb arthritis and wrist arthritis  Open reduction and internal fixation and stabilization of a comminuted intra-articular distal radius fracture  R/B/A DISCUSSED WITH PT AND [place].  PT VOICED UNDERSTANDING OF PLAN CONSENT SIGNED DAY OF SURGERY PT SEEN AND EXAMINED PRIOR TO OPERATIVE PROCEDURE/DAY OF SURGERY SITE MARKED. QUESTIONS ANSWERED WILL REMAIN AN INPATIENT FOLLOWING SURGERY  WE ARE PLANNING SURGERY FOR YOUR UPPER EXTREMITY. THE RISKS AND BENEFITS OF SURGERY INCLUDE  BUT NOT LIMITED TO BLEEDING INFECTION, DAMAGE TO NEARBY NERVES ARTERIES TENDONS, FAILURE OF  SURGERY TO ACCOMPLISH ITS INTENDED GOALS, PERSISTENT SYMPTOMS AND NEED FOR FURTHER SURGICAL INTERVENTION. WITH THIS IN MIND WE WILL PROCEED. I HAVE DISCUSSED WITH THE PATIENT THE PRE AND POSTOPERATIVE REGIMEN AND THE DOS AND DON'TS. PT VOICED UNDERSTANDING AND INFORMED CONSENT SIGNED.  Linna Hoff 12/18/2016, 10:11 AM

## 2016-12-18 NOTE — Progress Notes (Addendum)
[facility] PROGRESS NOTE  [name] GDJ:[id number] DOB: 1932-10-23 DOA: 12/17/2016  PCP: Gregor Hams, MD  Brief History/Interval Summary: 83 year old Caucasian female with a past medical history of chronic back pain, arthritis, fibromyalgia, hypertension, presented to the emergency department after sustaining a mechanical fall at home. This resulted in fracture of her left distal radius. She was hospitalized for further management.  Reason for Visit: Left distal radial fracture  Consultants: Orthopedics  Procedures: Plan is for open reduction and internal fixation and stabilization of the intra-articular distal radial fracture  Antibiotics: None  Subjective/Interval History: Patient complains of pain in her left wrist area. Denies any shortness of breath or chest pain.  ROS: No nausea or vomiting.  Objective:  Vital Signs  Vitals:   12/18/16 0015 12/18/16 0030 12/18/16 0140 12/18/16 0521  BP: 129/75 120/69 126/68 126/61  Pulse: (!) 110 (!) 108 (!) 101 94  Resp: 13 17    Temp:   98.6 F (37 C) 97.7 F (36.5 C)  TempSrc:   Oral Oral  SpO2: 94% 98% 94% 92%   No intake or output data in the 24 hours ending 12/18/16 1209 There were no vitals filed for this visit.  General appearance: alert, cooperative, appears stated age and no distress. In some discomfort. Resp: clear to auscultation bilaterally Cardio: regular rate and rhythm, S1, S2 normal, no murmur, click, rub or gallop GI: soft, non-tender; bowel sounds normal; no masses,  no organomegaly Extremities: Left arm is in a sling. Good capillary refill Neurologic: Awake, alert. Somewhat anxious. No focal neurological deficits  Lab Results:  Data Reviewed: I have personally reviewed following labs and imaging studies  CBC:  Recent Labs Lab 12/18/16 0010  WBC 17.4*  NEUTROABS 13.9*  HGB 14.2  HCT 42.7  MCV 90.5  PLT 622    Basic Metabolic Panel:  Recent Labs Lab 12/18/16 0010  NA 133*    K 3.6  CL 96*  CO2 24  GLUCOSE 123*  BUN 15  CREATININE 1.32*  CALCIUM 8.8*    Coagulation Profile:  Recent Labs Lab 12/18/16 0010  INR 1.11     Radiology Studies: Dg Forearm Left  Result Date: 12/18/2016 CLINICAL DATA:  Deformity of the left distal forearm after fall. EXAM: LEFT FOREARM - 2 VIEW COMPARISON:  None. FINDINGS: Acute, closed, dorsally angulated distal radius and ulnar metaphyseal fractures are identified with comminuted appearance of the distal radius fracture fragment. The radius fractures extend into the distal radioulnar and radiocarpal joints with slight dorsal displacement of the main fracture fragment. There is slight widening of the distal radioulnar joint as result of the fractures. Osteoarthritic joint space narrowing of the first CMC and triscaphe articulations of the wrist. Soft tissue swelling of the distal forearm and wrist. The elbow joint is maintained. IMPRESSION: 1. Acute, comminuted intra-articular dorsally angulated fracture of the distal radial metaphysis and epiphysis extending into the radiocarpal and distal radioulnar joints. 2. Acute, closed dorsal and radial angulated fracture of the ulnar metaphysis. 3. Slight widened appearance of the distal radioulnar joint with slight foreshortened appearance the radius due to the fractures. 4. Osteoarthritis of the first [facility] and triscaphe joints. 5. Intact elbow joint with soft tissue swelling of the distal forearm and wrist. Electronically Signed   By: Ashley Royalty M.D.   On: 12/18/2016 00:21   Dg Wrist Complete Left  Result Date: 12/18/2016 CLINICAL DATA:  Wrist deformity after fall. EXAM: LEFT WRIST - COMPLETE 3+ VIEW COMPARISON:  None. FINDINGS:  Acute, closed, dorsally angulated distal radius and ulnar metaphyseal fractures are identified with comminuted appearance of the distal radius fracture fragment. The radius fractures extend into the distal radioulnar and radiocarpal joints with slight dorsal  displacement of the main fracture fragment. There is slight widening of the distal radioulnar joint as result of the fractures. Osteoarthritic joint space narrowing of the first CMC and triscaphe articulations of the wrist. Moderate soft tissue swelling is noted about the wrist and distal forearm. IMPRESSION: 1. Acute, comminuted intra-articular dorsally angulated fracture of the distal radial metaphysis and epiphysis extending into the radiocarpal and distal radioulnar joints. 2. Acute, closed dorsal and radial angulated fracture of the ulnar metaphysis. 3. Slight widened appearance of the distal radioulnar joint with slight foreshortened appearance the radius due to the fractures. 4. Osteoarthritis of the first [facility] and triscaphe joints. Electronically Signed   By: Ashley Royalty M.D.   On: 12/18/2016 00:22     Medications:  Scheduled: . [MAR Hold] amLODipine  10 mg Oral Daily  . [MAR Hold] calcium-vitamin D  1 tablet Oral BID  . [MAR Hold] diclofenac sodium  1 application Topical QID  . [MAR Hold] fentaNYL  12.5 mcg Transdermal Q72H  . [MAR Hold] hydrochlorothiazide  25 mg Oral Daily  . [MAR Hold] montelukast  10 mg Oral QHS  . [MAR Hold] potassium chloride SA  20 mEq Oral Daily  . [MAR Hold] simvastatin  10 mg Oral q1800  . [MAR Hold] venlafaxine XR  37.5 mg Oral Q breakfast   Continuous: . acetaminophen    . vancomycin     PRN:0.9 % irrigation (POUR BTL), [MAR Hold] ALPRAZolam, [MAR Hold] diphenhydrAMINE, [MAR Hold] ipratropium, [MAR Hold] ondansetron, [MAR Hold] oxyCODONE  Assessment/Plan:  Active Problems:   Fracture of left wrist    Fracture of the left distal radius Result of a mechanical fall. Patient is currently in a sling. Await orthopedic input. Might need operative intervention. Pain control. She is noted to be on long acting pain medications for her chronic back pains including fentanyl patch and OxyIR. Will need physical and occupational therapy. Lives with her husband who  apparently is also debilitated. Patient may need to go to skilled nursing facility for short-term rehabilitation.  Chronic pain syndrome. Patient has pain due to arthritis, she has chronic back pain. Followed by pain specialist. She is on a fentanyl patch along with OxyIR. These will be continued for now.  Essential hypertension. Continue home medications. Monitor blood pressures closely.  History of depression and anxiety. Continue venlafaxine and alprazolam.  Leukocytosis She is afebrile. No obvious source of infection. Denies any difficulty with urinating. Could be reactive. Continue to monitor.  DVT Prophylaxis: SCDs    Code Status: Full code  Family Communication: Discussed with the patient  Disposition Plan: Management as outlined above.    LOS: 0 days   [place] [facility] Pager [phone] 12/18/2016, 12:09 PM  If 7PM-7AM, please contact night-coverage at [email or web address], password [facility]

## 2016-12-18 NOTE — ED Provider Notes (Signed)
Consult to hand - Dr. Caralyn Guile - regarding left wrist fracture. He advises keeping her NPO and he will consult in the hospital later today (12/18/16)   Charlann Lange, PA-C 12/18/16 0115    Charlesetta Shanks, MD 12/19/16 1224

## 2016-12-18 NOTE — Progress Notes (Addendum)
PT Cancellation Note  Patient Details Name: [name] MRN: [id number] DOB: 1932-12-09   Cancelled Treatment:    Reason Eval/Treat Not Completed: Other (comment)   Pt currently at [facility] (Short Stay?), speaking with family, and Dr. Caralyn Guile pre-op;   Considering pt was dependent on bilateral UE support, would like to gait train with L platform RW, with pt getting support through the elbow and not the wrist;   Please advise: will it be ok to bear weight through the elbow?  Will follow up later today as time allows;  Otherwise, will follow up for PT tomorrow;   Thank you,  Roney Marion, PT  Acute Rehabilitation Services Pager [phone] Office [place] 12/18/2016, 10:15 AM

## 2016-12-18 NOTE — Anesthesia Postprocedure Evaluation (Signed)
Anesthesia Post Note  Patient: [name]  Procedure(s) Performed: Procedure(s) (LRB): OPEN REDUCTION INTERNAL FIXATION (ORIF) DISTAL RADIAL FRACTURE (Left)  Patient location during evaluation: PACU Anesthesia Type: Regional and General Level of consciousness: sedated and patient cooperative Pain management: pain level controlled Vital Signs Assessment: post-procedure vital signs reviewed and stable Respiratory status: spontaneous breathing Cardiovascular status: stable Anesthetic complications: no       Last Vitals:  Vitals:   12/18/16 1245 12/18/16 1315  BP: 125/70 134/73  Pulse: 95 98  Resp: 11 17  Temp:  36.4 C    Last Pain:  Vitals:   12/18/16 1215  TempSrc:   PainSc: [place]

## 2016-12-18 NOTE — Anesthesia Preprocedure Evaluation (Addendum)
Anesthesia Evaluation  Patient identified by MRN, date of birth, ID band Patient awake    Reviewed: Allergy & Precautions, NPO status , Patient's Chart, lab work & pertinent test results  Airway Mallampati: II  TM Distance: >3 FB Neck ROM: Full    Dental no notable dental hx.    Pulmonary asthma ,    Pulmonary exam normal breath sounds clear to auscultation       Cardiovascular hypertension, Pt. on medications Normal cardiovascular exam Rhythm:Regular Rate:Normal     Neuro/Psych negative neurological ROS  negative psych ROS   GI/Hepatic negative GI ROS, Neg liver ROS,   Endo/Other  negative endocrine ROS  Renal/GU Renal diseasenegative Renal ROS     Musculoskeletal  (+) Fibromyalgia -  Abdominal   Peds negative pediatric ROS (+)  Hematology negative hematology ROS (+)   Anesthesia Other Findings   Reproductive/Obstetrics negative OB ROS                             Anesthesia Physical  Anesthesia Plan  ASA: III  Anesthesia Plan: General   Post-op Pain Management:  Regional for Post-op pain   Induction: Intravenous  Airway Management Planned: LMA  Additional Equipment:   Intra-op Plan:   Post-operative Plan: Extubation in OR  Informed Consent: I have reviewed the patients History and Physical, chart, labs and discussed the procedure including the risks, benefits and alternatives for the proposed anesthesia with the patient or authorized representative who has indicated his/her understanding and acceptance.   Dental advisory given  Plan Discussed with: CRNA  Anesthesia Plan Comments:        Anesthesia Quick Evaluation

## 2016-12-18 NOTE — Transfer of Care (Signed)
Immediate Anesthesia Transfer of Care Note  Patient: [name]  Procedure(s) Performed: Procedure(s): OPEN REDUCTION INTERNAL FIXATION (ORIF) DISTAL RADIAL FRACTURE (Left)  Patient Location: PACU  Anesthesia Type:General and GA combined with regional for post-op pain  Level of Consciousness: awake, alert , sedated and responds to stimulation  Airway & Oxygen Therapy: Patient Spontanous Breathing and Patient connected to nasal cannula oxygen  Post-op Assessment: Report given to RN, Post -op Vital signs reviewed and stable and Patient moving all extremities  Post vital signs: Reviewed and stable  Last Vitals:  Vitals:   12/18/16 0521 12/18/16 1215  BP: 126/61 (P) 138/72  Pulse: 94 (P) 96  Resp:    Temp: 36.5 C (P) 36.1 C    Last Pain:  Vitals:   12/18/16 0521  TempSrc: Oral  PainSc:          Complications: No apparent anesthesia complications

## 2016-12-18 NOTE — Anesthesia Procedure Notes (Signed)
Procedure Name: LMA Insertion Date/Time: 12/18/2016 11:00 AM Performed by: Greggory Stallion, Oluwademilade Mckiver L Pre-anesthesia Checklist: Patient identified, Emergency Drugs available, Suction available and Patient being monitored Patient Re-evaluated:Patient Re-evaluated prior to inductionOxygen Delivery Method: Circle System Utilized Preoxygenation: Pre-oxygenation with 100% oxygen Intubation Type: IV induction Ventilation: Mask ventilation without difficulty LMA: LMA inserted LMA Size: 4.0 Number of attempts: 1 Airway Equipment and Method: Bite block Placement Confirmation: positive ETCO2 Tube secured with: Tape Dental Injury: Teeth and Oropharynx as per pre-operative assessment

## 2016-12-18 NOTE — Progress Notes (Signed)
Orthopedic Tech Progress Note Patient Details:  [name] 19-May-1933 [id number]  Ortho Devices Type of Ortho Device: Arm sling, [place] splint Ortho Device/Splint Location: lue Ortho Device/Splint Interventions: Ordered, Application   Karolee Stamps 12/18/2016, 1:18 AM

## 2016-12-18 NOTE — Progress Notes (Addendum)
Dr. Vanessa Ralphs and spoke with pt, pt states she doesn't want surgery dr called daughter and they are to meet pt in short stay and they will review the procedure with family

## 2016-12-18 NOTE — Anesthesia Procedure Notes (Signed)
Anesthesia Regional Block: Axillary brachial plexus block   Pre-Anesthetic Checklist: ,, timeout performed, Correct Patient, Correct Site, Correct Laterality, Correct Procedure, Correct Position, site marked, Risks and benefits discussed,  Surgical consent,  Pre-op evaluation,  At surgeon's request and post-op pain management  Laterality: Left  Prep: chloraprep       Needles:  Injection technique: Single-shot  Needle Type: Stimulator Needle - 40     Needle Length: 4cm  Needle Gauge: 22     Additional Needles:   Procedures:, nerve stimulator,,,,,,,  Narrative:  Start time: 12/18/2016 10:40 AM End time: 12/18/2016 10:45 AM Injection made incrementally with aspirations every 5 mL. Anesthesiologist: Nolon Nations  Additional Notes: BP cuff, EKG monitors applied. Sedation begun. Nerve location verified with U/S. Anesthetic injected incrementally, slowly , and after neg aspirations under direct u/s guidance. Good perineural spread. Tolerated well.

## 2016-12-18 NOTE — Progress Notes (Addendum)
   12/18/16 1200  OT Visit Information  Last OT Received On 12/18/16  Reason Eval/Treat Not Completed Patient at procedure or test/ unavailable;Other (comment) (in OR)  History of Present Illness Admitted with L wrist fx after fall landing on L wrist; PMH arthritis, HTN, fibromyalgia; uses a [place] at baseline   Pt is currently in OR. Spoke with Dr. Caralyn Guile by phone, she will be ok to WB through elbow. Will reattempt tomorrow.   Tyrone Schimke OTR/L Pager: [phone]

## 2016-12-18 NOTE — H&P (Addendum)
History and Physical    [name]:[id number] DOB: November 03, 1932 DOA: 12/17/2016  PCP: Gregor Hams, MD  Patient coming from: Home  I have personally briefly reviewed patient's old medical records in [place]  Chief Complaint: Wrist pain  HPI: [name] is a 83 y.o. female with medical history significant of Arthritis, fibromyalgia, HTN.  Patient presents to the ED after a mechanical fall at home.  She tripped and landed on L wrist.  Immediate L wrist pain.  Patient uses [place] at baseline.   ED Course: L wrist fracture.   Review of Systems: As per HPI otherwise 10 point review of systems negative.   Past Medical History:  Diagnosis Date  . Anxiety   . Arthritis    osteoarthritis  . Asthma   . C. difficile diarrhea   . Candida esophagitis ([place])   . Chronic sinusitis   . Depression   . Depression   . Diverticulosis   . DJD (degenerative joint disease)     L5 compression fracture  . Edema extremities   . Fibromyalgia   . GERD (gastroesophageal reflux disease)   . Gout   . Hyperlipidemia   . Hypertension   . Osteoarthritis   . Osteopenia   . Panic disorder   . Renal failure, unspecified   . Spinal stenosis of lumbar region   . Tubular adenoma of colon     Past Surgical History:  Procedure Laterality Date  . ADENOIDECTOMY    . CARPAL TUNNEL RELEASE  2007, 2009   Bilateral  . CATARACT EXTRACTION, BILATERAL    . CHOANAL ADENIODECTOMY    . DIAGNOSTIC LARYNGOSCOPY N/A 11/05/2015   Procedure: DIAGNOSTIC LARYNGOSCOPY AND ESOPHAGOSCOPY;  Surgeon: Rozetta Nunnery, MD;  Location: [facility];  Service: ENT;  Laterality: N/A;  . ganglion cyct removal on fingers     right  . ganglion cyst  wrist    . KNEE ARTHROSCOPY     right  . NASAL SINUS SURGERY    . SHOULDER SURGERY  2004, 07/30/10, 01/2011   after her right humeral neck fracture, revision hemiarthroplasty 2004 for persistent pain, revision reverse arthroplasty December 2011 for persistent  pain, incision and drainage with poly-exchange 01/26/2011 for possible prosthetic joint infection.  . TONSILLECTOMY AND ADENOIDECTOMY    . TOTAL ABDOMINAL HYSTERECTOMY    . TOTAL HIP ARTHROPLASTY  2004   right  . TOTAL KNEE ARTHROPLASTY  2006   right  . TOTAL SHOULDER REPLACEMENT  2002   right     reports that she has never smoked. She has never used smokeless tobacco. She reports that she does not drink alcohol or use drugs.  Allergies  Allergen Reactions  . Bee Venom Anaphylaxis    Has epi-pen  . Ceftin [Cefuroxime Axetil] Anaphylaxis  . Ciprofloxacin Anaphylaxis and Palpitations  . Ephedrine Other (See Comments) and Palpitations    "knocks me out"  . Sulfonamide Derivatives Anaphylaxis  . Telithromycin Anaphylaxis    Reaction: also blurred vision   . Valsartan Anaphylaxis  . Verapamil Anaphylaxis  . Venlafaxine Other (See Comments)    Insomnia, panic  . Beta Adrenergic Blockers     bradycardia  . Cephalosporins     Allergic Reaction  . Clindamycin/Lincomycin     Dry skin and itching/ Cdiff  . Cymbalta [Duloxetine Hcl] Other (See Comments)    Reaction:Confusion and " I fall down"  . Doxazosin     "blurred vision and irritable"  . Gabapentin Other (See  Comments)    Reaction: headache  . Hydralazine     HA, Diarrhea  . Lamotrigine     Patient unsure at this time  . Mirtazapine Other (See Comments)    unknown  . Morphine Other (See Comments)    Medication has no effect with pain  . Oxycodone-Acetaminophen Hives    Takes plain oxy IR at home (May 2018)  . Pregabalin Swelling  . Prochlorperazine Edisylate   . Pseudoephedrine Other (See Comments)    Reaction: hyperventilates and blacks out  . Relafen [Nabumetone] Swelling  . Zoloft [Sertraline] Other (See Comments)    "nervous/jittery"  . Doxycycline Hives    Family History  Problem Relation Age of Onset  . Bladder Cancer Father   . Diabetes Mother   . Hypertension Mother   . Breast cancer Unknown   .  Colon polyps Unknown   . Depression Son      Prior to Admission medications   Medication Sig Start Date End Date Taking? Authorizing Provider  ALPRAZolam Duanne Moron) 0.5 MG tablet Take 0.5 mg by mouth 2 (two) times daily.    Yes [provider]  amLODipine (NORVASC) 10 MG tablet take 1 tablet by mouth once daily 11/22/16  Yes Gregor Hams, MD  Calcium Carbonate-Vitamin D (CALCIUM 600+D) 600-400 MG-UNIT per tablet Take 1 tablet by mouth 2 (two) times daily.   Yes [provider]  diclofenac sodium (VOLTAREN) 1 % GEL Apply 1 application topically 4 (four) times daily. 11/06/15  Yes [provider]  diphenhydrAMINE (BENADRYL) 25 MG tablet Take 25 mg by mouth every 6 (six) hours as needed. For allergies   Yes [provider]  fentaNYL (DURAGESIC - DOSED MCG/HR) 12 MCG/HR 12 mcg every 3 (three) days. 11/21/16  Yes [provider]  hydrochlorothiazide (HYDRODIURIL) 25 MG tablet take 1 tablet by mouth every morning for blood pressure 10/31/16  Yes Gregor Hams, MD  ipratropium (ATROVENT) 0.06 % nasal spray Place 2 sprays into both nostrils 3 (three) times daily as needed for rhinitis. 11/25/14  Yes Hommel, Sean, DO  montelukast (SINGULAIR) 10 MG tablet take 1 tablet by mouth at bedtime 08/26/16  Yes Gregor Hams, MD  potassium chloride SA (K-DUR,KLOR-CON) 20 MEQ tablet take 1 tablet by mouth once daily 11/22/16  Yes Turner, Eber Hong, MD  simvastatin (ZOCOR) 10 MG tablet Take 1 tablet (10 mg total) by mouth daily. 07/13/16  Yes Breeback, Chauncey, PA-C  AMBULATORY NON FORMULARY MEDICATION [place].  Dx: Rheumatoid Arthritis.  Use daily as needed for ambulation. 06/17/15   Marcial Pacas, DO  EPINEPHrine (ADRENACLICK) 0.3 UE/4.5 mL IJ SOAJ injection Inject 0.3 mLs (0.3 mg total) into the muscle once. 12/30/15   Hommel, Sean, DO  escitalopram (LEXAPRO) 20 MG tablet Take 20 mg by mouth daily.    [provider]  hydrALAZINE (APRESOLINE) 25 MG tablet Take 25 mg by  mouth. Patient states her doctor instructed her to take 1-2 tabs at bedtime.    [provider]  ondansetron (ZOFRAN) 4 MG tablet Take 1-2 tablets (4-8 mg total) by mouth every 8 (eight) hours as needed for nausea or vomiting. 07/13/16   Iran Planas L, PA-C  oxyCODONE (OXY IR/ROXICODONE) 5 MG immediate release tablet take 1 tablet by mouth three times a day as needed for pain 04/06/16   [provider]  Oxycodone HCl 10 MG TABS Take 10 mg by mouth 4 (four) times daily as needed. 11/21/16   [provider]  zoledronic acid (RECLAST) 5 MG/100ML SOLN Inject 5 mg into the vein. Once yearly. In June    [provider]    Physical Exam: Vitals:   12/17/16 2253 12/17/16 2256 12/17/16 2257 12/17/16 2258  BP:  131/66    Pulse:  (!) 112    Resp:   18   Temp:    98.5 F (36.9 C)  TempSrc:    Oral  SpO2: 96% 92%      Constitutional: NAD, calm, comfortable Eyes: PERRL, lids and conjunctivae normal ENMT: Mucous membranes are moist. Posterior pharynx clear of any exudate or lesions.Normal dentition.  Neck: normal, supple, no masses, no thyromegaly Respiratory: clear to auscultation bilaterally, no wheezing, no crackles. Normal respiratory effort. No accessory muscle use.  Cardiovascular: Regular rate and rhythm, no murmurs / rubs / gallops. No extremity edema. 2+ pedal pulses. No carotid bruits.  Abdomen: no tenderness, no masses palpated. No hepatosplenomegaly. Bowel sounds positive.  Musculoskeletal: no clubbing / cyanosis. No joint deformity upper and lower extremities. Good ROM, no contractures. Normal muscle tone.  Skin: no rashes, lesions, ulcers. No induration Neurologic: CN 2-12 grossly intact. Sensation intact, DTR normal. Strength 5/5 in all 4.  Psychiatric: Normal judgment and insight. Alert and oriented x 3. Normal mood.    Labs on Admission: I have personally reviewed following labs and imaging studies  CBC:  Recent Labs Lab 12/18/16 0010  WBC  17.4*  NEUTROABS 13.9*  HGB 14.2  HCT 42.7  MCV 90.5  PLT 500   Basic Metabolic Panel: No results for input(s): NA, K, CL, CO2, GLUCOSE, BUN, CREATININE, CALCIUM, MG, PHOS in the last 168 hours. GFR: CrCl cannot be calculated (Patient's most recent lab result is older than the maximum 21 days allowed.). Liver Function Tests: No results for input(s): AST, ALT, ALKPHOS, BILITOT, PROT, ALBUMIN in the last 168 hours. No results for input(s): LIPASE, AMYLASE in the last 168 hours. No results for input(s): AMMONIA in the last 168 hours. Coagulation Profile: No results for input(s): INR, PROTIME in the last 168 hours. Cardiac Enzymes: No results for input(s): CKTOTAL, CKMB, CKMBINDEX, TROPONINI in the last 168 hours. BNP (last 3 results) No results for input(s): PROBNP in the last 8760 hours. HbA1C: No results for input(s): HGBA1C in the last 72 hours. CBG: No results for input(s): GLUCAP in the last 168 hours. Lipid Profile: No results for input(s): CHOL, HDL, LDLCALC, TRIG, CHOLHDL, LDLDIRECT in the last 72 hours. Thyroid Function Tests: No results for input(s): TSH, T4TOTAL, FREET4, T3FREE, THYROIDAB in the last 72 hours. Anemia Panel: No results for input(s): VITAMINB12, FOLATE, FERRITIN, TIBC, IRON, RETICCTPCT in the last 72 hours. Urine analysis:    Component Value Date/Time   COLORURINE YELLOW 09/22/2011 1830   APPEARANCEUR CLEAR 09/22/2011 1830   LABSPEC 1.017 09/22/2011 1830   PHURINE 6.0 09/22/2011 1830   GLUCOSEU NEGATIVE 09/22/2011 1830   HGBUR NEGATIVE 09/22/2011 1830   BILIRUBINUR neg 06/17/2016 1428   KETONESUR TRACE (A) 09/22/2011 1830   PROTEINUR neg 06/17/2016 1428   PROTEINUR NEGATIVE 09/22/2011 1830   UROBILINOGEN 1.0 06/17/2016 1428   UROBILINOGEN 0.2 09/22/2011 1830   NITRITE neg 06/17/2016 1428   NITRITE NEGATIVE 09/22/2011 1830   LEUKOCYTESUR large (3+) (A) 06/17/2016 1428    Radiological Exams on Admission: Dg Forearm Left  Result Date:  12/18/2016 CLINICAL DATA:  Deformity of the left distal forearm after fall. EXAM: LEFT FOREARM - 2 VIEW COMPARISON:  None. FINDINGS: Acute, closed, dorsally angulated distal radius and ulnar  metaphyseal fractures are identified with comminuted appearance of the distal radius fracture fragment. The radius fractures extend into the distal radioulnar and radiocarpal joints with slight dorsal displacement of the main fracture fragment. There is slight widening of the distal radioulnar joint as result of the fractures. Osteoarthritic joint space narrowing of the first CMC and triscaphe articulations of the wrist. Soft tissue swelling of the distal forearm and wrist. The elbow joint is maintained. IMPRESSION: 1. Acute, comminuted intra-articular dorsally angulated fracture of the distal radial metaphysis and epiphysis extending into the radiocarpal and distal radioulnar joints. 2. Acute, closed dorsal and radial angulated fracture of the ulnar metaphysis. 3. Slight widened appearance of the distal radioulnar joint with slight foreshortened appearance the radius due to the fractures. 4. Osteoarthritis of the first [facility] and triscaphe joints. 5. Intact elbow joint with soft tissue swelling of the distal forearm and wrist. Electronically Signed   By: Ashley Royalty M.D.   On: 12/18/2016 00:21   Dg Wrist Complete Left  Result Date: 12/18/2016 CLINICAL DATA:  Wrist deformity after fall. EXAM: LEFT WRIST - COMPLETE 3+ VIEW COMPARISON:  None. FINDINGS: Acute, closed, dorsally angulated distal radius and ulnar metaphyseal fractures are identified with comminuted appearance of the distal radius fracture fragment. The radius fractures extend into the distal radioulnar and radiocarpal joints with slight dorsal displacement of the main fracture fragment. There is slight widening of the distal radioulnar joint as result of the fractures. Osteoarthritic joint space narrowing of the first CMC and triscaphe articulations of the wrist.  Moderate soft tissue swelling is noted about the wrist and distal forearm. IMPRESSION: 1. Acute, comminuted intra-articular dorsally angulated fracture of the distal radial metaphysis and epiphysis extending into the radiocarpal and distal radioulnar joints. 2. Acute, closed dorsal and radial angulated fracture of the ulnar metaphysis. 3. Slight widened appearance of the distal radioulnar joint with slight foreshortened appearance the radius due to the fractures. 4. Osteoarthritis of the first [facility] and triscaphe joints. Electronically Signed   By: Ashley Royalty M.D.   On: 12/18/2016 00:22    EKG: Independently reviewed.  Assessment/Plan Active Problems:   Fracture of left wrist    1. Fx of left wrist - 1. EDP calling hand surgery for likely follow up, per EDP this is non-surgical I am told. 2. Patient getting put in splint / cast by ortho tech. 3. Pain control: will increase oxy IR from 10 TID prn to 10-15 Q6H PRN, continue 12.40mcg fentanyl patch 4. PT/OT/CM consults  DVT prophylaxis: SCDs Code Status: Full Family Communication: Family at bedside Disposition Plan: TBD Consults called: EDP calling hand surgery, Dr Apolonio Schneiders wants patient NPO and he will see in AM Admission status: Place in obs   [place], [facility] Pager [phone]  If 7AM-7PM, please contact day team taking care of patient [email or web address] Password [facility]  12/18/2016, 12:31 AM

## 2016-12-18 NOTE — ED Notes (Signed)
Patient is stable and ready to be transport to the floor at this time.  Report was called to 5N RN.  Belongings taken with the patient to the floor.   

## 2016-12-18 NOTE — Progress Notes (Addendum)
Pt to short stay, states she is very frighten, explain her family will meet her down stairs prior to surgery

## 2016-12-18 NOTE — Op Note (Signed)
PREOPERATIVE DIAGNOSIS: Comminuted left intra-articular distal radius fracture 3 more fragments  POSTOPERATIVE DIAGNOSIS: Same  ATTENDING SURGEON:  Dr. Iran Planas who was scrubbed and present for the entire procedure ASSISTANT SURGEON:  None ANESTHESIA:  Axillary block with general anesthesia via an LMA OPERATIVE PROCEDURE:  #1: Open treatment of left wrist intra-articular distal radius fracture 3 more fragments.  #2: Left forearm brachia radialis tendon tenotomy and tendon release   #3: Radiographs 3 views left wrist AP lateral oblique views of the wrist IMPLANTS:  Biomet standard DVR cross lock with several cc of Biomet stay graft bone graft substitute RADIOGRAPHIC INTERPRETATION:  AP lateral oblique views the wrist to show the volar plate fixation in place in good position with good alignment of the radial height length and congruity of the distal radial ulnar joint SURGICAL INDICATIONS:  [name] is a right-hand-dominant female who fell at home sustaining a closed injury to her left distal radius. Patient was seen and evaluated and based on the comminution displaced intra-articular fracture is recommended she undergo the above procedure. Risks benefits and alternatives were discussed in detail with the patient and signed informed consent was obtained. SURGICAL TECHNIQUE:  Patient was properly identified in the preoperative holding area marked for marker made on the left wrist indicate correct operative site. Patient brought back to the operating room placed supine on the operating table where general anesthesia was administered. Patient received vancomycin due to allergies to Ancef and clindamycin. A well-padded tourniquet was then placed on the left brachium and sealed with a 1000 drape. The left upper extremities and prepped and draped in normal sterile fashion. Timeout was called cracks I was identified and the procedure then begun. Attention was then turned to the left  wrist. The limb was elevated and using Esmarch exsanguination the tourniquet insufflated. A longitudinal incision made directly over the FCR sheath. Dissection was then carried down through the skin is obtaining his tissue. The FCR sheath was then swept out of the way expose the FPL. The pronator quadratus was then elevated in an L-shaped fashion. In order to reduce the radial column and reduce the multiple fracture fragments along the radial side the brachial radialis was then carefully released off the radial styloid. Tendon tenotomy was then done of the brachia radialis exposing the the highly comminuted radial side of the joint. Further dissection was then carried out reducing the ulnar side of the joint. This was an intra-articular fracture 3 or more fragments. The patient did have the metaphyseal comminution and the poor bone quality there for several cc of Biomet stay graft was then placed in the metaphyseal defect. Open reduction was then performed. The standard DVR cross lock plate was then held with a K wire. The oblong screw hole was then placed proximally in the plate height was appropriately adjusted. After adequate plate position confirmed using the mini C-arm distal fixation was then carried out from a right ulnar to radial direction with a combination distal locking pegs and multidirectional screws. Further fixation was then carried out proximally with the locking screws and bicortical shaft screws. The wound was thoroughly irrigated after thorough weren't wound irrigation final radiographs were then obtained. The pronator quadratus was then closed with 2-0 Vicryl. The subcutaneous tissues closed with 4-0 Vicryl. Skin was then closed with simple 4-0 Prolene sutures. Adaptic dressing a sterile compressive bandage was then applied. The patient was then placed in a well-padded sugar tong splint and extubated taken recovery room in good condition  POSTOPERATIVE  PLAN: Patient is to be admitted back to  the internal medicine service. She'll be weightbearing through the forearm nonweightbearing through the wrist. Ice elevation OT for digital range of motion and use of the fingers. I'll need to see her back in the office in approximately 2 weeks for x-rays out of the splint suture removal application of a short arm cast. Place the order for therapy the first postoperative visit and begin outpatient therapy regimen at the four-week mark. Radiographs at each visit.

## 2016-12-18 NOTE — ED Notes (Signed)
Ortho Tech at bedside.  

## 2016-12-19 ENCOUNTER — Encounter (HOSPITAL_COMMUNITY): Payer: Self-pay | Admitting: Orthopedic Surgery

## 2016-12-19 DIAGNOSIS — I10 Essential (primary) hypertension: Secondary | ICD-10-CM

## 2016-12-19 DIAGNOSIS — G894 Chronic pain syndrome: Secondary | ICD-10-CM

## 2016-12-19 DIAGNOSIS — E876 Hypokalemia: Secondary | ICD-10-CM

## 2016-12-19 LAB — BASIC METABOLIC PANEL
ANION GAP: 11 (ref 5–15)
ANION GAP: 11 (ref 5–15)
Anion gap: 10 (ref 5–15)
BUN: 8 mg/dL (ref 6–20)
BUN: 12 mg/dL (ref 6–20)
BUN: 13 mg/dL (ref 6–20)
CALCIUM: 8.1 mg/dL — AB (ref 8.9–10.3)
CALCIUM: 8.1 mg/dL — AB (ref 8.9–10.3)
CHLORIDE: 94 mmol/L — AB (ref 101–111)
CHLORIDE: 96 mmol/L — AB (ref 101–111)
CHLORIDE: 97 mmol/L — AB (ref 101–111)
CO2: 28 mmol/L (ref 22–32)
CO2: 24 mmol/L (ref 22–32)
CO2: 25 mmol/L (ref 22–32)
Calcium: 8.2 mg/dL — ABNORMAL LOW (ref 8.9–10.3)
Creatinine, Ser: 0.92 mg/dL (ref 0.44–1.00)
Creatinine, Ser: 1.08 mg/dL — ABNORMAL HIGH (ref 0.44–1.00)
Creatinine, Ser: 1.09 mg/dL — ABNORMAL HIGH (ref 0.44–1.00)
GFR calc Af Amer: 54 mL/min — ABNORMAL LOW (ref >60)
GFR calc non Af Amer: 57 mL/min — ABNORMAL LOW (ref >60)
GFR calc non Af Amer: 47 mL/min — ABNORMAL LOW (ref >60)
GFR calc non Af Amer: 46 mL/min — ABNORMAL LOW (ref >60)
GFR, EST AFRICAN AMERICAN: 54 mL/min — AB (ref >60)
Glucose, Bld: 118 mg/dL — ABNORMAL HIGH (ref 65–99)
Glucose, Bld: 134 mg/dL — ABNORMAL HIGH (ref 65–99)
Glucose, Bld: 154 mg/dL — ABNORMAL HIGH (ref 65–99)
POTASSIUM: 3.4 mmol/L — AB (ref 3.5–5.1)
Potassium: 2.9 mmol/L — ABNORMAL LOW (ref 3.5–5.1)
Potassium: 5.3 mmol/L — ABNORMAL HIGH (ref 3.5–5.1)
SODIUM: 133 mmol/L — AB (ref 135–145)
SODIUM: 131 mmol/L — AB (ref 135–145)
SODIUM: 132 mmol/L — AB (ref 135–145)

## 2016-12-19 LAB — CBC
HEMATOCRIT: 39.8 % (ref 36.0–46.0)
HEMOGLOBIN: 13.1 g/dL (ref 12.0–15.0)
MCH: 30.1 pg (ref 26.0–34.0)
MCHC: 32.9 g/dL (ref 30.0–36.0)
MCV: 91.5 fL (ref 78.0–100.0)
Platelets: 266 10*3/uL (ref 150–400)
RBC: 4.35 MIL/uL (ref 3.87–5.11)
RDW: 13.4 % (ref 11.5–15.5)
WBC: 13.5 10*3/uL — AB (ref 4.0–10.5)

## 2016-12-19 LAB — MAGNESIUM: MAGNESIUM: 1.1 mg/dL — AB (ref 1.7–2.4)

## 2016-12-19 MED ORDER — POTASSIUM CHLORIDE CRYS ER 20 MEQ PO TBCR
40.0000 meq | EXTENDED_RELEASE_TABLET | ORAL | Status: AC
  Administered 2016-12-19 (×2): 40 meq via ORAL
  Filled 2016-12-19 (×2): qty 2

## 2016-12-19 MED ORDER — MAGNESIUM SULFATE 2 GM/50ML IV SOLN
2.0000 g | Freq: Once | INTRAVENOUS | Status: AC
Start: 2016-12-19 — End: 2016-12-19
  Administered 2016-12-19: 2 g via INTRAVENOUS
  Filled 2016-12-19: qty 50

## 2016-12-19 NOTE — Evaluation (Signed)
Physical Therapy Evaluation Patient Details Name: [name] MRN: [id number] DOB: [birth year] Today's Date: 12/19/2016   History of Present Illness  Admitted with L wrist fx after fall landing on L wrist; PMH arthritis, HTN, fibromyalgia; uses a [place] at baseline  Clinical Impression  Patient is s/p above surgery resulting in functional limitations due to the deficits listed below (see PT Problem List). Pt is minA for bed mobility, minA for transfers to platform [place] and minguard for ambulation of 10 ft with platform [place].   Patient will benefit from skilled PT to increase their independence and safety with mobility to allow discharge to the venue listed below.       Follow Up Recommendations SNF;Supervision/Assistance - 24 hour    Equipment Recommendations   (to be determined at next venue)    Recommendations for Other Services OT consult     Precautions / Restrictions Precautions Precautions: Fall Required Braces or Orthoses: Sling (L UE sling) Restrictions Weight Bearing Restrictions: Yes LUE Weight Bearing: Non weight bearing      Mobility  Bed Mobility Overal bed mobility: Needs Assistance Bed Mobility: Supine to Sit     Supine to sit: HOB elevated;Min assist     General bed mobility comments: minA for pad scoot to EoB. vc for use of bedrail with R UE  Transfers Overall transfer level: Needs assistance Equipment used: Left platform [place] Transfers: Sit to/from Stand Sit to Stand: Min assist         General transfer comment: minA for power up into standing and getting L UE into platform [place]  Ambulation/Gait Ambulation/Gait assistance: Min guard Ambulation Distance (Feet): 15 Feet Assistive device: Left platform [place] Gait Pattern/deviations: Step-through pattern;WFL(Within Functional Limits);Shuffle Gait velocity: slowed   General Gait Details: slow, steady cadence, vc for sequencing and use of platform [place]        Balance Overall  balance assessment: Needs assistance;History of Falls Sitting-balance support: Feet supported;No upper extremity supported Sitting balance-Leahy Scale: Fair     Standing balance support: Single extremity supported;During functional activity Standing balance-Leahy Scale: Normal Standing balance comment: requires RW support for standing balance                             Pertinent Vitals/Pain Pain Location: L arm Pain Descriptors / Indicators: Constant;Discomfort;Grimacing;Guarding    Home Living Family/patient expects to be discharged to:: Skilled nursing facility Living Arrangements: Spouse/significant other Available Help at Discharge: Family;Available 24 hours/day Type of Home: House Home Access: Level entry     Home Layout: One level;Other (Comment) Home Equipment: [place] - 2 wheels;Tub bench;Grab bars - tub/shower;Other (comment)      Prior Function Level of Independence: Independent with assistive device(s);Needs assistance   Gait / Transfers Assistance Needed: functional mobility with RW  ADL's / Homemaking Assistance Needed: Pt performed ADLs. Family A with IADLs. Pt reports she was driving PTA  Comments: Stated HHPT before admission     Hand Dominance   Dominant Hand: Right    Extremity/Trunk Assessment   Upper Extremity Assessment Upper Extremity Assessment: Defer to OT evaluation    Lower Extremity Assessment Lower Extremity Assessment: Overall WFL for tasks assessed    Cervical / Trunk Assessment Cervical / Trunk Assessment: Kyphotic  Communication   Communication: No difficulties  Cognition Arousal/Alertness: Awake/alert Behavior During Therapy: WFL for tasks assessed/performed Overall Cognitive Status: Within Functional Limits for tasks assessed  General Comments General comments (skin integrity, edema, etc.): Daughter and husband in room. Pt SaO2 on 2L O2 via nasal cannula 94%  O2, SaO2 on RA 89% at rest rose to 94% with activity 2L O2 via nasal cannula replaced after therapy.    Exercises     Assessment/Plan    PT Assessment Patient needs continued PT services  PT Problem List Decreased strength;Decreased range of motion;Decreased activity tolerance;Decreased balance;Decreased mobility;Decreased knowledge of use of DME;Pain       PT Treatment Interventions DME instruction;Gait training;Functional mobility training;Therapeutic activities;Therapeutic exercise;Balance training;Patient/family education    PT Goals (Current goals can be found in the Care Plan section)  Acute Rehab PT Goals Patient Stated Goal: go home PT Goal Formulation: With patient Time For Goal Achievement: 12/26/16 Potential to Achieve Goals: Good    Frequency Min 3X/week    AM-PAC PT "6 Clicks" Daily Activity  Outcome Measure Difficulty turning over in bed (including adjusting bedclothes, sheets and blankets)?: Total Difficulty moving from lying on back to sitting on the side of the bed? : Total Difficulty sitting down on and standing up from a chair with arms (e.g., wheelchair, bedside commode, etc,.)?: Total Help needed moving to and from a bed to chair (including a wheelchair)?: A Lot Help needed walking in hospital room?: A Lot Help needed climbing 3-5 steps with a railing? : Total 6 Click Score: 8    End of Session Equipment Utilized During Treatment: Gait belt;Oxygen Activity Tolerance: Patient limited by pain Patient left: in bed;with call bell/phone within reach;with family/visitor present Nurse Communication: Mobility status;Weight bearing status PT Visit Diagnosis: Other abnormalities of gait and mobility (R26.89);Repeated falls (R29.6);History of falling (Z91.81);Pain Pain - Right/Left: Left Pain - part of body: Arm    Time:  - 2336-1224 40 minutes       Charges:  Low Eval                          23-37 min Gait Training       PT G Codes:         Florene Brill B. Migdalia Dk PT, DPT Acute Rehabilitation  [phone] Pager [phone]    [place] 12/19/2016, 4:29 PM

## 2016-12-19 NOTE — NC FL2 (Signed)
[place] LEVEL OF CARE SCREENING TOOL     IDENTIFICATION  Patient Name: [name] Birthdate: 1933/02/24 Sex: female Admission Date (Current Location): 12/17/2016  [facility] and [place] Number:  Herbalist and Address:  [facility] [place]. [place] [facility], [place] [address], [place], [place] [address]      Provider Number: [id number]  Attending Physician Name and Address:  Bonnielee Haff, MD  Relative Name and Phone Number:       Current Level of Care: Hospital Recommended Level of Care: [facility] Prior Approval Number:    Date Approved/Denied: 12/19/16 PASRR Number: pending  Discharge Plan: SNF    Current Diagnoses: Patient Active Problem List   Diagnosis Date Noted  . Fracture of left wrist 12/18/2016  . Left wrist fracture 12/18/2016  . Lumbago 12/08/2016  . Status post total shoulder arthroplasty, right 11/11/2016  . S/P total knee arthroplasty, right 11/11/2016  . Primary osteoarthritis of left hip 07/28/2016  . History of total right hip arthroplasty 06/17/2016  . Prediabetes 02/24/2016  . Chronic kidney disease (CKD), stage III (moderate) 02/24/2016  . Weakness 04/20/2015  . Elevated LFTs 03/10/2015  . Seasonal allergies 02/24/2015  . Primary osteoarthritis of left knee 01/01/2015  . Right ankle pain 10/09/2014  . Constipation 05/23/2014  . Left shoulder pain 04/30/2014  . Osteoporosis 03/12/2014  . Personal history of colonic polyps 03/12/2014  . Chronic rheumatic arthritis ([place]) 02/19/2014  . Anxiety and depression 02/19/2014  . Bradycardia 06/03/2013  . Edema extremities   . GERD (gastroesophageal reflux disease)   . HTN (hypertension) 03/31/2011  . Dyslipidemia 03/31/2011  . Depression 03/31/2011  . Fibromyalgia 03/31/2011  . DJD (degenerative joint disease) 03/31/2011  . IBD 09/27/2010  . COUGH, CHRONIC 09/24/2010    Orientation RESPIRATION BLADDER Height & Weight     Self, Time, Situation, Place  O2 (Nasal cannula 4L) Incontinent Weight:   Height:     BEHAVIORAL SYMPTOMS/MOOD NEUROLOGICAL BOWEL NUTRITION STATUS      Continent Diet (See DC summary)  AMBULATORY STATUS COMMUNICATION OF NEEDS Skin   Limited Assist Verbally Surgical wounds (Closed Left Hand Incision, with compression wrap)                       Personal Care Assistance Level of Assistance  Feeding, Dressing, Bathing Bathing Assistance: Limited assistance Feeding assistance: Limited assistance Dressing Assistance: Limited assistance     Functional Limitations Info  Sight, Hearing, Speech Sight Info: Adequate Hearing Info: Adequate Speech Info: Adequate    SPECIAL CARE FACTORS FREQUENCY  PT (By licensed PT), OT (By licensed OT)     PT Frequency: 5xweek OT Frequency: 5Xweek            Contractures      Additional Factors Info  Code Status, Allergies, Psychotropic Code Status Info: Full Allergies Info: BEE VENOM, CEFTIN CEFUROXIME AXETIL, CIPROFLOXACIN, EPHEDRINE, SULFONAMIDE DERIVATIVES, TELITHROMYCIN, VALSARTAN, VERAPAMIL, VENLAFAXINE, BETA ADRENERGIC BLOCKERS, CEPHALOSPORINS, CLINDAMYCIN/LINCOMYCIN, CYMBALTA DULOXETINE HCL, DOXAZOSIN, GABAPENTIN, HYDRALAZINE, LAMOTRIGINE, MIRTAZAPINE, MORPHINE, OXYCODONE-ACETAMINOPHEN, PREGABALIN, PROCHLORPERAZINE EDISYLATE, PSEUDOEPHEDRINE, RELAFEN NABUMETONE, ZOLOFT SERTRALINE, DOXYCYCLINE Psychotropic Info: Effexor          Current Medications (12/19/2016):  This is the current hospital active medication list Current Facility-Administered Medications  Medication Dose Route Frequency Provider Last Rate Last Dose  . ALPRAZolam Duanne Moron) tablet 0.5 mg  0.5 mg Oral QID PRN Etta Quill, DO   0.5 mg at 12/19/16 0847  . amLODipine (NORVASC) tablet 10 mg  10  mg Oral Daily Etta Quill, DO   10 mg at 12/19/16 1610  . calcium-vitamin D (OSCAL WITH D) 500-200 MG-UNIT per tablet 1 tablet  1 tablet Oral BID Etta Quill, DO   1 tablet at 12/19/16 [phone]  .  diclofenac sodium (VOLTAREN) 1 % transdermal gel 1 application  1 application Topical QID Etta Quill, DO   1 application at 54/09/81 [phone]  . diphenhydrAMINE (BENADRYL) capsule 25 mg  25 mg Oral Q6H PRN Etta Quill, DO      . fentaNYL (DURAGESIC - dosed mcg/hr) 12.5 mcg  12.5 mcg Transdermal Q72H Jennette Kettle M, DO   12.5 mcg at 12/19/16 7829  . hydrochlorothiazide (HYDRODIURIL) tablet 25 mg  25 mg Oral Daily Jennette Kettle M, DO   25 mg at 12/19/16 5621  . ipratropium (ATROVENT) 0.06 % nasal spray 2 spray  2 spray Each Nare TID PRN Etta Quill, DO      . magnesium sulfate IVPB 2 g 50 mL  2 g Intravenous Once Bonnielee Haff, MD 50 mL/hr at 12/19/16 1214 2 g at 12/19/16 1214  . metoCLOPramide (REGLAN) tablet 5-10 mg  5-10 mg Oral Q8H PRN Iran Planas, MD       Or  . metoCLOPramide (REGLAN) injection 5-10 mg  5-10 mg Intravenous Q8H PRN Iran Planas, MD      . montelukast (SINGULAIR) tablet 10 mg  10 mg Oral QHS Jennette Kettle M, DO   10 mg at 12/18/16 2104  . ondansetron (ZOFRAN) tablet 4 mg  4 mg Oral Q6H PRN Iran Planas, MD       Or  . ondansetron [facility]) injection 4 mg  4 mg Intravenous Q6H PRN Iran Planas, MD      . ondansetron [facility]) tablet 4-8 mg  4-8 mg Oral Q8H PRN Etta Quill, DO      . oxyCODONE (Oxy IR/ROXICODONE) immediate release tablet 10-15 mg  10-15 mg Oral Q6H PRN Etta Quill, DO   10 mg at 12/19/16 1212  . simvastatin (ZOCOR) tablet 10 mg  10 mg Oral q1800 Etta Quill, DO   10 mg at 12/18/16 1759  . venlafaxine XR (EFFEXOR-XR) 24 hr capsule 37.5 mg  37.5 mg Oral Q breakfast Etta Quill, DO         Discharge Medications: Please see discharge summary for a list of discharge medications.  Relevant Imaging Results:  Relevant Lab Results:   Additional Information [id number]  [name], [name]

## 2016-12-19 NOTE — Progress Notes (Signed)
PT Cancellation Note  Patient Details Name: [name] MRN: [id number] DOB: Aug 24, 1932   Cancelled Treatment:    Reason Eval/Treat Not Completed: Pain limiting ability to participate (Pt requests to come back later as she is too tired ) PT will return as able in afternoon.   [place] 12/19/2016, 12:31 PM

## 2016-12-19 NOTE — Progress Notes (Signed)
[facility] PROGRESS NOTE  JONAH NESTLE TTS:[id number] DOB: 1933-04-25 DOA: 12/17/2016  PCP: Gregor Hams, MD  Brief History/Interval Summary: 83 year old Caucasian female with a past medical history of chronic back pain, arthritis, fibromyalgia, hypertension, presented to the emergency department after sustaining a mechanical fall at home. This resulted in fracture of her left distal radius. She was hospitalized for further management. She underwent surgery.  Reason for Visit: Left distal radial fracture  Consultants: Orthopedics  Procedures:  OPERATIVE PROCEDURE: #1: Open treatment of left wrist intra-articular distal radius fracture 3 more fragments. #2: Left forearm brachia radialis tendon tenotomy and tendon release    Antibiotics: None  Subjective/Interval History: Patient continues to have pain in her left wrist area. Denies any chest pain or shortness of breath. Daughter is at the bedside.   ROS: Denies any nausea or vomiting  Objective:  Vital Signs  Vitals:   12/18/16 2059 12/19/16 0029 12/19/16 0452 12/19/16 0529  BP: 139/61 (!) 125/58 (!) 141/65   Pulse: 99 (!) 102 (!) 103   Resp:      Temp: 98.3 F (36.8 C) 98.3 F (36.8 C) 98.7 F (37.1 C)   TempSrc: Oral Oral Oral   SpO2: 94% 91% 91% 94%    Intake/Output Summary (Last 24 hours) at 12/19/16 0759 Last data filed at 12/18/16 1500  Gross per 24 hour  Intake              650 ml  Output              160 ml  Net              490 ml   There were no vitals filed for this visit.  General appearance: Awake and alert. No distress. Resp: Clear to auscultation bilaterally Cardio: S1, S2 is normal, regular. No S3, S4. No rubs, murmurs, or bruit GI: Abdomen is soft. Nontender, nondistended. Bowel sounds present. No masses, organomegaly Extremities: Left arm is in a sling. Dressing is noted. Good capillary refill. Neurologic: Awake, alert. No focal deficits.  Lab Results:  Data Reviewed: I  have personally reviewed following labs and imaging studies  CBC:  Recent Labs Lab 12/18/16 0010 12/19/16 0625  WBC 17.4* 13.5*  NEUTROABS 13.9*  --   HGB 14.2 13.1  HCT 42.7 39.8  MCV 90.5 91.5  PLT 314 092    Basic Metabolic Panel:  Recent Labs Lab 12/18/16 0010 12/19/16 0625  NA 133* 133*  K 3.6 2.9*  CL 96* 94*  CO2 24 28  GLUCOSE 123* 118*  BUN 15 8  CREATININE 1.32* 0.92  CALCIUM 8.8* 8.1*    Coagulation Profile:  Recent Labs Lab 12/18/16 0010  INR 1.11     Radiology Studies: Dg Forearm Left  Result Date: 12/18/2016 CLINICAL DATA:  Deformity of the left distal forearm after fall. EXAM: LEFT FOREARM - 2 VIEW COMPARISON:  None. FINDINGS: Acute, closed, dorsally angulated distal radius and ulnar metaphyseal fractures are identified with comminuted appearance of the distal radius fracture fragment. The radius fractures extend into the distal radioulnar and radiocarpal joints with slight dorsal displacement of the main fracture fragment. There is slight widening of the distal radioulnar joint as result of the fractures. Osteoarthritic joint space narrowing of the first CMC and triscaphe articulations of the wrist. Soft tissue swelling of the distal forearm and wrist. The elbow joint is maintained. IMPRESSION: 1. Acute, comminuted intra-articular dorsally angulated fracture of the distal radial metaphysis and epiphysis extending into  the radiocarpal and distal radioulnar joints. 2. Acute, closed dorsal and radial angulated fracture of the ulnar metaphysis. 3. Slight widened appearance of the distal radioulnar joint with slight foreshortened appearance the radius due to the fractures. 4. Osteoarthritis of the first [facility] and triscaphe joints. 5. Intact elbow joint with soft tissue swelling of the distal forearm and wrist. Electronically Signed   By: Ashley Royalty M.D.   On: 12/18/2016 00:21   Dg Wrist Complete Left  Result Date: 12/18/2016 CLINICAL DATA:  Wrist deformity  after fall. EXAM: LEFT WRIST - COMPLETE 3+ VIEW COMPARISON:  None. FINDINGS: Acute, closed, dorsally angulated distal radius and ulnar metaphyseal fractures are identified with comminuted appearance of the distal radius fracture fragment. The radius fractures extend into the distal radioulnar and radiocarpal joints with slight dorsal displacement of the main fracture fragment. There is slight widening of the distal radioulnar joint as result of the fractures. Osteoarthritic joint space narrowing of the first CMC and triscaphe articulations of the wrist. Moderate soft tissue swelling is noted about the wrist and distal forearm. IMPRESSION: 1. Acute, comminuted intra-articular dorsally angulated fracture of the distal radial metaphysis and epiphysis extending into the radiocarpal and distal radioulnar joints. 2. Acute, closed dorsal and radial angulated fracture of the ulnar metaphysis. 3. Slight widened appearance of the distal radioulnar joint with slight foreshortened appearance the radius due to the fractures. 4. Osteoarthritis of the first [facility] and triscaphe joints. Electronically Signed   By: Ashley Royalty M.D.   On: 12/18/2016 00:22     Medications:  Scheduled: . amLODipine  10 mg Oral Daily  . calcium-vitamin D  1 tablet Oral BID  . diclofenac sodium  1 application Topical QID  . fentaNYL  12.5 mcg Transdermal Q72H  . hydrochlorothiazide  25 mg Oral Daily  . montelukast  10 mg Oral QHS  . potassium chloride SA  20 mEq Oral Daily  . simvastatin  10 mg Oral q1800  . venlafaxine XR  37.5 mg Oral Q breakfast   Continuous:  BWG:[id number], diphenhydrAMINE, ipratropium, metoCLOPramide **OR** metoCLOPramide (REGLAN) injection, ondansetron **OR** ondansetron (ZOFRAN) IV, ondansetron, oxyCODONE  Assessment/Plan:  Active Problems:   Fracture of left wrist   Left wrist fracture    Fracture of the left distal radius Result of a mechanical fall. Patient seen by orthopedics. She underwent surgery  yesterday. Continue pain medications. Continue PT and OT. Follow up with orthopedics in 2 weeks. Patient may need to go to skilled nursing facility for short-term rehabilitation. This was discussed with patient and daughter. Await PT and OT evaluations.   Chronic pain syndrome. Patient has pain due to arthritis, she has chronic back pain. Followed by pain specialist. She is on a fentanyl patch along with OxyIR. These will be continued for now.  Hypokalemia and hypomagnesemia These will be repleted.  Essential hypertension. Blood pressure is reasonably well controlled. Continue medications.  History of depression and anxiety. Stable. Continue venlafaxine and alprazolam.  Leukocytosis Possibly reactive. WBC has improved. She is afebrile. No evidence for infection.    DVT Prophylaxis: SCDs    Code Status: Full code  Family Communication: Discussed with patient and her daughter. Disposition Plan: Management as outlined above. Replace electrolytes. PT and OT eval pending. Consult Education officer, museum.    LOS: 1 day   [facility] Pager [phone] 12/19/2016, 7:59 AM  If 7PM-7AM, please contact night-coverage at [email or web address], password [facility]

## 2016-12-19 NOTE — Evaluation (Signed)
Occupational Therapy Evaluation Patient Details Name: [name] MRN: [id number] DOB: 03-09-1933 Today's Date: 12/19/2016    History of Present Illness Admitted with L wrist fx after fall landing on L wrist; PMH arthritis, HTN, fibromyalgia; uses a [place] at baseline   Clinical Impression   PTA, pt was living with her husband and was independent in ADLs with family assisting with IADLs. Currently, pt requires Mod-Max A for ADLs and functional mobility. Educated pt and daughter on edema management, positioning, and gentle exercises; pt and daughter verbalized understanding. Pt would benefit from skilled acute OT to facilitate safe dc and increase occupational performance and participation. Recommend dc to SNF for ST rehab to increase safety and independence prior to transitioning home.     Follow Up Recommendations  SNF;Supervision/Assistance - 24 hour    Equipment Recommendations  Other (comment) (Defer to next venue)    Recommendations for Other Services       Precautions / Restrictions Precautions Precautions: Fall Restrictions Weight Bearing Restrictions: Yes LUE Weight Bearing: Non weight bearing (Can WB through L elbow)      Mobility Bed Mobility Overal bed mobility: Needs Assistance Bed Mobility: Supine to Sit     Supine to sit: Mod assist;HOB elevated     General bed mobility comments: Pt able to transition BLE towards EOB. Required A to shift hips using pad and lift trunk into upright.   Transfers Overall transfer level: Needs assistance Equipment used: 1 person hand held assist Transfers: Sit to/from [facility] Sit to Stand: Mod assist Stand pivot transfers: Mod assist       General transfer comment: Pt requires Mod A to power up into standing.    Balance Overall balance assessment: Needs assistance;History of Falls Sitting-balance support: Feet supported;No upper extremity supported Sitting balance-Leahy Scale: Fair      Standing balance support: Single extremity supported;During functional activity Standing balance-Leahy Scale: Poor Standing balance comment: Required physical A for standing balance                           ADL either performed or assessed with clinical judgement   ADL Overall ADL's : Needs assistance/impaired Eating/Feeding: Set up;Bed level   Grooming: Min guard;Sitting   Upper Body Bathing: Moderate assistance;Sitting   Lower Body Bathing: Moderate assistance;Sit to/from stand   Upper Body Dressing : Maximal assistance;Sitting   Lower Body Dressing: Maximal assistance;Sit to/from stand   Toilet Transfer: Moderate assistance;BSC;Stand-pivot;Cueing for sequencing;Cueing for safety Toilet Transfer Details (indicate cue type and reason): Provided single hand held A. Pt required Mod A for standing balance and had increased fear of falling while in standing which increase her risk of falling Toileting- Clothing Manipulation and Hygiene: Moderate assistance;Sit to/from stand Toileting - Clothing Manipulation Details (indicate cue type and reason): Provided Mod A for balance in standing while pt performed toilet hygiene. Pt only performing peri care and would not have been able to complete toilet hygiene for BM     Functional mobility during ADLs: Moderate assistance (Sinlge hand held A) General ADL Comments: Pt limited by pain and fear of falling. Pt also reports feeling nauseous. Denies dizziness or SOB     Vision         Perception     Praxis      Pertinent Vitals/Pain Pain Assessment: Faces Faces Pain Scale: Hurts even more Pain Location: L arm Pain Descriptors / Indicators: Constant;Discomfort;Grimacing;Guarding Pain Intervention(s): Monitored during session;Limited activity within patient's tolerance;Repositioned  Hand Dominance Right   Extremity/Trunk Assessment Upper Extremity Assessment Upper Extremity Assessment: LUE deficits/detail LUE  Deficits / Details: L arm casted. Limited ROM of shoulder due to pain. wiggling fingers.  LUE: Unable to fully assess due to pain;Unable to fully assess due to immobilization LUE Coordination: decreased fine motor;decreased gross motor   Lower Extremity Assessment Lower Extremity Assessment: Defer to PT evaluation   Cervical / Trunk Assessment Cervical / Trunk Assessment: Kyphotic   Communication Communication Communication: No difficulties   Cognition Arousal/Alertness: Awake/alert Behavior During Therapy: WFL for tasks assessed/performed Overall Cognitive Status: Within Functional Limits for tasks assessed                                     General Comments  Pt daughter [name] present for session    Exercises Exercises: General Upper Extremity General Exercises - Upper Extremity Shoulder Flexion: Left;5 reps;Seated;PROM Digit Composite Flexion: Left;5 reps;Supine;AROM Composite Extension: Left;5 reps;Supine;AROM   Shoulder Instructions      Home Living Family/patient expects to be discharged to:: Private residence Living Arrangements: Spouse/significant other Available Help at Discharge: Family;Available 24 hours/day (Husband) Type of Home: House Home Access: Level entry     Home Layout: One level;Other (Comment) (One step from living room to kitchen)     Bathroom Shower/Tub: Teacher, early years/pre: Standard     Home Equipment: Environmental consultant - 2 wheels;Tub bench;Grab bars - tub/shower;Other (comment) (HHPT recommended toilet riser and bed rail)          Prior Functioning/Environment Level of Independence: Independent with assistive device(s);Needs assistance  Gait / Transfers Assistance Needed: functional mobility with RW ADL's / Homemaking Assistance Needed: Pt performed ADLs. Family A with IADLs. Pt reports she was driving PTA   Comments: Stated HHPT before admittion        OT Problem List: Decreased strength;Decreased range of  motion;Decreased activity tolerance;Impaired balance (sitting and/or standing);Decreased safety awareness;Decreased knowledge of use of DME or AE;Decreased knowledge of precautions;Impaired UE functional use;Pain      OT Treatment/Interventions: Self-care/ADL training;Therapeutic exercise;Energy conservation;DME and/or AE instruction;Therapeutic activities;Patient/family education    OT Goals(Current goals can be found in the care plan section) Acute Rehab OT Goals Patient Stated Goal: Go home OT Goal Formulation: With patient Time For Goal Achievement: 01/02/17 Potential to Achieve Goals: Good ADL Goals Pt Will Perform Grooming: standing;with min assist Pt Will Perform Upper Body Bathing: with min assist;with caregiver independent in assisting;sitting Pt Will Perform Upper Body Dressing: with min assist;with caregiver independent in assisting;sitting Pt Will Perform Lower Body Dressing: with min assist;with caregiver independent in assisting;sit to/from stand Pt Will Transfer to Toilet: with min assist;ambulating;bedside commode Pt Will Perform Toileting - Clothing Manipulation and hygiene: with min assist;sit to/from stand  OT Frequency: Min 2X/week   Barriers to D/C:            Co-evaluation              AM-PAC PT "6 Clicks" Daily Activity     Outcome Measure Help from another person eating meals?: A Little Help from another person taking care of personal grooming?: A Little Help from another person toileting, which includes using toliet, bedpan, or urinal?: A Lot Help from another person bathing (including washing, rinsing, drying)?: A Lot Help from another person to put on and taking off regular upper body clothing?: A Lot Help from another person to put on and taking  off regular lower body clothing?: A Lot 6 Click Score: 14   End of Session Equipment Utilized During Treatment: Gait belt Nurse Communication: Mobility status;Weight bearing status;Precautions  Activity  Tolerance: Patient limited by pain Patient left: in chair;with call bell/phone within reach;with nursing/sitter in room;with family/visitor present  OT Visit Diagnosis: Unsteadiness on feet (R26.81);Other abnormalities of gait and mobility (R26.89);Muscle weakness (generalized) (M62.81);Pain Pain - Right/Left: Left Pain - part of body: Arm                Time: 9532-0233 OT Time Calculation (min): 41 min Charges:  OT General Charges $OT Visit: 1 Procedure OT Treatments $Self Care/Home Management : 23-37 mins G-Codes:     [employer], OTR/L [place] 12/19/2016, 9:02 AM

## 2016-12-20 ENCOUNTER — Encounter (HOSPITAL_COMMUNITY): Payer: Self-pay | Admitting: Orthopedic Surgery

## 2016-12-20 ENCOUNTER — Other Ambulatory Visit: Payer: Self-pay

## 2016-12-20 DIAGNOSIS — G8929 Other chronic pain: Secondary | ICD-10-CM

## 2016-12-20 LAB — BASIC METABOLIC PANEL
Anion gap: 9 (ref 5–15)
BUN: 10 mg/dL (ref 6–20)
CHLORIDE: 97 mmol/L — AB (ref 101–111)
CO2: 29 mmol/L (ref 22–32)
Calcium: 8.6 mg/dL — ABNORMAL LOW (ref 8.9–10.3)
Creatinine, Ser: 0.95 mg/dL (ref 0.44–1.00)
GFR calc Af Amer: >60 mL/min (ref >60)
GFR calc non Af Amer: 55 mL/min — ABNORMAL LOW (ref >60)
Glucose, Bld: 97 mg/dL (ref 65–99)
POTASSIUM: 3.7 mmol/L (ref 3.5–5.1)
SODIUM: 135 mmol/L (ref 135–145)

## 2016-12-20 LAB — CBC
HEMATOCRIT: 42.6 % (ref 36.0–46.0)
Hemoglobin: 13.5 g/dL (ref 12.0–15.0)
MCH: 29.7 pg (ref 26.0–34.0)
MCHC: 31.7 g/dL (ref 30.0–36.0)
MCV: 93.6 fL (ref 78.0–100.0)
Platelets: 256 10*3/uL (ref 150–400)
RBC: 4.55 MIL/uL (ref 3.87–5.11)
RDW: 13.8 % (ref 11.5–15.5)
WBC: 11.8 10*3/uL — ABNORMAL HIGH (ref 4.0–10.5)

## 2016-12-20 LAB — MAGNESIUM: MAGNESIUM: 2 mg/dL (ref 1.7–2.4)

## 2016-12-20 MED ORDER — FENTANYL 12 MCG/HR TD PT72: 12.5000 ug | MEDICATED_PATCH | TRANSDERMAL | 0 refills | Status: DC

## 2016-12-20 MED ORDER — ALPRAZOLAM 0.5 MG PO TABS: 0.5000 mg | ORAL_TABLET | Freq: Two times a day (BID) | ORAL | 0 refills | Status: DC | PRN

## 2016-12-20 MED ORDER — OXYCODONE HCL 10 MG PO TABS: 10.0000 mg | ORAL_TABLET | Freq: Four times a day (QID) | ORAL | 0 refills | Status: DC | PRN

## 2016-12-20 MED ORDER — POLYETHYLENE GLYCOL 3350 17 G PO PACK
17.0000 g | PACK | Freq: Every day | ORAL | Status: DC
  Administered 2016-12-20: 17 g via ORAL
  Filled 2016-12-20 (×2): qty 1

## 2016-12-20 MED ORDER — POTASSIUM CHLORIDE CRYS ER 20 MEQ PO TBCR
20.0000 meq | EXTENDED_RELEASE_TABLET | Freq: Every day | ORAL | Status: DC
  Administered 2016-12-20 – 2016-12-21 (×2): 20 meq via ORAL
  Filled 2016-12-20 (×2): qty 1

## 2016-12-20 MED ORDER — POTASSIUM CHLORIDE CRYS ER 20 MEQ PO TBCR: 20.0000 meq | EXTENDED_RELEASE_TABLET | Freq: Every day | ORAL | Status: DC

## 2016-12-20 MED ORDER — POLYETHYLENE GLYCOL 3350 17 G PO PACK: 17.0000 g | PACK | Freq: Every day | ORAL | 0 refills | Status: DC

## 2016-12-20 NOTE — Progress Notes (Addendum)
[facility] PROGRESS NOTE  [name] OVF:[id number] DOB: 1933-06-06 DOA: 12/17/2016  PCP: Gregor Hams, MD  Brief History/Interval Summary: 83 year old Caucasian female with a past medical history of chronic back pain, arthritis, fibromyalgia, hypertension, presented to the emergency department after sustaining a mechanical fall at home. This resulted in fracture of her left distal radius. She was hospitalized for further management. She underwent surgery.  Reason for Visit: Left distal radial fracture  Consultants: Orthopedics  Procedures:  OPERATIVE PROCEDURE: #1: Open treatment of left wrist intra-articular distal radius fracture 3 more fragments. #2: Left forearm brachia radialis tendon tenotomy and tendon release    Antibiotics: None  Subjective/Interval History: Patient feels well. Pain is reasonably well controlled. Daughter is at the bedside. Patient denies any nausea or vomiting.   ROS: No chest pain or shortness of breath  Objective:  Vital Signs  Vitals:   12/19/16 0529 12/19/16 1300 12/19/16 2230 12/20/16 0500  BP:  (!) 141/68 138/74 (!) 144/71  Pulse:  99 99 96  Resp:   16   Temp:  97.5 F (36.4 C) 98.4 F (36.9 C) 98.3 F (36.8 C)  TempSrc:  Oral Oral Axillary  SpO2: 94% 99% 91% 92%    Intake/Output Summary (Last 24 hours) at 12/20/16 1201 Last data filed at 12/20/16 0900  Gross per 24 hour  Intake              640 ml  Output                2 ml  Net              638 ml   There were no vitals filed for this visit.  General appearance: Awake, alert. No distress Resp: Clear to auscultation bilaterally Cardio: S1S2 is normal, regular. No S3, S4. No rubs, murmurs, or bruit GI: Abdomen is soft. Nontender, nondistended. Bowel sounds are present. No masses or organomegaly Extremities: Left arm is in a sling. Dressing is noted. Good capillary refill. Neurologic: Awake, alert. No focal deficits.  Lab Results:  Data Reviewed: I have  personally reviewed following labs and imaging studies  CBC:  Recent Labs Lab 12/18/16 0010 12/19/16 0625 12/20/16 0718  WBC 17.4* 13.5* 11.8*  NEUTROABS 13.9*  --   --   HGB 14.2 13.1 13.5  HCT 42.7 39.8 42.6  MCV 90.5 91.5 93.6  PLT 314 266 841    Basic Metabolic Panel:  Recent Labs Lab 12/18/16 0010 12/19/16 0625 12/19/16 1421 12/19/16 2015 12/20/16 0718  NA 133* 133* 131* 132* 135  K 3.6 2.9* 5.3* 3.4* 3.7  CL 96* 94* 96* 97* 97*  CO2 24 28 24 25 29   GLUCOSE 123* 118* 134* 154* 97  BUN 15 8 12 13 10   CREATININE 1.32* 0.92 1.08* 1.09* 0.95  CALCIUM 8.8* 8.1* 8.1* 8.2* 8.6*  MG  --  1.1*  --   --  2.0    Coagulation Profile:  Recent Labs Lab 12/18/16 0010  INR 1.11     Radiology Studies: No results found.   Medications:  Scheduled: . amLODipine  10 mg Oral Daily  . calcium-vitamin D  1 tablet Oral BID  . diclofenac sodium  1 application Topical QID  . fentaNYL  12.5 mcg Transdermal Q72H  . hydrochlorothiazide  25 mg Oral Daily  . montelukast  10 mg Oral QHS  . polyethylene glycol  17 g Oral Daily  . potassium chloride  20 mEq Oral Daily  .  simvastatin  10 mg Oral q1800  . venlafaxine XR  37.5 mg Oral Q breakfast   Continuous:  JDY:[id number], diphenhydrAMINE, ipratropium, metoCLOPramide **OR** metoCLOPramide (REGLAN) injection, ondansetron **OR** ondansetron (ZOFRAN) IV, ondansetron, oxyCODONE  Assessment/Plan:  Active Problems:   Fracture of left wrist   Left wrist fracture    Fracture of the left distal radius Result of a mechanical fall. Patient seen by orthopedics. She underwent surgery. She was stable. Pain is reasonably well controlled. Follow up with orthopedics(Dr. Caralyn Guile) in 2 weeks. Seen by physical therapy. Plan is for short-term rehabilitation at skilled nursing facility.   Chronic pain syndrome. Patient has pain due to arthritis, she has chronic back pain. Followed by pain specialist. She is on a fentanyl patch along with  OxyIR. These continued for now  Hypokalemia and hypomagnesemia These were repleted. She will need daily potassium, as she is on a diuretic.  Essential hypertension. Blood pressure is reasonably well controlled. Continue medications.  History of depression and anxiety. Stable. Continue venlafaxine and alprazolam.  Leukocytosis Possibly reactive. WBC has improved. She is afebrile. No evidence for infection.    Hyponatremia Resolved  DVT Prophylaxis: SCDs    Code Status: Full code  Family Communication: Discussed with patient and her daughter. Disposition Plan: Management as outlined above. Plan is for short-term rehabilitation at skilled nursing facility. Discharge tomorrow.    LOS: 2 days   [facility] Pager [phone] 12/20/2016, 12:01 PM  If 7PM-7AM, please contact night-coverage at [email or web address], password [facility]

## 2016-12-20 NOTE — Clinical Social Work Note (Signed)
Clinical Social Work Assessment  Patient Details  Name: [name] MRN: [id number] Date of Birth: [birth year]  Date of referral:  12/19/16               Reason for consult:  Facility Placement                Permission sought to share information with:    Permission granted to share information::  Yes, Verbal Permission Granted  Name::        Agency::  SNF  Relationship::     Contact Information:     Housing/Transportation Living arrangements for the past 2 months:  Single Family Home Source of Information:  Patient, Adult Children, Spouse Patient Interpreter Needed:  None Criminal Activity/Legal Involvement Pertinent to Current Situation/Hospitalization:  No - Comment as needed Significant Relationships:  Adult Children, Spouse Lives with:  Spouse Do you feel safe going back to the place where you live?  No Need for family participation in patient care:  Yes (Comment)  Care giving concerns:  Patient resides at home with spouse with physical limitations.  He is not able to assist patient and she is therefore not safe to return home at this time.  Social Worker assessment / plan:  CSW met with patient and family at bedside to discuss DC recommendations to SNF. CSW discussed her role and SNF options/placement.  CSW obtained verbal permission to send out offers to [place], [place] and [place] area as family wants patient close to home.    FL2 and PASSR complete. Offers sent.  Employment status:  Retired Forensic scientist:  Medicare PT Recommendations:  [place] / Referral to community resources:  [place]  Patient/Family's Response to care:  Patient and family appreciative of assistance with placement. No issues or concerns identified at this time.  Patient/Family's Understanding of and Emotional Response to Diagnosis, Current Treatment, and Prognosis:  Patient/family has good understanding of diagnosis, current  treatment, and prognosis.  Patient and family hopeful that rehabilitation will assist with physical impairment.  No issues and concerns at this time.  Emotional Assessment Appearance:  Appears stated age Attitude/Demeanor/Rapport:   (Cooperative) Affect (typically observed):  Accepting, Appropriate Orientation:  Oriented to Self, Oriented to Place, Oriented to  Time, Oriented to Situation Alcohol / Substance use:  Not Applicable Psych involvement (Current and /or in the community):  No (Comment)  Discharge Needs  Concerns to be addressed:  Care Coordination Readmission within the last 30 days:  No Current discharge risk:  Dependent with Mobility, Physical Impairment Barriers to Discharge:  No Barriers Identified   Carrie Baxter, [name] 12/20/2016, 10:21 AM

## 2016-12-20 NOTE — Discharge Summary (Signed)
[facility]  Physician Discharge Summary   Patient ID: [name] MRN: [id number] DOB/AGE: 01-10-1933 83 y.o.  Admit date: 12/17/2016 Discharge date: 12/20/2016  PCP: Gregor Hams, MD  DISCHARGE DIAGNOSES:  Active Problems:   Fracture of left wrist   Left wrist fracture   RECOMMENDATIONS FOR OUTPATIENT FOLLOW UP: 1. Please check CBC and basic metabolic panel beforehand of week 2. Monitor blood pressures periodically 3. Needs follow-up with Dr. Caralyn Guile in 2 weeks  DISCHARGE CONDITION: fair  Diet recommendation: Heart healthy  INITIAL HISTORY: 83 year old Caucasian female with a past medical history of chronic back pain, arthritis, fibromyalgia, hypertension, presented to the emergency department after sustaining a mechanical fall at home. This resulted in fracture of her left distal radius. She was hospitalized for further management. She underwent surgery.  Consultants: Orthopedics  Procedures:  OPERATIVE PROCEDURE: #1: Open treatment of left wrist intra-articular distal radius fracture 3 more fragments. #2: Left forearm brachia radialis tendon tenotomy and tendon release    HOSPITAL COURSE:   Fracture of the left distal radius Result of a mechanical fall. Patient seen by orthopedics. She underwent surgery. She was stable. Pain is reasonably well controlled. Follow up with orthopedics (Dr. Caralyn Guile) in 2 weeks. Seen by physical therapy. Plan is for short-term rehabilitation at skilled nursing facility.   Chronic pain syndrome. Patient has chronic pain due to arthritis, she also has chronic back pain. Followed by pain specialist. She is on a fentanyl patch along with OxyIR. These will be continued for now  Hypokalemia and hypomagnesemia These were repleted. She will need daily potassium, as she is on a diuretic. Check labs of the skilled nursing facility before end of this week.  Essential hypertension. Blood pressure is reasonably well controlled.  Continue medications.  History of depression and anxiety. Stable. Continue venlafaxine and alprazolam.  Leukocytosis Possibly reactive. WBC has improved. She is afebrile. No evidence for infection.    Hyponatremia Resolved  Overall, stable. Okay for discharge to skilled nursing facility when bed is available and insurance approval is available.   PERTINENT LABS:  The results of significant diagnostics from this hospitalization (including imaging, microbiology, ancillary and laboratory) are listed below for reference.     Labs: Basic Metabolic Panel:  Recent Labs Lab 12/18/16 0010 12/19/16 0625 12/19/16 1421 12/19/16 2015 12/20/16 0718  NA 133* 133* 131* 132* 135  K 3.6 2.9* 5.3* 3.4* 3.7  CL 96* 94* 96* 97* 97*  CO2 24 28 24 25 29   GLUCOSE 123* 118* 134* 154* 97  BUN 15 8 12 13 10   CREATININE 1.32* 0.92 1.08* 1.09* 0.95  CALCIUM 8.8* 8.1* 8.1* 8.2* 8.6*  MG  --  1.1*  --   --  2.0   CBC:  Recent Labs Lab 12/18/16 0010 12/19/16 0625 12/20/16 0718  WBC 17.4* 13.5* 11.8*  NEUTROABS 13.9*  --   --   HGB 14.2 13.1 13.5  HCT 42.7 39.8 42.6  MCV 90.5 91.5 93.6  PLT 314 266 256    IMAGING STUDIES Dg Forearm Left  Result Date: 12/18/2016 CLINICAL DATA:  Deformity of the left distal forearm after fall. EXAM: LEFT FOREARM - 2 VIEW COMPARISON:  None. FINDINGS: Acute, closed, dorsally angulated distal radius and ulnar metaphyseal fractures are identified with comminuted appearance of the distal radius fracture fragment. The radius fractures extend into the distal radioulnar and radiocarpal joints with slight dorsal displacement of the main fracture fragment. There is slight widening of the distal radioulnar joint as result of  the fractures. Osteoarthritic joint space narrowing of the first CMC and triscaphe articulations of the wrist. Soft tissue swelling of the distal forearm and wrist. The elbow joint is maintained. IMPRESSION: 1. Acute, comminuted intra-articular  dorsally angulated fracture of the distal radial metaphysis and epiphysis extending into the radiocarpal and distal radioulnar joints. 2. Acute, closed dorsal and radial angulated fracture of the ulnar metaphysis. 3. Slight widened appearance of the distal radioulnar joint with slight foreshortened appearance the radius due to the fractures. 4. Osteoarthritis of the first [facility] and triscaphe joints. 5. Intact elbow joint with soft tissue swelling of the distal forearm and wrist. Electronically Signed   By: Ashley Royalty M.D.   On: 12/18/2016 00:21   Dg Wrist Complete Left  Result Date: 12/18/2016 CLINICAL DATA:  Wrist deformity after fall. EXAM: LEFT WRIST - COMPLETE 3+ VIEW COMPARISON:  None. FINDINGS: Acute, closed, dorsally angulated distal radius and ulnar metaphyseal fractures are identified with comminuted appearance of the distal radius fracture fragment. The radius fractures extend into the distal radioulnar and radiocarpal joints with slight dorsal displacement of the main fracture fragment. There is slight widening of the distal radioulnar joint as result of the fractures. Osteoarthritic joint space narrowing of the first CMC and triscaphe articulations of the wrist. Moderate soft tissue swelling is noted about the wrist and distal forearm. IMPRESSION: 1. Acute, comminuted intra-articular dorsally angulated fracture of the distal radial metaphysis and epiphysis extending into the radiocarpal and distal radioulnar joints. 2. Acute, closed dorsal and radial angulated fracture of the ulnar metaphysis. 3. Slight widened appearance of the distal radioulnar joint with slight foreshortened appearance the radius due to the fractures. 4. Osteoarthritis of the first [facility] and triscaphe joints. Electronically Signed   By: Ashley Royalty M.D.   On: 12/18/2016 00:22    DISCHARGE EXAMINATION: See progress note from earlier today  DISPOSITION: SNF  Discharge Instructions    Call MD for:  extreme fatigue    Complete  by:  As directed    Call MD for:  persistant dizziness or light-headedness    Complete by:  As directed    Call MD for:  persistant nausea and vomiting    Complete by:  As directed    Call MD for:  severe uncontrolled pain    Complete by:  As directed    Call MD for:  temperature >100.4    Complete by:  As directed    Discharge instructions    Complete by:  As directed    Please see instructions/recommendations on the discharge summary.  You were cared for by a hospitalist during your hospital stay. If you have any questions about your discharge medications or the care you received while you were in the hospital after you are discharged, you can call the unit and asked to speak with the hospitalist on call if the hospitalist that took care of you is not available. Once you are discharged, your primary care physician will handle any further medical issues. Please note that NO REFILLS for any discharge medications will be authorized once you are discharged, as it is imperative that you return to your primary care physician (or establish a relationship with a primary care physician if you do not have one) for your aftercare needs so that they can reassess your need for medications and monitor your lab values. If you do not have a primary care physician, you can call [phone] for a physician referral.   Increase activity slowly  Complete by:  As directed       ALLERGIES:  Allergies  Allergen Reactions  . Bee Venom Anaphylaxis    Has epi-pen  . Ceftin [Cefuroxime Axetil] Anaphylaxis  . Ciprofloxacin Anaphylaxis and Palpitations  . Ephedrine Other (See Comments) and Palpitations    "knocks me out"  . Sulfonamide Derivatives Anaphylaxis  . Telithromycin Anaphylaxis    Reaction: also blurred vision   . Valsartan Anaphylaxis  . Verapamil Anaphylaxis  . Venlafaxine Other (See Comments)    Insomnia, panic. Pt takes dexvenlafaxine at home  . Beta Adrenergic Blockers     bradycardia  .  Cephalosporins     Allergic Reaction  . Clindamycin/Lincomycin     Dry skin and itching/ Cdiff  . Cymbalta [Duloxetine Hcl] Other (See Comments)    Reaction:Confusion and " I fall down"  . Doxazosin     "blurred vision and irritable"  . Gabapentin Other (See Comments)    Reaction: headache  . Hydralazine     HA, Diarrhea  . Lamotrigine     Patient unsure at this time  . Mirtazapine Other (See Comments)    unknown  . Morphine Other (See Comments)    Medication has no effect with pain  . Oxycodone-Acetaminophen Hives    Takes plain oxy IR at home (May 2018)  . Pregabalin Swelling  . Prochlorperazine Edisylate   . Pseudoephedrine Other (See Comments)    Reaction: hyperventilates and blacks out  . Relafen [Nabumetone] Swelling  . Zoloft [Sertraline] Other (See Comments)    "nervous/jittery"  . Doxycycline Hives     Current Discharge Medication List    START taking these medications   Details  polyethylene glycol (MIRALAX / GLYCOLAX) packet Take 17 g by mouth daily. Qty: 14 each, Refills: 0      CONTINUE these medications which have CHANGED   Details  ALPRAZolam (XANAX) 0.5 MG tablet Take 1 tablet (0.5 mg total) by mouth 2 (two) times daily as needed for anxiety. Qty: 15 tablet, Refills: 0    fentaNYL (DURAGESIC - DOSED MCG/HR) 12 MCG/HR Place 1 patch (12.5 mcg total) onto the skin every 3 (three) days. Qty: 5 patch, Refills: 0    Oxycodone HCl 10 MG TABS Take 1 tablet (10 mg total) by mouth 4 (four) times daily as needed (for pain). Qty: 20 tablet, Refills: 0      CONTINUE these medications which have NOT CHANGED   Details  amLODipine (NORVASC) 10 MG tablet take 1 tablet by mouth once daily Qty: 90 tablet, Refills: 2    Calcium Carbonate-Vitamin D (CALCIUM 600+D) 600-400 MG-UNIT per tablet Take 1 tablet by mouth 2 (two) times daily.    Desvenlafaxine Succinate ER 25 MG TB24 Take 25 mg by mouth daily. Refills: 0    diclofenac sodium (VOLTAREN) 1 % GEL Apply 1  application topically 4 (four) times daily. Refills: 0    diphenhydrAMINE (BENADRYL) 25 MG tablet Take 25 mg by mouth every 6 (six) hours as needed. For allergies    EPINEPHrine (ADRENACLICK) 0.3 HW/8.0 mL IJ SOAJ injection Inject 0.3 mLs (0.3 mg total) into the muscle once. Qty: 1 Device, Refills: 1    hydrochlorothiazide (HYDRODIURIL) 25 MG tablet take 1 tablet by mouth every morning for blood pressure Qty: 90 tablet, Refills: 1    ipratropium (ATROVENT) 0.06 % nasal spray Place 2 sprays into both nostrils 3 (three) times daily as needed for rhinitis. Qty: 15 mL, Refills: 12   Associated Diagnoses: Nasal congestion  montelukast (SINGULAIR) 10 MG tablet take 1 tablet by mouth at bedtime Qty: 30 tablet, Refills: 3    omeprazole (PRILOSEC) 20 MG capsule Take 20 mg by mouth daily. Refills: 0    potassium chloride SA (K-DUR,KLOR-CON) 20 MEQ tablet take 1 tablet by mouth once daily Qty: 30 tablet, Refills: 1    simvastatin (ZOCOR) 10 MG tablet Take 1 tablet (10 mg total) by mouth daily. Qty: 90 tablet, Refills: 1    zoledronic acid (RECLAST) 5 MG/100ML SOLN Inject 5 mg into the vein. Once yearly. In June    AMBULATORY NON FORMULARY MEDICATION [place].  Dx: Rheumatoid Arthritis.  Use daily as needed for ambulation. Qty: 1 Units, Refills: 0   Associated Diagnoses: Rheumatoid arthritis of hand, unspecified laterality, unspecified rheumatoid factor presence (HCC)      STOP taking these medications     ondansetron (ZOFRAN) 4 MG tablet           Contact information for follow-up providers    Iran Planas, MD. Schedule an appointment as soon as possible for a visit in 2 week(s).   Specialty:  Orthopedic Surgery Why:  for repeat xrays, suture removal and application of short arm cast. Contact information: [address] [place] [address] [place] [address] [phone]            Contact information for after-discharge care    [facility] Follow up.   Specialty:  [place] information: [address] [place] [phone]                  TOTAL DISCHARGE TIME: 4 mins  [facility] Pager [phone]  12/20/2016, 3:46 PM

## 2016-12-20 NOTE — Progress Notes (Signed)
Occupational Therapy Treatment Patient Details Name: [name] MRN: [id number] DOB: [birth year] Today's Date: 12/20/2016    History of present illness Admitted with L wrist fx after fall landing on L wrist; PMH arthritis, HTN, fibromyalgia; uses a Opie at baseline   OT comments  Pt progressing towards goals, increased mobility this session. Pt completed in-room functional mobility with MinA and L platform [place], continues to require MaxA for LB ADLs secondary to LUE deficits. Educated on continued postioning of LUE to increase comfort and decrease edema. Pt will benefit from continued OT services to maximize safety and independence with ADLs and functional mobility. Will continue to follow acutely.    Follow Up Recommendations  SNF;Supervision/Assistance - 24 hour    Equipment Recommendations  Other (comment) (defer to next venue )          Precautions / Restrictions Precautions Precautions: Fall Required Braces or Orthoses: Sling Restrictions Weight Bearing Restrictions: Yes LUE Weight Bearing: Non weight bearing       Mobility Bed Mobility               General bed mobility comments: up with NT   Transfers Overall transfer level: Needs assistance Equipment used: Left platform [place] Transfers: Sit to/from Stand Sit to Stand: Min assist         General transfer comment: MinA to power up into standing; verbal cues for hand placement to assist with rise/descent     Balance Overall balance assessment: Needs assistance;History of Falls Sitting-balance support: Feet supported;No upper extremity supported Sitting balance-Leahy Scale: Fair     Standing balance support: Bilateral upper extremity supported Standing balance-Leahy Scale: Good                             ADL either performed or assessed with clinical judgement   ADL Overall ADL's : Needs assistance/impaired     Grooming: Wash/dry hands;Set up;Sitting                Lower Body Dressing: Maximal assistance;Sit to/from stand Lower Body Dressing Details (indicate cue type and reason): doffing/donning brief x2 times; doffing/donning socks Toilet Transfer: Minimal assistance;Ambulation;BSC;RW;Cueing for sequencing Toilet Transfer Details (indicate cue type and reason): BSC over toilet; cueing for hand placement  Toileting- Clothing Manipulation and Hygiene: Moderate assistance;Sit to/from stand Toileting - Clothing Manipulation Details (indicate cue type and reason): for toilet hygiene      Functional mobility during ADLs: Min guard;Rolling [place] (platform [place] ) General ADL Comments: Pt completed room level functional mobility, having to toilet 2x during session, seated ADLs                       Cognition Arousal/Alertness: Awake/alert Behavior During Therapy: WFL for tasks assessed/performed Overall Cognitive Status: Within Functional Limits for tasks assessed                                          Exercises Shoulder Exercises Shoulder Flexion: AAROM;10 reps;Left;Seated Digit Composite Flexion: AROM;10 reps;Left;Seated Composite Extension: AROM;10 reps;Left;Seated Hand Exercises Thumb Abduction: AROM;5 reps;Left;Seated Thumb Adduction: AROM;5 reps;Left;Seated Opposition: AROM;5 reps;Seated;Left          General Comments      Pertinent Vitals/ Pain       Pain Assessment: Faces Faces Pain Scale: Hurts little more Pain Location: L arm Pain Descriptors /  Indicators: Constant;Discomfort;Grimacing;Guarding Pain Intervention(s): Limited activity within patient's tolerance;Monitored during session;Repositioned  Home Living Family/patient expects to be discharged to:: Skilled nursing facility (rehab)                                        Prior Functioning/Environment              Frequency  Min 2X/week        Progress Toward Goals  OT Goals(current goals can now be found in the  care plan section)  Progress towards OT goals: Progressing toward goals  Acute Rehab OT Goals Patient Stated Goal: go home OT Goal Formulation: With patient Time For Goal Achievement: 01/02/17 Potential to Achieve Goals: Good  Plan Discharge plan remains appropriate    Co-evaluation                 AM-PAC PT "6 Clicks" Daily Activity     Outcome Measure   Help from another person eating meals?: A Little Help from another person taking care of personal grooming?: A Little Help from another person toileting, which includes using toliet, bedpan, or urinal?: A Lot Help from another person bathing (including washing, rinsing, drying)?: A Lot Help from another person to put on and taking off regular upper body clothing?: A Lot Help from another person to put on and taking off regular lower body clothing?: A Lot 6 Click Score: 14    End of Session Equipment Utilized During Treatment: Gait belt;Rolling [place] (L platform [place] )  OT Visit Diagnosis: Unsteadiness on feet (R26.81);Other abnormalities of gait and mobility (R26.89);Muscle weakness (generalized) (M62.81);Pain Pain - Right/Left: Left Pain - part of body: Arm   Activity Tolerance Patient tolerated treatment well   Patient Left in chair;with call bell/phone within reach;with nursing/sitter in room;with family/visitor present   Nurse Communication Mobility status;Other (comment) (sling updates )        Time: 3875-6433 OT Time Calculation (min): 57 min  Charges: OT General Charges $OT Visit: 1 Procedure OT Treatments $Self Care/Home Management : 23-37 mins $Therapeutic Exercise: 8-22 mins  Lou Cal, OT Pager [phone] 12/20/2016    Raymondo Band 12/20/2016, 4:31 PM

## 2016-12-20 NOTE — Patient Outreach (Signed)
[facility]) Care Management  12/20/2016  [name] 21-Feb-1933 [id number]   REFERRAL DATE: 08/11/16 REFERRAL SOURCE: primary MD  REFERRAL REASON: Hypertension, care coordination CONSENT: HIPAA verified with patient  PROVIDERS:  Dr. Lynne Leader - primary MD [place] - counselor Dr. Radford Pax - cardiology  SOCIAL: Patient lives with spouse. Patient states she and her husband both have health issues. Patient states she and her husband provide transportation for each other to doctor appointments.  ATTEMPT #1 Telephone call to patient for assessment.  Unable to reach patient. HIPAA compliant voice message left with call back phone number.   PLAN; RNCM will attempt 2nd telephone outreach to patient within 2 weeks.   Quinn Plowman RN,BSN,CCM [facility] Telephonic  [phone]

## 2016-12-20 NOTE — Progress Notes (Signed)
PT Cancellation Note  Patient Details Name: [name] MRN: [id number] DOB: 10-May-1933   Cancelled Treatment:    Reason Eval/Treat Not Completed: Fatigue/lethargy limiting ability to participate;Medical issues which prohibited therapy.  Patient reports she has had a rough day.  Reports diarrhea with OT, and now feeling fatigued.  Will return tomorrow for PT session.   Despina Pole 12/20/2016, 5:36 PM Carita Pian. Sanjuana Kava, [place] Pager [phone]

## 2016-12-20 NOTE — Progress Notes (Signed)
Orthopedic Tech Progress Note Patient Details:  [name] 1933-01-20 [id number]  Ortho Devices Type of Ortho Device: Shoulder immobilizer Ortho Device/Splint Location: applied sling/shoulder immobilizer to pt left arm.  pt tolerated well.  size Large.  Daughter at bedside.  (Floor nurse informed of appliation of Shoulder immobilizer). Ortho Device/Splint Interventions: Application, Adjustment   Kristopher Oppenheim 12/20/2016, 5:05 PM

## 2016-12-21 DIAGNOSIS — E876 Hypokalemia: Secondary | ICD-10-CM | POA: Diagnosis not present

## 2016-12-21 DIAGNOSIS — M1990 Unspecified osteoarthritis, unspecified site: Secondary | ICD-10-CM | POA: Diagnosis not present

## 2016-12-21 DIAGNOSIS — S5290XA Unspecified fracture of unspecified forearm, initial encounter for closed fracture: Secondary | ICD-10-CM | POA: Diagnosis not present

## 2016-12-21 DIAGNOSIS — W19XXXD Unspecified fall, subsequent encounter: Secondary | ICD-10-CM | POA: Diagnosis not present

## 2016-12-21 DIAGNOSIS — S62102D Fracture of unspecified carpal bone, left wrist, subsequent encounter for fracture with routine healing: Secondary | ICD-10-CM | POA: Diagnosis not present

## 2016-12-21 DIAGNOSIS — K219 Gastro-esophageal reflux disease without esophagitis: Secondary | ICD-10-CM | POA: Diagnosis not present

## 2016-12-21 DIAGNOSIS — I10 Essential (primary) hypertension: Secondary | ICD-10-CM | POA: Diagnosis not present

## 2016-12-21 DIAGNOSIS — J45909 Unspecified asthma, uncomplicated: Secondary | ICD-10-CM | POA: Diagnosis not present

## 2016-12-21 DIAGNOSIS — I129 Hypertensive chronic kidney disease with stage 1 through stage 4 chronic kidney disease, or unspecified chronic kidney disease: Secondary | ICD-10-CM | POA: Diagnosis not present

## 2016-12-21 DIAGNOSIS — E785 Hyperlipidemia, unspecified: Secondary | ICD-10-CM | POA: Diagnosis not present

## 2016-12-21 DIAGNOSIS — F329 Major depressive disorder, single episode, unspecified: Secondary | ICD-10-CM | POA: Diagnosis not present

## 2016-12-21 DIAGNOSIS — S62101A Fracture of unspecified carpal bone, right wrist, initial encounter for closed fracture: Secondary | ICD-10-CM | POA: Diagnosis not present

## 2016-12-21 DIAGNOSIS — F419 Anxiety disorder, unspecified: Secondary | ICD-10-CM | POA: Diagnosis not present

## 2016-12-21 DIAGNOSIS — S6990XA Unspecified injury of unspecified wrist, hand and finger(s), initial encounter: Secondary | ICD-10-CM | POA: Diagnosis not present

## 2016-12-21 DIAGNOSIS — Z96611 Presence of right artificial shoulder joint: Secondary | ICD-10-CM | POA: Diagnosis not present

## 2016-12-21 DIAGNOSIS — M797 Fibromyalgia: Secondary | ICD-10-CM | POA: Diagnosis not present

## 2016-12-21 DIAGNOSIS — Z96651 Presence of right artificial knee joint: Secondary | ICD-10-CM | POA: Diagnosis not present

## 2016-12-21 DIAGNOSIS — N183 Chronic kidney disease, stage 3 (moderate): Secondary | ICD-10-CM | POA: Diagnosis not present

## 2016-12-21 DIAGNOSIS — M8580 Other specified disorders of bone density and structure, unspecified site: Secondary | ICD-10-CM | POA: Diagnosis not present

## 2016-12-21 DIAGNOSIS — D72829 Elevated white blood cell count, unspecified: Secondary | ICD-10-CM | POA: Diagnosis not present

## 2016-12-21 DIAGNOSIS — M109 Gout, unspecified: Secondary | ICD-10-CM | POA: Diagnosis not present

## 2016-12-21 DIAGNOSIS — F410 Panic disorder [episodic paroxysmal anxiety] without agoraphobia: Secondary | ICD-10-CM | POA: Diagnosis not present

## 2016-12-21 DIAGNOSIS — G8929 Other chronic pain: Secondary | ICD-10-CM | POA: Diagnosis not present

## 2016-12-21 NOTE — Progress Notes (Signed)
Discharging patient to [facility] and Rehab. Report called to [phone], received by Chanel, RN; Prescriptions sent with written discharge paperwork.

## 2016-12-21 NOTE — Progress Notes (Signed)
PROGRESS NOTE    [name]  JEH:[id number] DOB: 05/06/1933 DOA: 12/17/2016 PCP: Gregor Hams, MD     Brief Narrative:  83 year old Caucasian female with a past medical history of chronic back pain, arthritis, fibromyalgia, hypertension, presented to the emergency department after sustaining a mechanical fall at home. This resulted in fracture of her left distal radius. She was hospitalized for further management. She underwent surgery.  Assessment & Plan:   Active Problems:   Fracture of left wrist   Left wrist fracture   Patient has remained stable, no mayor events overnight. Pain and blood pressure continue to be well controlled. Will plan to discharge patient today to skill nursing facility. Discharge medications and last blood worked were reviewed. All questions were addressed. Patient's daughter at the bedside.       Consultants:   Orthopedics  Procedures:    Antimicrobials:      Subjective: Patient feeling well, no nausea or vomiting, no dyspnea or chest pain. Left upper extremity pain controlled.   Objective: Vitals:   12/20/16 1300 12/20/16 2234 12/21/16 0400 12/21/16 0600  BP: 128/67 130/71 (!) 146/74 (!) 145/61  Pulse: 85 (!) 102 86 82  Resp: 14 16 18 18   Temp: 98.2 F (36.8 C) 98.7 F (37.1 C) 97.5 F (36.4 C)   TempSrc: Oral Oral Oral   SpO2: 94% 97% 95% 97%    Intake/Output Summary (Last 24 hours) at 12/21/16 1054 Last data filed at 12/21/16 0645  Gross per 24 hour  Intake              360 ml  Output                0 ml  Net              360 ml   There were no vitals filed for this visit.  Examination:  General exam: Appears calm and comfortable, not in pain or dyspnea.   Respiratory system: Clear to auscultation. Respiratory effort normal. Cardiovascular system: S1 & S2 heard, RRR. No JVD, murmurs, rubs, gallops or clicks. No pedal edema. Gastrointestinal system: Abdomen is nondistended, soft and nontender. No organomegaly or  masses felt. Normal bowel sounds heard. Central nervous system: Alert and oriented. No focal neurological deficits. Extremities: Symmetric 5 x 5 power. Left upper extremity on a sling with mild to moderate finger and hand non pitting edema.  Skin: No rashes, lesions or ulcers      Data Reviewed: I have personally reviewed following labs and imaging studies  CBC:  Recent Labs Lab 12/18/16 0010 12/19/16 0625 12/20/16 0718  WBC 17.4* 13.5* 11.8*  NEUTROABS 13.9*  --   --   HGB 14.2 13.1 13.5  HCT 42.7 39.8 42.6  MCV 90.5 91.5 93.6  PLT 314 266 378   Basic Metabolic Panel:  Recent Labs Lab 12/18/16 0010 12/19/16 0625 12/19/16 1421 12/19/16 2015 12/20/16 0718  NA 133* 133* 131* 132* 135  K 3.6 2.9* 5.3* 3.4* 3.7  CL 96* 94* 96* 97* 97*  CO2 24 28 24 25 29   GLUCOSE 123* 118* 134* 154* 97  BUN 15 8 12 13 10   CREATININE 1.32* 0.92 1.08* 1.09* 0.95  CALCIUM 8.8* 8.1* 8.1* 8.2* 8.6*  MG  --  1.1*  --   --  2.0   GFR: CrCl cannot be calculated (Unknown ideal weight.). Liver Function Tests: No results for input(s): AST, ALT, ALKPHOS, BILITOT, PROT, ALBUMIN in the last 168 hours. No results  for input(s): LIPASE, AMYLASE in the last 168 hours. No results for input(s): AMMONIA in the last 168 hours. Coagulation Profile:  Recent Labs Lab 12/18/16 0010  INR 1.11   Cardiac Enzymes: No results for input(s): CKTOTAL, CKMB, CKMBINDEX, TROPONINI in the last 168 hours. BNP (last 3 results) No results for input(s): PROBNP in the last 8760 hours. HbA1C: No results for input(s): HGBA1C in the last 72 hours. CBG: No results for input(s): GLUCAP in the last 168 hours. Lipid Profile: No results for input(s): CHOL, HDL, LDLCALC, TRIG, CHOLHDL, LDLDIRECT in the last 72 hours. Thyroid Function Tests: No results for input(s): TSH, T4TOTAL, FREET4, T3FREE, THYROIDAB in the last 72 hours. Anemia Panel: No results for input(s): VITAMINB12, FOLATE, FERRITIN, TIBC, IRON, RETICCTPCT in  the last 72 hours. Sepsis Labs: No results for input(s): PROCALCITON, LATICACIDVEN in the last 168 hours.  No results found for this or any previous visit (from the past 240 hour(s)).       Radiology Studies: No results found.      Scheduled Meds: . amLODipine  10 mg Oral Daily  . calcium-vitamin D  1 tablet Oral BID  . diclofenac sodium  1 application Topical QID  . fentaNYL  12.5 mcg Transdermal Q72H  . hydrochlorothiazide  25 mg Oral Daily  . montelukast  10 mg Oral QHS  . polyethylene glycol  17 g Oral Daily  . potassium chloride  20 mEq Oral Daily  . simvastatin  10 mg Oral q1800  . venlafaxine XR  37.5 mg Oral Q breakfast   Continuous Infusions:   LOS: 3 days      Charlisa Cham Gerome Apley, MD [facility] Pager [phone]  If 7PM-7AM, please contact night-coverage [email or web address] Password [facility] 12/21/2016, 10:54 AM

## 2016-12-21 NOTE — Progress Notes (Signed)
Left unit via stretcher with EMS ([facility]). Zofran 8mg  given before transport. Daughters following to [facility] and Rehab. Prescriptions sent with transport. All questions answered Report called previously to [facility], accepted by [employer], RN

## 2016-12-21 NOTE — Clinical Social Work Placement (Signed)
   CLINICAL SOCIAL WORK PLACEMENT  NOTE  Date:  12/21/2016  Patient Details  Name: [name] MRN: [id number] Date of Birth: 28-Jul-1933  Clinical Social Work is seeking post-discharge placement for this patient at the [place] level of care (*CSW will initial, date and re-position this form in  chart as items are completed):  Yes   Patient/family provided with [place] Work Department's list of facilities offering this level of care within the geographic area requested by the patient (or if unable, by the patient's family).  Yes   Patient/family informed of their freedom to choose among providers that offer the needed level of care, that participate in Medicare, Medicaid or managed care program needed by the patient, have an available bed and are willing to accept the patient.  Yes   Patient/family informed of [place]'s ownership interest in [facility] and [facility], as well as of the fact that they are under no obligation to receive care at these facilities.  PASRR submitted to EDS on       PASRR number received on 12/19/16     Existing PASRR number confirmed on 12/19/16     FL2 transmitted to all facilities in geographic area requested by pt/family on 12/19/16     FL2 transmitted to all facilities within larger geographic area on 12/19/16     Patient informed that his/her managed care company has contracts with or will negotiate with certain facilities, including the following:        Yes   Patient/family informed of bed offers received.  Patient chooses bed at  [facility])     Physician recommends and patient chooses bed at      Patient to be transferred to  [facility]) on 12/21/16.  Patient to be transferred to facility by [facility]     Patient family notified on 12/21/16 of transfer.  Name of family member notified:  Spouse, adult children     PHYSICIAN Please prepare priority discharge summary, including  medications, Please prepare prescriptions, Please sign FL2     Additional Comment:    _______________________________________________ [name], [name] 12/21/2016, 8:26 AM

## 2016-12-21 NOTE — Clinical Social Work Note (Addendum)
Clinical Social Worker facilitated patient discharge including contacting patient family and facility to confirm patient discharge plans.  Clinical information faxed to facility and family agreeable with plan.  CSW arranged ambulance transport via [facility] to [facility] Re[facility].  RN to call [phone] report prior to discharge.  Clinical Social Worker will sign off for now as social work intervention is no longer needed. Please consult [place] again if new need arises.  Elissa Hefty, [name] Clinical Social Worker [phone]

## 2016-12-21 NOTE — Progress Notes (Signed)
PT Cancellation Note  Patient Details Name: [name] MRN: [id number] DOB: 06-01-1933   Cancelled Treatment:    Reason Eval/Treat Not Completed: Other (comment) (Pt has been up per pt and family. D/C to SNF shortly. Declined PT.)Thanks.    Godfrey Pick Babetta Paterson 12/21/2016, 1:46 PM  [facility] [phone] (pager)

## 2016-12-23 ENCOUNTER — Other Ambulatory Visit: Payer: Self-pay

## 2016-12-23 NOTE — Patient Outreach (Signed)
[facility]) Care Management  12/23/2016  DEBORAH LAZCANO 22-May-1933 [id number]  Second call to patient for telephone assessment. Contact answer phone states patient is in a rehab facility in  [place], [place]. States patient fell and broke her wrist approximately 1 week ago.   Upon chart review patient  Had open reduction internal fixation of left wrist on 12/13/16.  Patient discharged to [facility] and rehab center in [place].  PLAN; RNCM will refer patient to [facility] social worker to follow up while patient is in skilled nursing facility.   Quinn Plowman RN,BSN,CCM [facility] Telephonic  [phone]

## 2016-12-27 ENCOUNTER — Encounter: Payer: Self-pay | Admitting: *Deleted

## 2016-12-29 ENCOUNTER — Ambulatory Visit: Payer: Self-pay | Admitting: Cardiology

## 2016-12-29 ENCOUNTER — Other Ambulatory Visit: Payer: Self-pay

## 2016-12-29 NOTE — Patient Outreach (Signed)
[facility]) Care Management  12/29/2016  [name] 11/03/1932 [id number]   Telephone call to [name] at [facility] and Rehab center for update on patients inpatient status. Message left for [facility] requesting return call to this RNCM with update.   PLAN; RNCM will await call from [name]. If no return call will attempt to contact [name] within 3 business days.   Quinn Plowman RN,BSN,CCM [facility] Telephonic  [phone]

## 2016-12-30 ENCOUNTER — Other Ambulatory Visit: Payer: Self-pay

## 2016-12-30 DIAGNOSIS — I10 Essential (primary) hypertension: Secondary | ICD-10-CM | POA: Diagnosis not present

## 2016-12-30 DIAGNOSIS — S62101A Fracture of unspecified carpal bone, right wrist, initial encounter for closed fracture: Secondary | ICD-10-CM | POA: Diagnosis not present

## 2016-12-30 DIAGNOSIS — G8929 Other chronic pain: Secondary | ICD-10-CM | POA: Diagnosis not present

## 2016-12-30 NOTE — Patient Outreach (Signed)
[name] Care Management  12/30/2016  [name] [birth year] [id number]  Care coordination update: Telephone message received from Izell Hebron stating patient is currently at [facility] and Rehab center.  PLAN:  RNCM will follow up within 1 week to determine patients discharge disposition.  Quinn Plowman RN,BSN,CCM [facility] Telephonic  [phone]

## 2016-12-31 DIAGNOSIS — M791 Myalgia: Secondary | ICD-10-CM | POA: Diagnosis not present

## 2016-12-31 DIAGNOSIS — G8929 Other chronic pain: Secondary | ICD-10-CM | POA: Diagnosis not present

## 2016-12-31 DIAGNOSIS — N183 Chronic kidney disease, stage 3 (moderate): Secondary | ICD-10-CM | POA: Diagnosis not present

## 2016-12-31 DIAGNOSIS — M545 Low back pain: Secondary | ICD-10-CM | POA: Diagnosis not present

## 2016-12-31 DIAGNOSIS — M1612 Unilateral primary osteoarthritis, left hip: Secondary | ICD-10-CM | POA: Diagnosis not present

## 2016-12-31 DIAGNOSIS — I129 Hypertensive chronic kidney disease with stage 1 through stage 4 chronic kidney disease, or unspecified chronic kidney disease: Secondary | ICD-10-CM | POA: Diagnosis not present

## 2017-01-02 ENCOUNTER — Other Ambulatory Visit: Payer: Self-pay | Admitting: *Deleted

## 2017-01-02 ENCOUNTER — Ambulatory Visit: Payer: Self-pay | Admitting: Family Medicine

## 2017-01-02 DIAGNOSIS — S52572D Other intraarticular fracture of lower end of left radius, subsequent encounter for closed fracture with routine healing: Secondary | ICD-10-CM | POA: Diagnosis not present

## 2017-01-02 DIAGNOSIS — Z4789 Encounter for other orthopedic aftercare: Secondary | ICD-10-CM | POA: Diagnosis not present

## 2017-01-02 NOTE — Patient Outreach (Signed)
[facility]) Care Management  01/02/2017  [name] 1933/05/22 [id number]   CSW made an initial attempt to try and contact patient today to perform phone assessment, as well as assess and assist with social needs and services, without success.  A HIPAA compliant message was left for patient on voicemail.  CSW is currently awaiting a return call. CSW will make a second outreach attempt within the next week, if CSW does not receive a return call from patient in the meantime. Nat Christen, BSW, [name], [name]  Licensed Education officer, [facility]  Mailing [place] [address] [place], [place] [address] Physical Address-[address], [place], [place] [address] Toll Free Main # [phone] Fax # [phone] Cell # [phone]  Office # [phone] [email or web address]

## 2017-01-03 ENCOUNTER — Ambulatory Visit (INDEPENDENT_AMBULATORY_CARE_PROVIDER_SITE_OTHER): Payer: Medicare Other | Admitting: Family Medicine

## 2017-01-03 ENCOUNTER — Encounter: Payer: Self-pay | Admitting: Family Medicine

## 2017-01-03 ENCOUNTER — Telehealth: Payer: Self-pay | Admitting: *Deleted

## 2017-01-03 VITALS — BP 128/65 | HR 111

## 2017-01-03 DIAGNOSIS — S62102A Fracture of unspecified carpal bone, left wrist, initial encounter for closed fracture: Secondary | ICD-10-CM

## 2017-01-03 DIAGNOSIS — F329 Major depressive disorder, single episode, unspecified: Secondary | ICD-10-CM | POA: Diagnosis not present

## 2017-01-03 DIAGNOSIS — R531 Weakness: Secondary | ICD-10-CM

## 2017-01-03 DIAGNOSIS — M545 Low back pain, unspecified: Secondary | ICD-10-CM

## 2017-01-03 DIAGNOSIS — G8929 Other chronic pain: Secondary | ICD-10-CM | POA: Diagnosis not present

## 2017-01-03 DIAGNOSIS — F419 Anxiety disorder, unspecified: Secondary | ICD-10-CM | POA: Diagnosis not present

## 2017-01-03 DIAGNOSIS — I10 Essential (primary) hypertension: Secondary | ICD-10-CM | POA: Diagnosis not present

## 2017-01-03 DIAGNOSIS — N183 Chronic kidney disease, stage 3 (moderate): Secondary | ICD-10-CM | POA: Diagnosis not present

## 2017-01-03 DIAGNOSIS — M791 Myalgia: Secondary | ICD-10-CM | POA: Diagnosis not present

## 2017-01-03 DIAGNOSIS — I129 Hypertensive chronic kidney disease with stage 1 through stage 4 chronic kidney disease, or unspecified chronic kidney disease: Secondary | ICD-10-CM | POA: Diagnosis not present

## 2017-01-03 DIAGNOSIS — M1612 Unilateral primary osteoarthritis, left hip: Secondary | ICD-10-CM | POA: Diagnosis not present

## 2017-01-03 NOTE — Progress Notes (Signed)
[name] is a 83 y.o. female who presents to [facility] [place] today for follow-up recent hospitalization and nursing home stay.  Tamaria fell and broke her left wrist a few weeks ago. She was admitted to [facility] where she had ORIF of the distal radius performed by Dr. Caralyn Guile.  She subsequently was admitted to a nursing home in town for rehabilitation. She was discharged from the nursing home at her request and by effort from her family and myself on Friday (June 1st). At home she is using a Attar with a platform to ambulate. She was followed up with her orthopedic surgeon yesterday we transition to a short arm cast. She is partially dependent on her husband for some activities of daily living. He is also partially dependent on her for some activities of daily living as well. She notes slight increase in pain with increased physical therapy activities. She is receiving home health physical and occupational therapy. She receives oxycodone and if it through pain management. She notes that her mobility has declined recently. She has trouble driving as does her husband. She wishes to transfer her chronic pain needs to this office. She takes 12.5 mg at patch every 3 days and up to four 10 mg oxycodone daily. She typically takes 2 or 3 a day.   Past Medical History:  Diagnosis Date  . Anxiety   . Arthritis    osteoarthritis  . Asthma   . C. difficile diarrhea   . Candida esophagitis ([place])   . Chronic sinusitis   . Depression   . Depression   . Diverticulosis   . DJD (degenerative joint disease)     L5 compression fracture  . Edema extremities   . Fibromyalgia   . GERD (gastroesophageal reflux disease)   . Gout   . Hyperlipidemia   . Hypertension   . Osteoarthritis   . Osteopenia   . Panic disorder   . Renal failure, unspecified   . Spinal stenosis of  lumbar region   . Tubular adenoma of colon    Past Surgical History:  Procedure Laterality Date  . ADENOIDECTOMY    . CARPAL TUNNEL RELEASE  2007, 2009   Bilateral  . CATARACT EXTRACTION, BILATERAL    . CHOANAL ADENIODECTOMY    . DIAGNOSTIC LARYNGOSCOPY N/A 11/05/2015   Procedure: DIAGNOSTIC LARYNGOSCOPY AND ESOPHAGOSCOPY;  Surgeon: Rozetta Nunnery, MD;  Location: [facility];  Service: ENT;  Laterality: N/A;  . ganglion cyct removal on fingers     right  . ganglion cyst  wrist    . KNEE ARTHROSCOPY     right  . NASAL SINUS SURGERY    . OPEN REDUCTION INTERNAL FIXATION (ORIF) DISTAL RADIAL FRACTURE Left 12/18/2016   Procedure: OPEN REDUCTION INTERNAL FIXATION (ORIF) DISTAL RADIAL FRACTURE;  Surgeon: Iran Planas, MD;  Location: [place];  Service: Orthopedics;  Laterality: Left;  . SHOULDER SURGERY  2004, 07/30/10, 01/2011   after her right humeral neck fracture, revision hemiarthroplasty 2004 for persistent pain, revision reverse arthroplasty December 2011 for persistent pain, incision and drainage with poly-exchange 01/26/2011 for possible prosthetic joint infection.  . TONSILLECTOMY AND ADENOIDECTOMY    . TOTAL ABDOMINAL HYSTERECTOMY    . TOTAL HIP ARTHROPLASTY  2004   right  . TOTAL KNEE ARTHROPLASTY  2006   right  . TOTAL SHOULDER REPLACEMENT  2002   right   Social History  Substance Use Topics  .  Smoking status: Never Smoker  . Smokeless tobacco: Never Used  . Alcohol use No   family history includes Bladder Cancer in her father; Depression in her son; Diabetes in her mother; Hypertension in her mother.  ROS as above:  Medications: Current Outpatient Prescriptions  Medication Sig Dispense Refill  . ALPRAZolam (XANAX) 0.5 MG tablet Take 1 tablet (0.5 mg total) by mouth 2 (two) times daily as needed for anxiety. 15 tablet 0  . AMBULATORY NON FORMULARY MEDICATION [place].  Dx: Rheumatoid Arthritis.  Use daily as needed for ambulation. 1 Units 0  . amLODipine  (NORVASC) 10 MG tablet take 1 tablet by mouth once daily 90 tablet 2  . Calcium Carbonate-Vitamin D (CALCIUM 600+D) 600-400 MG-UNIT per tablet Take 1 tablet by mouth 2 (two) times daily.    [name] Desvenlafaxine Succinate ER 25 MG TB24 Take 25 mg by mouth daily.  0  . diclofenac sodium (VOLTAREN) 1 % GEL Apply 1 application topically 4 (four) times daily.  0  . diphenhydrAMINE (BENADRYL) 25 MG tablet Take 25 mg by mouth every 6 (six) hours as needed. For allergies    . EPINEPHrine (ADRENACLICK) 0.3 HU/7.6 mL IJ SOAJ injection Inject 0.3 mLs (0.3 mg total) into the muscle once. 1 Device 1  . fentaNYL (DURAGESIC - DOSED MCG/HR) 12 MCG/HR Place 1 patch (12.5 mcg total) onto the skin every 3 (three) days. 5 patch 0  . hydrochlorothiazide (HYDRODIURIL) 25 MG tablet take 1 tablet by mouth every morning for blood pressure 90 tablet 1  . ipratropium (ATROVENT) 0.06 % nasal spray Place 2 sprays into both nostrils 3 (three) times daily as needed for rhinitis. 15 mL 12  . montelukast (SINGULAIR) 10 MG tablet take 1 tablet by mouth at bedtime 30 tablet 3  . omeprazole (PRILOSEC) 20 MG capsule Take 20 mg by mouth daily.  0  . Oxycodone HCl 10 MG TABS Take 1 tablet (10 mg total) by mouth 4 (four) times daily as needed (for pain). 20 tablet 0  . polyethylene glycol (MIRALAX / GLYCOLAX) packet Take 17 g by mouth daily. 14 each 0  . potassium chloride SA (K-DUR,KLOR-CON) 20 MEQ tablet take 1 tablet by mouth once daily 30 tablet 1  . simvastatin (ZOCOR) 10 MG tablet Take 1 tablet (10 mg total) by mouth daily. 90 tablet 1  . zoledronic acid (RECLAST) 5 MG/100ML SOLN Inject 5 mg into the vein. Once yearly. In June     No current facility-administered medications for this visit.    Allergies  Allergen Reactions  . Bee Venom Anaphylaxis    Has epi-pen  . Ceftin [Cefuroxime Axetil] Anaphylaxis  . Ciprofloxacin Anaphylaxis and Palpitations  . Ephedrine Other (See Comments) and Palpitations    "knocks me out"  .  Sulfonamide Derivatives Anaphylaxis  . Telithromycin Anaphylaxis    Reaction: also blurred vision   . Valsartan Anaphylaxis  . Verapamil Anaphylaxis  . Venlafaxine Other (See Comments)    Insomnia, panic  . Beta Adrenergic Blockers     bradycardia  . Cephalosporins     Allergic Reaction  . Clindamycin/Lincomycin     Dry skin and itching/ Cdiff  . Cymbalta [Duloxetine Hcl] Other (See Comments)    Reaction:Confusion and " I fall down"  . Doxazosin     "blurred vision and irritable"  . Gabapentin Other (See Comments)    Reaction: headache  . Hydralazine     HA, Diarrhea  . Lamotrigine     Patient unsure at this  time  . Mirtazapine Other (See Comments)    unknown  . Morphine Other (See Comments)    Medication has no effect with pain  . Oxycodone-Acetaminophen Hives    Takes plain oxy IR at home (May 2018)  . Pregabalin Swelling  . Prochlorperazine Edisylate   . Pseudoephedrine Other (See Comments)    Reaction: hyperventilates and blacks out  . Relafen [Nabumetone] Swelling  . Zoloft [Sertraline] Other (See Comments)    "nervous/jittery"  . Doxycycline Hives    Health Maintenance Health Maintenance  Topic Date Due  . PNA vac Low Risk Adult (2 of 2 - PPSV23) 12/29/2016  . INFLUENZA VACCINE  03/01/2017  . TETANUS/TDAP  02/20/2020  . DEXA SCAN  Completed     Exam:  BP 128/65   Pulse (!) 111   SpO2 93%  Gen: Well NAD In a wheelchair HEENT: EOMI,  MMM Lungs: Normal work of breathing. CTABL Heart: RRR no MRG Abd: NABS, Soft. Nondistended, Nontender Exts: Brisk capillary refill, warm and well perfused. Left arm in a short arm cast. Extremities look well with normal capillary refill and sensation.   No results found for this or any previous visit (from the past 72 hour(s)). No results found.    Assessment and Plan: 83 y.o. female with  Nursing home follow-up. Patient is starting to receive home physical and occupational therapy which I think is reasonable. She  seems to be doing okay but is obviously vulnerable for fall and repeat fracture. I think she would be best served with an assisted-living facility although she declines that at this time. Plan to continue home health physical therapy.  I will take over pain management needs. We'll prescribe medication as needed.   Recheck later next week  Nursing home notes reviewed. Hospital records reviewed. No orders of the defined types were placed in this encounter.  No orders of the defined types were placed in this encounter.    Discussed warning signs or symptoms. Please see discharge instructions. Patient expresses understanding.  I spent 40 minutes with this patient, greater than 50% was face-to-face time counseling regarding the above diagnosis.

## 2017-01-03 NOTE — Patient Instructions (Addendum)
Thank you for coming in today. Recheck in 2 weeks or so.  Return sooner if needed.   I will take over pain medicines.   Please take 650 mg of regular tylenol every 6 hours as needed for pain.

## 2017-01-04 DIAGNOSIS — I129 Hypertensive chronic kidney disease with stage 1 through stage 4 chronic kidney disease, or unspecified chronic kidney disease: Secondary | ICD-10-CM | POA: Diagnosis not present

## 2017-01-04 DIAGNOSIS — M791 Myalgia: Secondary | ICD-10-CM | POA: Diagnosis not present

## 2017-01-04 DIAGNOSIS — N183 Chronic kidney disease, stage 3 (moderate): Secondary | ICD-10-CM | POA: Diagnosis not present

## 2017-01-04 DIAGNOSIS — M1612 Unilateral primary osteoarthritis, left hip: Secondary | ICD-10-CM | POA: Diagnosis not present

## 2017-01-04 DIAGNOSIS — G8929 Other chronic pain: Secondary | ICD-10-CM | POA: Diagnosis not present

## 2017-01-04 DIAGNOSIS — M545 Low back pain: Secondary | ICD-10-CM | POA: Diagnosis not present

## 2017-01-05 ENCOUNTER — Ambulatory Visit: Payer: Self-pay | Admitting: Family Medicine

## 2017-01-05 ENCOUNTER — Telehealth: Payer: Self-pay

## 2017-01-05 ENCOUNTER — Other Ambulatory Visit: Payer: Self-pay

## 2017-01-05 ENCOUNTER — Other Ambulatory Visit: Payer: Self-pay | Admitting: *Deleted

## 2017-01-05 NOTE — Addendum Note (Signed)
Addended by: Quinn Plowman on: 01/05/2017 06:21 PM   Modules accepted: Orders

## 2017-01-05 NOTE — Telephone Encounter (Signed)
Amy a PT assistant with [facility] called with a FYI stating that Brexlee fell on 01/03/17 but had no injuries.

## 2017-01-05 NOTE — Patient Outreach (Signed)
[facility]) Care Management  01/05/2017  [name] 08/29/1932 [id number]   Incoming call from patient was received by Kennyth Lose, Care Management Assistant with [facility], in CSW's absence.  According to [name], patient called the office indicating that she would like to cancel her appointment with CSW for June 11th, as she no longer resides at [facility] [place] where patient was residing to receive short-term rehabilitative services, having been discharged back home on Friday, June 1st.  CSW was able to make initial contact with patient today to assess and assist with social work needs and services, as well as assess need for continued social work involvement.  Patient denies having any social work needs at present.  CSW will perform a case closure on patient, as all goals of treatment have been met from social work standpoint and no additional social work needs have been identified at this time.  CSW will notify patient's Telephonic RNCM with [facility], Quinn Plowman of CSW's plans to close patient's case.  CSW will fax an update to patient's Primary Care Physician, Dr. Lynne Leader to ensure that they are aware of CSW's involvement with patient's plan of care.  CSW will submit a case closure request to Verlon Setting, Care Management Assistant with [facility], in the form of an [employer].  CSW will ensure that Mrs. Comer is aware of Mrs. Green's, RNCM with [facility], continued involvement with patient's care. Nat Christen, BSW, [name], [name]  Licensed Education officer, environmental Health System  Mailing [place] [address], [place], [place] [address] Physical Address-[address], [place], [place] [address] Toll Free Main # [phone] Fax #  [phone] Cell # [phone]  Office # [phone] [email or web address]

## 2017-01-05 NOTE — Patient Outreach (Signed)
[facility]) Care Management  01/05/2017  [name] 04-21-1933 [id number]  Transition of care:  Hospital course 5/19/ 18 to 12/20/16 Skilled nursing facility 12/20/16 to 12/30/16 Status post left wrist fracture, ORIF of left wrist  Update received from [facility] that patient has been discharged from the skilled nursing facility .   Telephone call to patient for transition of care follow up. HIPAA verified by patient. Patient states things did not go well for her in the skilled nursing facility.  Patient states she had panic attacks and was unable to benefit from the therapies. Patient states her primary MD discharged her home on 12/30/16 with home health services for physical therapy, occupational therapy and home health aid.  Patient reports her air conditioner has been out since she has been home and she is waiting for a loaner.  Patient reports her left wrist has been healing well. States she saw the surgeon on 01/02/17 and his has removed her sutures and place her wrist in a hard cast. Patient states she has a visit with her primary MD on Friday 6/ 15/18.   Patient states her biggest problem now is being able to prepare food for herself. Patient states she does not have much strength in her right shoulder due to having surgery in the past on her right shoulder and now surgery with her left wrist.  Patient states her husband is unable to do much for her. Patient states her daughter was with her for a few days after she was discharged from the nursing facility.  Patient unsure when her daughter will be able to assist her again. Patient reports she is scheduled to have an assessment with [facility] on 01/13/17.  [facility]  her transportation as well as her son when he is able to. Patient states her primary MD, Dr. Georgina Snell will be managing her pain from now on. Patient states she is not able to managing getting back and forth to her pain  management doctor in [place].   ASSESSMENT:    Admit date: 12/17/2016 Discharge date: 12/20/2016  PCP: Gregor Hams, MD  DISCHARGE DIAGNOSES:  Active Problems:   Fracture of left wrist   Left wrist fracture  83 year old  female with a past medical history of chronic back pain, arthritis, fibromyalgia, hypertension, presented to the emergency department after sustaining a mechanical fall at home. This resulted in fracture of her left distal radius. She was hospitalized for further management. She underwent surgery.  PLAN:  RNCM will refer patient to [facility] care management social worker.  RNCM will follow up with patient within 1 week.   Quinn Plowman RN,BSN,CCM [facility] Telephonic  [phone]

## 2017-01-05 NOTE — Telephone Encounter (Signed)
[name] with OT [facility] Would like a VO for continue OT 1 x this week and 2X the next 4 weeks as well as a nurse to evaluate medication administration.  I have talked with you, and I will call and give this order.

## 2017-01-06 ENCOUNTER — Other Ambulatory Visit: Payer: Self-pay | Admitting: *Deleted

## 2017-01-06 DIAGNOSIS — M1612 Unilateral primary osteoarthritis, left hip: Secondary | ICD-10-CM | POA: Diagnosis not present

## 2017-01-06 DIAGNOSIS — N183 Chronic kidney disease, stage 3 (moderate): Secondary | ICD-10-CM | POA: Diagnosis not present

## 2017-01-06 DIAGNOSIS — M545 Low back pain: Secondary | ICD-10-CM | POA: Diagnosis not present

## 2017-01-06 DIAGNOSIS — I129 Hypertensive chronic kidney disease with stage 1 through stage 4 chronic kidney disease, or unspecified chronic kidney disease: Secondary | ICD-10-CM | POA: Diagnosis not present

## 2017-01-06 DIAGNOSIS — G8929 Other chronic pain: Secondary | ICD-10-CM | POA: Diagnosis not present

## 2017-01-06 DIAGNOSIS — M791 Myalgia: Secondary | ICD-10-CM | POA: Diagnosis not present

## 2017-01-06 NOTE — Patient Outreach (Signed)
[facility]) Care Management  01/06/2017  [name] [birth year] [id number]   CSW was able to make contact with patient today, after receiving a voicemail message from Quinn Plowman, Telephonic RNCM with [facility], indicating that patient has no food in the home to eat. Patient denied needing any type of assistance when talking with CSW, less than 48 hours prior. Patient indicated that her daughter was coming to visit for the weekend and that she would be shopping at the grocery store for patient, as well as preparing meals.  Patient went on to say that she is being evaluated by [employer] through [employer] [facility] [place] on the 15th of June, and that she believed she had enough food to last her until the evaluation. CSW is aware that patient has a broken wrist, making it difficult for her to prepare meals. However, patient eluded to the fact that she is able to make herself instant grits, sandwiches, soups, yogurt, etc. To ensure that patient has enough food to last her for a week, CSW agreed to request a $50.00 Visa gift card through the [facility] to purchase patient food. CSW will deliver the food to patient's home today.  CSW also agreed to make a referral for patient to [employer], at the expense of [facility], so that patient can receive a 30-day supply of meals, at no expense to her.  The meals will be delivered directly to patient's home, hot and ready to eat. Patient voiced understanding and was agreeable to this plan.  Patient was most appreciative of the services provided by Mrs. Green, as well as CSW. Nat Christen, BSW, [name], [name]  Licensed Education officer, environmental Health System  Mailing [place] [address] [place], [place] [address] Physical Address-[address], [place], [place] [address] Toll Free Main # [phone] Fax #  [phone] Cell # [phone]  Office # [phone] [email or web address]

## 2017-01-09 ENCOUNTER — Other Ambulatory Visit: Payer: Self-pay | Admitting: Physician Assistant

## 2017-01-09 ENCOUNTER — Ambulatory Visit: Payer: Self-pay | Admitting: *Deleted

## 2017-01-09 DIAGNOSIS — M1612 Unilateral primary osteoarthritis, left hip: Secondary | ICD-10-CM | POA: Diagnosis not present

## 2017-01-09 DIAGNOSIS — I129 Hypertensive chronic kidney disease with stage 1 through stage 4 chronic kidney disease, or unspecified chronic kidney disease: Secondary | ICD-10-CM | POA: Diagnosis not present

## 2017-01-09 DIAGNOSIS — M545 Low back pain: Secondary | ICD-10-CM | POA: Diagnosis not present

## 2017-01-09 DIAGNOSIS — N183 Chronic kidney disease, stage 3 (moderate): Secondary | ICD-10-CM | POA: Diagnosis not present

## 2017-01-09 DIAGNOSIS — G8929 Other chronic pain: Secondary | ICD-10-CM | POA: Diagnosis not present

## 2017-01-09 DIAGNOSIS — M791 Myalgia: Secondary | ICD-10-CM | POA: Diagnosis not present

## 2017-01-10 DIAGNOSIS — N183 Chronic kidney disease, stage 3 (moderate): Secondary | ICD-10-CM | POA: Diagnosis not present

## 2017-01-10 DIAGNOSIS — G8929 Other chronic pain: Secondary | ICD-10-CM | POA: Diagnosis not present

## 2017-01-10 DIAGNOSIS — M791 Myalgia: Secondary | ICD-10-CM | POA: Diagnosis not present

## 2017-01-10 DIAGNOSIS — M545 Low back pain: Secondary | ICD-10-CM | POA: Diagnosis not present

## 2017-01-10 DIAGNOSIS — I129 Hypertensive chronic kidney disease with stage 1 through stage 4 chronic kidney disease, or unspecified chronic kidney disease: Secondary | ICD-10-CM | POA: Diagnosis not present

## 2017-01-10 DIAGNOSIS — M1612 Unilateral primary osteoarthritis, left hip: Secondary | ICD-10-CM | POA: Diagnosis not present

## 2017-01-10 NOTE — Telephone Encounter (Signed)
This encounter was created in error - please disregard.

## 2017-01-11 DIAGNOSIS — N183 Chronic kidney disease, stage 3 (moderate): Secondary | ICD-10-CM | POA: Diagnosis not present

## 2017-01-11 DIAGNOSIS — M545 Low back pain: Secondary | ICD-10-CM | POA: Diagnosis not present

## 2017-01-11 DIAGNOSIS — G8929 Other chronic pain: Secondary | ICD-10-CM | POA: Diagnosis not present

## 2017-01-11 DIAGNOSIS — M1612 Unilateral primary osteoarthritis, left hip: Secondary | ICD-10-CM | POA: Diagnosis not present

## 2017-01-11 DIAGNOSIS — I129 Hypertensive chronic kidney disease with stage 1 through stage 4 chronic kidney disease, or unspecified chronic kidney disease: Secondary | ICD-10-CM | POA: Diagnosis not present

## 2017-01-11 DIAGNOSIS — M791 Myalgia: Secondary | ICD-10-CM | POA: Diagnosis not present

## 2017-01-12 ENCOUNTER — Other Ambulatory Visit: Payer: Self-pay

## 2017-01-12 DIAGNOSIS — M791 Myalgia: Secondary | ICD-10-CM | POA: Diagnosis not present

## 2017-01-12 DIAGNOSIS — G8929 Other chronic pain: Secondary | ICD-10-CM | POA: Diagnosis not present

## 2017-01-12 DIAGNOSIS — I129 Hypertensive chronic kidney disease with stage 1 through stage 4 chronic kidney disease, or unspecified chronic kidney disease: Secondary | ICD-10-CM | POA: Diagnosis not present

## 2017-01-12 DIAGNOSIS — N183 Chronic kidney disease, stage 3 (moderate): Secondary | ICD-10-CM | POA: Diagnosis not present

## 2017-01-12 DIAGNOSIS — M545 Low back pain: Secondary | ICD-10-CM | POA: Diagnosis not present

## 2017-01-12 DIAGNOSIS — M1612 Unilateral primary osteoarthritis, left hip: Secondary | ICD-10-CM | POA: Diagnosis not present

## 2017-01-12 NOTE — Patient Outreach (Signed)
[facility]) Care Management  01/12/2017  [name] 12/18/1932 [id number]  Transition of care:  Hospital course 5/19/ 18 to 12/20/16 Skilled nursing facility 12/20/16 to 12/30/16 Status post left wrist fracture, ORIF of left wrist  Transition of care follow up call to patient. HIPAA verified with patient. Patient states she spoke with Di Kindle, Education officer, museum with [facility].  States Di Kindle delivered to her some food items. Verbalized appreciation for this. Patient states she is still not doing well with her meal preparation. Patient states she has tried to make sandwiches and do some light meal preparation but it is to difficult for her. Patient states she is right hand dominant. Patient states she has had 4 surgeries on her right shoulder in the past which has made her right arm weak and she has the left wrist which is broken.  Patient states she has been able to eat cereal. Patient states [facility] is scheduled to do her screening on tomorrow 01/13/17.   Patient reports her daughter came to stay with her a few days.  Patient states she and her husband have been moved to the main/ first floor of the house which has made transporting easier. Patient states she does not have to use the steps now. Patient states her daughter cooked while she was with her.   Patient states she does not have a lot of stamina but she doing a little better. Patient states her home physical therapy is going ok.   Patient states she is unable to keep her orthopedic appointment with Dr. Caralyn Guile on Monday 01/17/16 because she does not have transportation. Patient states she rescheduled her appointment for Tuesday 01/24/17. States her daughter will take her to that appointment. Patient states she had contacted the [facility] center for transportation assistance with her Monday appointment but know one took the ride. Patient states she would like to switch her orthopedic doctor to [facility] [place] or allow her primary  MD to follow her up if possible because it is to difficult for her to get to [facility] for appointments. RNCM offered to assist patient with coordinating care with her orthopedic doctor. Patient verbalized agreement.  Patient reports she has a doctors appointment with her primary on tomorrow 01/13/17.Patient  States her primary MD is suppose to handle her pain management now. Patient states will discuss this further with her doctor.  RNCM advised patient to contact Di Kindle, Education officer, museum with [facility] to discuss transportation resources. RNCM provided contact phone number.    PLAN; RNCM will contact Dr. Angus Palms office to discuss patients transportation concern and possible transferring care to a doctor in [place]. Patient will report to [facility] if [facility] screening was completed.   Quinn Plowman RN,BSN,CCM [facility] Telephonic  [phone]

## 2017-01-13 ENCOUNTER — Encounter: Payer: Self-pay | Admitting: Family Medicine

## 2017-01-13 ENCOUNTER — Ambulatory Visit (INDEPENDENT_AMBULATORY_CARE_PROVIDER_SITE_OTHER): Payer: Medicare Other | Admitting: Family Medicine

## 2017-01-13 VITALS — BP 139/98 | HR 109 | Wt 154.0 lb

## 2017-01-13 DIAGNOSIS — S62102A Fracture of unspecified carpal bone, left wrist, initial encounter for closed fracture: Secondary | ICD-10-CM | POA: Diagnosis not present

## 2017-01-13 DIAGNOSIS — M153 Secondary multiple arthritis: Secondary | ICD-10-CM

## 2017-01-13 MED ORDER — OXYCODONE HCL 10 MG PO TABS: 10.0000 mg | ORAL_TABLET | Freq: Four times a day (QID) | ORAL | 0 refills | Status: DC | PRN

## 2017-01-13 MED ORDER — FENTANYL 12 MCG/HR TD PT72: 12.5000 ug | MEDICATED_PATCH | TRANSDERMAL | 0 refills | Status: DC

## 2017-01-13 MED ORDER — MONTELUKAST SODIUM 10 MG PO TABS: 10.0000 mg | ORAL_TABLET | Freq: Every day | ORAL | 3 refills | Status: DC

## 2017-01-13 MED ORDER — ONDANSETRON HCL 4 MG PO TABS: 4.0000 mg | ORAL_TABLET | Freq: Three times a day (TID) | ORAL | 12 refills | Status: DC | PRN

## 2017-01-13 NOTE — Progress Notes (Signed)
[name] is a 83 y.o. female who presents to [place]: [place] today for follow-up left arm, and chronic pain..  Patient suffered a left arm fracture at the distal radius requiring ORIF. This is managed by Dr. Caralyn Guile at [facility]. She has great difficulty arranging for transportation to [place] because neither her nor her husband drive very much. She wonders if she can transition some of her care to this office. She notes that her left arm pain is significantly improved and she is tolerating the cast quite well.  Chronic pain: Patient has chronic back and leg pain. Again she notes that transportation is a significant problem. She wishes to transfer her chronic pain needs to this clinic. She brings her foot and only an oxycodone to the clinic today for pill count.   Past Medical History:  Diagnosis Date  . Anxiety   . Arthritis    osteoarthritis  . Asthma   . C. difficile diarrhea   . Candida esophagitis ([place])   . Chronic sinusitis   . Depression   . Depression   . Diverticulosis   . DJD (degenerative joint disease)     L5 compression fracture  . Edema extremities   . Fibromyalgia   . GERD (gastroesophageal reflux disease)   . Gout   . Hyperlipidemia   . Hypertension   . Osteoarthritis   . Osteopenia   . Panic disorder   . Renal failure, unspecified   . Spinal stenosis of lumbar region   . Tubular adenoma of colon   . Wrist fracture, left    Past Surgical History:  Procedure Laterality Date  . ADENOIDECTOMY    . CARPAL TUNNEL RELEASE  2007, 2009   Bilateral  . CATARACT EXTRACTION, BILATERAL    . CHOANAL ADENIODECTOMY    . DIAGNOSTIC LARYNGOSCOPY N/A 11/05/2015   Procedure: DIAGNOSTIC LARYNGOSCOPY AND ESOPHAGOSCOPY;  Surgeon: Rozetta Nunnery, MD;  Location: [facility];  Service: ENT;  Laterality: N/A;  . ganglion cyct removal on  fingers     right  . ganglion cyst  wrist    . KNEE ARTHROSCOPY     right  . NASAL SINUS SURGERY    . OPEN REDUCTION INTERNAL FIXATION (ORIF) DISTAL RADIAL FRACTURE Left 12/18/2016   Procedure: OPEN REDUCTION INTERNAL FIXATION (ORIF) DISTAL RADIAL FRACTURE;  Surgeon: Iran Planas, MD;  Location: [facility];  Service: Orthopedics;  Laterality: Left;  . SHOULDER SURGERY  2004, 07/30/10, 01/2011   after her right humeral neck fracture, revision hemiarthroplasty 2004 for persistent pain, revision reverse arthroplasty December 2011 for persistent pain, incision and drainage with poly-exchange 01/26/2011 for possible prosthetic joint infection.  . TONSILLECTOMY AND ADENOIDECTOMY    . TOTAL ABDOMINAL HYSTERECTOMY    . TOTAL HIP ARTHROPLASTY  2004   right  . TOTAL KNEE ARTHROPLASTY  2006   right  . TOTAL SHOULDER REPLACEMENT  2002   right   Social History  Substance Use Topics  . Smoking status: Never Smoker  . Smokeless tobacco: Never Used  . Alcohol use No   family history includes Bladder Cancer in her father; Depression in her son; Diabetes in her mother; Hypertension in her mother.  ROS as above:  Medications: Current Outpatient Prescriptions  Medication Sig Dispense Refill  . ALPRAZolam (XANAX) 0.5 MG tablet Take 1 tablet (0.5 mg total) by mouth 2 (two) times daily as needed for anxiety. 15 tablet 0  . AMBULATORY NON FORMULARY  MEDICATION Rolling Balcom.  Dx: Rheumatoid Arthritis.  Use daily as needed for ambulation. 1 Units 0  . amLODipine (NORVASC) 10 MG tablet take 1 tablet by mouth once daily 90 tablet 2  . Calcium Carbonate-Vitamin D (CALCIUM 600+D) 600-400 MG-UNIT per tablet Take 1 tablet by mouth 2 (two) times daily.    [name] Desvenlafaxine Succinate ER 25 MG TB24 Take 25 mg by mouth daily.  0  . diclofenac sodium (VOLTAREN) 1 % GEL Apply 1 application topically 4 (four) times daily.  0  . diphenhydrAMINE (BENADRYL) 25 MG tablet Take 25 mg by mouth every 6 (six) hours as needed. For  allergies    . EPINEPHrine (ADRENACLICK) 0.3 FK/8.1 mL IJ SOAJ injection Inject 0.3 mLs (0.3 mg total) into the muscle once. 1 Device 1  . fentaNYL (DURAGESIC - DOSED MCG/HR) 12 MCG/HR Place 1 patch (12.5 mcg total) onto the skin every 3 (three) days. 5 patch 0  . hydrochlorothiazide (HYDRODIURIL) 25 MG tablet take 1 tablet by mouth every morning for blood pressure 90 tablet 1  . ipratropium (ATROVENT) 0.06 % nasal spray Place 2 sprays into both nostrils 3 (three) times daily as needed for rhinitis. 15 mL 12  . montelukast (SINGULAIR) 10 MG tablet take 1 tablet by mouth at bedtime 30 tablet 3  . omeprazole (PRILOSEC) 20 MG capsule Take 20 mg by mouth daily.  0  . Oxycodone HCl 10 MG TABS Take 1 tablet (10 mg total) by mouth 4 (four) times daily as needed (for pain). 20 tablet 0  . polyethylene glycol (MIRALAX / GLYCOLAX) packet Take 17 g by mouth daily. 14 each 0  . potassium chloride SA (K-DUR,KLOR-CON) 20 MEQ tablet take 1 tablet by mouth once daily 30 tablet 1  . simvastatin (ZOCOR) 10 MG tablet TAKE 1 TABLET BY MOUTH ONCE DAILY 90 tablet 0  . zoledronic acid (RECLAST) 5 MG/100ML SOLN Inject 5 mg into the vein. Once yearly. In June     No current facility-administered medications for this visit.    Allergies  Allergen Reactions  . Bee Venom Anaphylaxis    Has epi-pen  . Ceftin [Cefuroxime Axetil] Anaphylaxis  . Ciprofloxacin Anaphylaxis and Palpitations  . Ephedrine Other (See Comments) and Palpitations    "knocks me out"  . Sulfonamide Derivatives Anaphylaxis  . Telithromycin Anaphylaxis    Reaction: also blurred vision   . Valsartan Anaphylaxis  . Verapamil Anaphylaxis  . Venlafaxine Other (See Comments)    Insomnia, panic  . Beta Adrenergic Blockers     bradycardia  . Cephalosporins     Allergic Reaction  . Clindamycin/Lincomycin     Dry skin and itching/ Cdiff  . Cymbalta [Duloxetine Hcl] Other (See Comments)    Reaction:Confusion and " I fall down"  . Doxazosin      "blurred vision and irritable"  . Gabapentin Other (See Comments)    Reaction: headache  . Hydralazine     HA, Diarrhea  . Lamotrigine     Patient unsure at this time  . Mirtazapine Other (See Comments)    unknown  . Morphine Other (See Comments)    Medication has no effect with pain  . Oxycodone-Acetaminophen Hives    Takes plain oxy IR at home (May 2018)  . Pregabalin Swelling  . Prochlorperazine Edisylate   . Pseudoephedrine Other (See Comments)    Reaction: hyperventilates and blacks out  . Relafen [Nabumetone] Swelling  . Zoloft [Sertraline] Other (See Comments)    "nervous/jittery"  . Doxycycline Hives  Health Maintenance Health Maintenance  Topic Date Due  . PNA vac Low Risk Adult (2 of 2 - PPSV23) 12/29/2016  . INFLUENZA VACCINE  03/01/2017  . TETANUS/TDAP  02/20/2020  . DEXA SCAN  Completed     Exam:  BP (!) 139/98   Pulse (!) 109   Wt 154 lb (69.9 kg)   BMI 29.10 kg/m  Gen: Well NAD HEENT: EOMI,  MMM Lungs: Normal work of breathing. CTABL Heart: RRR no MRG heart rate 86 beats per) check today Abd: NABS, Soft. Nondistended, Nontender Exts: Brisk capillary refill, warm and well perfused.  Left arm well-appearing in a cast. No numbness or weakness distal hand. Back significant thoracic kyphosis. Patient ambulates using a Oo.    No results found for this or any previous visit (from the past 72 hour(s)). No results found.    Assessment and Plan: 83 y.o. female with  Left arm fracture doing quite well. Patient has a follow-up appointment with her hand surgeon on June 26. I think is reasonable for her to attend this appointment. I'm happy to take over care for convenience sake. We'll send a letter to Dr. Caralyn Guile to keep him in the loop.   Chronic Pain: We will take over care for pain medicines.  New rx written to be filled on or after June 26th. I will also send a copy of this note to Pain Management.  Pt researched in [place] in 1  month  No orders of the defined types were placed in this encounter.  No orders of the defined types were placed in this encounter.    Discussed warning signs or symptoms. Please see discharge instructions. Patient expresses understanding.  CC: Dr Caralyn Guile and Dr Greta Doom  I spent 40 minutes with this patient, greater than 50% was face-to-face time counseling regarding the above diagnosis.

## 2017-01-13 NOTE — Patient Instructions (Addendum)
Thank you for coming in today. Recheck in about 1 month.  Return sooner if needed.  Follow up with Dr Caralyn Guile. We will take over pain management.   You should hear about the psychiatrist as well.

## 2017-01-16 NOTE — Progress Notes (Signed)
Note sent to requested recipient via epic.  

## 2017-01-17 ENCOUNTER — Telehealth: Payer: Self-pay | Admitting: Family Medicine

## 2017-01-17 ENCOUNTER — Ambulatory Visit: Payer: Self-pay | Admitting: Family Medicine

## 2017-01-17 ENCOUNTER — Other Ambulatory Visit: Payer: Self-pay

## 2017-01-17 DIAGNOSIS — I129 Hypertensive chronic kidney disease with stage 1 through stage 4 chronic kidney disease, or unspecified chronic kidney disease: Secondary | ICD-10-CM | POA: Diagnosis not present

## 2017-01-17 DIAGNOSIS — N183 Chronic kidney disease, stage 3 (moderate): Secondary | ICD-10-CM | POA: Diagnosis not present

## 2017-01-17 DIAGNOSIS — M791 Myalgia: Secondary | ICD-10-CM | POA: Diagnosis not present

## 2017-01-17 DIAGNOSIS — M1612 Unilateral primary osteoarthritis, left hip: Secondary | ICD-10-CM | POA: Diagnosis not present

## 2017-01-17 DIAGNOSIS — M545 Low back pain: Secondary | ICD-10-CM | POA: Diagnosis not present

## 2017-01-17 DIAGNOSIS — G8929 Other chronic pain: Secondary | ICD-10-CM | POA: Diagnosis not present

## 2017-01-17 NOTE — Patient Outreach (Signed)
[facility]) Care Management  01/17/2017  [name] 1932/12/25 [id number]  Transition of care:  Hospital course 5/19/ 18 to 12/20/16 Skilled nursing facility 12/20/16 to 12/30/16 Status post left wrist fracture, ORIF of left wrist  Telephone call to patient for follow up. HIPAA verified with patient. Patient states mobile meals met with her to do assessment. Patient states her meals will be starting on January 23, 2017.  Patient states she received a call today from [facility] care management and was given additional numbers to call for transportation assistance. Patient states she will call contact numbers within the next day or so. Patient states she continues with her home physical therapy, occupational therapy and home health aid. Patient reports her occupational therapist said she was doing very well. Patient states she was able to go in the kitchen and feed her dogs and tidy up some. Patient verbalized excitement about being able to complete these task. Patient reports a nurse from [facility] has started coming 1 time per week to fill her pill box.  Patient again states she would like to switch to an orthopedic in [facility] or see her primary MD for follow up of her wrist cast. Patient states it is to difficult to get to Dr. Angus Palms office in [place]. RNCM informed patient that she left a message with Dr. Angus Palms office detailing patients concern and requesting a call back.  No response at this time from Dr. Angus Palms office.   PLAN;  RNCM will attempt 2nd telephone call to Dr. Angus Palms office to discuss care coordination request.  RNCM will follow up with patient within 1 week.   Quinn Plowman RN,BSN,CCM [facility] Telephonic  [phone]

## 2017-01-17 NOTE — Telephone Encounter (Signed)
Received call from [place], [place] with [facility] for the following verbal orders:  Continue nursing 1x week for 9 weeks to assist with ADL's. Also, order for nursing to fill pill boxes weekly with medications.  Verbal order given.

## 2017-01-18 DIAGNOSIS — N183 Chronic kidney disease, stage 3 (moderate): Secondary | ICD-10-CM | POA: Diagnosis not present

## 2017-01-18 DIAGNOSIS — M791 Myalgia: Secondary | ICD-10-CM | POA: Diagnosis not present

## 2017-01-18 DIAGNOSIS — I129 Hypertensive chronic kidney disease with stage 1 through stage 4 chronic kidney disease, or unspecified chronic kidney disease: Secondary | ICD-10-CM | POA: Diagnosis not present

## 2017-01-18 DIAGNOSIS — G8929 Other chronic pain: Secondary | ICD-10-CM | POA: Diagnosis not present

## 2017-01-18 DIAGNOSIS — M1612 Unilateral primary osteoarthritis, left hip: Secondary | ICD-10-CM | POA: Diagnosis not present

## 2017-01-18 DIAGNOSIS — M545 Low back pain: Secondary | ICD-10-CM | POA: Diagnosis not present

## 2017-01-19 DIAGNOSIS — G8929 Other chronic pain: Secondary | ICD-10-CM | POA: Diagnosis not present

## 2017-01-19 DIAGNOSIS — N183 Chronic kidney disease, stage 3 (moderate): Secondary | ICD-10-CM | POA: Diagnosis not present

## 2017-01-19 DIAGNOSIS — M791 Myalgia: Secondary | ICD-10-CM | POA: Diagnosis not present

## 2017-01-19 DIAGNOSIS — M545 Low back pain: Secondary | ICD-10-CM | POA: Diagnosis not present

## 2017-01-19 DIAGNOSIS — M1612 Unilateral primary osteoarthritis, left hip: Secondary | ICD-10-CM | POA: Diagnosis not present

## 2017-01-19 DIAGNOSIS — I129 Hypertensive chronic kidney disease with stage 1 through stage 4 chronic kidney disease, or unspecified chronic kidney disease: Secondary | ICD-10-CM | POA: Diagnosis not present

## 2017-01-20 DIAGNOSIS — M545 Low back pain: Secondary | ICD-10-CM | POA: Diagnosis not present

## 2017-01-20 DIAGNOSIS — M1612 Unilateral primary osteoarthritis, left hip: Secondary | ICD-10-CM | POA: Diagnosis not present

## 2017-01-20 DIAGNOSIS — M791 Myalgia: Secondary | ICD-10-CM | POA: Diagnosis not present

## 2017-01-20 DIAGNOSIS — G8929 Other chronic pain: Secondary | ICD-10-CM | POA: Diagnosis not present

## 2017-01-20 DIAGNOSIS — I129 Hypertensive chronic kidney disease with stage 1 through stage 4 chronic kidney disease, or unspecified chronic kidney disease: Secondary | ICD-10-CM | POA: Diagnosis not present

## 2017-01-20 DIAGNOSIS — N183 Chronic kidney disease, stage 3 (moderate): Secondary | ICD-10-CM | POA: Diagnosis not present

## 2017-01-23 ENCOUNTER — Telehealth: Payer: Self-pay | Admitting: Family Medicine

## 2017-01-23 NOTE — Telephone Encounter (Signed)
Received PA on Ondansetron 4mg  sent through cover my meds waiting on authorization. - CF

## 2017-01-24 DIAGNOSIS — S52572D Other intraarticular fracture of lower end of left radius, subsequent encounter for closed fracture with routine healing: Secondary | ICD-10-CM | POA: Diagnosis not present

## 2017-01-24 DIAGNOSIS — I129 Hypertensive chronic kidney disease with stage 1 through stage 4 chronic kidney disease, or unspecified chronic kidney disease: Secondary | ICD-10-CM | POA: Diagnosis not present

## 2017-01-24 DIAGNOSIS — N183 Chronic kidney disease, stage 3 (moderate): Secondary | ICD-10-CM | POA: Diagnosis not present

## 2017-01-24 DIAGNOSIS — M791 Myalgia: Secondary | ICD-10-CM | POA: Diagnosis not present

## 2017-01-24 DIAGNOSIS — M1612 Unilateral primary osteoarthritis, left hip: Secondary | ICD-10-CM | POA: Diagnosis not present

## 2017-01-24 DIAGNOSIS — G8929 Other chronic pain: Secondary | ICD-10-CM | POA: Diagnosis not present

## 2017-01-24 DIAGNOSIS — M545 Low back pain: Secondary | ICD-10-CM | POA: Diagnosis not present

## 2017-01-25 ENCOUNTER — Emergency Department (INDEPENDENT_AMBULATORY_CARE_PROVIDER_SITE_OTHER)
Admission: EM | Admit: 2017-01-25 | Discharge: 2017-01-25 | Disposition: A | Discharge status: Home / Self Care | Payer: Medicare Other | Source: Home / Self Care | Attending: Family Medicine | Admitting: Family Medicine

## 2017-01-25 ENCOUNTER — Encounter: Payer: Self-pay | Admitting: *Deleted

## 2017-01-25 DIAGNOSIS — F410 Panic disorder [episodic paroxysmal anxiety] without agoraphobia: Secondary | ICD-10-CM

## 2017-01-25 DIAGNOSIS — F430 Acute stress reaction: Secondary | ICD-10-CM

## 2017-01-25 DIAGNOSIS — F418 Other specified anxiety disorders: Secondary | ICD-10-CM

## 2017-01-25 NOTE — ED Notes (Signed)
Pt took a Xanax from her home meds. Charna Archer, LPN

## 2017-01-25 NOTE — Telephone Encounter (Signed)
error 

## 2017-01-25 NOTE — Telephone Encounter (Signed)
Received fax for approval on Ondansetron 4 MG.[name] [name]

## 2017-01-25 NOTE — ED Provider Notes (Signed)
CSN: [id number]     Arrival date & time 01/25/17  1624 History   First MD Initiated Contact with Patient 01/25/17 1640     Chief Complaint  Patient presents with  . Panic Attack   (Consider location/radiation/quality/duration/timing/severity/associated sxs/prior Treatment) HPI  [name] is a 83 y.o. female presenting to UC with daughter with c/o feeling anxious and feels like she is "suffocating." Symptoms started this afternoon after a meeting with hospice and her family to discuss her husband's care.  Pt felt like she was left out of the conversations and alleges other family members were yelling at her.  She denies CP but states she feels like its hard to breath and she may pass out. She does have 0.5mg  Ativan but she only took 1 this morning, she has not taken another on sense these symptoms started.  She is requesting to speak with her PCP, Dr. Georgina Snell, if possible while in UC.   Past Medical History:  Diagnosis Date  . Anxiety   . Arthritis    osteoarthritis  . Asthma   . C. difficile diarrhea   . Candida esophagitis ([place])   . Chronic sinusitis   . Depression   . Depression   . Diverticulosis   . DJD (degenerative joint disease)     L5 compression fracture  . Edema extremities   . Fibromyalgia   . GERD (gastroesophageal reflux disease)   . Gout   . Hyperlipidemia   . Hypertension   . Osteoarthritis   . Osteopenia   . Panic disorder   . Renal failure, unspecified   . Spinal stenosis of lumbar region   . Tubular adenoma of colon   . Wrist fracture, left    Past Surgical History:  Procedure Laterality Date  . ADENOIDECTOMY    . CARPAL TUNNEL RELEASE  2007, 2009   Bilateral  . CATARACT EXTRACTION, BILATERAL    . CHOANAL ADENIODECTOMY    . DIAGNOSTIC LARYNGOSCOPY N/A 11/05/2015   Procedure: DIAGNOSTIC LARYNGOSCOPY AND ESOPHAGOSCOPY;  Surgeon: Rozetta Nunnery, MD;  Location: [facility];  Service: ENT;  Laterality: N/A;  . ganglion cyct removal on fingers      right  . ganglion cyst  wrist    . KNEE ARTHROSCOPY     right  . NASAL SINUS SURGERY    . OPEN REDUCTION INTERNAL FIXATION (ORIF) DISTAL RADIAL FRACTURE Left 12/18/2016   Procedure: OPEN REDUCTION INTERNAL FIXATION (ORIF) DISTAL RADIAL FRACTURE;  Surgeon: Iran Planas, MD;  Location: [facility];  Service: Orthopedics;  Laterality: Left;  . SHOULDER SURGERY  2004, 07/30/10, 01/2011   after her right humeral neck fracture, revision hemiarthroplasty 2004 for persistent pain, revision reverse arthroplasty December 2011 for persistent pain, incision and drainage with poly-exchange 01/26/2011 for possible prosthetic joint infection.  . TONSILLECTOMY AND ADENOIDECTOMY    . TOTAL ABDOMINAL HYSTERECTOMY    . TOTAL HIP ARTHROPLASTY  2004   right  . TOTAL KNEE ARTHROPLASTY  2006   right  . TOTAL SHOULDER REPLACEMENT  2002   right   Family History  Problem Relation Age of Onset  . Bladder Cancer Father   . Diabetes Mother   . Hypertension Mother   . Breast cancer Unknown   . Colon polyps Unknown   . Depression Son    Social History  Substance Use Topics  . Smoking status: Never Smoker  . Smokeless tobacco: Never Used  . Alcohol use No   OB History    No data available  Review of Systems  Respiratory: Positive for chest tightness and shortness of breath.   Cardiovascular: Negative for chest pain and palpitations.  Neurological: Positive for weakness (generalized) and light-headedness. Negative for dizziness, syncope and headaches.  Psychiatric/Behavioral: The patient is nervous/anxious.     Allergies  Bee venom; Ceftin [cefuroxime axetil]; Ciprofloxacin; Ephedrine; Sulfonamide derivatives; Telithromycin; Valsartan; Verapamil; Venlafaxine; Beta adrenergic blockers; Cephalosporins; Clindamycin/lincomycin; Cymbalta [duloxetine hcl]; Doxazosin; Gabapentin; Hydralazine; Lamotrigine; Mirtazapine; Morphine; Oxycodone-acetaminophen; Pregabalin; Prochlorperazine edisylate; Pseudoephedrine;  Relafen [nabumetone]; Zoloft [sertraline]; and Doxycycline  Home Medications   Prior to Admission medications   Medication Sig Start Date End Date Taking? Authorizing Provider  ALPRAZolam Duanne Moron) 0.5 MG tablet Take 1 tablet (0.5 mg total) by mouth 2 (two) times daily as needed for anxiety. 12/20/16   Bonnielee Haff, MD  AMBULATORY NON FORMULARY MEDICATION Rolling [place].  Dx: Rheumatoid Arthritis.  Use daily as needed for ambulation. 06/17/15   Marcial Pacas, DO  amLODipine (NORVASC) 10 MG tablet take 1 tablet by mouth once daily 11/22/16   Gregor Hams, MD  Calcium Carbonate-Vitamin D (CALCIUM 600+D) 600-400 MG-UNIT per tablet Take 1 tablet by mouth 2 (two) times daily.    [provider]  Desvenlafaxine Succinate ER 25 MG TB24 Take 25 mg by mouth daily. 11/30/16   [provider]  diclofenac sodium (VOLTAREN) 1 % GEL Apply 1 application topically 4 (four) times daily. 11/06/15   [provider]  diphenhydrAMINE (BENADRYL) 25 MG tablet Take 25 mg by mouth every 6 (six) hours as needed. For allergies    [provider]  EPINEPHrine (ADRENACLICK) 0.3 PO/2.4 mL IJ SOAJ injection Inject 0.3 mLs (0.3 mg total) into the muscle once. 12/30/15   Hommel, Hilliard Clark, DO  fentaNYL (DURAGESIC - DOSED MCG/HR) 12 MCG/HR Place 1 patch (12.5 mcg total) onto the skin every 3 (three) days. 01/13/17   Gregor Hams, MD  hydrochlorothiazide (HYDRODIURIL) 25 MG tablet take 1 tablet by mouth every morning for blood pressure 10/31/16   Gregor Hams, MD  ipratropium (ATROVENT) 0.06 % nasal spray Place 2 sprays into both nostrils 3 (three) times daily as needed for rhinitis. 11/25/14   Hommel, Sean, DO  montelukast (SINGULAIR) 10 MG tablet Take 1 tablet (10 mg total) by mouth at bedtime. 01/13/17   Gregor Hams, MD  omeprazole (PRILOSEC) 20 MG capsule Take 20 mg by mouth daily. 12/09/16   [provider]  ondansetron (ZOFRAN) 4 MG tablet Take 1 tablet (4 mg total) by mouth every 8 (eight)  hours as needed for nausea or vomiting. 01/13/17   Gregor Hams, MD  Oxycodone HCl 10 MG TABS Take 1 tablet (10 mg total) by mouth 4 (four) times daily as needed (for pain). 01/13/17   Gregor Hams, MD  polyethylene glycol [facility] Floria Raveling) packet Take 17 g by mouth daily. 12/20/16   Bonnielee Haff, MD  potassium chloride SA (K-DUR,KLOR-CON) 20 MEQ tablet take 1 tablet by mouth once daily 11/22/16   Sueanne Margarita, MD  simvastatin (ZOCOR) 10 MG tablet TAKE 1 TABLET BY MOUTH ONCE DAILY 01/10/17   Gregor Hams, MD  zoledronic acid (RECLAST) 5 MG/100ML SOLN Inject 5 mg into the vein. Once yearly. In June    [provider]   Meds Ordered and Administered this Visit  Medications - No data to display  BP 136/72 (BP Location: Left Arm)   Pulse (!) 102   Temp 97.4 F (36.3 C) (Oral)   Resp 16   SpO2  96%  No data found.   Physical Exam  Constitutional: She is oriented to person, place, and time. She appears well-developed and well-nourished.  Elderly female sitting in wheelchair, tearful but alert an cooperative during exam  HENT:  Head: Normocephalic and atraumatic.  Eyes: EOM are normal.  Neck: Normal range of motion.  Cardiovascular: Regular rhythm.  Tachycardia present.   Pulmonary/Chest: Effort normal and breath sounds normal. No respiratory distress. She has no wheezes. She has no rales.  Musculoskeletal: Normal range of motion.  Neurological: She is alert and oriented to person, place, and time.  Skin: Skin is warm and dry. She is not diaphoretic.  Psychiatric: Her behavior is normal. Her mood appears anxious. She exhibits a depressed mood ( tearful).  Nursing note and vitals reviewed.   Urgent Care Course     Procedures (including critical care time)  Labs Review Labs Reviewed - No data to display  Imaging Review No results found.    MDM   1. Panic attack as reaction to stress   2. Depression with anxiety     Pt presenting to UC with a panic  attack. Consulted with Dr. Georgina Snell, who came to speak with pt upon her request.  Pt given her prescribed 0.5mg  Ativan in UC, allowed to rest lying down in darkened room.  Vitals: unremarkable besides HR, which is improving. Denies SI or HI. Daughter accompaning pt and can drive her home.   Number for [facility] and Mobile Crisis team provided if needed.  F/u with PCP within the next few days.     Noe Gens, [place] 01/25/17 [phone]

## 2017-01-25 NOTE — ED Triage Notes (Signed)
Pt c/o feeling anxious and feeling like she is "suffocating" today after having a meeting with hospice to discuss her husbands care.

## 2017-01-26 ENCOUNTER — Encounter: Payer: Self-pay | Admitting: Family Medicine

## 2017-01-26 ENCOUNTER — Ambulatory Visit (INDEPENDENT_AMBULATORY_CARE_PROVIDER_SITE_OTHER): Payer: Medicare Other | Admitting: Family Medicine

## 2017-01-26 VITALS — BP 138/73 | HR 84 | Wt 157.0 lb

## 2017-01-26 DIAGNOSIS — F419 Anxiety disorder, unspecified: Secondary | ICD-10-CM | POA: Diagnosis not present

## 2017-01-26 DIAGNOSIS — F329 Major depressive disorder, single episode, unspecified: Secondary | ICD-10-CM

## 2017-01-26 DIAGNOSIS — F331 Major depressive disorder, recurrent, moderate: Secondary | ICD-10-CM | POA: Diagnosis not present

## 2017-01-26 MED ORDER — MIRTAZAPINE 7.5 MG PO TABS: 7.5000 mg | ORAL_TABLET | Freq: Every day | ORAL | 1 refills | Status: DC

## 2017-01-26 NOTE — Patient Instructions (Addendum)
Thank you for coming in today. Start Mirtizapine at bedtime.  This will make you sleepy.  Continue xanax as needed for anxiety.  Recheck on July 12 as arranged.  You should hear from the psychiatry office here soonish.   Mirtazapine tablets What is this medicine? MIRTAZAPINE (mir TAZ a peen) is used to treat depression. This medicine may be used for other purposes; ask your health care provider or pharmacist if you have questions. COMMON BRAND NAME(S): Remeron What should I tell my health care provider before I take this medicine? They need to know if you have any of these conditions: -bipolar disorder -glaucoma -kidney disease -liver disease -suicidal thoughts -an unusual or allergic reaction to mirtazapine, other medicines, foods, dyes, or preservatives -pregnant or trying to get pregnant -breast-feeding How should I use this medicine? Take this medicine by mouth with a glass of water. Follow the directions on the prescription label. Take your medicine at regular intervals. Do not take your medicine more often than directed. Do not stop taking this medicine suddenly except upon the advice of your doctor. Stopping this medicine too quickly may cause serious side effects or your condition may worsen. A special MedGuide will be given to you by the pharmacist with each prescription and refill. Be sure to read this information carefully each time. Talk to your pediatrician regarding the use of this medicine in children. Special care may be needed. Overdosage: If you think you have taken too much of this medicine contact a poison control center or emergency room at once. NOTE: This medicine is only for you. Do not share this medicine with others. What if I miss a dose? If you miss a dose, take it as soon as you can. If it is almost time for your next dose, take only that dose. Do not take double or extra doses. What may interact with this medicine? Do not take this medicine with any of the  following medications: -linezolid -MAOIs like Carbex, Eldepryl, Marplan, Nardil, and Parnate -methylene blue (injected into a vein) This medicine may also interact with the following medications: -alcohol -antiviral medicines for HIV or AIDS -certain medicines that treat or prevent blood clots like warfarin -certain medicines for depression, anxiety, or psychotic disturbances -certain medicines for fungal infections like ketoconazole and itraconazole -certain medicines for migraine headache like almotriptan, eletriptan, frovatriptan, naratriptan, rizatriptan, sumatriptan, zolmitriptan -certain medicines for seizures like carbamazepine or phenytoin -certain medicines for sleep -cimetidine -erythromycin -fentanyl -lithium -medicines for blood pressure -nefazodone -rasagiline -rifampin -supplements like St. John's wort, kava kava, valerian -tramadol -tryptophan This list may not describe all possible interactions. Give your health care provider a list of all the medicines, herbs, non-prescription drugs, or dietary supplements you use. Also tell them if you smoke, drink alcohol, or use illegal drugs. Some items may interact with your medicine. What should I watch for while using this medicine? Tell your doctor if your symptoms do not get better or if they get worse. Visit your doctor or health care professional for regular checks on your progress. Because it may take several weeks to see the full effects of this medicine, it is important to continue your treatment as prescribed by your doctor. Patients and their families should watch out for new or worsening thoughts of suicide or depression. Also watch out for sudden changes in feelings such as feeling anxious, agitated, panicky, irritable, hostile, aggressive, impulsive, severely restless, overly excited and hyperactive, or not being able to sleep. If this happens, especially at the  beginning of treatment or after a change in dose, call your  health care professional. [name] may get drowsy or dizzy. Do not drive, use machinery, or do anything that needs mental alertness until you know how this medicine affects you. Do not stand or sit up quickly, especially if you are an older patient. This reduces the risk of dizzy or fainting spells. Alcohol may interfere with the effect of this medicine. Avoid alcoholic drinks. This medicine may cause dry eyes and blurred vision. If you wear contact lenses you may feel some discomfort. Lubricating drops may help. See your eye doctor if the problem does not go away or is severe. Your mouth may get dry. Chewing sugarless gum or sucking hard candy, and drinking plenty of water may help. Contact your doctor if the problem does not go away or is severe. What side effects may I notice from receiving this medicine? Side effects that you should report to your doctor or health care professional as soon as possible: -allergic reactions like skin rash, itching or hives, swelling of the face, lips, or tongue -anxious -changes in vision -chest pain -confusion -elevated mood, decreased need for sleep, racing thoughts, impulsive behavior -eye pain -fast, irregular heartbeat -feeling faint or lightheaded, falls -feeling agitated, angry, or irritable -fever or chills, sore throat -hallucination, loss of contact with reality -loss of balance or coordination -mouth sores -redness, blistering, peeling or loosening of the skin, including inside the mouth -restlessness, pacing, inability to keep still -seizures -stiff muscles -suicidal thoughts or other mood changes -trouble passing urine or change in the amount of urine -trouble sleeping -unusual bleeding or bruising -unusually weak or tired -vomiting Side effects that usually do not require medical attention (report to your doctor or health care professional if they continue or are bothersome): -change in appetite -constipation -dizziness -dry  mouth -muscle aches or pains -nausea -tired -weight gain This list may not describe all possible side effects. Call your doctor for medical advice about side effects. You may report side effects to FDA at 1-800-FDA-1088. Where should I keep my medicine? Keep out of the reach of children. Store at room temperature between 15 and 30 degrees C (59 and 86 degrees F) Protect from light and moisture. Throw away any unused medicine after the expiration date. NOTE: This sheet is a summary. It may not cover all possible information. If you have questions about this medicine, talk to your doctor, pharmacist, or health care provider.  2018 Elsevier/Gold Standard (2015-12-17 17:30:45)

## 2017-01-26 NOTE — Progress Notes (Signed)
[name] is a 83 y.o. female who presents to [facility] today for follow-up anxiety. Patient was seen in urgent care yesterday for effectively a panic attack. She felt a bit better today but notes continued depression and anxiety symptoms. She has stopped her Pristiq as guided by her psychiatrist and is not on any medication for anxiety or depression aside from Xanax now. She notes profound feelings of guilt and is fundamentally unhappy with her life.   Past Medical History:  Diagnosis Date  . Anxiety   . Arthritis    osteoarthritis  . Asthma   . C. difficile diarrhea   . Candida esophagitis ([place])   . Chronic sinusitis   . Depression   . Depression   . Diverticulosis   . DJD (degenerative joint disease)     L5 compression fracture  . Edema extremities   . Fibromyalgia   . GERD (gastroesophageal reflux disease)   . Gout   . Hyperlipidemia   . Hypertension   . Osteoarthritis   . Osteopenia   . Panic disorder   . Renal failure, unspecified   . Spinal stenosis of lumbar region   . Tubular adenoma of colon   . Wrist fracture, left    Past Surgical History:  Procedure Laterality Date  . ADENOIDECTOMY    . CARPAL TUNNEL RELEASE  2007, 2009   Bilateral  . CATARACT EXTRACTION, BILATERAL    . CHOANAL ADENIODECTOMY    . DIAGNOSTIC LARYNGOSCOPY N/A 11/05/2015   Procedure: DIAGNOSTIC LARYNGOSCOPY AND ESOPHAGOSCOPY;  Surgeon: Rozetta Nunnery, MD;  Location: [facility];  Service: ENT;  Laterality: N/A;  . ganglion cyct removal on fingers     right  . ganglion cyst  wrist    . KNEE ARTHROSCOPY     right  . NASAL SINUS SURGERY    . OPEN REDUCTION INTERNAL FIXATION (ORIF) DISTAL RADIAL FRACTURE Left 12/18/2016   Procedure: OPEN REDUCTION INTERNAL FIXATION (ORIF) DISTAL RADIAL FRACTURE;  Surgeon: Iran Planas, MD;  Location: [facility];  Service: Orthopedics;   Laterality: Left;  . SHOULDER SURGERY  2004, 07/30/10, 01/2011   after her right humeral neck fracture, revision hemiarthroplasty 2004 for persistent pain, revision reverse arthroplasty December 2011 for persistent pain, incision and drainage with poly-exchange 01/26/2011 for possible prosthetic joint infection.  . TONSILLECTOMY AND ADENOIDECTOMY    . TOTAL ABDOMINAL HYSTERECTOMY    . TOTAL HIP ARTHROPLASTY  2004   right  . TOTAL KNEE ARTHROPLASTY  2006   right  . TOTAL SHOULDER REPLACEMENT  2002   right   Social History  Substance Use Topics  . Smoking status: Never Smoker  . Smokeless tobacco: Never Used  . Alcohol use No   family history includes Bladder Cancer in her father; Depression in her son; Diabetes in her mother; Hypertension in her mother.  ROS as above:  Medications: Current Outpatient Prescriptions  Medication Sig Dispense Refill  . ALPRAZolam (XANAX) 0.5 MG tablet Take 1 tablet (0.5 mg total) by mouth 2 (two) times daily as needed for anxiety. 15 tablet 0  . AMBULATORY NON FORMULARY MEDICATION Rolling Slee.  Dx: Rheumatoid Arthritis.  Use daily as needed for ambulation. 1 Units 0  . amLODipine (NORVASC) 10 MG tablet take 1 tablet by mouth once daily 90 tablet 2  . Calcium Carbonate-Vitamin D (CALCIUM 600+D) 600-400 MG-UNIT per tablet Take 1 tablet by mouth 2 (two) times daily.    [name]  diclofenac sodium (VOLTAREN) 1 % GEL Apply 1 application topically 4 (four) times daily.  0  . diphenhydrAMINE (BENADRYL) 25 MG tablet Take 25 mg by mouth every 6 (six) hours as needed. For allergies    . EPINEPHrine (ADRENACLICK) 0.3 ZJ/6.9 mL IJ SOAJ injection Inject 0.3 mLs (0.3 mg total) into the muscle once. 1 Device 1  . fentaNYL (DURAGESIC - DOSED MCG/HR) 12 MCG/HR Place 1 patch (12.5 mcg total) onto the skin every 3 (three) days. 10 patch 0  . hydrochlorothiazide (HYDRODIURIL) 25 MG tablet take 1 tablet by mouth every morning for blood pressure 90 tablet 1  . ipratropium  (ATROVENT) 0.06 % nasal spray Place 2 sprays into both nostrils 3 (three) times daily as needed for rhinitis. 15 mL 12  . montelukast (SINGULAIR) 10 MG tablet Take 1 tablet (10 mg total) by mouth at bedtime. 90 tablet 3  . omeprazole (PRILOSEC) 20 MG capsule Take 20 mg by mouth daily.  0  . ondansetron (ZOFRAN) 4 MG tablet Take 1 tablet (4 mg total) by mouth every 8 (eight) hours as needed for nausea or vomiting. 20 tablet 12  . Oxycodone HCl 10 MG TABS Take 1 tablet (10 mg total) by mouth 4 (four) times daily as needed (for pain). 120 tablet 0  . polyethylene glycol (MIRALAX / GLYCOLAX) packet Take 17 g by mouth daily. 14 each 0  . potassium chloride SA (K-DUR,KLOR-CON) 20 MEQ tablet take 1 tablet by mouth once daily 30 tablet 1  . simvastatin (ZOCOR) 10 MG tablet TAKE 1 TABLET BY MOUTH ONCE DAILY 90 tablet 0  . zoledronic acid (RECLAST) 5 MG/100ML SOLN Inject 5 mg into the vein. Once yearly. In June    . mirtazapine (REMERON) 7.5 MG tablet Take 1 tablet (7.5 mg total) by mouth at bedtime. 30 tablet 1   No current facility-administered medications for this visit.    Allergies  Allergen Reactions  . Bee Venom Anaphylaxis    Has epi-pen  . Ceftin [Cefuroxime Axetil] Anaphylaxis  . Ciprofloxacin Anaphylaxis and Palpitations  . Ephedrine Other (See Comments) and Palpitations    "knocks me out"  . Sulfonamide Derivatives Anaphylaxis  . Telithromycin Anaphylaxis    Reaction: also blurred vision   . Valsartan Anaphylaxis  . Verapamil Anaphylaxis  . Venlafaxine Other (See Comments)    Insomnia, panic  . Beta Adrenergic Blockers     bradycardia  . Cephalosporins     Allergic Reaction  . Clindamycin/Lincomycin     Dry skin and itching/ Cdiff  . Cymbalta [Duloxetine Hcl] Other (See Comments)    Reaction:Confusion and " I fall down"  . Doxazosin     "blurred vision and irritable"  . Gabapentin Other (See Comments)    Reaction: headache  . Hydralazine     HA, Diarrhea  . Lamotrigine      Patient unsure at this time  . Morphine Other (See Comments)    Medication has no effect with pain  . Oxycodone-Acetaminophen Hives    Takes plain oxy IR at home (May 2018)  . Pregabalin Swelling  . Prochlorperazine Edisylate   . Pseudoephedrine Other (See Comments)    Reaction: hyperventilates and blacks out  . Relafen [Nabumetone] Swelling  . Zoloft [Sertraline] Other (See Comments)    "nervous/jittery"  . Doxycycline Hives    Health Maintenance Health Maintenance  Topic Date Due  . PNA vac Low Risk Adult (2 of 2 - PPSV23) 12/29/2016  . INFLUENZA VACCINE  03/01/2017  . TETANUS/TDAP  02/20/2020  . DEXA SCAN  Completed     Exam:  BP 138/73   Pulse 84   Wt 157 lb (71.2 kg)   BMI 29.66 kg/m  Gen: Well NAD Psych: Tearful affect normal speech. Thought process is somewhat tangential. No SI or HI expressed.  Depression screen [facility] 2/9 01/26/2017 01/05/2017 09/07/2016 09/02/2016 08/11/2016  Decreased Interest 1 1 0 2 2  Down, Depressed, Hopeless 2 3 3 2 2   PHQ - 2 Score 3 4 3 4 4   Altered sleeping 3 1 1 3 3   Tired, decreased energy 3 3 3 3 3   Change in appetite 1 1 0 2 2  Feeling bad or failure about yourself  1 1 1 2 2   Trouble concentrating 2 1 0 2 2  Moving slowly or fidgety/restless 0 1 0 1 1  Suicidal thoughts 0 0 0 0 0  PHQ-9 Score 13 12 8 17 17   Difficult doing work/chores - Very difficult Somewhat difficult Very difficult Very difficult   GAD 7 : Generalized Anxiety Score 01/26/2017  Nervous, Anxious, on Edge 3  Control/stop worrying 3  Worry too much - different things 3  Trouble relaxing 3  Restless 0  Easily annoyed or irritable 1  Afraid - awful might happen 2  Total GAD 7 Score 15       No results found for this or any previous visit (from the past 72 hour(s)). No results found.    Assessment and Plan: 83 y.o. female with anxiety and depression.  Ms. Astle is not controlled at this time. I do think we could do better. Stop Pristiq and start low-dose  Remeron at night. Continue Xanax. Refer to behavioral health here in [place].  Recheck on July 12.   Orders Placed This Encounter  Procedures  . Ambulatory referral to Behavioral Health    Referral Priority:   Routine    Referral Type:   Psychiatric    Referral Reason:   Specialty Services Required    Requested Specialty:   Behavioral Health    Number of Visits Requested:   1   Meds ordered this encounter  Medications  . mirtazapine (REMERON) 7.5 MG tablet    Sig: Take 1 tablet (7.5 mg total) by mouth at bedtime.    Dispense:  30 tablet    Refill:  1     Discussed warning signs or symptoms. Please see discharge instructions. Patient expresses understanding.

## 2017-01-27 ENCOUNTER — Ambulatory Visit: Payer: Self-pay | Admitting: Family Medicine

## 2017-01-27 DIAGNOSIS — N183 Chronic kidney disease, stage 3 (moderate): Secondary | ICD-10-CM | POA: Diagnosis not present

## 2017-01-27 DIAGNOSIS — G8929 Other chronic pain: Secondary | ICD-10-CM | POA: Diagnosis not present

## 2017-01-27 DIAGNOSIS — I129 Hypertensive chronic kidney disease with stage 1 through stage 4 chronic kidney disease, or unspecified chronic kidney disease: Secondary | ICD-10-CM | POA: Diagnosis not present

## 2017-01-27 DIAGNOSIS — M1612 Unilateral primary osteoarthritis, left hip: Secondary | ICD-10-CM | POA: Diagnosis not present

## 2017-01-27 DIAGNOSIS — M791 Myalgia: Secondary | ICD-10-CM | POA: Diagnosis not present

## 2017-01-27 DIAGNOSIS — M545 Low back pain: Secondary | ICD-10-CM | POA: Diagnosis not present

## 2017-01-29 DIAGNOSIS — M791 Myalgia: Secondary | ICD-10-CM | POA: Diagnosis not present

## 2017-01-29 DIAGNOSIS — M1612 Unilateral primary osteoarthritis, left hip: Secondary | ICD-10-CM | POA: Diagnosis not present

## 2017-01-29 DIAGNOSIS — I129 Hypertensive chronic kidney disease with stage 1 through stage 4 chronic kidney disease, or unspecified chronic kidney disease: Secondary | ICD-10-CM | POA: Diagnosis not present

## 2017-01-29 DIAGNOSIS — N183 Chronic kidney disease, stage 3 (moderate): Secondary | ICD-10-CM | POA: Diagnosis not present

## 2017-01-29 DIAGNOSIS — G8929 Other chronic pain: Secondary | ICD-10-CM | POA: Diagnosis not present

## 2017-01-29 DIAGNOSIS — M545 Low back pain: Secondary | ICD-10-CM | POA: Diagnosis not present

## 2017-01-30 ENCOUNTER — Telehealth: Payer: Self-pay

## 2017-01-30 DIAGNOSIS — M791 Myalgia: Secondary | ICD-10-CM | POA: Diagnosis not present

## 2017-01-30 DIAGNOSIS — N183 Chronic kidney disease, stage 3 (moderate): Secondary | ICD-10-CM | POA: Diagnosis not present

## 2017-01-30 DIAGNOSIS — M545 Low back pain: Secondary | ICD-10-CM | POA: Diagnosis not present

## 2017-01-30 DIAGNOSIS — M1612 Unilateral primary osteoarthritis, left hip: Secondary | ICD-10-CM | POA: Diagnosis not present

## 2017-01-30 DIAGNOSIS — I129 Hypertensive chronic kidney disease with stage 1 through stage 4 chronic kidney disease, or unspecified chronic kidney disease: Secondary | ICD-10-CM | POA: Diagnosis not present

## 2017-01-30 DIAGNOSIS — G8929 Other chronic pain: Secondary | ICD-10-CM | POA: Diagnosis not present

## 2017-01-30 NOTE — Telephone Encounter (Signed)
Dr Georgina Snell authorized order for medical social work consult for eval in home care assistance.  Called [employer] with order.

## 2017-01-30 NOTE — Telephone Encounter (Signed)
[facility] would like a medical social work consult for evaluation for in home care assistance.  Is it ok to approve?

## 2017-01-31 ENCOUNTER — Other Ambulatory Visit: Payer: Self-pay

## 2017-01-31 DIAGNOSIS — M791 Myalgia: Secondary | ICD-10-CM | POA: Diagnosis not present

## 2017-01-31 DIAGNOSIS — M545 Low back pain: Secondary | ICD-10-CM | POA: Diagnosis not present

## 2017-01-31 DIAGNOSIS — N183 Chronic kidney disease, stage 3 (moderate): Secondary | ICD-10-CM | POA: Diagnosis not present

## 2017-01-31 DIAGNOSIS — I129 Hypertensive chronic kidney disease with stage 1 through stage 4 chronic kidney disease, or unspecified chronic kidney disease: Secondary | ICD-10-CM | POA: Diagnosis not present

## 2017-01-31 DIAGNOSIS — G8929 Other chronic pain: Secondary | ICD-10-CM | POA: Diagnosis not present

## 2017-01-31 DIAGNOSIS — M1612 Unilateral primary osteoarthritis, left hip: Secondary | ICD-10-CM | POA: Diagnosis not present

## 2017-02-01 DIAGNOSIS — M545 Low back pain: Secondary | ICD-10-CM | POA: Diagnosis not present

## 2017-02-01 DIAGNOSIS — M1612 Unilateral primary osteoarthritis, left hip: Secondary | ICD-10-CM | POA: Diagnosis not present

## 2017-02-01 DIAGNOSIS — N183 Chronic kidney disease, stage 3 (moderate): Secondary | ICD-10-CM | POA: Diagnosis not present

## 2017-02-01 DIAGNOSIS — M791 Myalgia: Secondary | ICD-10-CM | POA: Diagnosis not present

## 2017-02-01 DIAGNOSIS — I129 Hypertensive chronic kidney disease with stage 1 through stage 4 chronic kidney disease, or unspecified chronic kidney disease: Secondary | ICD-10-CM | POA: Diagnosis not present

## 2017-02-01 DIAGNOSIS — G8929 Other chronic pain: Secondary | ICD-10-CM | POA: Diagnosis not present

## 2017-02-02 DIAGNOSIS — M545 Low back pain: Secondary | ICD-10-CM | POA: Diagnosis not present

## 2017-02-02 DIAGNOSIS — I129 Hypertensive chronic kidney disease with stage 1 through stage 4 chronic kidney disease, or unspecified chronic kidney disease: Secondary | ICD-10-CM | POA: Diagnosis not present

## 2017-02-02 DIAGNOSIS — M791 Myalgia: Secondary | ICD-10-CM | POA: Diagnosis not present

## 2017-02-02 DIAGNOSIS — G8929 Other chronic pain: Secondary | ICD-10-CM | POA: Diagnosis not present

## 2017-02-02 DIAGNOSIS — M1612 Unilateral primary osteoarthritis, left hip: Secondary | ICD-10-CM | POA: Diagnosis not present

## 2017-02-02 DIAGNOSIS — N183 Chronic kidney disease, stage 3 (moderate): Secondary | ICD-10-CM | POA: Diagnosis not present

## 2017-02-02 NOTE — Patient Outreach (Signed)
[facility]) Care Management  02/02/2017  [name] 11-19-1932 [id number]   Late entry 01/31/17 Telephone call to patient regarding transition of care follow up. HIPAA verified with patient. Patient states she recently had an emergency room visit due to have a panic attack. Patient states her doctor changed her medication and she seems to being doing better. Patient states her doctor has removed her cast and applied a brace.  Patient states she continues with her rehab weekly.  Patient reports she is unable to talk further at this time. States her home health aid has just arrived.  Patient request call back at another time.   PLAN:  RNCM will follow up with patient within 1 week.   Quinn Plowman RN,BSN,CCM [facility] Telephonic  [phone]

## 2017-02-03 DIAGNOSIS — G8929 Other chronic pain: Secondary | ICD-10-CM | POA: Diagnosis not present

## 2017-02-03 DIAGNOSIS — M545 Low back pain: Secondary | ICD-10-CM | POA: Diagnosis not present

## 2017-02-03 DIAGNOSIS — M1612 Unilateral primary osteoarthritis, left hip: Secondary | ICD-10-CM | POA: Diagnosis not present

## 2017-02-03 DIAGNOSIS — I129 Hypertensive chronic kidney disease with stage 1 through stage 4 chronic kidney disease, or unspecified chronic kidney disease: Secondary | ICD-10-CM | POA: Diagnosis not present

## 2017-02-03 DIAGNOSIS — M791 Myalgia: Secondary | ICD-10-CM | POA: Diagnosis not present

## 2017-02-03 DIAGNOSIS — N183 Chronic kidney disease, stage 3 (moderate): Secondary | ICD-10-CM | POA: Diagnosis not present

## 2017-02-06 ENCOUNTER — Ambulatory Visit: Payer: Self-pay

## 2017-02-06 DIAGNOSIS — M791 Myalgia: Secondary | ICD-10-CM | POA: Diagnosis not present

## 2017-02-06 DIAGNOSIS — N183 Chronic kidney disease, stage 3 (moderate): Secondary | ICD-10-CM | POA: Diagnosis not present

## 2017-02-06 DIAGNOSIS — I129 Hypertensive chronic kidney disease with stage 1 through stage 4 chronic kidney disease, or unspecified chronic kidney disease: Secondary | ICD-10-CM | POA: Diagnosis not present

## 2017-02-06 DIAGNOSIS — M1612 Unilateral primary osteoarthritis, left hip: Secondary | ICD-10-CM | POA: Diagnosis not present

## 2017-02-06 DIAGNOSIS — G8929 Other chronic pain: Secondary | ICD-10-CM | POA: Diagnosis not present

## 2017-02-06 DIAGNOSIS — M545 Low back pain: Secondary | ICD-10-CM | POA: Diagnosis not present

## 2017-02-07 DIAGNOSIS — G8929 Other chronic pain: Secondary | ICD-10-CM | POA: Diagnosis not present

## 2017-02-07 DIAGNOSIS — I129 Hypertensive chronic kidney disease with stage 1 through stage 4 chronic kidney disease, or unspecified chronic kidney disease: Secondary | ICD-10-CM | POA: Diagnosis not present

## 2017-02-07 DIAGNOSIS — N183 Chronic kidney disease, stage 3 (moderate): Secondary | ICD-10-CM | POA: Diagnosis not present

## 2017-02-07 DIAGNOSIS — M1612 Unilateral primary osteoarthritis, left hip: Secondary | ICD-10-CM | POA: Diagnosis not present

## 2017-02-07 DIAGNOSIS — M791 Myalgia: Secondary | ICD-10-CM | POA: Diagnosis not present

## 2017-02-07 DIAGNOSIS — M545 Low back pain: Secondary | ICD-10-CM | POA: Diagnosis not present

## 2017-02-08 DIAGNOSIS — M069 Rheumatoid arthritis, unspecified: Secondary | ICD-10-CM | POA: Diagnosis not present

## 2017-02-08 DIAGNOSIS — M109 Gout, unspecified: Secondary | ICD-10-CM | POA: Diagnosis not present

## 2017-02-08 DIAGNOSIS — E785 Hyperlipidemia, unspecified: Secondary | ICD-10-CM | POA: Diagnosis not present

## 2017-02-08 DIAGNOSIS — G894 Chronic pain syndrome: Secondary | ICD-10-CM | POA: Diagnosis not present

## 2017-02-08 DIAGNOSIS — Z96641 Presence of right artificial hip joint: Secondary | ICD-10-CM | POA: Diagnosis not present

## 2017-02-08 DIAGNOSIS — S52572D Other intraarticular fracture of lower end of left radius, subsequent encounter for closed fracture with routine healing: Secondary | ICD-10-CM | POA: Diagnosis not present

## 2017-02-08 DIAGNOSIS — Z96611 Presence of right artificial shoulder joint: Secondary | ICD-10-CM | POA: Diagnosis not present

## 2017-02-08 DIAGNOSIS — Z9181 History of falling: Secondary | ICD-10-CM | POA: Diagnosis not present

## 2017-02-08 DIAGNOSIS — Z96651 Presence of right artificial knee joint: Secondary | ICD-10-CM | POA: Diagnosis not present

## 2017-02-08 DIAGNOSIS — J45909 Unspecified asthma, uncomplicated: Secondary | ICD-10-CM | POA: Diagnosis not present

## 2017-02-08 DIAGNOSIS — I129 Hypertensive chronic kidney disease with stage 1 through stage 4 chronic kidney disease, or unspecified chronic kidney disease: Secondary | ICD-10-CM | POA: Diagnosis not present

## 2017-02-08 DIAGNOSIS — M48061 Spinal stenosis, lumbar region without neurogenic claudication: Secondary | ICD-10-CM | POA: Diagnosis not present

## 2017-02-08 DIAGNOSIS — M1712 Unilateral primary osteoarthritis, left knee: Secondary | ICD-10-CM | POA: Diagnosis not present

## 2017-02-08 DIAGNOSIS — F329 Major depressive disorder, single episode, unspecified: Secondary | ICD-10-CM | POA: Diagnosis not present

## 2017-02-08 DIAGNOSIS — N183 Chronic kidney disease, stage 3 (moderate): Secondary | ICD-10-CM | POA: Diagnosis not present

## 2017-02-08 DIAGNOSIS — K219 Gastro-esophageal reflux disease without esophagitis: Secondary | ICD-10-CM | POA: Diagnosis not present

## 2017-02-08 DIAGNOSIS — M797 Fibromyalgia: Secondary | ICD-10-CM | POA: Diagnosis not present

## 2017-02-08 DIAGNOSIS — M810 Age-related osteoporosis without current pathological fracture: Secondary | ICD-10-CM | POA: Diagnosis not present

## 2017-02-08 DIAGNOSIS — F410 Panic disorder [episodic paroxysmal anxiety] without agoraphobia: Secondary | ICD-10-CM | POA: Diagnosis not present

## 2017-02-08 DIAGNOSIS — M1612 Unilateral primary osteoarthritis, left hip: Secondary | ICD-10-CM | POA: Diagnosis not present

## 2017-02-09 ENCOUNTER — Ambulatory Visit (INDEPENDENT_AMBULATORY_CARE_PROVIDER_SITE_OTHER): Payer: Medicare Other

## 2017-02-09 ENCOUNTER — Encounter: Payer: Self-pay | Admitting: Family Medicine

## 2017-02-09 ENCOUNTER — Ambulatory Visit (INDEPENDENT_AMBULATORY_CARE_PROVIDER_SITE_OTHER): Payer: Medicare Other | Admitting: Family Medicine

## 2017-02-09 VITALS — BP 117/75 | HR 106 | Wt 156.0 lb

## 2017-02-09 DIAGNOSIS — J9811 Atelectasis: Secondary | ICD-10-CM | POA: Diagnosis not present

## 2017-02-09 DIAGNOSIS — M545 Low back pain, unspecified: Secondary | ICD-10-CM

## 2017-02-09 DIAGNOSIS — Z23 Encounter for immunization: Secondary | ICD-10-CM | POA: Diagnosis not present

## 2017-02-09 DIAGNOSIS — F329 Major depressive disorder, single episode, unspecified: Secondary | ICD-10-CM | POA: Diagnosis not present

## 2017-02-09 DIAGNOSIS — R059 Cough, unspecified: Secondary | ICD-10-CM

## 2017-02-09 DIAGNOSIS — F331 Major depressive disorder, recurrent, moderate: Secondary | ICD-10-CM

## 2017-02-09 DIAGNOSIS — J449 Chronic obstructive pulmonary disease, unspecified: Secondary | ICD-10-CM | POA: Diagnosis not present

## 2017-02-09 DIAGNOSIS — S62102A Fracture of unspecified carpal bone, left wrist, initial encounter for closed fracture: Secondary | ICD-10-CM

## 2017-02-09 DIAGNOSIS — R05 Cough: Secondary | ICD-10-CM | POA: Diagnosis not present

## 2017-02-09 DIAGNOSIS — F419 Anxiety disorder, unspecified: Secondary | ICD-10-CM

## 2017-02-09 DIAGNOSIS — M069 Rheumatoid arthritis, unspecified: Secondary | ICD-10-CM | POA: Diagnosis not present

## 2017-02-09 MED ORDER — MIRTAZAPINE 15 MG PO TABS
15.0000 mg | ORAL_TABLET | Freq: Every day | ORAL | 1 refills | Status: DC
Start: 2017-02-09 — End: 2017-02-24

## 2017-02-09 MED ORDER — OXYCODONE HCL 10 MG PO TABS: 10.0000 mg | ORAL_TABLET | Freq: Four times a day (QID) | ORAL | 0 refills | Status: DC | PRN

## 2017-02-09 NOTE — Progress Notes (Signed)
[name] is a 83 y.o. female who presents to [facility] today for follow-up depression and anxiety as well as chronic pain and left wrist fracture.  Depression and anxiety: [name] has had lifelong depression and anxiety symptoms. She has been on and not tolerated or failed multiple different medications. Most recently she was started on mirtazapine. She currently takes 7.5 mg at bedtime. Her family reports that she is doing a bit better although she does not feel that she is any better. She notes that she continues to have difficulty sleeping as well. She notes that she is tolerating mirtazapine quite well.  Chronic pain: Patient has chronic pain. She previously was seen in a pain clinic but has difficulty with transportation. I recently took over her pain management. She currently takes oxycodone 10 mg up to 4 times daily and uses a fentanyl patch 12 g per hour. She does not think the fentanyl patch does anything would like to stop it.  Left wrist fracture: Patient is several months out from a left distal radius fracture that was treated with ORIF by Dr. Caralyn Guile in [place]. She currently is using a wrist splint and feels pretty well. She does note some numbness into the palm of her hand following surgery.  Cough: Patient notes a mild nonproductive cough for the past several weeks. She denies fevers or shortness of breath but notes the cough is not getting much better. She has tried some over-the-counter medications.   Past Medical History:  Diagnosis Date  . Anxiety   . Arthritis    osteoarthritis  . Asthma   . C. difficile diarrhea   . Candida esophagitis ([place])   . Chronic sinusitis   . Depression   . Depression   . Diverticulosis   . DJD (degenerative joint disease)     L5 compression fracture  . Edema extremities   . Fibromyalgia   . GERD  (gastroesophageal reflux disease)   . Gout   . Hyperlipidemia   . Hypertension   . Osteoarthritis   . Osteopenia   . Panic disorder   . Renal failure, unspecified   . Spinal stenosis of lumbar region   . Tubular adenoma of colon   . Wrist fracture, left    Past Surgical History:  Procedure Laterality Date  . ADENOIDECTOMY    . CARPAL TUNNEL RELEASE  2007, 2009   Bilateral  . CATARACT EXTRACTION, BILATERAL    . CHOANAL ADENIODECTOMY    . DIAGNOSTIC LARYNGOSCOPY N/A 11/05/2015   Procedure: DIAGNOSTIC LARYNGOSCOPY AND ESOPHAGOSCOPY;  Surgeon: Rozetta Nunnery, MD;  Location: [facility];  Service: ENT;  Laterality: N/A;  . ganglion cyct removal on fingers     right  . ganglion cyst  wrist    . KNEE ARTHROSCOPY     right  . NASAL SINUS SURGERY    . OPEN REDUCTION INTERNAL FIXATION (ORIF) DISTAL RADIAL FRACTURE Left 12/18/2016   Procedure: OPEN REDUCTION INTERNAL FIXATION (ORIF) DISTAL RADIAL FRACTURE;  Surgeon: Iran Planas, MD;  Location: [facility];  Service: Orthopedics;  Laterality: Left;  . SHOULDER SURGERY  2004, 07/30/10, 01/2011   after her right humeral neck fracture, revision hemiarthroplasty 2004 for persistent pain, revision reverse arthroplasty December 2011 for persistent pain, incision and drainage with poly-exchange 01/26/2011 for possible prosthetic joint infection.  . TONSILLECTOMY AND ADENOIDECTOMY    . TOTAL ABDOMINAL HYSTERECTOMY    . TOTAL HIP ARTHROPLASTY  2004  right  . TOTAL KNEE ARTHROPLASTY  2006   right  . TOTAL SHOULDER REPLACEMENT  2002   right   Social History  Substance Use Topics  . Smoking status: Never Smoker  . Smokeless tobacco: Never Used  . Alcohol use No   family history includes Bladder Cancer in her father; Breast cancer in her unknown relative; Colon polyps in her unknown relative; Depression in her son; Diabetes in her mother; Hypertension in her mother.  ROS as above:  Medications: Current Outpatient Prescriptions  Medication Sig  Dispense Refill  . ALPRAZolam (XANAX) 0.5 MG tablet Take 1 tablet (0.5 mg total) by mouth 2 (two) times daily as needed for anxiety. 15 tablet 0  . AMBULATORY NON FORMULARY MEDICATION Rolling Butikofer.  Dx: Rheumatoid Arthritis.  Use daily as needed for ambulation. 1 Units 0  . amLODipine (NORVASC) 10 MG tablet take 1 tablet by mouth once daily 90 tablet 2  . Calcium Carbonate-Vitamin D (CALCIUM 600+D) 600-400 MG-UNIT per tablet Take 1 tablet by mouth 2 (two) times daily.    . diclofenac sodium (VOLTAREN) 1 % GEL Apply 1 application topically 4 (four) times daily.  0  . diphenhydrAMINE (BENADRYL) 25 MG tablet Take 25 mg by mouth every 6 (six) hours as needed. For allergies    . EPINEPHrine (ADRENACLICK) 0.3 GU/5.4 mL IJ SOAJ injection Inject 0.3 mLs (0.3 mg total) into the muscle once. 1 Device 1  . hydrochlorothiazide (HYDRODIURIL) 25 MG tablet take 1 tablet by mouth every morning for blood pressure 90 tablet 1  . mirtazapine (REMERON) 15 MG tablet Take 1 tablet (15 mg total) by mouth at bedtime. 30 tablet 1  . montelukast (SINGULAIR) 10 MG tablet Take 1 tablet (10 mg total) by mouth at bedtime. 90 tablet 3  . omeprazole (PRILOSEC) 20 MG capsule Take 20 mg by mouth daily.  0  . ondansetron (ZOFRAN) 4 MG tablet Take 1 tablet (4 mg total) by mouth every 8 (eight) hours as needed for nausea or vomiting. 20 tablet 12  . Oxycodone HCl 10 MG TABS Take 1 tablet (10 mg total) by mouth 4 (four) times daily as needed (for pain). 120 tablet 0  . polyethylene glycol (MIRALAX / GLYCOLAX) packet Take 17 g by mouth daily. 14 each 0  . potassium chloride SA (K-DUR,KLOR-CON) 20 MEQ tablet take 1 tablet by mouth once daily 30 tablet 1  . simvastatin (ZOCOR) 10 MG tablet TAKE 1 TABLET BY MOUTH ONCE DAILY 90 tablet 0  . zoledronic acid (RECLAST) 5 MG/100ML SOLN Inject 5 mg into the vein. Once yearly. In June     No current facility-administered medications for this visit.    Allergies  Allergen Reactions  . Bee  Venom Anaphylaxis    Has epi-pen  . Ceftin [Cefuroxime Axetil] Anaphylaxis  . Ciprofloxacin Anaphylaxis and Palpitations  . Ephedrine Other (See Comments) and Palpitations    "knocks me out"  . Sulfonamide Derivatives Anaphylaxis  . Telithromycin Anaphylaxis    Reaction: also blurred vision   . Valsartan Anaphylaxis  . Verapamil Anaphylaxis  . Venlafaxine Other (See Comments)    Insomnia, panic  . Beta Adrenergic Blockers     bradycardia  . Cephalosporins     Allergic Reaction  . Clindamycin/Lincomycin     Dry skin and itching/ Cdiff  . Cymbalta [Duloxetine Hcl] Other (See Comments)    Reaction:Confusion and " I fall down"  . Doxazosin     "blurred vision and irritable"  . Gabapentin Other (See  Comments)    Reaction: headache  . Hydralazine     HA, Diarrhea  . Lamotrigine     Patient unsure at this time  . Morphine Other (See Comments)    Medication has no effect with pain  . Oxycodone-Acetaminophen Hives    Takes plain oxy IR at home (May 2018)  . Pregabalin Swelling  . Prochlorperazine Edisylate   . Pseudoephedrine Other (See Comments)    Reaction: hyperventilates and blacks out  . Relafen [Nabumetone] Swelling  . Zoloft [Sertraline] Other (See Comments)    "nervous/jittery"  . Doxycycline Hives    Health Maintenance Health Maintenance  Topic Date Due  . INFLUENZA VACCINE  03/01/2017  . TETANUS/TDAP  02/20/2020  . DEXA SCAN  Completed  . PNA vac Low Risk Adult  Completed     Exam:  BP 117/75   Pulse (!) 106   Wt 156 lb (70.8 kg)   SpO2 92%   BMI 29.48 kg/m  Gen: Well NAD HEENT: EOMI,  MMM Lungs: Normal work of breathing. CTABL Heart: Mild tachycardia no MRG Abd: NABS, Soft. Nondistended, Nontender Exts: Brisk capillary refill, warm and well perfused.  Psych: Alert and oriented normal speech thought process. Affect is tearful. No SI or HI expressed. MSK: Thoracic kyphosis with cervical hyperextension present. Left wrist has a well-appearing  incision normal alignment nontender.  GAD 7 : Generalized Anxiety Score 02/09/2017 01/26/2017  Nervous, Anxious, on Edge 3 3  Control/stop worrying 3 3  Worry too much - different things 3 3  Trouble relaxing 3 3  Restless 0 0  Easily annoyed or irritable 2 1  Afraid - awful might happen 2 2  Total GAD 7 Score 16 15    Depression screen [facility] 2/9 02/09/2017 01/26/2017 01/05/2017 09/07/2016 09/02/2016  Decreased Interest 3 1 1  0 2  Down, Depressed, Hopeless 3 2 3 3 2   PHQ - 2 Score 6 3 4 3 4   Altered sleeping 3 3 1 1 3   Tired, decreased energy 3 3 3 3 3   Change in appetite 1 1 1  0 2  Feeling bad or failure about yourself  3 1 1 1 2   Trouble concentrating 3 2 1  0 2  Moving slowly or fidgety/restless 0 0 1 0 1  Suicidal thoughts 1 0 0 0 0  PHQ-9 Score 20 13 12 8 17   Difficult doing work/chores - - Very difficult Somewhat difficult Very difficult      No results found for this or any previous visit (from the past 72 hour(s)). No results found.    Assessment and Plan: 83 y.o. female with  Depression and anxiety: Improving but not ideally controlled. Plan to increase Remeron as this will also help sleep symptoms. Recheck in one month.  Chronic pain: Plan to discontinue fentanyl patch and continue current oxycodone. Plan to recheck in one month.  Left wrist fracture: Doing well. Continue comanagement with hand surgery.  Cough: Unclear etiology likely viral. Plan on chest x-ray at this time.  Recheck in one month.   Orders Placed This Encounter  Procedures  . DG Chest 2 View    Order Specific Question:   Reason for exam:    Answer:   Cough, assess intra-thoracic pathology    Order Specific Question:   Preferred imaging location?    Answer:   [facility]  . Pneumococcal polysaccharide vaccine 23-valent greater than or equal to 2yo subcutaneous/IM   Meds ordered this encounter  Medications  . Oxycodone HCl 10  MG TABS    Sig: Take 1 tablet (10 mg total) by mouth 4  (four) times daily as needed (for pain).    Dispense:  120 tablet    Refill:  0  . mirtazapine (REMERON) 15 MG tablet    Sig: Take 1 tablet (15 mg total) by mouth at bedtime.    Dispense:  30 tablet    Refill:  1     Discussed warning signs or symptoms. Please see discharge instructions. Patient expresses understanding.

## 2017-02-09 NOTE — Patient Instructions (Addendum)
Thank you for coming in today. STOP Fentyl patch.  Continue Oxycodone.  Increase the REMERON to 15mg  at bedtime.  We will continue to follow sleep issues.   We will get a chest xray today.   Call or go to the emergency room if you get worse, have trouble breathing, have chest pains, or palpitations.

## 2017-02-10 ENCOUNTER — Ambulatory Visit: Payer: Self-pay

## 2017-02-10 DIAGNOSIS — I129 Hypertensive chronic kidney disease with stage 1 through stage 4 chronic kidney disease, or unspecified chronic kidney disease: Secondary | ICD-10-CM | POA: Diagnosis not present

## 2017-02-10 DIAGNOSIS — S52572D Other intraarticular fracture of lower end of left radius, subsequent encounter for closed fracture with routine healing: Secondary | ICD-10-CM | POA: Diagnosis not present

## 2017-02-10 DIAGNOSIS — M48061 Spinal stenosis, lumbar region without neurogenic claudication: Secondary | ICD-10-CM | POA: Diagnosis not present

## 2017-02-10 DIAGNOSIS — M1712 Unilateral primary osteoarthritis, left knee: Secondary | ICD-10-CM | POA: Diagnosis not present

## 2017-02-10 DIAGNOSIS — G894 Chronic pain syndrome: Secondary | ICD-10-CM | POA: Diagnosis not present

## 2017-02-10 DIAGNOSIS — M1612 Unilateral primary osteoarthritis, left hip: Secondary | ICD-10-CM | POA: Diagnosis not present

## 2017-02-12 DIAGNOSIS — J9811 Atelectasis: Secondary | ICD-10-CM | POA: Diagnosis not present

## 2017-02-12 DIAGNOSIS — R000 Tachycardia, unspecified: Secondary | ICD-10-CM | POA: Diagnosis not present

## 2017-02-12 DIAGNOSIS — Z888 Allergy status to other drugs, medicaments and biological substances status: Secondary | ICD-10-CM | POA: Diagnosis not present

## 2017-02-12 DIAGNOSIS — R072 Precordial pain: Secondary | ICD-10-CM | POA: Diagnosis not present

## 2017-02-12 DIAGNOSIS — J90 Pleural effusion, not elsewhere classified: Secondary | ICD-10-CM | POA: Diagnosis not present

## 2017-02-12 DIAGNOSIS — F419 Anxiety disorder, unspecified: Secondary | ICD-10-CM | POA: Diagnosis not present

## 2017-02-12 DIAGNOSIS — I700 Atherosclerosis of aorta: Secondary | ICD-10-CM | POA: Diagnosis not present

## 2017-02-12 DIAGNOSIS — R0602 Shortness of breath: Secondary | ICD-10-CM | POA: Diagnosis not present

## 2017-02-12 DIAGNOSIS — R079 Chest pain, unspecified: Secondary | ICD-10-CM | POA: Diagnosis not present

## 2017-02-12 DIAGNOSIS — I444 Left anterior fascicular block: Secondary | ICD-10-CM | POA: Diagnosis not present

## 2017-02-12 DIAGNOSIS — M791 Myalgia: Secondary | ICD-10-CM | POA: Diagnosis not present

## 2017-02-12 DIAGNOSIS — R7989 Other specified abnormal findings of blood chemistry: Secondary | ICD-10-CM | POA: Diagnosis not present

## 2017-02-12 DIAGNOSIS — Z79891 Long term (current) use of opiate analgesic: Secondary | ICD-10-CM | POA: Diagnosis not present

## 2017-02-12 DIAGNOSIS — Z885 Allergy status to narcotic agent status: Secondary | ICD-10-CM | POA: Diagnosis not present

## 2017-02-12 DIAGNOSIS — Z881 Allergy status to other antibiotic agents status: Secondary | ICD-10-CM | POA: Diagnosis not present

## 2017-02-12 DIAGNOSIS — Z79899 Other long term (current) drug therapy: Secondary | ICD-10-CM | POA: Diagnosis not present

## 2017-02-12 DIAGNOSIS — Z7951 Long term (current) use of inhaled steroids: Secondary | ICD-10-CM | POA: Diagnosis not present

## 2017-02-12 DIAGNOSIS — M25512 Pain in left shoulder: Secondary | ICD-10-CM | POA: Diagnosis not present

## 2017-02-12 DIAGNOSIS — Z91030 Bee allergy status: Secondary | ICD-10-CM | POA: Diagnosis not present

## 2017-02-12 DIAGNOSIS — I10 Essential (primary) hypertension: Secondary | ICD-10-CM | POA: Diagnosis not present

## 2017-02-12 DIAGNOSIS — R0789 Other chest pain: Secondary | ICD-10-CM | POA: Diagnosis not present

## 2017-02-12 DIAGNOSIS — M546 Pain in thoracic spine: Secondary | ICD-10-CM | POA: Diagnosis not present

## 2017-02-12 DIAGNOSIS — M25511 Pain in right shoulder: Secondary | ICD-10-CM | POA: Diagnosis not present

## 2017-02-14 ENCOUNTER — Other Ambulatory Visit: Payer: Self-pay

## 2017-02-14 DIAGNOSIS — M48061 Spinal stenosis, lumbar region without neurogenic claudication: Secondary | ICD-10-CM | POA: Diagnosis not present

## 2017-02-14 DIAGNOSIS — S52572D Other intraarticular fracture of lower end of left radius, subsequent encounter for closed fracture with routine healing: Secondary | ICD-10-CM | POA: Diagnosis not present

## 2017-02-14 DIAGNOSIS — M1712 Unilateral primary osteoarthritis, left knee: Secondary | ICD-10-CM | POA: Diagnosis not present

## 2017-02-14 DIAGNOSIS — I129 Hypertensive chronic kidney disease with stage 1 through stage 4 chronic kidney disease, or unspecified chronic kidney disease: Secondary | ICD-10-CM | POA: Diagnosis not present

## 2017-02-14 DIAGNOSIS — G894 Chronic pain syndrome: Secondary | ICD-10-CM | POA: Diagnosis not present

## 2017-02-14 DIAGNOSIS — M1612 Unilateral primary osteoarthritis, left hip: Secondary | ICD-10-CM | POA: Diagnosis not present

## 2017-02-14 NOTE — Patient Outreach (Signed)
[facility]) Care Management  02/14/2017  NIKIA LEVELS 1932-12-30 [id number]   Transition of care:  Hospital course 5/19/ 18 to 12/20/16 Skilled nursing facility 12/20/16 to 12/30/16 Status post left wrist fracture, ORIF of left wrist Attempt #2  Second telephone outreach to patient for transition of care follow up. HIPAA compliant voice message left with call back phone number.   PLAN: RNCM will attempt 3rd telephone outreach to patient within 1 week.   Quinn Plowman RN,BSN,CCM [facility] Telephonic  [phone]

## 2017-02-15 ENCOUNTER — Encounter: Payer: Self-pay | Admitting: Family Medicine

## 2017-02-15 ENCOUNTER — Ambulatory Visit (INDEPENDENT_AMBULATORY_CARE_PROVIDER_SITE_OTHER): Payer: Medicare Other | Admitting: Family Medicine

## 2017-02-15 VITALS — BP 144/75 | HR 109 | Ht 61.0 in | Wt 155.0 lb

## 2017-02-15 DIAGNOSIS — F32A Depression, unspecified: Secondary | ICD-10-CM

## 2017-02-15 DIAGNOSIS — F419 Anxiety disorder, unspecified: Secondary | ICD-10-CM

## 2017-02-15 DIAGNOSIS — G894 Chronic pain syndrome: Secondary | ICD-10-CM | POA: Diagnosis not present

## 2017-02-15 DIAGNOSIS — F329 Major depressive disorder, single episode, unspecified: Secondary | ICD-10-CM | POA: Diagnosis not present

## 2017-02-15 DIAGNOSIS — G8929 Other chronic pain: Secondary | ICD-10-CM | POA: Insufficient documentation

## 2017-02-15 MED ORDER — LAMOTRIGINE 25 MG PO TABS: 25.0000 mg | ORAL_TABLET | Freq: Every day | ORAL | 0 refills | Status: DC

## 2017-02-15 NOTE — Patient Instructions (Signed)
Thank you for coming in today. Continue current medicines.  Start Lamictal 25mg  daily.  Recheck in 3 weeks.  For pain consider restarting fentyl patches. See if it helps.   If not better or unable to tolerate Lamictal we may consider medicines like Risperdal.   Lamotrigine tablets What is this medicine? LAMOTRIGINE (la MOE Hendricks Limes) is used to control seizures in adults and children with epilepsy and Lennox-Gastaut syndrome. It is also used in adults to treat bipolar disorder. This medicine may be used for other purposes; ask your health care provider or pharmacist if you have questions. COMMON BRAND NAME(S): Lamictal What should I tell my health care provider before I take this medicine? They need to know if you have any of these conditions: -a history of depression or bipolar disorder -aseptic meningitis during prior use of lamotrigine -folate deficiency -kidney disease -liver disease -suicidal thoughts, plans, or attempt; a previous suicide attempt by you or a family member -an unusual or allergic reaction to lamotrigine or other seizure medications, other medicines, foods, dyes, or preservatives -pregnant or trying to get pregnant -breast-feeding How should I use this medicine? Take this medicine by mouth with a glass of water. Follow the directions on the prescription label. Do not chew these tablets. If this medicine upsets your stomach, take it with food or milk. Take your doses at regular intervals. Do not take your medicine more often than directed. A special MedGuide will be given to you by the pharmacist with each new prescription and refill. Be sure to read this information carefully each time. Talk to your pediatrician regarding the use of this medicine in children. While this drug may be prescribed for children as young as 2 years for selected conditions, precautions do apply. Overdosage: If you think you have taken too much of this medicine contact a poison control center  or emergency room at once. NOTE: This medicine is only for you. Do not share this medicine with others. What if I miss a dose? If you miss a dose, take it as soon as you can. If it is almost time for your next dose, take only that dose. Do not take double or extra doses. What may interact with this medicine? -carbamazepine -female hormones, including contraceptive or birth control pills -methotrexate -phenobarbital -phenytoin -primidone -pyrimethamine -rifampin -trimethoprim -valproic acid This list may not describe all possible interactions. Give your health care provider a list of all the medicines, herbs, non-prescription drugs, or dietary supplements you use. Also tell them if you smoke, drink alcohol, or use illegal drugs. Some items may interact with your medicine. What should I watch for while using this medicine? Visit your doctor or health care professional for regular checks on your progress. If you take this medicine for seizures, wear a Medic Alert bracelet or necklace. Carry an identification card with information about your condition, medicines, and doctor or health care professional. It is important to take this medicine exactly as directed. When first starting treatment, your dose will need to be adjusted slowly. It may take weeks or months before your dose is stable. You should contact your doctor or health care professional if your seizures get worse or if you have any new types of seizures. Do not stop taking this medicine unless instructed by your doctor or health care professional. Stopping your medicine suddenly can increase your seizures or their severity. Contact your doctor or health care professional right away if you develop a rash while taking this medicine. Rashes may  be very severe and sometimes require treatment in the hospital. Deaths from rashes have occurred. Serious rashes occur more often in children than adults taking this medicine. It is more common for these  serious rashes to occur during the first 2 months of treatment, but a rash can occur at any time. You may get drowsy, dizzy, or have blurred vision. Do not drive, use machinery, or do anything that needs mental alertness until you know how this medicine affects you. To reduce dizzy or fainting spells, do not sit or stand up quickly, especially if you are an older patient. Alcohol can increase drowsiness and dizziness. Avoid alcoholic drinks. If you are taking this medicine for bipolar disorder, it is important to report any changes in your mood to your doctor or health care professional. If your condition gets worse, you get mentally depressed, feel very hyperactive or manic, have difficulty sleeping, or have thoughts of hurting yourself or committing suicide, you need to get help from your health care professional right away. If you are a caregiver for someone taking this medicine for bipolar disorder, you should also report these behavioral changes right away. The use of this medicine may increase the chance of suicidal thoughts or actions. Pay special attention to how you are responding while on this medicine. Your mouth may get dry. Chewing sugarless gum or sucking hard candy, and drinking plenty of water may help. Contact your doctor if the problem does not go away or is severe. Women who become pregnant while using this medicine may enroll in the [place] Pregnancy Registry by calling [phone]. This registry collects information about the safety of antiepileptic drug use during pregnancy. What side effects may I notice from receiving this medicine? Side effects that you should report to your doctor or health care professional as soon as possible: -allergic reactions like skin rash, itching or hives, swelling of the face, lips, or tongue -blurred or double vision -difficulty walking or controlling muscle movements -fever -headache, stiff neck, and sensitivity to  light -painful sores in the mouth, eyes, or nose -redness, blistering, peeling or loosening of the skin, including inside the mouth -severe muscle pain -swollen lymph glands -uncontrollable eye movements -unusual bruising or bleeding -unusually weak or tired -vomiting -worsening of mood, thoughts or actions of suicide or dying -yellowing of the eyes or skin Side effects that usually do not require medical attention (report to your doctor or health care professional if they continue or are bothersome): -diarrhea or constipation -difficulty sleeping -nausea -tremors This list may not describe all possible side effects. Call your doctor for medical advice about side effects. You may report side effects to FDA at 1-800-FDA-1088. Where should I keep my medicine? Keep out of reach of children. Store at room temperature between 15 and 30 degrees C (59 and 86 degrees F). Throw away any unused medicine after the expiration date. NOTE: This sheet is a summary. It may not cover all possible information. If you have questions about this medicine, talk to your doctor, pharmacist, or health care provider.  2018 Elsevier/Gold Standard (2015-08-20 09:29:40)

## 2017-02-15 NOTE — Telephone Encounter (Signed)
closed

## 2017-02-15 NOTE — Progress Notes (Signed)
[name] is a 83 y.o. female who presents to [place]: [place] today for follow-up of anxiety and irritability. This Train notes continued bothersome anxiety and irritability. She continues to take Remeron 15 mg daily. She notes symptoms are quite obnoxious and have not changed since last visit. She thinks perhaps she is a bit worse. She denies any SI or HI. She also uses Xanax twice daily as well which helps a little. On her medication list she has an allergy to Lamictal listed. She is not sure what that was about and cannot think of any serious allergic reaction that may have caused with this medication.  Socially she notes her chronic pain has worsened. A week ago at the last visit we decided to discontinue the fentanyl patch. She thinks perhaps that was a mistake.   Past Medical History:  Diagnosis Date  . Anxiety   . Arthritis    osteoarthritis  . Asthma   . C. difficile diarrhea   . Candida esophagitis ([place])   . Chronic sinusitis   . Depression   . Depression   . Diverticulosis   . DJD (degenerative joint disease)     L5 compression fracture  . Edema extremities   . Fibromyalgia   . GERD (gastroesophageal reflux disease)   . Gout   . Hyperlipidemia   . Hypertension   . Osteoarthritis   . Osteopenia   . Panic disorder   . Renal failure, unspecified   . Spinal stenosis of lumbar region   . Tubular adenoma of colon   . Wrist fracture, left    Past Surgical History:  Procedure Laterality Date  . ADENOIDECTOMY    . CARPAL TUNNEL RELEASE  2007, 2009   Bilateral  . CATARACT EXTRACTION, BILATERAL    . CHOANAL ADENIODECTOMY    . DIAGNOSTIC LARYNGOSCOPY N/A 11/05/2015   Procedure: DIAGNOSTIC LARYNGOSCOPY AND ESOPHAGOSCOPY;  Surgeon: Rozetta Nunnery, MD;  Location: [facility];  Service: ENT;  Laterality: N/A;  . ganglion cyct removal on fingers     right   . ganglion cyst  wrist    . KNEE ARTHROSCOPY     right  . NASAL SINUS SURGERY    . OPEN REDUCTION INTERNAL FIXATION (ORIF) DISTAL RADIAL FRACTURE Left 12/18/2016   Procedure: OPEN REDUCTION INTERNAL FIXATION (ORIF) DISTAL RADIAL FRACTURE;  Surgeon: Iran Planas, MD;  Location: [facility];  Service: Orthopedics;  Laterality: Left;  . SHOULDER SURGERY  2004, 07/30/10, 01/2011   after her right humeral neck fracture, revision hemiarthroplasty 2004 for persistent pain, revision reverse arthroplasty December 2011 for persistent pain, incision and drainage with poly-exchange 01/26/2011 for possible prosthetic joint infection.  . TONSILLECTOMY AND ADENOIDECTOMY    . TOTAL ABDOMINAL HYSTERECTOMY    . TOTAL HIP ARTHROPLASTY  2004   right  . TOTAL KNEE ARTHROPLASTY  2006   right  . TOTAL SHOULDER REPLACEMENT  2002   right   Social History  Substance Use Topics  . Smoking status: Never Smoker  . Smokeless tobacco: Never Used  . Alcohol use No   family history includes Bladder Cancer in her father; Breast cancer in her unknown relative; Colon polyps in her unknown relative; Depression in her son; Diabetes in her mother; Hypertension in her mother.  ROS as above:  Medications: Current Outpatient Prescriptions  Medication Sig Dispense Refill  . ALPRAZolam (XANAX) 0.5 MG tablet Take 1 tablet (0.5 mg total) by mouth 2 (two)  times daily as needed for anxiety. 15 tablet 0  . AMBULATORY NON FORMULARY MEDICATION [place].  Dx: Rheumatoid Arthritis.  Use daily as needed for ambulation. 1 Units 0  . amLODipine (NORVASC) 10 MG tablet take 1 tablet by mouth once daily 90 tablet 2  . Calcium Carbonate-Vitamin D (CALCIUM 600+D) 600-400 MG-UNIT per tablet Take 1 tablet by mouth 2 (two) times daily.    . diclofenac sodium (VOLTAREN) 1 % GEL Apply 1 application topically 4 (four) times daily.  0  . diphenhydrAMINE (BENADRYL) 25 MG tablet Take 25 mg by mouth every 6 (six) hours as needed. For allergies    .  EPINEPHrine (ADRENACLICK) 0.3 NF/6.2 mL IJ SOAJ injection Inject 0.3 mLs (0.3 mg total) into the muscle once. 1 Device 1  . hydrochlorothiazide (HYDRODIURIL) 25 MG tablet take 1 tablet by mouth every morning for blood pressure 90 tablet 1  . mirtazapine (REMERON) 15 MG tablet Take 1 tablet (15 mg total) by mouth at bedtime. 30 tablet 1  . montelukast (SINGULAIR) 10 MG tablet Take 1 tablet (10 mg total) by mouth at bedtime. 90 tablet 3  . omeprazole (PRILOSEC) 20 MG capsule Take 20 mg by mouth daily.  0  . ondansetron (ZOFRAN) 4 MG tablet Take 1 tablet (4 mg total) by mouth every 8 (eight) hours as needed for nausea or vomiting. 20 tablet 12  . Oxycodone HCl 10 MG TABS Take 1 tablet (10 mg total) by mouth 4 (four) times daily as needed (for pain). 120 tablet 0  . polyethylene glycol (MIRALAX / GLYCOLAX) packet Take 17 g by mouth daily. 14 each 0  . potassium chloride SA (K-DUR,KLOR-CON) 20 MEQ tablet take 1 tablet by mouth once daily 30 tablet 1  . simvastatin (ZOCOR) 10 MG tablet TAKE 1 TABLET BY MOUTH ONCE DAILY 90 tablet 0  . zoledronic acid (RECLAST) 5 MG/100ML SOLN Inject 5 mg into the vein. Once yearly. In June    . lamoTRIgine (LAMICTAL) 25 MG tablet Take 1 tablet (25 mg total) by mouth daily. 30 tablet 0   No current facility-administered medications for this visit.    Allergies  Allergen Reactions  . Bee Venom Anaphylaxis    Has epi-pen  . Ceftin [Cefuroxime Axetil] Anaphylaxis  . Ciprofloxacin Anaphylaxis and Palpitations  . Ephedrine Other (See Comments) and Palpitations    "knocks me out"  . Sulfonamide Derivatives Anaphylaxis  . Telithromycin Anaphylaxis    Reaction: also blurred vision   . Valsartan Anaphylaxis  . Verapamil Anaphylaxis  . Venlafaxine Other (See Comments)    Insomnia, panic  . Beta Adrenergic Blockers     bradycardia  . Cephalosporins     Allergic Reaction  . Clindamycin/Lincomycin     Dry skin and itching/ Cdiff  . Cymbalta [Duloxetine Hcl] Other (See  Comments)    Reaction:Confusion and " I fall down"  . Doxazosin     "blurred vision and irritable"  . Gabapentin Other (See Comments)    Reaction: headache  . Hydralazine     HA, Diarrhea  . Lamotrigine     Patient unsure at this time  . Morphine Other (See Comments)    Medication has no effect with pain  . Oxycodone-Acetaminophen Hives    Takes plain oxy IR at home (May 2018)  . Pregabalin Swelling  . Prochlorperazine Edisylate   . Pseudoephedrine Other (See Comments)    Reaction: hyperventilates and blacks out  . Relafen [Nabumetone] Swelling  . Zoloft [Sertraline] Other (See Comments)    "  nervous/jittery"  . Doxycycline Hives    Health Maintenance Health Maintenance  Topic Date Due  . INFLUENZA VACCINE  03/01/2017  . TETANUS/TDAP  02/20/2020  . DEXA SCAN  Completed  . PNA vac Low Risk Adult  Completed     Exam:  BP (!) 144/75   Pulse (!) 109   Ht 5\' 1"  (1.549 m)   Wt 155 lb (70.3 kg)   BMI 29.29 kg/m  Gen: Well NAD HEENT: EOMI,  MMM Lungs: Normal work of breathing. CTABL Heart: RRR no MRG Abd: NABS, Soft. Nondistended, Nontender Exts: Brisk capillary refill, warm and well perfused.  Psych: Alert and oriented normal speech. Thought processes at the perseverative about her husband. Affect is somewhat tearful. No SI or HI expressed. Depression screen [facility] 2/9 02/15/2017 02/09/2017 01/26/2017 01/05/2017 09/07/2016  Decreased Interest 3 3 1 1  0  Down, Depressed, Hopeless 3 3 2 3 3   PHQ - 2 Score 6 6 3 4 3   Altered sleeping 3 3 3 1 1   Tired, decreased energy 3 3 3 3 3   Change in appetite 3 1 1 1  0  Feeling bad or failure about yourself  3 3 1 1 1   Trouble concentrating 3 3 2 1  0  Moving slowly or fidgety/restless 0 0 0 1 0  Suicidal thoughts 3 1 0 0 0  PHQ-9 Score 24 20 13 12 8   Difficult doing work/chores - - - Very difficult Somewhat difficult  Some recent data might be hidden   GAD 7 : Generalized Anxiety Score 02/15/2017 02/09/2017 01/26/2017  Nervous, Anxious,  on Edge 3 3 3   Control/stop worrying 3 3 3   Worry too much - different things 3 3 3   Trouble relaxing 3 3 3   Restless 0 0 0  Easily annoyed or irritable 3 2 1   Afraid - awful might happen 2 2 2   Total GAD 7 Score 17 16 15        No results found for this or any previous visit (from the past 72 hour(s)). No results found.    Assessment and Plan: 83 y.o. female with anxiety symptoms. Patient has significant irritability. Etiology is somewhat clear. Plan to continue Remeron and start Lamictal 25 mg. Recheck in a few weeks. Return sooner if needed.  Chronic pain: Restart fentanyl patch. Recheck in a few weeks.   No orders of the defined types were placed in this encounter.  Meds ordered this encounter  Medications  . lamoTRIgine (LAMICTAL) 25 MG tablet    Sig: Take 1 tablet (25 mg total) by mouth daily.    Dispense:  30 tablet    Refill:  0     Discussed warning signs or symptoms. Please see discharge instructions. Patient expresses understanding.

## 2017-02-16 DIAGNOSIS — M48061 Spinal stenosis, lumbar region without neurogenic claudication: Secondary | ICD-10-CM | POA: Diagnosis not present

## 2017-02-16 DIAGNOSIS — M1612 Unilateral primary osteoarthritis, left hip: Secondary | ICD-10-CM | POA: Diagnosis not present

## 2017-02-16 DIAGNOSIS — M1712 Unilateral primary osteoarthritis, left knee: Secondary | ICD-10-CM | POA: Diagnosis not present

## 2017-02-16 DIAGNOSIS — S52572D Other intraarticular fracture of lower end of left radius, subsequent encounter for closed fracture with routine healing: Secondary | ICD-10-CM | POA: Diagnosis not present

## 2017-02-16 DIAGNOSIS — G894 Chronic pain syndrome: Secondary | ICD-10-CM | POA: Diagnosis not present

## 2017-02-16 DIAGNOSIS — I129 Hypertensive chronic kidney disease with stage 1 through stage 4 chronic kidney disease, or unspecified chronic kidney disease: Secondary | ICD-10-CM | POA: Diagnosis not present

## 2017-02-19 ENCOUNTER — Other Ambulatory Visit: Payer: Self-pay | Admitting: Cardiology

## 2017-02-20 ENCOUNTER — Telehealth: Payer: Self-pay | Admitting: *Deleted

## 2017-02-20 DIAGNOSIS — M1612 Unilateral primary osteoarthritis, left hip: Secondary | ICD-10-CM | POA: Diagnosis not present

## 2017-02-20 DIAGNOSIS — M1712 Unilateral primary osteoarthritis, left knee: Secondary | ICD-10-CM | POA: Diagnosis not present

## 2017-02-20 DIAGNOSIS — S52572D Other intraarticular fracture of lower end of left radius, subsequent encounter for closed fracture with routine healing: Secondary | ICD-10-CM | POA: Diagnosis not present

## 2017-02-20 DIAGNOSIS — I129 Hypertensive chronic kidney disease with stage 1 through stage 4 chronic kidney disease, or unspecified chronic kidney disease: Secondary | ICD-10-CM | POA: Diagnosis not present

## 2017-02-20 DIAGNOSIS — G894 Chronic pain syndrome: Secondary | ICD-10-CM | POA: Diagnosis not present

## 2017-02-20 DIAGNOSIS — M48061 Spinal stenosis, lumbar region without neurogenic claudication: Secondary | ICD-10-CM | POA: Diagnosis not present

## 2017-02-20 MED ORDER — FENTANYL 12 MCG/HR TD PT72: 12.5000 ug | MEDICATED_PATCH | TRANSDERMAL | 0 refills | Status: DC

## 2017-02-20 NOTE — Telephone Encounter (Signed)
Pt informed of recommendations and agreed to plan.[name] [place]

## 2017-02-20 NOTE — Telephone Encounter (Signed)
Pt called and stated that the lamictal that Dr. Georgina Snell gave her is causing her to feel dizzy, nauseated, and not thinking straight. She also reports that she was supposed to get a refill for her patches and did not. Will fwd to pcp for advice.Audelia Hives [place]

## 2017-02-20 NOTE — Telephone Encounter (Signed)
Decrease the lamictal to 1/2 pill. If this is not tolerable then stop.  I have refilled the patches however she should have some left over.  Patches prescription cannot be faxed. We can mail it to the pharmacy or she can pick it up.  Let me know what she wants to do.

## 2017-02-21 ENCOUNTER — Ambulatory Visit: Payer: Self-pay

## 2017-02-21 ENCOUNTER — Telehealth: Payer: Self-pay | Admitting: Family Medicine

## 2017-02-21 NOTE — Telephone Encounter (Signed)
Daughter Danne Harbor would like to ask about her meds, and she is in the middle of a current panic attack. Contact her at [phone]

## 2017-02-22 DIAGNOSIS — M48061 Spinal stenosis, lumbar region without neurogenic claudication: Secondary | ICD-10-CM | POA: Diagnosis not present

## 2017-02-22 DIAGNOSIS — M1712 Unilateral primary osteoarthritis, left knee: Secondary | ICD-10-CM | POA: Diagnosis not present

## 2017-02-22 DIAGNOSIS — G894 Chronic pain syndrome: Secondary | ICD-10-CM | POA: Diagnosis not present

## 2017-02-22 DIAGNOSIS — M1612 Unilateral primary osteoarthritis, left hip: Secondary | ICD-10-CM | POA: Diagnosis not present

## 2017-02-22 DIAGNOSIS — S52572D Other intraarticular fracture of lower end of left radius, subsequent encounter for closed fracture with routine healing: Secondary | ICD-10-CM | POA: Diagnosis not present

## 2017-02-22 DIAGNOSIS — I129 Hypertensive chronic kidney disease with stage 1 through stage 4 chronic kidney disease, or unspecified chronic kidney disease: Secondary | ICD-10-CM | POA: Diagnosis not present

## 2017-02-23 NOTE — Telephone Encounter (Signed)
I discussed the situation

## 2017-02-24 ENCOUNTER — Encounter: Payer: Self-pay | Admitting: Family Medicine

## 2017-02-24 ENCOUNTER — Ambulatory Visit (INDEPENDENT_AMBULATORY_CARE_PROVIDER_SITE_OTHER): Payer: Medicare Other | Admitting: Family Medicine

## 2017-02-24 VITALS — BP 142/64 | HR 94 | Ht 60.0 in | Wt 153.1 lb

## 2017-02-24 DIAGNOSIS — I129 Hypertensive chronic kidney disease with stage 1 through stage 4 chronic kidney disease, or unspecified chronic kidney disease: Secondary | ICD-10-CM | POA: Diagnosis not present

## 2017-02-24 DIAGNOSIS — M1612 Unilateral primary osteoarthritis, left hip: Secondary | ICD-10-CM | POA: Diagnosis not present

## 2017-02-24 DIAGNOSIS — F419 Anxiety disorder, unspecified: Secondary | ICD-10-CM | POA: Diagnosis not present

## 2017-02-24 DIAGNOSIS — F329 Major depressive disorder, single episode, unspecified: Secondary | ICD-10-CM | POA: Diagnosis not present

## 2017-02-24 DIAGNOSIS — M1712 Unilateral primary osteoarthritis, left knee: Secondary | ICD-10-CM | POA: Diagnosis not present

## 2017-02-24 DIAGNOSIS — G894 Chronic pain syndrome: Secondary | ICD-10-CM | POA: Diagnosis not present

## 2017-02-24 DIAGNOSIS — M48061 Spinal stenosis, lumbar region without neurogenic claudication: Secondary | ICD-10-CM | POA: Diagnosis not present

## 2017-02-24 DIAGNOSIS — S52572D Other intraarticular fracture of lower end of left radius, subsequent encounter for closed fracture with routine healing: Secondary | ICD-10-CM | POA: Diagnosis not present

## 2017-02-24 MED ORDER — MIRTAZAPINE 7.5 MG PO TABS: 7.5000 mg | ORAL_TABLET | Freq: Every day | ORAL | 1 refills | Status: DC

## 2017-02-24 NOTE — Progress Notes (Signed)
[name] is a 83 y.o. female who presents to [facility]: [place] today for follow-up anxiety. Patient has a history of anxiety and panic attacks. She has multiple medication allergies and intolerances. Her anxiety is typically reasonably well controlled with Xanax twice daily. She has had trials of trying to stop this medicine and not done well. She said other medications as well. Most recently she was started on mirtazapine. This she tolerated well however she was not fully controlled. We tried to start Lamictal and she did not tolerated. She mistakenly stopped the mirtazapine when she started Lamictal. She has also stop the Lamictal as she did not tolerate it well. She notes her anxiety is quite severe at times it interferes with her ability to function normally.  She has a follow-up appointment with a psychiatrist next week.  She has a follow-up appointment with me in August 8.   Past Medical History:  Diagnosis Date  . Anxiety   . Arthritis    osteoarthritis  . Asthma   . C. difficile diarrhea   . Candida esophagitis ([place])   . Chronic sinusitis   . Depression   . Depression   . Diverticulosis   . DJD (degenerative joint disease)     L5 compression fracture  . Edema extremities   . Fibromyalgia   . GERD (gastroesophageal reflux disease)   . Gout   . Hyperlipidemia   . Hypertension   . Osteoarthritis   . Osteopenia   . Panic disorder   . Renal failure, unspecified   . Spinal stenosis of lumbar region   . Tubular adenoma of colon   . Wrist fracture, left    Past Surgical History:  Procedure Laterality Date  . ADENOIDECTOMY    . CARPAL TUNNEL RELEASE  2007, 2009   Bilateral  . CATARACT EXTRACTION, BILATERAL    . CHOANAL ADENIODECTOMY    . DIAGNOSTIC LARYNGOSCOPY N/A 11/05/2015   Procedure: DIAGNOSTIC LARYNGOSCOPY AND ESOPHAGOSCOPY;  Surgeon: Rozetta Nunnery, MD;  Location: [facility];  Service: ENT;  Laterality: N/A;  . ganglion cyct removal on fingers     right  . ganglion cyst  wrist    . KNEE ARTHROSCOPY     right  . NASAL SINUS SURGERY    . OPEN REDUCTION INTERNAL FIXATION (ORIF) DISTAL RADIAL FRACTURE Left 12/18/2016   Procedure: OPEN REDUCTION INTERNAL FIXATION (ORIF) DISTAL RADIAL FRACTURE;  Surgeon: Iran Planas, MD;  Location: [facility];  Service: Orthopedics;  Laterality: Left;  . SHOULDER SURGERY  2004, 07/30/10, 01/2011   after her right humeral neck fracture, revision hemiarthroplasty 2004 for persistent pain, revision reverse arthroplasty December 2011 for persistent pain, incision and drainage with poly-exchange 01/26/2011 for possible prosthetic joint infection.  . TONSILLECTOMY AND ADENOIDECTOMY    . TOTAL ABDOMINAL HYSTERECTOMY    . TOTAL HIP ARTHROPLASTY  2004   right  . TOTAL KNEE ARTHROPLASTY  2006   right  . TOTAL SHOULDER REPLACEMENT  2002   right   Social History  Substance Use Topics  . Smoking status: Never Smoker  . Smokeless tobacco: Never Used  . Alcohol use No   family history includes Bladder Cancer in her father; Breast cancer in her unknown relative; Colon polyps in her unknown relative; Depression in her son; Diabetes in her mother; Hypertension in her mother.  ROS as above:  Medications: Current Outpatient Prescriptions  Medication Sig Dispense Refill  . ALPRAZolam Duanne Moron)  0.5 MG tablet Take 1 tablet (0.5 mg total) by mouth 2 (two) times daily as needed for anxiety. 15 tablet 0  . AMBULATORY NON FORMULARY MEDICATION [place].  Dx: Rheumatoid Arthritis.  Use daily as needed for ambulation. 1 Units 0  . amLODipine (NORVASC) 10 MG tablet take 1 tablet by mouth once daily 90 tablet 2  . Calcium Carbonate-Vitamin D (CALCIUM 600+D) 600-400 MG-UNIT per tablet Take 1 tablet by mouth 2 (two) times daily.    . diclofenac sodium (VOLTAREN) 1 % GEL Apply 1 application topically 4 (four) times daily.  0  .  diphenhydrAMINE (BENADRYL) 25 MG tablet Take 25 mg by mouth every 6 (six) hours as needed. For allergies    . EPINEPHrine (ADRENACLICK) 0.3 LK/9.1 mL IJ SOAJ injection Inject 0.3 mLs (0.3 mg total) into the muscle once. 1 Device 1  . fentaNYL (DURAGESIC - DOSED MCG/HR) 12 MCG/HR Place 1 patch (12.5 mcg total) onto the skin every 3 (three) days. 10 patch 0  . hydrochlorothiazide (HYDRODIURIL) 25 MG tablet take 1 tablet by mouth every morning for blood pressure 90 tablet 1  . montelukast (SINGULAIR) 10 MG tablet Take 1 tablet (10 mg total) by mouth at bedtime. 90 tablet 3  . omeprazole (PRILOSEC) 20 MG capsule Take 20 mg by mouth daily.  0  . ondansetron (ZOFRAN) 4 MG tablet Take 1 tablet (4 mg total) by mouth every 8 (eight) hours as needed for nausea or vomiting. 20 tablet 12  . Oxycodone HCl 10 MG TABS Take 1 tablet (10 mg total) by mouth 4 (four) times daily as needed (for pain). 120 tablet 0  . polyethylene glycol (MIRALAX / GLYCOLAX) packet Take 17 g by mouth daily. 14 each 0  . simvastatin (ZOCOR) 10 MG tablet TAKE 1 TABLET BY MOUTH ONCE DAILY 90 tablet 0  . zoledronic acid (RECLAST) 5 MG/100ML SOLN Inject 5 mg into the vein. Once yearly. In June    . mirtazapine (REMERON) 7.5 MG tablet Take 1 tablet (7.5 mg total) by mouth at bedtime. 30 tablet 1  . Potassium Chloride ER 20 MEQ TBCR TAKE 1 TABLET BY MOUTH ONCE DAILY 30 tablet 0  . potassium chloride SA (K-DUR,KLOR-CON) 20 MEQ tablet take 1 tablet by mouth once daily 30 tablet 1   No current facility-administered medications for this visit.    Allergies  Allergen Reactions  . Bee Venom Anaphylaxis    Has epi-pen  . Ceftin [Cefuroxime Axetil] Anaphylaxis  . Ciprofloxacin Anaphylaxis and Palpitations  . Ephedrine Other (See Comments) and Palpitations    "knocks me out"  . Sulfonamide Derivatives Anaphylaxis  . Telithromycin Anaphylaxis    Reaction: also blurred vision   . Valsartan Anaphylaxis  . Verapamil Anaphylaxis  . Venlafaxine  Other (See Comments)    Insomnia, panic  . Beta Adrenergic Blockers     bradycardia  . Cephalosporins     Allergic Reaction  . Clindamycin/Lincomycin     Dry skin and itching/ Cdiff  . Cymbalta [Duloxetine Hcl] Other (See Comments)    Reaction:Confusion and " I fall down"  . Doxazosin     "blurred vision and irritable"  . Gabapentin Other (See Comments)    Reaction: headache  . Hydralazine     HA, Diarrhea  . Lamotrigine     Patient unsure at this time  . Morphine Other (See Comments)    Medication has no effect with pain  . Oxycodone-Acetaminophen Hives    Takes plain oxy IR at home (  May 2018)  . Pregabalin Swelling  . Prochlorperazine Edisylate   . Pseudoephedrine Other (See Comments)    Reaction: hyperventilates and blacks out  . Relafen [Nabumetone] Swelling  . Zoloft [Sertraline] Other (See Comments)    "nervous/jittery"  . Doxycycline Hives    Health Maintenance Health Maintenance  Topic Date Due  . INFLUENZA VACCINE  03/01/2017  . TETANUS/TDAP  02/20/2020  . DEXA SCAN  Completed  . PNA vac Low Risk Adult  Completed     Exam:  BP (!) 142/64   Pulse 94   Ht 5' (1.524 m)   Wt 153 lb 1.9 oz (69.5 kg)   SpO2 97%   BMI 29.90 kg/m  Gen: Well NAD HEENT: EOMI,  MMM Lungs: Normal work of breathing. CTABL Heart: RRR no MRG Abd: NABS, Soft. Nondistended, Nontender Exts: Brisk capillary refill, warm and well perfused.  Psych: Alert and oriented normal speech thought process. Affect is tearful at times. No SI or HI expressed.   No results found for this or any previous visit (from the past 72 hour(s)). No results found.    Assessment and Plan: 83 y.o. female with anxiety. Not well-controlled. Plan to restart mirtazapine and follow-up with psychiatry. I'm happy to continue to prescribe Xanax. Patient and her family understand the risk of this medication especially as she is older and its risk with the opiates that she takes for chronic back pain.    No  orders of the defined types were placed in this encounter.  Meds ordered this encounter  Medications  . mirtazapine (REMERON) 7.5 MG tablet    Sig: Take 1 tablet (7.5 mg total) by mouth at bedtime.    Dispense:  30 tablet    Refill:  1     Discussed warning signs or symptoms. Please see discharge instructions. Patient expresses understanding.

## 2017-02-24 NOTE — Patient Instructions (Addendum)
Thank you for coming in today. STOP Lamictal Restart Mirtazapine at 7.5mg  at bedtime.  Follow up with Psychiatry.  You have an appointment with Me on Aug 8th.   We will continue to follow about your wrist as well.   Try to have your orthopedic doctor send me notes.

## 2017-02-27 ENCOUNTER — Ambulatory Visit (INDEPENDENT_AMBULATORY_CARE_PROVIDER_SITE_OTHER): Payer: Medicare Other | Admitting: Psychiatry

## 2017-02-27 ENCOUNTER — Encounter (HOSPITAL_COMMUNITY): Payer: Self-pay | Admitting: Psychiatry

## 2017-02-27 ENCOUNTER — Ambulatory Visit (HOSPITAL_COMMUNITY): Payer: Self-pay | Admitting: Psychiatry

## 2017-02-27 VITALS — BP 146/68 | HR 103 | Resp 16 | Ht 60.0 in | Wt 152.0 lb

## 2017-02-27 DIAGNOSIS — Z818 Family history of other mental and behavioral disorders: Secondary | ICD-10-CM

## 2017-02-27 DIAGNOSIS — F324 Major depressive disorder, single episode, in partial remission: Secondary | ICD-10-CM

## 2017-02-27 DIAGNOSIS — F411 Generalized anxiety disorder: Secondary | ICD-10-CM | POA: Diagnosis not present

## 2017-02-27 DIAGNOSIS — M1712 Unilateral primary osteoarthritis, left knee: Secondary | ICD-10-CM | POA: Diagnosis not present

## 2017-02-27 DIAGNOSIS — I129 Hypertensive chronic kidney disease with stage 1 through stage 4 chronic kidney disease, or unspecified chronic kidney disease: Secondary | ICD-10-CM | POA: Diagnosis not present

## 2017-02-27 DIAGNOSIS — M48061 Spinal stenosis, lumbar region without neurogenic claudication: Secondary | ICD-10-CM | POA: Diagnosis not present

## 2017-02-27 DIAGNOSIS — F4323 Adjustment disorder with mixed anxiety and depressed mood: Secondary | ICD-10-CM | POA: Diagnosis not present

## 2017-02-27 DIAGNOSIS — M1612 Unilateral primary osteoarthritis, left hip: Secondary | ICD-10-CM | POA: Diagnosis not present

## 2017-02-27 DIAGNOSIS — S52572D Other intraarticular fracture of lower end of left radius, subsequent encounter for closed fracture with routine healing: Secondary | ICD-10-CM | POA: Diagnosis not present

## 2017-02-27 DIAGNOSIS — G894 Chronic pain syndrome: Secondary | ICD-10-CM | POA: Diagnosis not present

## 2017-02-27 NOTE — Progress Notes (Signed)
Psychiatric Initial Adult Assessment   Patient Identification: [name] MRN:  [id number] Date of Evaluation:  02/27/2017 Referral Source: primary care Chief Complaint:   Chief Complaint    Establish Care     Visit Diagnosis:    ICD-10-CM   1. Major depressive disorder with single episode, in partial remission ([place]) F32.4   2. Adjustment disorder with mixed anxiety and depressed mood F43.23     History of Present Illness:  83 years old currently married Caucasian female referred by primary care physician for management of depression  Patient has been seeing a counselor in [place] but because of travel distance she wanted to change. She is getting medication recently started on mirtazapine she does not take in the beginning as she talked it was Lamictal that she was allergic to Lamictal She endorses some difficult time with her husband and arguments with a sleep separately and they have been married for 63 years and this has been affecting her day-to-day mood at times because relationship is not healthy. 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 Finances low saving Modifying factors; her dogs her kids  Severity of depression: 5/10. 10 being no depression   Associated Signs/Symptoms: Depression Symptoms:  fatigue, (Hypo) Manic Symptoms:  Distractibility, Anxiety Symptoms:  Excessive Worry, Psychotic Symptoms:   denies PTSD Symptoms: NA  Past Psychiatric History: depression. When her 4 th kid was born. She had to be hospitalized later for depression  Previous Psychotropic Medications: Yes   Substance Abuse History in the last 12 months:  No.  Consequences of Substance Abuse: NA  Past Medical History:  Past Medical History:  Diagnosis Date  . Anxiety   . Arthritis    osteoarthritis  . Asthma   . C. difficile diarrhea   . Candida esophagitis ([place])   . Chronic sinusitis   . Depression   . Depression   . Diverticulosis   . DJD (degenerative joint disease)     L5 compression fracture  . Edema extremities   . Fibromyalgia   . GERD (gastroesophageal reflux disease)   . Gout   . Hyperlipidemia   . Hypertension   . Osteoarthritis   . Osteopenia   . Panic disorder   . Renal failure, unspecified   . Spinal stenosis of lumbar region   . Tubular adenoma of colon   . Wrist fracture, left     Past Surgical History:  Procedure Laterality Date  . ADENOIDECTOMY    . CARPAL TUNNEL RELEASE  2007, 2009   Bilateral  . CATARACT EXTRACTION, BILATERAL    . CHOANAL ADENIODECTOMY    . DIAGNOSTIC LARYNGOSCOPY N/A 11/05/2015   Procedure: DIAGNOSTIC LARYNGOSCOPY AND ESOPHAGOSCOPY;  Surgeon: Rozetta Nunnery, MD;  Location: [facility];  Service: ENT;  Laterality: N/A;  . ganglion cyct removal on fingers     right  . ganglion cyst  wrist    .  KNEE ARTHROSCOPY     right  . NASAL SINUS SURGERY    . OPEN REDUCTION INTERNAL FIXATION (ORIF) DISTAL RADIAL FRACTURE Left 12/18/2016   Procedure: OPEN REDUCTION INTERNAL FIXATION (ORIF) DISTAL RADIAL FRACTURE;  Surgeon: Iran Planas, MD;  Location: [facility];  Service: Orthopedics;  Laterality: Left;  . SHOULDER SURGERY  2004, 07/30/10, 01/2011   after her right humeral neck fracture, revision hemiarthroplasty 2004 for persistent pain, revision reverse arthroplasty December 2011 for persistent pain, incision and drainage with poly-exchange 01/26/2011 for possible  prosthetic joint infection.  . TONSILLECTOMY AND ADENOIDECTOMY    . TOTAL ABDOMINAL HYSTERECTOMY    . TOTAL HIP ARTHROPLASTY  2004   right  . TOTAL KNEE ARTHROPLASTY  2006   right  . TOTAL SHOULDER REPLACEMENT  2002   right    Family Psychiatric History: denies. But believe her mom may have had depression. Her son  Family History:  Family History  Problem Relation Age of Onset  . Bladder Cancer Father   . Diabetes Mother   . Hypertension Mother   . Breast cancer Unknown   . Colon polyps Unknown   . Depression Son     Social History:   Social History   Social History  . Marital status: Married    Spouse name: N/A  . Number of children: N/A  . Years of education: N/A   Social History Main Topics  . Smoking status: Never Smoker  . Smokeless tobacco: Never Used  . Alcohol use No  . Drug use: No  . Sexual activity: No   Other Topics Concern  . None   Social History Narrative  . None    Additional Social History: Grew up with parents in a somewhat chaotic they will argue a lot. Her dad died when patient was in high school because of cancer that let her mom to get more depressed. 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 Bee Venom Anaphylaxis    Has epi-pen  . Ceftin [Cefuroxime Axetil] Anaphylaxis  . Ciprofloxacin Anaphylaxis and Palpitations  . Ephedrine Other (See Comments) and Palpitations    "knocks me out"  . Sulfonamide Derivatives Anaphylaxis  . Telithromycin Anaphylaxis    Reaction: also blurred vision   . Valsartan Anaphylaxis  . Verapamil Anaphylaxis  . Venlafaxine Other (See Comments)    Insomnia, panic  . Beta Adrenergic Blockers     bradycardia  . Cephalosporins     Allergic Reaction  . Clindamycin/Lincomycin     Dry skin and itching/ Cdiff  . Cymbalta [Duloxetine Hcl] Other (See Comments)    Reaction:Confusion and  " I fall down"  . Doxazosin     "blurred vision and irritable"  . Gabapentin Other (See Comments)    Reaction: headache  . Hydralazine     HA, Diarrhea  . Lamotrigine     Patient unsure at this time  . Morphine Other (See Comments)    Medication has no effect with pain  . Oxycodone-Acetaminophen Hives    Takes plain oxy IR at home (May 2018)  . Pregabalin Swelling  . Prochlorperazine Edisylate   . Pseudoephedrine Other (See Comments)    Reaction: hyperventilates and blacks out  . Relafen [Nabumetone] Swelling  . Zoloft [Sertraline] Other (See Comments)    "nervous/jittery"  . Doxycycline Hives    Metabolic Disorder Labs:  Lab Results  Component Value Date   HGBA1C 6.1 (H) 02/10/2016   MPG 128 02/10/2016   MPG 131 (H) 05/26/2015   No results found for: PROLACTIN Lab Results  Component Value Date   CHOL 130 11/24/2015   TRIG 191 (H) 11/24/2015   HDL 29 (L) 11/24/2015   CHOLHDL 4.5 11/24/2015   VLDL 38 (H) 11/24/2015   LDLCALC 63 11/24/2015   LDLCALC 96 09/04/2014     Current Medications: Current Outpatient Prescriptions  Medication Sig Dispense Refill  . ALPRAZolam (XANAX) 0.5 MG tablet Take 1 tablet (0.5 mg total) by mouth 2 (two) times daily as needed for anxiety. 15 tablet 0  . AMBULATORY NON FORMULARY MEDICATION [place].  Dx: Rheumatoid Arthritis.  Use daily as needed for ambulation. 1 Units 0  . amLODipine (NORVASC) 10 MG tablet take 1 tablet by mouth once daily 90 tablet 2  . Calcium Carbonate-Vitamin D (CALCIUM 600+D) 600-400 MG-UNIT per tablet Take 1 tablet by mouth 2 (two) times daily.    . diclofenac sodium (VOLTAREN) 1 % GEL Apply 1 application topically 4 (four) times daily.  0  . diphenhydrAMINE (BENADRYL) 25 MG tablet Take 25 mg by mouth every 6 (six) hours as needed. For allergies    . EPINEPHrine (ADRENACLICK) 0.3 XH/3.7 mL IJ SOAJ injection Inject 0.3 mLs (0.3 mg total) into the muscle once. 1 Device 1  . fentaNYL (DURAGESIC - DOSED MCG/HR) 12  MCG/HR Place 1 patch (12.5 mcg total) onto the skin every 3 (three) days. 10 patch 0  . hydrochlorothiazide (HYDRODIURIL) 25 MG tablet take 1 tablet by mouth every morning for blood pressure 90 tablet 1  . mirtazapine (REMERON) 7.5 MG tablet Take 1 tablet (7.5 mg total) by mouth at bedtime. 30 tablet 1  . montelukast (SINGULAIR) 10 MG tablet Take 1 tablet (10 mg total) by mouth at bedtime. 90 tablet 3  . omeprazole (PRILOSEC) 20 MG capsule Take 20 mg by mouth daily.  0  . ondansetron (ZOFRAN) 4 MG tablet Take 1 tablet (4 mg total) by mouth every 8 (eight) hours as needed for nausea or vomiting. 20 tablet 12  . Oxycodone HCl 10 MG TABS Take 1 tablet (10 mg total) by mouth 4 (four) times daily as needed (for pain). 120 tablet 0  . polyethylene glycol (MIRALAX / GLYCOLAX) packet Take 17 g by mouth daily. 14 each 0  . Potassium Chloride ER 20 MEQ TBCR TAKE 1 TABLET BY MOUTH ONCE DAILY 30 tablet 0  . potassium chloride SA (K-DUR,KLOR-CON) 20 MEQ tablet take 1 tablet by mouth once daily 30 tablet 1  . simvastatin (ZOCOR) 10 MG tablet TAKE 1 TABLET BY MOUTH ONCE DAILY 90 tablet 0  . zoledronic acid (RECLAST) 5 MG/100ML SOLN Inject 5 mg into the vein. Once yearly. In June     No current facility-administered medications for this visit.     Neurologic: Headache: No Seizure: No Paresthesias:at times  Musculoskeletal: Strength & Muscle Tone: decreased Gait & Station: unsteady Patient leans: Front  Psychiatric Specialty Exam: Review of Systems  Cardiovascular: Negative for chest pain.  Skin: Negative for rash.  Psychiatric/Behavioral: Negative for suicidal ideas.    Blood pressure (!) 146/68, pulse (!) 103, resp. rate 16, height 5' (1.524 m), weight 152 lb (68.9 kg), SpO2 96 %.Body mass index is 29.69 kg/m.  General Appearance: Casual  Eye Contact:  Fair  Speech:  Normal Rate  Volume:  Decreased  Mood:  somewhat sad  Affect:  Congruent  Thought Process:  Goal Directed  Orientation:  Full  (Time, Place, and Person)  Thought Content:  Rumination  Suicidal Thoughts:  No  Homicidal Thoughts:  No  Memory:  Immediate;   Fair Recent;   Fair  Judgement:  Fair  Insight:  Fair  Psychomotor Activity:  Decreased  Concentration:  Concentration: Fair and Attention Span: Fair  Recall:  [employer] of Knowledge:Fair  Language: Fair  Akathisia:  Negative  Handed:  Right  AIMS (if indicated):    Assets:  Desire for Improvement  ADL's:  Intact  Cognition: WNL  Sleep:  fair    Treatment Plan Summary: Medication management and Plan as follows  1. Major depression recurrent,mild: she has started on mirtazapine and this helped her sleep some and also she was to get more time she can increase it to 2 tablets at night discussed interview side effects. She is sensitive to medications because of her long list of allergies. We will keep things simple talked about having positive energy and positive activities.  Generalized anxiety disorder; continue to keep busy as much as she can and also to distract her worries discussed techniques also start Remeron that'll help sleep as well. She takes Xanax when necessary but not on a regular basis if needed we can consider buspirone  More than 50% time spent in counseling and coordination of care including patient education. We'll keep things simple as she is on multiple medications and has multiple allergies will review mirtazapine dose adjustment if needed next visit cautioned not to take excessive Xanax or caffeine  FU 4 weeks, has refill on mirtazepine. She is at present not interested for therapy    Merian Capron, MD 7/30/201811:24 AM

## 2017-02-28 DIAGNOSIS — S52572D Other intraarticular fracture of lower end of left radius, subsequent encounter for closed fracture with routine healing: Secondary | ICD-10-CM | POA: Diagnosis not present

## 2017-02-28 DIAGNOSIS — M48061 Spinal stenosis, lumbar region without neurogenic claudication: Secondary | ICD-10-CM | POA: Diagnosis not present

## 2017-02-28 DIAGNOSIS — M1712 Unilateral primary osteoarthritis, left knee: Secondary | ICD-10-CM | POA: Diagnosis not present

## 2017-02-28 DIAGNOSIS — M1612 Unilateral primary osteoarthritis, left hip: Secondary | ICD-10-CM | POA: Diagnosis not present

## 2017-02-28 DIAGNOSIS — I129 Hypertensive chronic kidney disease with stage 1 through stage 4 chronic kidney disease, or unspecified chronic kidney disease: Secondary | ICD-10-CM | POA: Diagnosis not present

## 2017-02-28 DIAGNOSIS — G894 Chronic pain syndrome: Secondary | ICD-10-CM | POA: Diagnosis not present

## 2017-02-28 MED ORDER — MIRTAZAPINE 7.5 MG PO TABS: 7.5000 mg | ORAL_TABLET | Freq: Every day | ORAL | 1 refills | Status: DC

## 2017-03-01 DIAGNOSIS — M1712 Unilateral primary osteoarthritis, left knee: Secondary | ICD-10-CM | POA: Diagnosis not present

## 2017-03-01 DIAGNOSIS — I129 Hypertensive chronic kidney disease with stage 1 through stage 4 chronic kidney disease, or unspecified chronic kidney disease: Secondary | ICD-10-CM | POA: Diagnosis not present

## 2017-03-01 DIAGNOSIS — M48061 Spinal stenosis, lumbar region without neurogenic claudication: Secondary | ICD-10-CM | POA: Diagnosis not present

## 2017-03-01 DIAGNOSIS — S52572D Other intraarticular fracture of lower end of left radius, subsequent encounter for closed fracture with routine healing: Secondary | ICD-10-CM | POA: Diagnosis not present

## 2017-03-01 DIAGNOSIS — G894 Chronic pain syndrome: Secondary | ICD-10-CM | POA: Diagnosis not present

## 2017-03-01 DIAGNOSIS — M1612 Unilateral primary osteoarthritis, left hip: Secondary | ICD-10-CM | POA: Diagnosis not present

## 2017-03-02 DIAGNOSIS — M1612 Unilateral primary osteoarthritis, left hip: Secondary | ICD-10-CM | POA: Diagnosis not present

## 2017-03-02 DIAGNOSIS — G894 Chronic pain syndrome: Secondary | ICD-10-CM | POA: Diagnosis not present

## 2017-03-02 DIAGNOSIS — M48061 Spinal stenosis, lumbar region without neurogenic claudication: Secondary | ICD-10-CM | POA: Diagnosis not present

## 2017-03-02 DIAGNOSIS — I129 Hypertensive chronic kidney disease with stage 1 through stage 4 chronic kidney disease, or unspecified chronic kidney disease: Secondary | ICD-10-CM | POA: Diagnosis not present

## 2017-03-02 DIAGNOSIS — M1712 Unilateral primary osteoarthritis, left knee: Secondary | ICD-10-CM | POA: Diagnosis not present

## 2017-03-02 DIAGNOSIS — S52572D Other intraarticular fracture of lower end of left radius, subsequent encounter for closed fracture with routine healing: Secondary | ICD-10-CM | POA: Diagnosis not present

## 2017-03-06 DIAGNOSIS — M1612 Unilateral primary osteoarthritis, left hip: Secondary | ICD-10-CM | POA: Diagnosis not present

## 2017-03-06 DIAGNOSIS — M48061 Spinal stenosis, lumbar region without neurogenic claudication: Secondary | ICD-10-CM | POA: Diagnosis not present

## 2017-03-06 DIAGNOSIS — I129 Hypertensive chronic kidney disease with stage 1 through stage 4 chronic kidney disease, or unspecified chronic kidney disease: Secondary | ICD-10-CM | POA: Diagnosis not present

## 2017-03-06 DIAGNOSIS — G894 Chronic pain syndrome: Secondary | ICD-10-CM | POA: Diagnosis not present

## 2017-03-06 DIAGNOSIS — M1712 Unilateral primary osteoarthritis, left knee: Secondary | ICD-10-CM | POA: Diagnosis not present

## 2017-03-06 DIAGNOSIS — S52572D Other intraarticular fracture of lower end of left radius, subsequent encounter for closed fracture with routine healing: Secondary | ICD-10-CM | POA: Diagnosis not present

## 2017-03-08 ENCOUNTER — Other Ambulatory Visit: Payer: Self-pay

## 2017-03-08 ENCOUNTER — Ambulatory Visit: Payer: Self-pay | Admitting: Family Medicine

## 2017-03-08 DIAGNOSIS — M1712 Unilateral primary osteoarthritis, left knee: Secondary | ICD-10-CM | POA: Diagnosis not present

## 2017-03-08 DIAGNOSIS — M48061 Spinal stenosis, lumbar region without neurogenic claudication: Secondary | ICD-10-CM | POA: Diagnosis not present

## 2017-03-08 DIAGNOSIS — I129 Hypertensive chronic kidney disease with stage 1 through stage 4 chronic kidney disease, or unspecified chronic kidney disease: Secondary | ICD-10-CM | POA: Diagnosis not present

## 2017-03-08 DIAGNOSIS — M1612 Unilateral primary osteoarthritis, left hip: Secondary | ICD-10-CM | POA: Diagnosis not present

## 2017-03-08 DIAGNOSIS — S52572D Other intraarticular fracture of lower end of left radius, subsequent encounter for closed fracture with routine healing: Secondary | ICD-10-CM | POA: Diagnosis not present

## 2017-03-08 DIAGNOSIS — G894 Chronic pain syndrome: Secondary | ICD-10-CM | POA: Diagnosis not present

## 2017-03-08 NOTE — Patient Outreach (Signed)
[facility]) Care Management  03/08/2017  [name] 11-20-1932 [id number]   Transition of care:  Hospital course 5/19/ 18 to 12/20/16 Skilled nursing facility 12/20/16 to 12/30/16 Status post left wrist fracture, ORIF of left wrist   Telephone outreach to patient for transition of care follow up. HIPAA compliant voice message left with call back phone number.   PLAN: RNCM will attempt telephone outreach to patient within 2 weeks.    Quinn Plowman RN,BSN,CCM [facility] Telephonic  [phone]

## 2017-03-10 DIAGNOSIS — M1612 Unilateral primary osteoarthritis, left hip: Secondary | ICD-10-CM | POA: Diagnosis not present

## 2017-03-10 DIAGNOSIS — M48061 Spinal stenosis, lumbar region without neurogenic claudication: Secondary | ICD-10-CM | POA: Diagnosis not present

## 2017-03-10 DIAGNOSIS — M1712 Unilateral primary osteoarthritis, left knee: Secondary | ICD-10-CM | POA: Diagnosis not present

## 2017-03-10 DIAGNOSIS — I129 Hypertensive chronic kidney disease with stage 1 through stage 4 chronic kidney disease, or unspecified chronic kidney disease: Secondary | ICD-10-CM | POA: Diagnosis not present

## 2017-03-10 DIAGNOSIS — S52572D Other intraarticular fracture of lower end of left radius, subsequent encounter for closed fracture with routine healing: Secondary | ICD-10-CM | POA: Diagnosis not present

## 2017-03-10 DIAGNOSIS — G894 Chronic pain syndrome: Secondary | ICD-10-CM | POA: Diagnosis not present

## 2017-03-13 ENCOUNTER — Ambulatory Visit (INDEPENDENT_AMBULATORY_CARE_PROVIDER_SITE_OTHER): Payer: Medicare Other | Admitting: Family Medicine

## 2017-03-13 ENCOUNTER — Encounter: Payer: Self-pay | Admitting: Family Medicine

## 2017-03-13 VITALS — BP 127/72 | HR 93 | Wt 155.0 lb

## 2017-03-13 DIAGNOSIS — F329 Major depressive disorder, single episode, unspecified: Secondary | ICD-10-CM | POA: Diagnosis not present

## 2017-03-13 DIAGNOSIS — G894 Chronic pain syndrome: Secondary | ICD-10-CM | POA: Diagnosis not present

## 2017-03-13 DIAGNOSIS — F419 Anxiety disorder, unspecified: Secondary | ICD-10-CM | POA: Diagnosis not present

## 2017-03-13 DIAGNOSIS — S62102D Fracture of unspecified carpal bone, left wrist, subsequent encounter for fracture with routine healing: Secondary | ICD-10-CM

## 2017-03-13 MED ORDER — MIRTAZAPINE 15 MG PO TABS
15.0000 mg | ORAL_TABLET | Freq: Every day | ORAL | 1 refills | Status: DC
Start: 2017-03-13 — End: 2017-03-22

## 2017-03-13 MED ORDER — FENTANYL 12 MCG/HR TD PT72: 12.5000 ug | MEDICATED_PATCH | TRANSDERMAL | 0 refills | Status: DC

## 2017-03-13 MED ORDER — OXYCODONE HCL 10 MG PO TABS: 10.0000 mg | ORAL_TABLET | Freq: Four times a day (QID) | ORAL | 0 refills | Status: DC | PRN

## 2017-03-13 NOTE — Patient Instructions (Signed)
Thank you for coming in today. Please recheck in about 1 month.  Continue pain medications.  Use the brace as needed.   Increase Mirtazapine at night.  Increase to 15mg  at night.   Return sooner if needed .

## 2017-03-13 NOTE — Progress Notes (Signed)
[name] is a 83 y.o. female who presents to [facility] today for follow-up anxiety and depression. Patient is doing better than last month. She currently takes 7.5 mg of mirtazapine at night. She's been seen by her psychiatrist who agrees to continue the regimen. She uses Xanax intermittently.  Chronic pain: Patient continues oxycodone and fentanyl for chronic pain management. She notes continued back pain but overall is doing well.  Left wrist fracture status post ORIF managed with hand surgery. Doing reasonably well with intermittent bracing. Patient continues occupational therapy.   Past Medical History:  Diagnosis Date  . Anxiety   . Arthritis    osteoarthritis  . Asthma   . C. difficile diarrhea   . Candida esophagitis ([place])   . Chronic sinusitis   . Depression   . Depression   . Diverticulosis   . DJD (degenerative joint disease)     L5 compression fracture  . Edema extremities   . Fibromyalgia   . GERD (gastroesophageal reflux disease)   . Gout   . Hyperlipidemia   . Hypertension   . Osteoarthritis   . Osteopenia   . Panic disorder   . Renal failure, unspecified   . Spinal stenosis of lumbar region   . Tubular adenoma of colon   . Wrist fracture, left    Past Surgical History:  Procedure Laterality Date  . ADENOIDECTOMY    . CARPAL TUNNEL RELEASE  2007, 2009   Bilateral  . CATARACT EXTRACTION, BILATERAL    . CHOANAL ADENIODECTOMY    . DIAGNOSTIC LARYNGOSCOPY N/A 11/05/2015   Procedure: DIAGNOSTIC LARYNGOSCOPY AND ESOPHAGOSCOPY;  Surgeon: Rozetta Nunnery, MD;  Location[facility];  Service: ENT;  Laterality: N/A;  . ganglion cyct removal on fingers     right  . ganglion cyst  wrist    . KNEE ARTHROSCOPY     right  . NASAL SINUS SURGERY    . OPEN REDUCTION INTERNAL FIXATION (ORIF) DISTAL RADIAL FRACTURE Left 12/18/2016   Procedure: OPEN REDUCTION INTERNAL FIXATION (ORIF) DISTAL RADIAL FRACTURE;  Surgeon: Iran Planas, MD;  Location: [facility];  Service: Orthopedics;  Laterality: Left;  . SHOULDER SURGERY  2004, 07/30/10, 01/2011   after her right humeral neck fracture, revision hemiarthroplasty 2004 for persistent pain, revision reverse arthroplasty December 2011 for persistent pain, incision and drainage with poly-exchange 01/26/2011 for possible prosthetic joint infection.  . TONSILLECTOMY AND ADENOIDECTOMY    . TOTAL ABDOMINAL HYSTERECTOMY    . TOTAL HIP ARTHROPLASTY  2004   right  . TOTAL KNEE ARTHROPLASTY  2006   right  . TOTAL SHOULDER REPLACEMENT  2002   right   Social History  Substance Use Topics  . Smoking status: Never Smoker  . Smokeless tobacco: Never Used  . Alcohol use No   family history includes Bladder Cancer in her father; Breast cancer in her unknown relative; Colon polyps in her unknown relative; Depression in her son; Diabetes in her mother; Hypertension in her mother.  ROS as above:  Medications: Current Outpatient Prescriptions  Medication Sig Dispense Refill  . ALPRAZolam (XANAX) 0.5 MG tablet Take 1 tablet (0.5 mg total) by mouth 2 (two) times daily as needed for anxiety. 15 tablet 0  . AMBULATORY NON FORMULARY MEDICATION [place].  Dx: Rheumatoid Arthritis.  Use daily as needed for ambulation. 1 Units 0  . amLODipine (NORVASC) 10 MG tablet take 1 tablet by mouth once daily 90  tablet 2  . Calcium Carbonate-Vitamin D (CALCIUM 600+D) 600-400 MG-UNIT per tablet Take 1 tablet by mouth 2 (two) times daily.    . diclofenac sodium (VOLTAREN) 1 % GEL Apply 1 application topically 4 (four) times daily.  0  . diphenhydrAMINE (BENADRYL) 25 MG tablet Take 25 mg by mouth every 6 (six) hours as needed. For allergies    . EPINEPHrine (ADRENACLICK) 0.3 BT/5.1 mL IJ SOAJ injection Inject 0.3 mLs (0.3 mg total) into the muscle once. 1 Device 1  . fentaNYL (DURAGESIC - DOSED MCG/HR) 12 MCG/HR  Place 1 patch (12.5 mcg total) onto the skin every 3 (three) days. 10 patch 0  . hydrochlorothiazide (HYDRODIURIL) 25 MG tablet take 1 tablet by mouth every morning for blood pressure 90 tablet 1  . mirtazapine (REMERON) 15 MG tablet Take 1 tablet (15 mg total) by mouth at bedtime. 30 tablet 1  . montelukast (SINGULAIR) 10 MG tablet Take 1 tablet (10 mg total) by mouth at bedtime. 90 tablet 3  . omeprazole (PRILOSEC) 20 MG capsule Take 20 mg by mouth daily.  0  . ondansetron (ZOFRAN) 4 MG tablet Take 1 tablet (4 mg total) by mouth every 8 (eight) hours as needed for nausea or vomiting. 20 tablet 12  . Oxycodone HCl 10 MG TABS Take 1 tablet (10 mg total) by mouth 4 (four) times daily as needed (for pain). 120 tablet 0  . polyethylene glycol (MIRALAX / GLYCOLAX) packet Take 17 g by mouth daily. 14 each 0  . Potassium Chloride ER 20 MEQ TBCR TAKE 1 TABLET BY MOUTH ONCE DAILY 30 tablet 0  . potassium chloride SA (K-DUR,KLOR-CON) 20 MEQ tablet take 1 tablet by mouth once daily 30 tablet 1  . simvastatin (ZOCOR) 10 MG tablet TAKE 1 TABLET BY MOUTH ONCE DAILY 90 tablet 0  . zoledronic acid (RECLAST) 5 MG/100ML SOLN Inject 5 mg into the vein. Once yearly. In June     No current facility-administered medications for this visit.    Allergies  Allergen Reactions  . Bee Venom Anaphylaxis    Has epi-pen  . Ceftin [Cefuroxime Axetil] Anaphylaxis  . Ciprofloxacin Anaphylaxis and Palpitations  . Ephedrine Other (See Comments) and Palpitations    "knocks me out"  . Sulfonamide Derivatives Anaphylaxis  . Telithromycin Anaphylaxis    Reaction: also blurred vision   . Valsartan Anaphylaxis  . Verapamil Anaphylaxis  . Venlafaxine Other (See Comments)    Insomnia, panic  . Beta Adrenergic Blockers     bradycardia  . Cephalosporins     Allergic Reaction  . Clindamycin/Lincomycin     Dry skin and itching/ Cdiff  . Cymbalta [Duloxetine Hcl] Other (See Comments)    Reaction:Confusion and " I fall down"    . Doxazosin     "blurred vision and irritable"  . Gabapentin Other (See Comments)    Reaction: headache  . Hydralazine     HA, Diarrhea  . Lamotrigine     Patient unsure at this time  . Morphine Other (See Comments)    Medication has no effect with pain  . Oxycodone-Acetaminophen Hives    Takes plain oxy IR at home (May 2018)  . Pregabalin Swelling  . Prochlorperazine Edisylate   . Pseudoephedrine Other (See Comments)    Reaction: hyperventilates and blacks out  . Relafen [Nabumetone] Swelling  . Zoloft [Sertraline] Other (See Comments)    "nervous/jittery"  . Doxycycline Hives    Health Maintenance Health Maintenance  Topic Date Due  .  INFLUENZA VACCINE  03/01/2017  . TETANUS/TDAP  02/20/2020  . DEXA SCAN  Completed  . PNA vac Low Risk Adult  Completed     Exam:  BP 127/72   Pulse 93   Wt 155 lb (70.3 kg)   SpO2 93%   BMI 30.27 kg/m  Gen: Well NAD HEENT: EOMI,  MMM Lungs: Normal work of breathing. CTABL Heart: RRR no MRG Abd: NABS, Soft. Nondistended, Nontender Exts: Brisk capillary refill, warm and well perfused.  Left wrists well-appearing scar no effusion mildly tender decreased motion due to pain.   Depression screen [facility] 2/9 03/13/2017 02/27/2017 02/15/2017 02/09/2017 01/26/2017  Decreased Interest 2 3 3 3 1   Down, Depressed, Hopeless 2 3 3 3 2   PHQ - 2 Score 4 6 6 6 3   Altered sleeping 0 2 3 3 3   Tired, decreased energy 2 3 3 3 3   Change in appetite 3 1 3 1 1   Feeling bad or failure about yourself  2 3 3 3 1   Trouble concentrating 2 3 3 3 2   Moving slowly or fidgety/restless 0 0 0 0 0  Suicidal thoughts 1 1 3 1  0  PHQ-9 Score 14 19 24 20 13   Difficult doing work/chores - - - - -  Some recent data might be hidden    GAD 7 : Generalized Anxiety Score 03/13/2017 02/27/2017 02/15/2017 02/09/2017  Nervous, Anxious, on Edge 3 3 3 3   Control/stop worrying 3 3 3 3   Worry too much - different things 3 3 3 3   Trouble relaxing 3 3 3 3   Restless 2 0 0 0   Easily annoyed or irritable 2 1 3 2   Afraid - awful might happen 0 3 2 2   Total GAD 7 Score 16 16 17 16   Anxiety Difficulty - Somewhat difficult - -       No results found for this or any previous visit (from the past 72 hour(s)). No results found.    Assessment and Plan: 83 y.o. female with  Anxiety and depression improved continue mirtazapine. Increase to 15 mg daily at bedtime follow-up with psychiatry. Recheck with me in one month.  Chronic pain doing well continue chronic pain management. Recheck 1 month  Wrist pain: Doing reasonably well. Continue comanagement with hand surgery.   No orders of the defined types were placed in this encounter.  Meds ordered this encounter  Medications  . mirtazapine (REMERON) 15 MG tablet    Sig: Take 1 tablet (15 mg total) by mouth at bedtime.    Dispense:  30 tablet    Refill:  1  . fentaNYL (DURAGESIC - DOSED MCG/HR) 12 MCG/HR    Sig: Place 1 patch (12.5 mcg total) onto the skin every 3 (three) days.    Dispense:  10 patch    Refill:  0    Fill on or after Aug 22  . Oxycodone HCl 10 MG TABS    Sig: Take 1 tablet (10 mg total) by mouth 4 (four) times daily as needed (for pain).    Dispense:  120 tablet    Refill:  0     Discussed warning signs or symptoms. Please see discharge instructions. Patient expresses understanding.  I spent 25 minutes with this patient, greater than 50% was face-to-face time counseling regarding the above diagnosis.

## 2017-03-14 ENCOUNTER — Other Ambulatory Visit: Payer: Self-pay

## 2017-03-14 DIAGNOSIS — S52572D Other intraarticular fracture of lower end of left radius, subsequent encounter for closed fracture with routine healing: Secondary | ICD-10-CM | POA: Diagnosis not present

## 2017-03-14 DIAGNOSIS — M1612 Unilateral primary osteoarthritis, left hip: Secondary | ICD-10-CM | POA: Diagnosis not present

## 2017-03-14 DIAGNOSIS — I129 Hypertensive chronic kidney disease with stage 1 through stage 4 chronic kidney disease, or unspecified chronic kidney disease: Secondary | ICD-10-CM | POA: Diagnosis not present

## 2017-03-14 DIAGNOSIS — G894 Chronic pain syndrome: Secondary | ICD-10-CM | POA: Diagnosis not present

## 2017-03-14 DIAGNOSIS — M48061 Spinal stenosis, lumbar region without neurogenic claudication: Secondary | ICD-10-CM | POA: Diagnosis not present

## 2017-03-14 DIAGNOSIS — M1712 Unilateral primary osteoarthritis, left knee: Secondary | ICD-10-CM | POA: Diagnosis not present

## 2017-03-14 NOTE — Patient Outreach (Signed)
[facility]) Care Management  03/14/2017  MILLEY VINING [birth year] [id number]  Telephone assessment:  Hospital course 5/19/ 18 to 12/20/16 Skilled nursing facility 12/20/16 to 12/30/16 Status post left wrist fracture, ORIF of left wrist Orthopedic specialist: Dr. Apolonio Schneiders Primary MD: Dr. Georgina Snell  Telephone call to patient regarding telephone assessment. HIPAA verified with patient.  Patient states she is doing fair.  Patient states she has been receiving meals on wheels program. Patient states she has not been very impressed with the meals. Patient states she does not eat chicken. States she is unable to specify this with the Meals on Wheels program.  Patient states she has two daughters that help occasionally by providing her meals.  Patient states she saw her primary MD on yesterday. Patient states her doctor changed her antidepressant medication. Patient denies any other changes in her treatment plan. Patient states her wrist continues to heal. Patient reports she still has soreness and pain in her wrist at time. Patient states she wears her brace for her wrist as needed per her orthopedic doctor instruction. Patient states she saw her orthopedic doctor approximately 1 week ago. States the orthopedic doctor would not refer her to her primary doctor to do follow up regarding her wrist. Patient states her next follow up with the orthopedist is April 17, 2017. Patient states she is still having therapy 2 times per week for her wrist.  Patient states she feels bad because she is unable to get out much. Patient states her daughters are not able to get her out because they are busy with their own lives.  RNCM suggested the senior center/ activities. Patient states she has gone to the senior center and she didn't find anything that she enjoyed doing there.   Patient states she has a new psychiatrist in [place], [place].  Patient states she is happy that she does not have to go so far to  see her psychiatrist.  [facility] discussed with patient telephone follow up after next orthopedic visit. Patient verbalized agreement.   ASSESSMENT: Patient self managing care, keeping follow up appointments, and reports taking medications as prescribed.   PLAN:   RNCM will follow up with patient within the month of September. 2018.   Quinn Plowman RN,BSN,CCM [facility] Telephonic  [phone]

## 2017-03-16 DIAGNOSIS — I129 Hypertensive chronic kidney disease with stage 1 through stage 4 chronic kidney disease, or unspecified chronic kidney disease: Secondary | ICD-10-CM | POA: Diagnosis not present

## 2017-03-16 DIAGNOSIS — G894 Chronic pain syndrome: Secondary | ICD-10-CM | POA: Diagnosis not present

## 2017-03-16 DIAGNOSIS — S52572D Other intraarticular fracture of lower end of left radius, subsequent encounter for closed fracture with routine healing: Secondary | ICD-10-CM | POA: Diagnosis not present

## 2017-03-16 DIAGNOSIS — M48061 Spinal stenosis, lumbar region without neurogenic claudication: Secondary | ICD-10-CM | POA: Diagnosis not present

## 2017-03-16 DIAGNOSIS — M1612 Unilateral primary osteoarthritis, left hip: Secondary | ICD-10-CM | POA: Diagnosis not present

## 2017-03-16 DIAGNOSIS — M1712 Unilateral primary osteoarthritis, left knee: Secondary | ICD-10-CM | POA: Diagnosis not present

## 2017-03-21 ENCOUNTER — Other Ambulatory Visit: Payer: Self-pay

## 2017-03-21 DIAGNOSIS — S52572D Other intraarticular fracture of lower end of left radius, subsequent encounter for closed fracture with routine healing: Secondary | ICD-10-CM | POA: Diagnosis not present

## 2017-03-21 DIAGNOSIS — M48061 Spinal stenosis, lumbar region without neurogenic claudication: Secondary | ICD-10-CM | POA: Diagnosis not present

## 2017-03-21 DIAGNOSIS — I129 Hypertensive chronic kidney disease with stage 1 through stage 4 chronic kidney disease, or unspecified chronic kidney disease: Secondary | ICD-10-CM | POA: Diagnosis not present

## 2017-03-21 DIAGNOSIS — M1712 Unilateral primary osteoarthritis, left knee: Secondary | ICD-10-CM | POA: Diagnosis not present

## 2017-03-21 DIAGNOSIS — M1612 Unilateral primary osteoarthritis, left hip: Secondary | ICD-10-CM | POA: Diagnosis not present

## 2017-03-21 DIAGNOSIS — G894 Chronic pain syndrome: Secondary | ICD-10-CM | POA: Diagnosis not present

## 2017-03-21 MED ORDER — POTASSIUM CHLORIDE CRYS ER 20 MEQ PO TBCR: 20.0000 meq | EXTENDED_RELEASE_TABLET | Freq: Every day | ORAL | 1 refills | Status: DC

## 2017-03-21 NOTE — Progress Notes (Signed)
Refilled

## 2017-03-21 NOTE — Progress Notes (Signed)
Pt is requesting a refill on historical medications. Please refill if appropriate.

## 2017-03-22 ENCOUNTER — Other Ambulatory Visit: Payer: Self-pay

## 2017-03-22 ENCOUNTER — Ambulatory Visit (INDEPENDENT_AMBULATORY_CARE_PROVIDER_SITE_OTHER): Payer: Medicare Other | Admitting: Family Medicine

## 2017-03-22 ENCOUNTER — Encounter: Payer: Self-pay | Admitting: Family Medicine

## 2017-03-22 VITALS — BP 131/76 | HR 111

## 2017-03-22 DIAGNOSIS — Z23 Encounter for immunization: Secondary | ICD-10-CM

## 2017-03-22 DIAGNOSIS — F329 Major depressive disorder, single episode, unspecified: Secondary | ICD-10-CM

## 2017-03-22 DIAGNOSIS — F324 Major depressive disorder, single episode, in partial remission: Secondary | ICD-10-CM | POA: Diagnosis not present

## 2017-03-22 DIAGNOSIS — M546 Pain in thoracic spine: Secondary | ICD-10-CM | POA: Diagnosis not present

## 2017-03-22 DIAGNOSIS — G894 Chronic pain syndrome: Secondary | ICD-10-CM | POA: Diagnosis not present

## 2017-03-22 DIAGNOSIS — F419 Anxiety disorder, unspecified: Secondary | ICD-10-CM

## 2017-03-22 MED ORDER — MIRTAZAPINE 30 MG PO TABS: 30.0000 mg | ORAL_TABLET | Freq: Every day | ORAL | 1 refills | Status: DC

## 2017-03-22 NOTE — Patient Instructions (Addendum)
Thank you for coming in today. Take the mirtazapine daily.  Increase to 30mg  at night.  Continue pain medicine.  Try to wean off the platform [place].   Use a heating pad.    I will see you on the 10th.

## 2017-03-22 NOTE — Progress Notes (Signed)
Information discussed with pt. Pt verbalized understanding. 

## 2017-03-23 NOTE — Progress Notes (Signed)
[name] is a 83 y.o. female who presents to [facility] today for back pain and mood.  Patient notes continued right-sided thoracic back pain. This is been ongoing intermittently now for a while. She thinks it's worse than her normal chronic pain. She suffered a fracture to her left wrist a few months ago and a platform was attached to her [place]. This has resulted in her being tilted over to the right. She takes oxycodone and uses a fentanyl patch for chronic pain management. This is insufficient to fully treat her current pain. She denies any radiating pain or recent injuries.  Mood: Patient notes continued anxiety and depression symptoms. She currently takes 15 mg of mirtazapine. This has helped but not sufficiently to control her symptoms. She feels hopeless at times but denies any SI or HI.   Past Medical History:  Diagnosis Date  . Anxiety   . Arthritis    osteoarthritis  . Asthma   . C. difficile diarrhea   . Candida esophagitis ([place])   . Chronic sinusitis   . Depression   . Depression   . Diverticulosis   . DJD (degenerative joint disease)     L5 compression fracture  . Edema extremities   . Fibromyalgia   . GERD (gastroesophageal reflux disease)   . Gout   . Hyperlipidemia   . Hypertension   . Osteoarthritis   . Osteopenia   . Panic disorder   . Renal failure, unspecified   . Spinal stenosis of lumbar region   . Tubular adenoma of colon   . Wrist fracture, left    Past Surgical History:  Procedure Laterality Date  . ADENOIDECTOMY    . CARPAL TUNNEL RELEASE  2007, 2009   Bilateral  . CATARACT EXTRACTION, BILATERAL    . CHOANAL ADENIODECTOMY    . DIAGNOSTIC LARYNGOSCOPY N/A 11/05/2015   Procedure: DIAGNOSTIC LARYNGOSCOPY AND ESOPHAGOSCOPY;  Surgeon: Rozetta Nunnery, MD;  Location: [facility];  Service: ENT;  Laterality: N/A;  .  ganglion cyct removal on fingers     right  . ganglion cyst  wrist    . KNEE ARTHROSCOPY     right  . NASAL SINUS SURGERY    . OPEN REDUCTION INTERNAL FIXATION (ORIF) DISTAL RADIAL FRACTURE Left 12/18/2016   Procedure: OPEN REDUCTION INTERNAL FIXATION (ORIF) DISTAL RADIAL FRACTURE;  Surgeon: Iran Planas, MD;  Location: [facility];  Service: Orthopedics;  Laterality: Left;  . SHOULDER SURGERY  2004, 07/30/10, 01/2011   after her right humeral neck fracture, revision hemiarthroplasty 2004 for persistent pain, revision reverse arthroplasty December 2011 for persistent pain, incision and drainage with poly-exchange 01/26/2011 for possible prosthetic joint infection.  . TONSILLECTOMY AND ADENOIDECTOMY    . TOTAL ABDOMINAL HYSTERECTOMY    . TOTAL HIP ARTHROPLASTY  2004   right  . TOTAL KNEE ARTHROPLASTY  2006   right  . TOTAL SHOULDER REPLACEMENT  2002   right   Social History  Substance Use Topics  . Smoking status: Never Smoker  . Smokeless tobacco: Never Used  . Alcohol use No   family history includes Bladder Cancer in her father; Breast cancer in her unknown relative; Colon polyps in her unknown relative; Depression in her son; Diabetes in her mother; Hypertension in her mother.  ROS as above:  Medications: Current Outpatient Prescriptions  Medication Sig Dispense Refill  . ALPRAZolam (XANAX) 0.5 MG tablet Take 1 tablet (0.5 mg total) by  mouth 2 (two) times daily as needed for anxiety. 15 tablet 0  . AMBULATORY NON FORMULARY MEDICATION [place].  Dx: Rheumatoid Arthritis.  Use daily as needed for ambulation. 1 Units 0  . amLODipine (NORVASC) 10 MG tablet take 1 tablet by mouth once daily 90 tablet 2  . Calcium Carbonate-Vitamin D (CALCIUM 600+D) 600-400 MG-UNIT per tablet Take 1 tablet by mouth 2 (two) times daily.    . diclofenac sodium (VOLTAREN) 1 % GEL Apply 1 application topically 4 (four) times daily.  0  . diphenhydrAMINE (BENADRYL) 25 MG tablet Take 25 mg by mouth every 6  (six) hours as needed. For allergies    . EPINEPHrine (ADRENACLICK) 0.3 OV/5.6 mL IJ SOAJ injection Inject 0.3 mLs (0.3 mg total) into the muscle once. 1 Device 1  . fentaNYL (DURAGESIC - DOSED MCG/HR) 12 MCG/HR Place 1 patch (12.5 mcg total) onto the skin every 3 (three) days. 10 patch 0  . hydrochlorothiazide (HYDRODIURIL) 25 MG tablet take 1 tablet by mouth every morning for blood pressure 90 tablet 1  . montelukast (SINGULAIR) 10 MG tablet Take 1 tablet (10 mg total) by mouth at bedtime. 90 tablet 3  . omeprazole (PRILOSEC) 20 MG capsule Take 20 mg by mouth daily.  0  . ondansetron (ZOFRAN) 4 MG tablet Take 1 tablet (4 mg total) by mouth every 8 (eight) hours as needed for nausea or vomiting. 20 tablet 12  . Oxycodone HCl 10 MG TABS Take 1 tablet (10 mg total) by mouth 4 (four) times daily as needed (for pain). 120 tablet 0  . polyethylene glycol (MIRALAX / GLYCOLAX) packet Take 17 g by mouth daily. 14 each 0  . potassium chloride SA (K-DUR,KLOR-CON) 20 MEQ tablet Take 1 tablet (20 mEq total) by mouth daily. 90 tablet 1  . simvastatin (ZOCOR) 10 MG tablet TAKE 1 TABLET BY MOUTH ONCE DAILY 90 tablet 0  . zoledronic acid (RECLAST) 5 MG/100ML SOLN Inject 5 mg into the vein. Once yearly. In June    . mirtazapine (REMERON) 30 MG tablet Take 1 tablet (30 mg total) by mouth at bedtime. 30 tablet 1   No current facility-administered medications for this visit.    Allergies  Allergen Reactions  . Bee Venom Anaphylaxis    Has epi-pen  . Ceftin [Cefuroxime Axetil] Anaphylaxis  . Ciprofloxacin Anaphylaxis and Palpitations  . Ephedrine Other (See Comments) and Palpitations    "knocks me out"  . Sulfonamide Derivatives Anaphylaxis  . Telithromycin Anaphylaxis    Reaction: also blurred vision   . Valsartan Anaphylaxis  . Verapamil Anaphylaxis  . Venlafaxine Other (See Comments)    Insomnia, panic  . Beta Adrenergic Blockers     bradycardia  . Cephalosporins     Allergic Reaction  .  Clindamycin/Lincomycin     Dry skin and itching/ Cdiff  . Cymbalta [Duloxetine Hcl] Other (See Comments)    Reaction:Confusion and " I fall down"  . Doxazosin     "blurred vision and irritable"  . Gabapentin Other (See Comments)    Reaction: headache  . Hydralazine     HA, Diarrhea  . Lamotrigine     Patient unsure at this time  . Morphine Other (See Comments)    Medication has no effect with pain  . Oxycodone-Acetaminophen Hives    Takes plain oxy IR at home (May 2018)  . Pregabalin Swelling  . Prochlorperazine Edisylate   . Pseudoephedrine Other (See Comments)    Reaction: hyperventilates and blacks out  .  Relafen [Nabumetone] Swelling  . Zoloft [Sertraline] Other (See Comments)    "nervous/jittery"  . Doxycycline Hives    Health Maintenance Health Maintenance  Topic Date Due  . TETANUS/TDAP  02/20/2020  . INFLUENZA VACCINE  Completed  . DEXA SCAN  Completed  . PNA vac Low Risk Adult  Completed     Exam:  BP 131/76   Pulse (!) 111  Gen: Well NAD HEENT: EOMI,  MMM Lungs: Normal work of breathing. CTABL Heart: RRR no MRG Abd: NABS, Soft. Nondistended, Nontender Exts: Brisk capillary refill, warm and well perfused.  MSK: Significant thoracic kyphosis. Nontender to spinal midline. Tender to palpation the inferior portion of the right peri-scapula muscle group. Normal arm motion. Mood: Alert and oriented tearful affect at times. No SI or HI expressed.   No results found for this or any previous visit (from the past 72 hour(s)). No results found.    Assessment and Plan: 83 y.o. female with thoracic back pain likely muscle strain due to altered positioning with the platform on her left wrist with the [place]. She is able to bear weight with her left wrist now. Plan to remove the platform portion of the [place] as this will allow her to be more stable. Continue current pain management. Patient has a follow-up appointment scheduled for about 3 weeks from now. We'll  recheck this issue then.  Mood: Stable slightly better than baseline but not great. Plan increase mirtazapine to 30 mg and recheck in a few weeks.  Influenza vaccine given today prior to discharge.   Orders Placed This Encounter  Procedures  . Flu vaccine HIGH DOSE PF   Meds ordered this encounter  Medications  . mirtazapine (REMERON) 30 MG tablet    Sig: Take 1 tablet (30 mg total) by mouth at bedtime.    Dispense:  30 tablet    Refill:  1     Discussed warning signs or symptoms. Please see discharge instructions. Patient expresses understanding.

## 2017-03-24 ENCOUNTER — Encounter (HOSPITAL_COMMUNITY): Payer: Self-pay | Admitting: Psychiatry

## 2017-03-24 ENCOUNTER — Ambulatory Visit (INDEPENDENT_AMBULATORY_CARE_PROVIDER_SITE_OTHER): Payer: Medicare Other | Admitting: Psychiatry

## 2017-03-24 VITALS — BP 122/80 | HR 105 | Resp 16 | Wt 155.0 lb

## 2017-03-24 DIAGNOSIS — I129 Hypertensive chronic kidney disease with stage 1 through stage 4 chronic kidney disease, or unspecified chronic kidney disease: Secondary | ICD-10-CM | POA: Diagnosis not present

## 2017-03-24 DIAGNOSIS — F324 Major depressive disorder, single episode, in partial remission: Secondary | ICD-10-CM | POA: Diagnosis not present

## 2017-03-24 DIAGNOSIS — G894 Chronic pain syndrome: Secondary | ICD-10-CM | POA: Diagnosis not present

## 2017-03-24 DIAGNOSIS — S52572D Other intraarticular fracture of lower end of left radius, subsequent encounter for closed fracture with routine healing: Secondary | ICD-10-CM | POA: Diagnosis not present

## 2017-03-24 DIAGNOSIS — F419 Anxiety disorder, unspecified: Secondary | ICD-10-CM | POA: Diagnosis not present

## 2017-03-24 DIAGNOSIS — Z818 Family history of other mental and behavioral disorders: Secondary | ICD-10-CM | POA: Diagnosis not present

## 2017-03-24 DIAGNOSIS — F39 Unspecified mood [affective] disorder: Secondary | ICD-10-CM

## 2017-03-24 DIAGNOSIS — Z599 Problem related to housing and economic circumstances, unspecified: Secondary | ICD-10-CM

## 2017-03-24 DIAGNOSIS — M1612 Unilateral primary osteoarthritis, left hip: Secondary | ICD-10-CM | POA: Diagnosis not present

## 2017-03-24 DIAGNOSIS — M1712 Unilateral primary osteoarthritis, left knee: Secondary | ICD-10-CM | POA: Diagnosis not present

## 2017-03-24 DIAGNOSIS — Z630 Problems in relationship with spouse or partner: Secondary | ICD-10-CM

## 2017-03-24 DIAGNOSIS — F4323 Adjustment disorder with mixed anxiety and depressed mood: Secondary | ICD-10-CM | POA: Diagnosis not present

## 2017-03-24 DIAGNOSIS — M48061 Spinal stenosis, lumbar region without neurogenic claudication: Secondary | ICD-10-CM | POA: Diagnosis not present

## 2017-03-24 NOTE — Progress Notes (Signed)
[facility] Outpatient Follow up visit   Patient Identification: [name] MRN:  [id number] Date of Evaluation:  03/24/2017 Referral Source: primary care Chief Complaint:    Visit Diagnosis:    ICD-10-CM   1. Major depressive disorder with single episode, in partial remission ([place]) F32.4   2. Adjustment disorder with mixed anxiety and depressed mood F43.23     History of Present Illness:  83 years old currently married Caucasian female initially  referred by primary care physician for management of depression  She has initially presented with depression and also aggravating factor being her physical illness and the husband sickness Remeron was suggested to be increased primary care doctor is increase it 2 days ago she has slept somewhat better but overall circumstances still keep her down provided supportive therapy she takes Xanax when necessary for anxiety Aggravating factors; relationship. Medical concerns. Finances low saving Modifying factors; dogs  Severity of depression:5.5/. 10 being no depression     Past Psychiatric History: depression. When her 4 th kid was born. She had to be hospitalized later for depression  Previous Psychotropic Medications: Yes   Substance Abuse History in the last 12 months:  No.  Consequences of Substance Abuse: NA  Past Medical History:  Past Medical History:  Diagnosis Date  . Anxiety   . Arthritis    osteoarthritis  . Asthma   . C. difficile diarrhea   . Candida esophagitis ([place])   . Chronic sinusitis   . Depression   . Depression   . Diverticulosis   . DJD (degenerative joint disease)     L5 compression fracture  . Edema extremities   . Fibromyalgia   . GERD (gastroesophageal reflux disease)   . Gout   . Hyperlipidemia   . Hypertension   . Osteoarthritis   . Osteopenia   . Panic disorder   . Renal failure, unspecified   . Spinal stenosis of lumbar region   . Tubular adenoma of colon   . Wrist fracture, left     Past  Surgical History:  Procedure Laterality Date  . ADENOIDECTOMY    . CARPAL TUNNEL RELEASE  2007, 2009   Bilateral  . CATARACT EXTRACTION, BILATERAL    . CHOANAL ADENIODECTOMY    . DIAGNOSTIC LARYNGOSCOPY N/A 11/05/2015   Procedure: DIAGNOSTIC LARYNGOSCOPY AND ESOPHAGOSCOPY;  Surgeon: Rozetta Nunnery, MD;  Location: [facility];  Service: ENT;  Laterality: N/A;  . ganglion cyct removal on fingers     right  . ganglion cyst  wrist    . KNEE ARTHROSCOPY     right  . NASAL SINUS SURGERY    . OPEN REDUCTION INTERNAL FIXATION (ORIF) DISTAL RADIAL FRACTURE Left 12/18/2016   Procedure: OPEN REDUCTION INTERNAL FIXATION (ORIF) DISTAL RADIAL FRACTURE;  Surgeon: Iran Planas, MD;  Location: [facility];  Service: Orthopedics;  Laterality: Left;  . SHOULDER SURGERY  2004, 07/30/10, 01/2011   after her right humeral neck fracture, revision hemiarthroplasty 2004 for persistent pain, revision reverse arthroplasty December 2011 for persistent pain, incision and drainage with poly-exchange 01/26/2011 for possible prosthetic joint infection.  . TONSILLECTOMY AND ADENOIDECTOMY    . TOTAL ABDOMINAL HYSTERECTOMY    . TOTAL HIP ARTHROPLASTY  2004   right  . TOTAL KNEE ARTHROPLASTY  2006   right  . TOTAL SHOULDER REPLACEMENT  2002   right    Family Psychiatric History: denies. But believe her mom may have had depression. Her son  Family History:  Family History  Problem Relation Age  of Onset  . Bladder Cancer Father   . Diabetes Mother   . Hypertension Mother   . Breast cancer Unknown   . Colon polyps Unknown   . Depression Son     Social History:   Social History   Social History  . Marital status: Married    Spouse name: N/A  . Number of children: N/A  . Years of education: N/A   Social History Main Topics  . Smoking status: Never Smoker  . Smokeless tobacco: Never Used  . Alcohol use No  . Drug use: No  . Sexual activity: No   Other Topics Concern  . None   Social History Narrative  .  None     Allergies:   Allergies  Allergen Reactions  . Bee Venom Anaphylaxis    Has epi-pen  . Ceftin [Cefuroxime Axetil] Anaphylaxis  . Ciprofloxacin Anaphylaxis and Palpitations  . Ephedrine Other (See Comments) and Palpitations    "knocks me out"  . Sulfonamide Derivatives Anaphylaxis  . Telithromycin Anaphylaxis    Reaction: also blurred vision   . Valsartan Anaphylaxis  . Verapamil Anaphylaxis  . Venlafaxine Other (See Comments)    Insomnia, panic  . Beta Adrenergic Blockers     bradycardia  . Cephalosporins     Allergic Reaction  . Clindamycin/Lincomycin     Dry skin and itching/ Cdiff  . Cymbalta [Duloxetine Hcl] Other (See Comments)    Reaction:Confusion and " I fall down"  . Doxazosin     "blurred vision and irritable"  . Gabapentin Other (See Comments)    Reaction: headache  . Hydralazine     HA, Diarrhea  . Lamotrigine     Patient unsure at this time  . Morphine Other (See Comments)    Medication has no effect with pain  . Oxycodone-Acetaminophen Hives    Takes plain oxy IR at home (May 2018)  . Pregabalin Swelling  . Prochlorperazine Edisylate   . Pseudoephedrine Other (See Comments)    Reaction: hyperventilates and blacks out  . Relafen [Nabumetone] Swelling  . Zoloft [Sertraline] Other (See Comments)    "nervous/jittery"  . Doxycycline Hives    Metabolic Disorder Labs: Lab Results  Component Value Date   HGBA1C 6.1 (H) 02/10/2016   MPG 128 02/10/2016   MPG 131 (H) 05/26/2015   No results found for: PROLACTIN Lab Results  Component Value Date   CHOL 130 11/24/2015   TRIG 191 (H) 11/24/2015   HDL 29 (L) 11/24/2015   CHOLHDL 4.5 11/24/2015   VLDL 38 (H) 11/24/2015   LDLCALC 63 11/24/2015   LDLCALC 96 09/04/2014     Current Medications: Current Outpatient Prescriptions  Medication Sig Dispense Refill  . ALPRAZolam (XANAX) 0.5 MG tablet Take 1 tablet (0.5 mg total) by mouth 2 (two) times daily as needed for anxiety. 15 tablet 0  .  AMBULATORY NON FORMULARY MEDICATION [place].  Dx: Rheumatoid Arthritis.  Use daily as needed for ambulation. 1 Units 0  . amLODipine (NORVASC) 10 MG tablet take 1 tablet by mouth once daily 90 tablet 2  . Calcium Carbonate-Vitamin D (CALCIUM 600+D) 600-400 MG-UNIT per tablet Take 1 tablet by mouth 2 (two) times daily.    . diclofenac sodium (VOLTAREN) 1 % GEL Apply 1 application topically 4 (four) times daily.  0  . diphenhydrAMINE (BENADRYL) 25 MG tablet Take 25 mg by mouth every 6 (six) hours as needed. For allergies    . EPINEPHrine (ADRENACLICK) 0.3 PP/5.0 mL IJ SOAJ injection  Inject 0.3 mLs (0.3 mg total) into the muscle once. 1 Device 1  . fentaNYL (DURAGESIC - DOSED MCG/HR) 12 MCG/HR Place 1 patch (12.5 mcg total) onto the skin every 3 (three) days. 10 patch 0  . hydrochlorothiazide (HYDRODIURIL) 25 MG tablet take 1 tablet by mouth every morning for blood pressure 90 tablet 1  . mirtazapine (REMERON) 30 MG tablet Take 1 tablet (30 mg total) by mouth at bedtime. 30 tablet 1  . montelukast (SINGULAIR) 10 MG tablet Take 1 tablet (10 mg total) by mouth at bedtime. 90 tablet 3  . omeprazole (PRILOSEC) 20 MG capsule Take 20 mg by mouth daily.  0  . ondansetron (ZOFRAN) 4 MG tablet Take 1 tablet (4 mg total) by mouth every 8 (eight) hours as needed for nausea or vomiting. 20 tablet 12  . Oxycodone HCl 10 MG TABS Take 1 tablet (10 mg total) by mouth 4 (four) times daily as needed (for pain). 120 tablet 0  . polyethylene glycol (MIRALAX / GLYCOLAX) packet Take 17 g by mouth daily. 14 each 0  . potassium chloride SA (K-DUR,KLOR-CON) 20 MEQ tablet Take 1 tablet (20 mEq total) by mouth daily. 90 tablet 1  . simvastatin (ZOCOR) 10 MG tablet TAKE 1 TABLET BY MOUTH ONCE DAILY 90 tablet 0  . zoledronic acid (RECLAST) 5 MG/100ML SOLN Inject 5 mg into the vein. Once yearly. In June     No current facility-administered medications for this visit.       Psychiatric Specialty Exam: Review of Systems   Cardiovascular: Negative for palpitations.  Skin: Negative for rash.  Psychiatric/Behavioral: Negative for suicidal ideas.    There were no vitals taken for this visit.There is no height or weight on file to calculate BMI.  General Appearance: Casual  Eye Contact:  Fair  Speech:  Normal Rate  Volume:  Decreased  Mood:  Somewhat dysphoric  Affect:  congruent  Thought Process:  Goal Directed  Orientation:  Full (Time, Place, and Person)  Thought Content:  Rumination  Suicidal Thoughts:  No  Homicidal Thoughts:  No  Memory:  Immediate;   Fair Recent;   Fair  Judgement:  Fair  Insight:  Fair  Psychomotor Activity:  Decreased  Concentration:  Concentration: Fair and Attention Span: Fair  Recall:  [employer] of Knowledge:Fair  Language: Fair  Akathisia:  Negative  Handed:  Right  AIMS (if indicated):    Assets:  Desire for Improvement  ADL's:  Intact  Cognition: WNL  Sleep:  fair    Treatment Plan Summary: Medication management and Plan as follows  1. Major depression recurrent,mild: mirtazepine has been increased to 30mg  qhs rencently. Wants to give more time to work  2.  Generalized anxiety disorder; provided supportive therapy. Work on positive thoughts. Takes xanax prn  3. Mood disorder relavant to phsyical condition and medical condition as well. She has limitations and gets tired. Follow up with pcp to continue to work on it.   Husband is sick as well.  Provided supportive therapy. No need to add more anti depressant as well.  FU 3-4 weeks or eariler if needed. Not interested in therapy   Merian Capron, MD 8/24/201812:57 PM

## 2017-03-27 ENCOUNTER — Telehealth: Payer: Self-pay | Admitting: *Deleted

## 2017-03-27 MED ORDER — ALPRAZOLAM 0.5 MG PO TABS: 0.5000 mg | ORAL_TABLET | Freq: Two times a day (BID) | ORAL | 3 refills | Status: DC | PRN

## 2017-03-27 NOTE — Telephone Encounter (Signed)
Pt wanted to know if Dr. Georgina Snell will refill the Xanax for her. She reports that Dr. Olena Heckle will not prescribe this medication for her and that she will need to get this from her pcp.   I told her that I would fwd her request to Dr. Georgina Snell for f/u.Audelia Hives [place]

## 2017-03-27 NOTE — Telephone Encounter (Signed)
Xanax refilled.  

## 2017-03-28 ENCOUNTER — Ambulatory Visit (INDEPENDENT_AMBULATORY_CARE_PROVIDER_SITE_OTHER): Payer: Medicare Other

## 2017-03-28 ENCOUNTER — Ambulatory Visit (INDEPENDENT_AMBULATORY_CARE_PROVIDER_SITE_OTHER): Payer: Medicare Other | Admitting: Family Medicine

## 2017-03-28 VITALS — BP 115/66 | HR 116 | Wt 156.0 lb

## 2017-03-28 DIAGNOSIS — M25572 Pain in left ankle and joints of left foot: Secondary | ICD-10-CM | POA: Diagnosis not present

## 2017-03-28 DIAGNOSIS — M7989 Other specified soft tissue disorders: Secondary | ICD-10-CM

## 2017-03-28 DIAGNOSIS — M25472 Effusion, left ankle: Secondary | ICD-10-CM

## 2017-03-28 DIAGNOSIS — M19072 Primary osteoarthritis, left ankle and foot: Secondary | ICD-10-CM | POA: Diagnosis not present

## 2017-03-28 NOTE — Patient Instructions (Signed)
Thank you for coming in today. Call or go to the ER if you develop a large red swollen joint with extreme pain or oozing puss.  Recheck in September as scheduled or sooner if needed.     Gout Gout is painful swelling that can occur in some of your joints. Gout is a type of arthritis. This condition is caused by having too much uric acid in your body. Uric acid is a chemical that forms when your body breaks down substances called purines. Purines are important for building body proteins. When your body has too much uric acid, sharp crystals can form and build up inside your joints. This causes pain and swelling. Gout attacks can happen quickly and be very painful (acute gout). Over time, the attacks can affect more joints and become more frequent (chronic gout). Gout can also cause uric acid to build up under your skin and inside your kidneys. What are the causes? This condition is caused by too much uric acid in your blood. This can occur because:  Your kidneys do not remove enough uric acid from your blood. This is the most common cause.  Your body makes too much uric acid. This can occur with some cancers and cancer treatments. It can also occur if your body is breaking down too many red blood cells (hemolytic anemia).  You eat too many foods that are high in purines. These foods include organ meats and some seafood. Alcohol, especially beer, is also high in purines.  A gout attack may be triggered by trauma or stress. What increases the risk? This condition is more likely to develop in people who:  Have a family history of gout.  Are female and middle-aged.  Are female and have gone through menopause.  Are obese.  Frequently drink alcohol, especially beer.  Are dehydrated.  Lose weight too quickly.  Have an organ transplant.  Have lead poisoning.  Take certain medicines, including aspirin, cyclosporine, diuretics, levodopa, and niacin.  Have kidney disease or  psoriasis.  What are the signs or symptoms? An attack of acute gout happens quickly. It usually occurs in just one joint. The most common place is the big toe. Attacks often start at night. Other joints that may be affected include joints of the feet, ankle, knee, fingers, wrist, or elbow. Symptoms may include:  Severe pain.  Warmth.  Swelling.  Stiffness.  Tenderness. The affected joint may be very painful to touch.  Shiny, red, or purple skin.  Chills and fever.  Chronic gout may cause symptoms more frequently. More joints may be involved. You may also have white or yellow lumps (tophi) on your hands or feet or in other areas near your joints. How is this diagnosed? This condition is diagnosed based on your symptoms, medical history, and physical exam. You may have tests, such as:  Blood tests to measure uric acid levels.  Removal of joint fluid with a needle (aspiration) to look for uric acid crystals.  X-rays to look for joint damage.  How is this treated? Treatment for this condition has two phases: treating an acute attack and preventing future attacks. Acute gout treatment may include medicines to reduce pain and swelling, including:  NSAIDs.  Steroids. These are strong anti-inflammatory medicines that can be taken by mouth (orally) or injected into a joint.  Colchicine. This medicine relieves pain and swelling when it is taken soon after an attack. It can be given orally or through an IV tube.  Preventive treatment may  include:  Daily use of smaller doses of NSAIDs or colchicine.  Use of a medicine that reduces uric acid levels in your blood.  Changes to your diet. You may need to see a specialist about healthy eating (dietitian).  Follow these instructions at home: During a Gout Attack  If directed, apply ice to the affected area: ? Put ice in a plastic bag. ? Place a towel between your skin and the bag. ? Leave the ice on for 20 minutes, 2-3 times a  day.  Rest the joint as much as possible. If the affected joint is in your leg, you may be given crutches to use.  Raise (elevate) the affected joint above the level of your heart as often as possible.  Drink enough fluids to keep your urine clear or pale yellow.  Take over-the-counter and prescription medicines only as told by your health care provider.  Do not drive or operate heavy machinery while taking prescription pain medicine.  Follow instructions from your health care provider about eating or drinking restrictions.  Return to your normal activities as told by your health care provider. Ask your health care provider what activities are safe for you. Avoiding Future Gout Attacks  Follow a low-purine diet as told by your dietitian or health care provider. Avoid foods and drinks that are high in purines, including liver, kidney, anchovies, asparagus, herring, mushrooms, mussels, and beer.  Limit alcohol intake to no more than 1 drink a day for nonpregnant women and 2 drinks a day for men. One drink equals 12 oz of beer, 5 oz of wine, or 1 oz of hard liquor.  Maintain a healthy weight or lose weight if you are overweight. If you want to lose weight, talk with your health care provider. It is important that you do not lose weight too quickly.  Start or maintain an exercise program as told by your health care provider.  Drink enough fluids to keep your urine clear or pale yellow.  Take over-the-counter and prescription medicines only as told by your health care provider.  Keep all follow-up visits as told by your health care provider. This is important. Contact a health care provider if:  You have another gout attack.  You continue to have symptoms of a gout attack after10 days of treatment.  You have side effects from your medicines.  You have chills or a fever.  You have burning pain when you urinate.  You have pain in your lower back or belly. Get help right away  if:  You have severe or uncontrolled pain.  You cannot urinate. This information is not intended to replace advice given to you by your health care provider. Make sure you discuss any questions you have with your health care provider. Document Released: 07/15/2000 Document Revised: 12/24/2015 Document Reviewed: 04/30/2015 Elsevier Interactive Patient Education  2017 [employer].

## 2017-03-28 NOTE — Progress Notes (Signed)
[name] is a 83 y.o. female who presents to [facility] today for left foot pain. Patient has a several day history of left ankle and foot pain and swelling. She denies any injury or radiating pain weakness or numbness. No fevers or chills. She notes she has a remote history of gout. She has tried taking a prescription opiate medications for pain which helped a bit. The pain is Severe and interferes with walking.   Past Medical History:  Diagnosis Date  . Anxiety   . Arthritis    osteoarthritis  . Asthma   . C. difficile diarrhea   . Candida esophagitis ([place])   . Chronic sinusitis   . Depression   . Depression   . Diverticulosis   . DJD (degenerative joint disease)     L5 compression fracture  . Edema extremities   . Fibromyalgia   . GERD (gastroesophageal reflux disease)   . Gout   . Hyperlipidemia   . Hypertension   . Osteoarthritis   . Osteopenia   . Panic disorder   . Renal failure, unspecified   . Spinal stenosis of lumbar region   . Tubular adenoma of colon   . Wrist fracture, left    Past Surgical History:  Procedure Laterality Date  . ADENOIDECTOMY    . CARPAL TUNNEL RELEASE  2007, 2009   Bilateral  . CATARACT EXTRACTION, BILATERAL    . CHOANAL ADENIODECTOMY    . DIAGNOSTIC LARYNGOSCOPY N/A 11/05/2015   Procedure: DIAGNOSTIC LARYNGOSCOPY AND ESOPHAGOSCOPY;  Surgeon: Rozetta Nunnery, MD;  Location: [facility];  Service: ENT;  Laterality: N/A;  . ganglion cyct removal on fingers     right  . ganglion cyst  wrist    . KNEE ARTHROSCOPY     right  . NASAL SINUS SURGERY    . OPEN REDUCTION INTERNAL FIXATION (ORIF) DISTAL RADIAL FRACTURE Left 12/18/2016   Procedure: OPEN REDUCTION INTERNAL FIXATION (ORIF) DISTAL RADIAL FRACTURE;  Surgeon: Iran Planas, MD;  Location: [facility];  Service: Orthopedics;  Laterality: Left;  . SHOULDER SURGERY  2004, 07/30/10, 01/2011   after her right humeral neck fracture, revision  hemiarthroplasty 2004 for persistent pain, revision reverse arthroplasty December 2011 for persistent pain, incision and drainage with poly-exchange 01/26/2011 for possible prosthetic joint infection.  . TONSILLECTOMY AND ADENOIDECTOMY    . TOTAL ABDOMINAL HYSTERECTOMY    . TOTAL HIP ARTHROPLASTY  2004   right  . TOTAL KNEE ARTHROPLASTY  2006   right  . TOTAL SHOULDER REPLACEMENT  2002   right   Social History  Substance Use Topics  . Smoking status: Never Smoker  . Smokeless tobacco: Never Used  . Alcohol use No     ROS:  As above   Medications: Current Outpatient Prescriptions  Medication Sig Dispense Refill  . ALPRAZolam (XANAX) 0.5 MG tablet Take 1 tablet (0.5 mg total) by mouth 2 (two) times daily as needed for anxiety. 60 tablet 3  . AMBULATORY NON FORMULARY MEDICATION Rolling [place].  Dx: Rheumatoid Arthritis.  Use daily as needed for ambulation. 1 Units 0  . amLODipine (NORVASC) 10 MG tablet take 1 tablet by mouth once daily 90 tablet 2  . Calcium Carbonate-Vitamin D (CALCIUM 600+D) 600-400 MG-UNIT per tablet Take 1 tablet by mouth 2 (two) times daily.    . diclofenac sodium (VOLTAREN) 1 % GEL Apply 1 application topically 4 (four) times daily.  0  . diphenhydrAMINE (BENADRYL) 25 MG tablet Take 25  mg by mouth every 6 (six) hours as needed. For allergies    . EPINEPHrine (ADRENACLICK) 0.3 HF/0.2 mL IJ SOAJ injection Inject 0.3 mLs (0.3 mg total) into the muscle once. 1 Device 1  . fentaNYL (DURAGESIC - DOSED MCG/HR) 12 MCG/HR Place 1 patch (12.5 mcg total) onto the skin every 3 (three) days. 10 patch 0  . hydrochlorothiazide (HYDRODIURIL) 25 MG tablet take 1 tablet by mouth every morning for blood pressure 90 tablet 1  . mirtazapine (REMERON) 30 MG tablet Take 1 tablet (30 mg total) by mouth at bedtime. 30 tablet 1  . montelukast (SINGULAIR) 10 MG tablet Take 1 tablet (10 mg total) by mouth at bedtime. 90 tablet 3  . omeprazole (PRILOSEC) 20 MG capsule Take 20 mg by mouth  daily.  0  . ondansetron (ZOFRAN) 4 MG tablet Take 1 tablet (4 mg total) by mouth every 8 (eight) hours as needed for nausea or vomiting. 20 tablet 12  . Oxycodone HCl 10 MG TABS Take 1 tablet (10 mg total) by mouth 4 (four) times daily as needed (for pain). 120 tablet 0  . polyethylene glycol (MIRALAX / GLYCOLAX) packet Take 17 g by mouth daily. 14 each 0  . potassium chloride SA (K-DUR,KLOR-CON) 20 MEQ tablet Take 1 tablet (20 mEq total) by mouth daily. 90 tablet 1  . simvastatin (ZOCOR) 10 MG tablet TAKE 1 TABLET BY MOUTH ONCE DAILY 90 tablet 0  . zoledronic acid (RECLAST) 5 MG/100ML SOLN Inject 5 mg into the vein. Once yearly. In June     No current facility-administered medications for this visit.    Allergies  Allergen Reactions  . Bee Venom Anaphylaxis    Has epi-pen  . Ceftin [Cefuroxime Axetil] Anaphylaxis  . Ciprofloxacin Anaphylaxis and Palpitations  . Ephedrine Other (See Comments) and Palpitations    "knocks me out"  . Sulfonamide Derivatives Anaphylaxis  . Telithromycin Anaphylaxis    Reaction: also blurred vision   . Valsartan Anaphylaxis  . Verapamil Anaphylaxis  . Venlafaxine Other (See Comments)    Insomnia, panic  . Beta Adrenergic Blockers     bradycardia  . Cephalosporins     Allergic Reaction  . Clindamycin/Lincomycin     Dry skin and itching/ Cdiff  . Cymbalta [Duloxetine Hcl] Other (See Comments)    Reaction:Confusion and " I fall down"  . Doxazosin     "blurred vision and irritable"  . Gabapentin Other (See Comments)    Reaction: headache  . Hydralazine     HA, Diarrhea  . Lamotrigine     Patient unsure at this time  . Morphine Other (See Comments)    Medication has no effect with pain  . Oxycodone-Acetaminophen Hives    Takes plain oxy IR at home (May 2018)  . Pregabalin Swelling  . Prochlorperazine Edisylate   . Pseudoephedrine Other (See Comments)    Reaction: hyperventilates and blacks out  . Relafen [Nabumetone] Swelling  . Zoloft  [Sertraline] Other (See Comments)    "nervous/jittery"  . Doxycycline Hives     Exam:  BP 115/66 (BP Location: Left Arm, Patient Position: Sitting, Cuff Size: Normal)   Pulse (!) 116   Wt 156 lb (70.8 kg)   SpO2 95%   BMI 30.47 kg/m  General: Well Developed, well nourished, and in no acute distress.  Neuro/Psych: Alert and oriented x3, extra-ocular muscles intact, able to move all 4 extremities, sensation grossly intact. Skin: Warm and dry, no rashes noted.  Respiratory: Not using accessory muscles,  speaking in full sentences, trachea midline.  Cardiovascular: Pulses palpable, no extremity edema. Abdomen: Does not appear distended. MSK: Left ankle effusion and warm to touch. Tender diffusely. Pulses capillary refill and sensation are intact.  Limited musculoskeletal ultrasound reveals an ankle effusion.    Procedure: Real-time Ultrasound Guided  aspiration and injection of left ankle  Device: GE Logiq E  Images permanently stored and available for review in the ultrasound unit. Verbal informed consent obtained. Discussed risks and benefits of procedure. Warned about infection bleeding damage to structures skin hypopigmentation and fat atrophy among others. Patient expresses understanding and agreement Time-out conducted.  Noted no overlying erythema, induration, or other signs of local infection.  Skin prepped in a sterile fashion.  Local anesthesia: Topical Ethyl chloride.  1 mL of lidocaine used to anesthetize the skin. With sterile technique and under real time ultrasound guidance: 18-gauge needle used to access the lateral ankle effusion. 5 mm of cloudy fluid aspirated syringe was exchanged and 40 mg of Kenalog and 1 mL of Marcaine injected easily.  Completed without difficulty  Pain partially resolved suggesting accurate placement of the medication.  Advised to call if fevers/chills, erythema, induration, drainage, or persistent bleeding.  Images permanently  stored and available for review in the ultrasound unit.  Impression: Technically successful ultrasound guided injection.      No results found for this or any previous visit (from the past 48 hour(s)). Dg Ankle Complete Left  Result Date: 03/28/2017 CLINICAL DATA:  Three days of left ankle pain and swelling and erythema. No report of injury. Clinical suspicion of gout versus degenerative change. EXAM: LEFT ANKLE COMPLETE - 3+ VIEW COMPARISON:  None in PACs FINDINGS: There is diffuse soft tissue swelling. The bones are subjectively mildly osteopenic. The ankle joint mortise is preserved. The talar dome is intact. The talus and calcaneus exhibit no acute abnormalities. There is a large plantar calcaneal spur. There is calcification within the plantar fascia. There is calcification within the substance of the Achilles tendon. There are degenerative changes of the intertarsal joints as well as of the tarsometatarsal joints where visualized. IMPRESSION: Mild degenerative change of the tibiotalar joint. No significant proliferative or erosive changes are observed. There is diffuse soft tissue swelling. There is moderate degenerative change of the intertarsal joints with mild degenerative change of the first tarsometatarsal joint. Electronically Signed   By: David  Martinique M.D.   On: 03/28/2017 16:46      Assessment and Plan: 83 y.o. female with left ankle pain and effusion in the setting of DJD and remote history of gout. Small amount of fluid was aspirated for diagnostic testing. Plan for culture cell count differential and crystal analysis. Hopefully the steroid injection will provide some pain relief. If gout will start colchicine. Patient has a follow-up appointment scheduled in less than 2 weeks. She'll return sooner if needed.    Orders Placed This Encounter  Procedures  . Body Fluid Culture    Left ankle effusion  . DG Ankle Complete Left    Standing Status:   Future    Number of  Occurrences:   1    Standing Expiration Date:   05/28/2018    Order Specific Question:   Reason for Exam (SYMPTOM  OR DIAGNOSIS REQUIRED)    Answer:   eval pain. ? Gout vs DJD    Order Specific Question:   Preferred imaging location?    Answer:   [facility]    Order Specific Question:   Radiology Contrast  Protocol - do NOT remove file path    Answer:   [email or web address]  . Synovial cell count + diff, w/ crystals    Left ankle effusion   No orders of the defined types were placed in this encounter.   Discussed warning signs or symptoms. Please see discharge instructions. Patient expresses understanding.

## 2017-03-29 ENCOUNTER — Ambulatory Visit (HOSPITAL_COMMUNITY): Payer: Self-pay | Admitting: Psychiatry

## 2017-03-29 LAB — SYNOVIAL CELL COUNT + DIFF, W/ CRYSTALS
BASOPHILS, %: 0 %
Eosinophils-Synovial: 0 % (ref 0–2)
LYMPHOCYTES-SYNOVIAL FLD: 0 % (ref 0–74)
Monocyte/Macrophage: 8 % (ref 0–69)
Neutrophil, Synovial: 92 % — ABNORMAL HIGH (ref 0–24)
SYNOVIOCYTES, %: 0 % (ref 0–15)
WBC, Synovial: 17930 cells/uL — ABNORMAL HIGH (ref <150)

## 2017-04-02 LAB — BODY FLUID CULTURE
GRAM STAIN: NONE SEEN
ORGANISM ID, BACTERIA: NO GROWTH

## 2017-04-10 ENCOUNTER — Encounter: Payer: Self-pay | Admitting: Family Medicine

## 2017-04-10 ENCOUNTER — Ambulatory Visit (INDEPENDENT_AMBULATORY_CARE_PROVIDER_SITE_OTHER): Payer: Medicare Other | Admitting: Family Medicine

## 2017-04-10 ENCOUNTER — Ambulatory Visit: Payer: Self-pay | Admitting: Family Medicine

## 2017-04-10 VITALS — BP 131/68 | HR 104 | Wt 156.0 lb

## 2017-04-10 DIAGNOSIS — R519 Headache, unspecified: Secondary | ICD-10-CM

## 2017-04-10 DIAGNOSIS — R51 Headache: Secondary | ICD-10-CM

## 2017-04-10 DIAGNOSIS — F329 Major depressive disorder, single episode, unspecified: Secondary | ICD-10-CM

## 2017-04-10 DIAGNOSIS — L03818 Cellulitis of other sites: Secondary | ICD-10-CM | POA: Diagnosis not present

## 2017-04-10 DIAGNOSIS — G894 Chronic pain syndrome: Secondary | ICD-10-CM | POA: Diagnosis not present

## 2017-04-10 DIAGNOSIS — F419 Anxiety disorder, unspecified: Secondary | ICD-10-CM | POA: Diagnosis not present

## 2017-04-10 MED ORDER — METRONIDAZOLE 0.75 % EX GEL: 1.0000 "application " | Freq: Two times a day (BID) | CUTANEOUS | 3 refills | Status: AC

## 2017-04-10 MED ORDER — ALPRAZOLAM 0.5 MG PO TABS: 0.5000 mg | ORAL_TABLET | Freq: Three times a day (TID) | ORAL | 3 refills | Status: DC | PRN

## 2017-04-10 MED ORDER — FENTANYL 12 MCG/HR TD PT72: 12.5000 ug | MEDICATED_PATCH | TRANSDERMAL | 0 refills | Status: DC

## 2017-04-10 MED ORDER — OXYCODONE HCL 10 MG PO TABS: 10.0000 mg | ORAL_TABLET | Freq: Three times a day (TID) | ORAL | 0 refills | Status: DC | PRN

## 2017-04-10 MED ORDER — AMOXICILLIN 500 MG PO CAPS: 500.0000 mg | ORAL_CAPSULE | Freq: Three times a day (TID) | ORAL | 0 refills | Status: DC

## 2017-04-10 MED ORDER — MUPIROCIN 2 % EX OINT: TOPICAL_OINTMENT | CUTANEOUS | 3 refills | Status: AC

## 2017-04-10 NOTE — Patient Instructions (Addendum)
Thank you for coming in today. Apply the gel to your face and the ointment to your ear. Take amoxicillin.  Increase xanax to 3x daily and decrease oxycodone to 2-3x daily.  Recheck with me in 1 month.  Follow up with Psychiatry.    Cellulitis, Adult Cellulitis is a skin infection. The infected area is usually red and tender. This condition occurs most often in the arms and lower legs. The infection can travel to the muscles, blood, and underlying tissue and become serious. It is very important to get treated for this condition. What are the causes? Cellulitis is caused by bacteria. The bacteria enter through a break in the skin, such as a cut, burn, insect bite, open sore, or crack. What increases the risk? This condition is more likely to occur in people who:  Have a weak defense system (immune system).  Have open wounds on the skin such as cuts, burns, bites, and scrapes. Bacteria can enter the body through these open wounds.  Are older.  Have diabetes.  Have a type of long-lasting (chronic) liver disease (cirrhosis) or kidney disease.  Use IV drugs.  What are the signs or symptoms? Symptoms of this condition include:  Redness, streaking, or spotting on the skin.  Swollen area of the skin.  Tenderness or pain when an area of the skin is touched.  Warm skin.  Fever.  Chills.  Blisters.  How is this diagnosed? This condition is diagnosed based on a medical history and physical exam. You may also have tests, including:  Blood tests.  Lab tests.  Imaging tests.  How is this treated? Treatment for this condition may include:  Medicines, such as antibiotic medicines or antihistamines.  Supportive care, such as rest and application of cold or warm cloths (cold or warm compresses) to the skin.  Hospital care, if the condition is severe.  The infection usually gets better within 1-2 days of treatment. Follow these instructions at home:  Take over-the-counter  and prescription medicines only as told by your health care provider.  If you were prescribed an antibiotic medicine, take it as told by your health care provider. Do not stop taking the antibiotic even if you start to feel better.  Drink enough fluid to keep your urine clear or pale yellow.  Do not touch or rub the infected area.  Raise (elevate) the infected area above the level of your heart while you are sitting or lying down.  Apply warm or cold compresses to the affected area as told by your health care provider.  Keep all follow-up visits as told by your health care provider. This is important. These visits let your health care provider make sure a more serious infection is not developing. Contact a health care provider if:  You have a fever.  Your symptoms do not improve within 1-2 days of starting treatment.  Your bone or joint underneath the infected area becomes painful after the skin has healed.  Your infection returns in the same area or another area.  You notice a swollen bump in the infected area.  You develop new symptoms.  You have a general ill feeling (malaise) with muscle aches and pains. Get help right away if:  Your symptoms get worse.  You feel very sleepy.  You develop vomiting or diarrhea that persists.  You notice red streaks coming from the infected area.  Your red area gets larger or turns dark in color. This information is not intended to replace advice given  to you by your health care provider. Make sure you discuss any questions you have with your health care provider. Document Released: 04/27/2005 Document Revised: 11/26/2015 Document Reviewed: 05/27/2015 Elsevier Interactive Patient Education  2017 [employer].

## 2017-04-10 NOTE — Progress Notes (Signed)
[name] is a 83 y.o. female who presents to [place]: [place] today for anxiety, left ear pain, chronic pain.  Anxiety: Patient notes continued severe anxiety. She's been increasing her Xanax to 3 times a day recently and seems to be tolerating well. She notes the Xanax allows her to function and she would like to continue the current dose.  Ear pain: Patient notes a red irritated area on left external ear. This is tender and present for several days. She denies any fevers or chills.  Face burning: Patient notes burning face symptoms present for a few days. She denies any new cosmetics of detergent shampoo. Symptoms are mild and are described as tingling.  Chronic pain: Patient continues to take oxycodone and fentanyl for chronic pain. She uses continues to use a fentanyl patch every 3 days and has decreased her oxycodone dose. She typically was taking it 4 times a day and takes it mostly 2-3 times per day now.   Past Medical History:  Diagnosis Date  . Anxiety   . Arthritis    osteoarthritis  . Asthma   . C. difficile diarrhea   . Candida esophagitis ([place])   . Chronic sinusitis   . Depression   . Depression   . Diverticulosis   . DJD (degenerative joint disease)     L5 compression fracture  . Edema extremities   . Fibromyalgia   . GERD (gastroesophageal reflux disease)   . Gout   . Hyperlipidemia   . Hypertension   . Osteoarthritis   . Osteopenia   . Panic disorder   . Renal failure, unspecified   . Spinal stenosis of lumbar region   . Tubular adenoma of colon   . Wrist fracture, left    Past Surgical History:  Procedure Laterality Date  . ADENOIDECTOMY    . CARPAL TUNNEL RELEASE  2007, 2009   Bilateral  . CATARACT EXTRACTION, BILATERAL    . CHOANAL ADENIODECTOMY    . DIAGNOSTIC LARYNGOSCOPY N/A 11/05/2015   Procedure: DIAGNOSTIC LARYNGOSCOPY  AND ESOPHAGOSCOPY;  Surgeon: Rozetta Nunnery, MD;  Location: [facility];  Service: ENT;  Laterality: N/A;  . ganglion cyct removal on fingers     right  . ganglion cyst  wrist    . KNEE ARTHROSCOPY     right  . NASAL SINUS SURGERY    . OPEN REDUCTION INTERNAL FIXATION (ORIF) DISTAL RADIAL FRACTURE Left 12/18/2016   Procedure: OPEN REDUCTION INTERNAL FIXATION (ORIF) DISTAL RADIAL FRACTURE;  Surgeon: Iran Planas, MD;  Location: [facility];  Service: Orthopedics;  Laterality: Left;  . SHOULDER SURGERY  2004, 07/30/10, 01/2011   after her right humeral neck fracture, revision hemiarthroplasty 2004 for persistent pain, revision reverse arthroplasty December 2011 for persistent pain, incision and drainage with poly-exchange 01/26/2011 for possible prosthetic joint infection.  . TONSILLECTOMY AND ADENOIDECTOMY    . TOTAL ABDOMINAL HYSTERECTOMY    . TOTAL HIP ARTHROPLASTY  2004   right  . TOTAL KNEE ARTHROPLASTY  2006   right  . TOTAL SHOULDER REPLACEMENT  2002   right   Social History  Substance Use Topics  . Smoking status: Never Smoker  . Smokeless tobacco: Never Used  . Alcohol use No   family history includes Bladder Cancer in her father; Breast cancer in her unknown relative; Colon polyps in her unknown relative; Depression in her son; Diabetes in her mother; Hypertension in her mother.  ROS as above:  Medications: Current Outpatient Prescriptions  Medication Sig Dispense Refill  . ALPRAZolam (XANAX) 0.5 MG tablet Take 1 tablet (0.5 mg total) by mouth 3 (three) times daily as needed for anxiety. 90 tablet 3  . AMBULATORY NON FORMULARY MEDICATION Rolling [place].  Dx: Rheumatoid Arthritis.  Use daily as needed for ambulation. 1 Units 0  . amLODipine (NORVASC) 10 MG tablet take 1 tablet by mouth once daily 90 tablet 2  . Calcium Carbonate-Vitamin D (CALCIUM 600+D) 600-400 MG-UNIT per tablet Take 1 tablet by mouth 2 (two) times daily.    . diclofenac sodium (VOLTAREN) 1 % GEL Apply 1  application topically 4 (four) times daily.  0  . diphenhydrAMINE (BENADRYL) 25 MG tablet Take 25 mg by mouth every 6 (six) hours as needed. For allergies    . EPINEPHrine (ADRENACLICK) 0.3 VV/6.1 mL IJ SOAJ injection Inject 0.3 mLs (0.3 mg total) into the muscle once. 1 Device 1  . fentaNYL (DURAGESIC - DOSED MCG/HR) 12 MCG/HR Place 1 patch (12.5 mcg total) onto the skin every 3 (three) days. 10 patch 0  . hydrochlorothiazide (HYDRODIURIL) 25 MG tablet take 1 tablet by mouth every morning for blood pressure 90 tablet 1  . mirtazapine (REMERON) 30 MG tablet Take 1 tablet (30 mg total) by mouth at bedtime. 30 tablet 1  . montelukast (SINGULAIR) 10 MG tablet Take 1 tablet (10 mg total) by mouth at bedtime. 90 tablet 3  . omeprazole (PRILOSEC) 20 MG capsule Take 20 mg by mouth daily.  0  . ondansetron (ZOFRAN) 4 MG tablet Take 1 tablet (4 mg total) by mouth every 8 (eight) hours as needed for nausea or vomiting. 20 tablet 12  . Oxycodone HCl 10 MG TABS Take 1 tablet (10 mg total) by mouth 3 (three) times daily as needed (for pain). 90 tablet 0  . polyethylene glycol (MIRALAX / GLYCOLAX) packet Take 17 g by mouth daily. 14 each 0  . potassium chloride SA (K-DUR,KLOR-CON) 20 MEQ tablet Take 1 tablet (20 mEq total) by mouth daily. 90 tablet 1  . simvastatin (ZOCOR) 10 MG tablet TAKE 1 TABLET BY MOUTH ONCE DAILY 90 tablet 0  . zoledronic acid (RECLAST) 5 MG/100ML SOLN Inject 5 mg into the vein. Once yearly. In June    . amoxicillin (AMOXIL) 500 MG capsule Take 1 capsule (500 mg total) by mouth 3 (three) times daily. 30 capsule 0  . metroNIDAZOLE (METROGEL) 0.75 % gel Apply 1 application topically 2 (two) times daily. 45 g 3  . mupirocin ointment (BACTROBAN) 2 % Apply to affected area TID for 7 days. 30 g 3   No current facility-administered medications for this visit.    Allergies  Allergen Reactions  . Bee Venom Anaphylaxis    Has epi-pen  . Ceftin [Cefuroxime Axetil] Anaphylaxis  . Ciprofloxacin  Anaphylaxis and Palpitations  . Ephedrine Other (See Comments) and Palpitations    "knocks me out"  . Sulfonamide Derivatives Anaphylaxis  . Telithromycin Anaphylaxis    Reaction: also blurred vision   . Valsartan Anaphylaxis  . Verapamil Anaphylaxis  . Venlafaxine Other (See Comments)    Insomnia, panic  . Beta Adrenergic Blockers     bradycardia  . Cephalosporins     Allergic Reaction  . Clindamycin/Lincomycin     Dry skin and itching/ Cdiff  . Cymbalta [Duloxetine Hcl] Other (See Comments)    Reaction:Confusion and " I fall down"  . Doxazosin     "blurred vision and irritable"  . Gabapentin Other (See  Comments)    Reaction: headache  . Hydralazine     HA, Diarrhea  . Lamotrigine     Patient unsure at this time  . Morphine Other (See Comments)    Medication has no effect with pain  . Oxycodone-Acetaminophen Hives    Takes plain oxy IR at home (May 2018)  . Pregabalin Swelling  . Prochlorperazine Edisylate   . Pseudoephedrine Other (See Comments)    Reaction: hyperventilates and blacks out  . Relafen [Nabumetone] Swelling  . Zoloft [Sertraline] Other (See Comments)    "nervous/jittery"  . Doxycycline Hives    Health Maintenance Health Maintenance  Topic Date Due  . TETANUS/TDAP  02/20/2020  . INFLUENZA VACCINE  Completed  . DEXA SCAN  Completed  . PNA vac Low Risk Adult  Completed     Exam:  BP 131/68   Pulse (!) 104   Wt 156 lb (70.8 kg)   SpO2 96%   BMI 30.47 kg/m  Gen: Well NAD HEENT: EOMI,  MMM  Lungs: Normal work of breathing. CTABL Heart: RRR no MRG Abd: NABS, Soft. Nondistended, Nontender Exts: Brisk capillary refill, warm and well perfused.  Skin: Face: Macular mild erythema malar areas of face nontender. Left helix of the ear small ulcerated area surrounding erythema mildly tender to touch. No discharge. MSK: Significant thoracic kyphosis. Perispinal muscles along the spine or tender. Psych: Alert and oriented tearful affect. Normal speech  and thought process.   Results for orders placed or performed in visit on 04/10/17 (from the past 72 hour(s))  POCT urinalysis dipstick     Status: Abnormal   Collection Time: 04/11/17 10:04 AM  Result Value Ref Range   Color, UA yellow    Clarity, UA clear    Glucose, UA negative    Bilirubin, UA negative    Ketones, UA negative    Spec Grav, UA <=1.005 (A) 1.010 - 1.025   Blood, UA negative    pH, UA 5.5 5.0 - 8.0   Protein, UA negative    Urobilinogen, UA 0.2 0.2 or 1.0 E.U./dL   Nitrite, UA negative    Leukocytes, UA Negative Negative   No results found.    Assessment and Plan: 83 y.o. female with  Anxiety: Not well controlled. Patient has a follow-up appointment with psychiatry soon. Plan to increase Xanax to 3 times daily as well as follow-up with psychiatry. Recheck with me in one month.  Skin of left ear: Patient appears to have some cellulitis however there may be an initial scrape or small basal cell carcinoma. Plan to recheck in 1 month if the ulcerated area still present will obtain a shave biopsy. Will treat empirically now for cellulitis with mupirocin antibiotic ointment and amoxicillin. Patient has multiple drug allergies making antibiotic choice difficult.  Face burning: Unclear etiology concerning for rosacea empiric treatment with MetroGel.  Chronic pain: Decrease oxycodone dose to 90 pills per month. Continue fentanyl recheck in one month.  Patient researched [facility] Controlled Substance Reporting System.    Orders Placed This Encounter  Procedures  . POCT urinalysis dipstick   Meds ordered this encounter  Medications  . ALPRAZolam (XANAX) 0.5 MG tablet    Sig: Take 1 tablet (0.5 mg total) by mouth 3 (three) times daily as needed for anxiety.    Dispense:  90 tablet    Refill:  3  . Oxycodone HCl 10 MG TABS    Sig: Take 1 tablet (10 mg total) by mouth 3 (three) times daily  as needed (for pain).    Dispense:  90 tablet    Refill:  0  .  fentaNYL (DURAGESIC - DOSED MCG/HR) 12 MCG/HR    Sig: Place 1 patch (12.5 mcg total) onto the skin every 3 (three) days.    Dispense:  10 patch    Refill:  0  . amoxicillin (AMOXIL) 500 MG capsule    Sig: Take 1 capsule (500 mg total) by mouth 3 (three) times daily.    Dispense:  30 capsule    Refill:  0  . mupirocin ointment (BACTROBAN) 2 %    Sig: Apply to affected area TID for 7 days.    Dispense:  30 g    Refill:  3  . metroNIDAZOLE (METROGEL) 0.75 % gel    Sig: Apply 1 application topically 2 (two) times daily.    Dispense:  45 g    Refill:  3     Discussed warning signs or symptoms. Please see discharge instructions. Patient expresses understanding.

## 2017-04-11 LAB — POCT URINALYSIS DIPSTICK
Bilirubin, UA: NEGATIVE
Glucose, UA: NEGATIVE
Ketones, UA: NEGATIVE
Leukocytes, UA: NEGATIVE
NITRITE UA: NEGATIVE
PH UA: 5.5 (ref 5.0–8.0)
PROTEIN UA: NEGATIVE
RBC UA: NEGATIVE
UROBILINOGEN UA: 0.2 U/dL

## 2017-04-12 ENCOUNTER — Telehealth: Payer: Self-pay | Admitting: Family Medicine

## 2017-04-12 NOTE — Telephone Encounter (Signed)
Pt advised, verbalized understanding. No further questions.  

## 2017-04-12 NOTE — Telephone Encounter (Signed)
Pt called clinic today stating she started her mupirocin ointment on Monday, as well as the antibiotic for her ear pain. Pt reports last night she was able to sleep on the affected ear with almost no pain for most of the night. Pt reports the ear still has "some roughness to it" which was further described as "scaley red patches." Pt reports the pain overall is "better." Questions if she should continue treatment plan for the full 7 days, or if PCP had thought of a "better plan."   Will route to PCP for review and recommendation.

## 2017-04-12 NOTE — Telephone Encounter (Signed)
She can stop the cream and oral Antibiotic

## 2017-04-18 ENCOUNTER — Other Ambulatory Visit: Payer: Self-pay

## 2017-04-18 ENCOUNTER — Telehealth: Payer: Self-pay

## 2017-04-18 MED ORDER — EPINEPHRINE 0.3 MG/0.3ML IJ SOAJ: 0.3000 mg | Freq: Once | INTRAMUSCULAR | 1 refills | Status: AC

## 2017-04-18 MED ORDER — DICLOFENAC SODIUM 1 % TD GEL: 4.0000 g | Freq: Four times a day (QID) | TRANSDERMAL | 11 refills | Status: DC

## 2017-04-18 NOTE — Telephone Encounter (Signed)
Rx sent 

## 2017-04-18 NOTE — Telephone Encounter (Signed)
Notified patient of Voltaren, and she needs her Epi-pen refilled, it expires today.  I sent that script.

## 2017-04-18 NOTE — Telephone Encounter (Signed)
Pt wanted to know if she can have a refill on the Voltaren Gel?  This was prescribed by another physician, but is asking for you to refill.  If ok, needs to be sent to [facility] on [facility].  Please advise.

## 2017-04-18 NOTE — Patient Outreach (Signed)
[facility]) Care Management  [place]  04/18/2017   KIRSTINE JACQUIN 1933/02/18 [id number]  Subjective: Telephone call to patient for assessment follow up. HIPAA verified with patient. Patient states she is doing fair. Patient reports she was unable to go to her orthopedic appointment on yesterday. Patient states her daughter was unable to take her due to inclement weather. Patient states she has rescheduled the appointment for 04/25/17.  Patient states she still has pain in her wrist. Patient states her pain level today in her wrist is a 2.  Patient states her pain has gotten better. Patient states she wears her wrist brace as needed for increase pain. Patient states her occupational home therapy stopped approximately 2 weeks ago.  Patient reports she continues to take her pain medication as needed.  States she is able to use her wrist minimally. Patient states she saw her new psychiatrist, Dr. De Nurse and has a follow up with him on 04/26/17.  Patient reports she is pleased with this new doctor.  Patient states she is currently taking Remeron 30mg  1 time per day at night.  Patient reports she is still having chronic pain in her back. Patient states the occupational therapist took the extenders off of her Menees. Patient states her back started hurting more over the last few days because her back is a little out of line due to the extenders being off of the Ebner. Patient states her daughter adjusted her Fini on yesterday which has helped with the pain in her back. Patient report her back pain level is a 6 today.  Patient denies any falls since last telephone outreach with [facility].  Patient reports she has an appointment with her primary MD on 04/20/17 due to her ear. Patient states she was seen by Dr. Georgina Snell approximately 1 week ago due to an area on her ear.  Patient states she used the cream that was prescribed and the area has gotten better.  RNCM advised patient to continue  to take her medications as prescribed. Report any increase in pain symptoms to her doctor. RNCM discussed with patient how to contact her doctors after hours. Patient knows to call 911 for emergent symptoms.  Patient verbally agreed to next telephone follow up with RNCM.    Encounter Medications:  Outpatient Encounter Prescriptions as of 04/18/2017  Medication Sig  . ALPRAZolam (XANAX) 0.5 MG tablet Take 1 tablet (0.5 mg total) by mouth 3 (three) times daily as needed for anxiety.  . AMBULATORY NON FORMULARY MEDICATION Rolling [place].  Dx: Rheumatoid Arthritis.  Use daily as needed for ambulation.  [name] amLODipine (NORVASC) 10 MG tablet take 1 tablet by mouth once daily  . Calcium Carbonate-Vitamin D (CALCIUM 600+D) 600-400 MG-UNIT per tablet Take 1 tablet by mouth 2 (two) times daily.  . diclofenac sodium (VOLTAREN) 1 % GEL Apply 1 application topically 4 (four) times daily.  . diphenhydrAMINE (BENADRYL) 25 MG tablet Take 25 mg by mouth every 6 (six) hours as needed. For allergies  . EPINEPHrine (ADRENACLICK) 0.3 XT/0.2 mL IJ SOAJ injection Inject 0.3 mLs (0.3 mg total) into the muscle once.  . fentaNYL (DURAGESIC - DOSED MCG/HR) 12 MCG/HR Place 1 patch (12.5 mcg total) onto the skin every 3 (three) days.  . hydrochlorothiazide (HYDRODIURIL) 25 MG tablet take 1 tablet by mouth every morning for blood pressure  . metroNIDAZOLE (METROGEL) 0.75 % gel Apply 1 application topically 2 (two) times daily.  . mirtazapine (REMERON) 30 MG tablet Take 1 tablet (  30 mg total) by mouth at bedtime.  . montelukast (SINGULAIR) 10 MG tablet Take 1 tablet (10 mg total) by mouth at bedtime.  [name] omeprazole (PRILOSEC) 20 MG capsule Take 20 mg by mouth daily.  . ondansetron (ZOFRAN) 4 MG tablet Take 1 tablet (4 mg total) by mouth every 8 (eight) hours as needed for nausea or vomiting.  . Oxycodone HCl 10 MG TABS Take 1 tablet (10 mg total) by mouth 3 (three) times daily as needed (for pain).  . polyethylene glycol  (MIRALAX / GLYCOLAX) packet Take 17 g by mouth daily.  . potassium chloride SA (K-DUR,KLOR-CON) 20 MEQ tablet Take 1 tablet (20 mEq total) by mouth daily.  . simvastatin (ZOCOR) 10 MG tablet TAKE 1 TABLET BY MOUTH ONCE DAILY  . zoledronic acid (RECLAST) 5 MG/100ML SOLN Inject 5 mg into the vein. Once yearly. In June  . amoxicillin (AMOXIL) 500 MG capsule Take 1 capsule (500 mg total) by mouth 3 (three) times daily. (Patient not taking: Reported on 04/18/2017)  . mupirocin ointment (BACTROBAN) 2 % Apply to affected area TID for 7 days. (Patient not taking: Reported on 04/18/2017)   No facility-administered encounter medications on file as of 04/18/2017.     Functional Status:  In your present state of health, do you have any difficulty performing the following activities: 01/05/2017 12/20/2016  Hearing? Y -  Comment wears hearing aid -  Vision? N -  Difficulty concentrating or making decisions? N -  Walking or climbing stairs? Y -  Comment - -  Dressing or bathing? Y -  Doing errands, shopping? Y N  Preparing Food and eating ? Y -  Using the Toilet? N -  In the past six months, have you accidently leaked urine? Y -  Comment - -  Do you have problems with loss of bowel control? N -  Managing your Medications? N -  Managing your Finances? Y -  Housekeeping or managing your Housekeeping? Y -  Comment - -  Some recent data might be hidden    Fall/Depression Screening: Fall Risk  01/05/2017 09/07/2016 09/02/2016  Falls in the past year? Yes No No  Number falls in past yr: 1 - -  Injury with Fall? Yes - -  Risk Factor Category  High Fall Risk - -  Risk for fall due to : History of fall(s) - -  Follow up Falls prevention discussed - -   PHQ 2/9 Scores 03/13/2017 02/15/2017 02/09/2017 01/26/2017 01/05/2017 09/07/2016 09/02/2016  PHQ - 2 Score 4 6 6 3 4 3 4   PHQ- 9 Score 14 24 20 13 12 8 17   Exception Documentation - - - - - - -  Some encounter information is confidential and restricted. Go to Review  Flowsheets activity to see all data.    Assessment: Ongoing follow up with patient. Patient appears to be self managing care. Continues with chronic pain.  Verbalizes periods of pain decreasing.  Patient continues to take her pain medications as prescribed and follow up with her orthopedic doctor.   Plan: RNCM will follow up with patient within the month of October 2018. RNCM will send updated involvement letter to patients primary MD.   Quinn Plowman RN,BSN,CCM [facility] Telephonic  [phone]

## 2017-04-20 ENCOUNTER — Ambulatory Visit (INDEPENDENT_AMBULATORY_CARE_PROVIDER_SITE_OTHER): Payer: Medicare Other | Admitting: Family Medicine

## 2017-04-20 ENCOUNTER — Encounter: Payer: Self-pay | Admitting: Family Medicine

## 2017-04-20 VITALS — BP 144/77 | HR 108 | Ht 60.0 in | Wt 147.0 lb

## 2017-04-20 DIAGNOSIS — L989 Disorder of the skin and subcutaneous tissue, unspecified: Secondary | ICD-10-CM | POA: Diagnosis not present

## 2017-04-20 DIAGNOSIS — B070 Plantar wart: Secondary | ICD-10-CM | POA: Diagnosis not present

## 2017-04-20 DIAGNOSIS — H6192 Disorder of left external ear, unspecified: Secondary | ICD-10-CM

## 2017-04-20 DIAGNOSIS — L859 Epidermal thickening, unspecified: Secondary | ICD-10-CM | POA: Diagnosis not present

## 2017-04-20 NOTE — Patient Instructions (Addendum)
Thank you for coming in today. You should hear about the skin pathology results in about 1 week.  Recheck on Oct 15th. Return sooner if needed.    Basal Cell Carcinoma Basal cell carcinoma is the most common form of skin cancer. It begins in the basal cells, which are at the bottom of the outer skin layer (epidermis). It occurs most often on parts of the body that are frequently exposed to the sun, such as:  Parts of the head, including the scalp or face.  Ears.  Neck.  Arms or legs.  Backs of the hands.  Basal cell carcinoma can almost always be cured. It rarely spreads to other areas of the body (metastasizes). Basal cell carcinoma may come back at the same location (recur), but it can be treated again if this occurs. What are the causes? This condition is usually caused by exposure to ultraviolet (UV) light. UV light may come from the sun or from tanning beds. Other causes include:  Exposure to arsenic.  Exposure to radiation.  Exposure to toxic tars and oils.  Certain genetic conditions, such as xeroderma pigmentosum.  What increases the risk? This condition is more likely to develop in:  People who are older than 81 years of age.  People who have fair skin (light complexion).  People who have blonde or red hair.  People who have blue, green, or gray eyes.  People who have childhood freckling.  People who have had sun exposure over long periods of time, especially during childhood.  People who have had repeated sunburns.  People who have a weakened immune system.  People who have been exposed to certain chemicals, such as tar, soot, and arsenic.  People who have chronic inflammatory conditions.  People who have chronic infections.  People who use tanning beds.  What are the signs or symptoms? The main symptom of this condition is a growth or lesion on the skin. The shape and color of the growth or lesion may vary. The five main types include:  An open  sore that may remain open for 3 weeks or longer. The sore may bleed or crust. This type of lesion can be an early sign of basal cell carcinoma. Basal cell carcinoma often shows up as a sore that does not heal.  A reddish area that may crust, itch, or cause discomfort. This may occur on areas that are exposed to the sun. These patches might be easier to feel than to see.  A shiny or clear bump that is red, white, or pink. In people who have dark hair, the bump is often tan, black, or brown. These bumps can look like moles.  A pink growth with a raised border. The growth will have a crusted and indented area in the center. Small blood vessels may appear on the surface of the growth as it gets bigger.  A scarlike area that looks like shiny, stretched skin. The area may be white, yellow, or waxy. It often has irregular borders. This may be a sign of more aggressive basal cell carcinoma.  How is this diagnosed? This condition may be diagnosed with:  A physical exam.  Removal of a tissue sample to be examined under a microscope (biopsy).  How is this treated? Treatment for this condition involves removing the cancerous tissue. The method that is used for this depends on the type, size, location, and number of tumors. Possible treatments include:  Mohs surgery. In this procedure, the cancerous skin cells are  removed layer by layer until all of the tumor has been removed.  Surgical removal (excision) of the tumor. This involves removing the entire tumor and a small amount of normal skin that surrounds it.  Cryosurgery. This involves freezing the tumor with liquid nitrogen.  Plastic surgery. The tumor is removed, and healthy skin from another part of the body is used to cover the wound. This may be done for large tumors that are in areas where it is not possible to stretch the nearby skin to sew the edges of the wound together.  Radiation. This may be used for tumors on the face.  Photodynamic  therapy. A chemical cream is applied to the skin, and light exposure is used to activate the chemical.  Electrodesiccation and curettage. This involves alternately scraping and burning the tumor while using an electric current to control bleeding.  Chemical treatments, such as imiquimod cream and interferon injections. These may be used to remove superficial tumors with minimal scarring.  Follow these instructions at home:  Avoid unprotected sun exposure.  Do self-exams as told by your health care provider. Look for new spots or changes in your skin.  Keep all follow-up visits as told by your health care provider. This is important. How is this prevented?  Avoid the sun when it is the strongest. This is usually between 10:00 a.m. and 4:00 p.m.  When you are out in the sun, use a sunscreen that has a sun protection factor (SPF) of at least 72.  Apply sunscreen at least 30 minutes before exposure to the sun.  Reapply sunscreen every 2-4 hours while you are outside. Also reapply it after swimming and after excessive sweating.  Always wear hats, protective clothing, and UV-blocking sunglasses when you are outdoors.  Do not use tanning beds. Contact a health care provider if:  You notice any new spots or any changes in your skin.  You have had a basal cell carcinoma tumor removed and you notice a new growth in the same location. This information is not intended to replace advice given to you by your health care provider. Make sure you discuss any questions you have with your health care provider. Document Released: 01/22/2003 Document Revised: 12/24/2015 Document Reviewed: 11/10/2014 Elsevier Interactive Patient Education  [employer].

## 2017-04-20 NOTE — Progress Notes (Signed)
[name] is a 83 y.o. female who presents to [facility] today for follow-up of ear lesion and corn on left foot.  Ear lesion: Patient reports decreased pain with amoxicillin and application of mupirocin antibiotic cream. She cannot say whether it looks better or about the same as previously. Patient denies any personal history of skin cancer. Patient denies any itching of area.  Corn on left foot: Patient has a small area of hardened skin on her left foot. She feels that it is most likely a corn. She says it comes and goes, but has been more bothersome recently. She is usually able to file this area and other similar areas down but is unable to do so with this area. Patient denies any pain and states area is more of a nuisance than anything.    Past Medical History:  Diagnosis Date  . Anxiety   . Arthritis    osteoarthritis  . Asthma   . C. difficile diarrhea   . Candida esophagitis ([place])   . Chronic sinusitis   . Depression   . Depression   . Diverticulosis   . DJD (degenerative joint disease)     L5 compression fracture  . Edema extremities   . Fibromyalgia   . GERD (gastroesophageal reflux disease)   . Gout   . Hyperlipidemia   . Hypertension   . Osteoarthritis   . Osteopenia   . Panic disorder   . Renal failure, unspecified   . Spinal stenosis of lumbar region   . Tubular adenoma of colon   . Wrist fracture, left    Past Surgical History:  Procedure Laterality Date  . ADENOIDECTOMY    . CARPAL TUNNEL RELEASE  2007, 2009   Bilateral  . CATARACT EXTRACTION, BILATERAL    . CHOANAL ADENIODECTOMY    . DIAGNOSTIC LARYNGOSCOPY N/A 11/05/2015   Procedure: DIAGNOSTIC LARYNGOSCOPY AND ESOPHAGOSCOPY;  Surgeon: Rozetta Nunnery, MD;  Location: [facility];  Service: ENT;  Laterality: N/A;  . ganglion cyct removal on fingers     right  . ganglion cyst   wrist    . KNEE ARTHROSCOPY     right  . NASAL SINUS SURGERY    . OPEN REDUCTION INTERNAL FIXATION (ORIF) DISTAL RADIAL FRACTURE Left 12/18/2016   Procedure: OPEN REDUCTION INTERNAL FIXATION (ORIF) DISTAL RADIAL FRACTURE;  Surgeon: Iran Planas, MD;  Location: [place];  Service: Orthopedics;  Laterality: Left;  . SHOULDER SURGERY  2004, 07/30/10, 01/2011   after her right humeral neck fracture, revision hemiarthroplasty 2004 for persistent pain, revision reverse arthroplasty December 2011 for persistent pain, incision and drainage with poly-exchange 01/26/2011 for possible prosthetic joint infection.  . TONSILLECTOMY AND ADENOIDECTOMY    . TOTAL ABDOMINAL HYSTERECTOMY    . TOTAL HIP ARTHROPLASTY  2004   right  . TOTAL KNEE ARTHROPLASTY  2006   right  . TOTAL SHOULDER REPLACEMENT  2002   right   Social History  Substance Use Topics  . Smoking status: Never Smoker  . Smokeless tobacco: Never Used  . Alcohol use No   family history includes Bladder Cancer in her father; Breast cancer in her unknown relative; Colon polyps in her unknown relative; Depression in her son; Diabetes in her mother; Hypertension in her mother.  ROS as above:  Medications: Current Outpatient Prescriptions  Medication Sig Dispense Refill  . ALPRAZolam (XANAX) 0.5 MG tablet Take 1 tablet (0.5 mg total) by mouth  3 (three) times daily as needed for anxiety. 90 tablet 3  . AMBULATORY NON FORMULARY MEDICATION Rolling [place].  Dx: Rheumatoid Arthritis.  Use daily as needed for ambulation. 1 Units 0  . amLODipine (NORVASC) 10 MG tablet take 1 tablet by mouth once daily 90 tablet 2  . Calcium Carbonate-Vitamin D (CALCIUM 600+D) 600-400 MG-UNIT per tablet Take 1 tablet by mouth 2 (two) times daily.    . diclofenac sodium (VOLTAREN) 1 % GEL Apply 4 g topically 4 (four) times daily. To affected joint. 100 g 11  . diphenhydrAMINE (BENADRYL) 25 MG tablet Take 25 mg by mouth every 6 (six) hours as needed. For allergies    .  fentaNYL (DURAGESIC - DOSED MCG/HR) 12 MCG/HR Place 1 patch (12.5 mcg total) onto the skin every 3 (three) days. 10 patch 0  . hydrochlorothiazide (HYDRODIURIL) 25 MG tablet take 1 tablet by mouth every morning for blood pressure 90 tablet 1  . metroNIDAZOLE (METROGEL) 0.75 % gel Apply 1 application topically 2 (two) times daily. 45 g 3  . mirtazapine (REMERON) 30 MG tablet Take 1 tablet (30 mg total) by mouth at bedtime. 30 tablet 1  . montelukast (SINGULAIR) 10 MG tablet Take 1 tablet (10 mg total) by mouth at bedtime. 90 tablet 3  . mupirocin ointment (BACTROBAN) 2 % Apply to affected area TID for 7 days. 30 g 3  . omeprazole (PRILOSEC) 20 MG capsule Take 20 mg by mouth daily.  0  . ondansetron (ZOFRAN) 4 MG tablet Take 1 tablet (4 mg total) by mouth every 8 (eight) hours as needed for nausea or vomiting. 20 tablet 12  . Oxycodone HCl 10 MG TABS Take 1 tablet (10 mg total) by mouth 3 (three) times daily as needed (for pain). 90 tablet 0  . polyethylene glycol (MIRALAX / GLYCOLAX) packet Take 17 g by mouth daily. 14 each 0  . potassium chloride SA (K-DUR,KLOR-CON) 20 MEQ tablet Take 1 tablet (20 mEq total) by mouth daily. 90 tablet 1  . simvastatin (ZOCOR) 10 MG tablet TAKE 1 TABLET BY MOUTH ONCE DAILY 90 tablet 0  . zoledronic acid (RECLAST) 5 MG/100ML SOLN Inject 5 mg into the vein. Once yearly. In June     No current facility-administered medications for this visit.    Allergies  Allergen Reactions  . Bee Venom Anaphylaxis    Has epi-pen  . Ceftin [Cefuroxime Axetil] Anaphylaxis  . Ciprofloxacin Anaphylaxis and Palpitations  . Ephedrine Other (See Comments) and Palpitations    "knocks me out"  . Sulfonamide Derivatives Anaphylaxis  . Telithromycin Anaphylaxis    Reaction: also blurred vision   . Valsartan Anaphylaxis  . Verapamil Anaphylaxis  . Venlafaxine Other (See Comments)    Insomnia, panic  . Beta Adrenergic Blockers     bradycardia  . Cephalosporins     Allergic Reaction   . Clindamycin/Lincomycin     Dry skin and itching/ Cdiff  . Cymbalta [Duloxetine Hcl] Other (See Comments)    Reaction:Confusion and " I fall down"  . Doxazosin     "blurred vision and irritable"  . Gabapentin Other (See Comments)    Reaction: headache  . Hydralazine     HA, Diarrhea  . Lamotrigine     Patient unsure at this time  . Morphine Other (See Comments)    Medication has no effect with pain  . Oxycodone-Acetaminophen Hives    Takes plain oxy IR at home (May 2018)  . Pregabalin Swelling  . Prochlorperazine Edisylate   .  Pseudoephedrine Other (See Comments)    Reaction: hyperventilates and blacks out  . Relafen [Nabumetone] Swelling  . Zoloft [Sertraline] Other (See Comments)    "nervous/jittery"  . Doxycycline Hives    Health Maintenance Health Maintenance  Topic Date Due  . TETANUS/TDAP  02/20/2020  . INFLUENZA VACCINE  Completed  . DEXA SCAN  Completed  . PNA vac Low Risk Adult  Completed     Exam:  BP (!) 144/77   Pulse (!) 108   Ht 5' (1.524 m)   Wt 147 lb (66.7 kg)   BMI 28.71 kg/m  Gen: Well NAD, sitting comfortably in chair, mildly anxious HEENT: EOMI,  MMM, left ear has lesion with central ulceration on skin over anti-helix, there is erythema surrounding the area which spreads to helix, there is no tenderness to palpation Lungs: Normal work of breathing. CTABL Heart: RRR no MRG Abd: NABS, Soft. Nondistended, Nontender Exts: Brisk capillary refill, warm and well perfused, left foot has a corn present on lateral aspect   Shave biopsy left ear: Consent obtained and timeout performed. Skin cleaned with alcohol and cold spray applied. 0.5 mL of lidocaine was injected. Skin was again cleaned with alcohol and a shallow shave biopsy was obtained of the outside portion of the ulcerated area. A dressing was applied. Patient tolerated procedure well.  Paring of plantar wart: Consent obtained and timeout performed. Skin cleaned with alcohol and cold  spray applied. 0.5 mL of lidocaine was injected achieving good anesthesia. The skin was pared back avoiding bleeding.  No results found for this or any previous visit (from the past 72 hour(s)). No results found.    Assessment and Plan: 83 y.o. female presenting for follow-up of ear lesion and corn on left foot. Her ear lesion improved but did not resolve with amoxicillin and mupirocin cream. At this time further investigation is warranted. This lesion has some characteristics of basal cell carcinoma. A shave biopsy was done in clinic today.  For the corn on her left foot, the area of hardened skin was shaved off in clinic with a scalpel. Patient will continue to monitor area for improvement or recurrence and follow-up in clinic as needed.    No orders of the defined types were placed in this encounter.  No orders of the defined types were placed in this encounter.    Discussed warning signs or symptoms. Please see discharge instructions. Patient expresses understanding.  I spent 25 minutes with this patient, greater than 50% was face-to-face time counseling regarding ddx and treatment plan.

## 2017-04-25 DIAGNOSIS — S52572D Other intraarticular fracture of lower end of left radius, subsequent encounter for closed fracture with routine healing: Secondary | ICD-10-CM | POA: Diagnosis not present

## 2017-04-26 ENCOUNTER — Ambulatory Visit (INDEPENDENT_AMBULATORY_CARE_PROVIDER_SITE_OTHER): Payer: Medicare Other | Admitting: Psychiatry

## 2017-04-26 ENCOUNTER — Encounter (HOSPITAL_COMMUNITY): Payer: Self-pay | Admitting: Psychiatry

## 2017-04-26 ENCOUNTER — Ambulatory Visit (HOSPITAL_COMMUNITY): Payer: Self-pay | Admitting: Psychiatry

## 2017-04-26 VITALS — BP 140/84 | HR 96 | Resp 16 | Ht 60.0 in | Wt 148.0 lb

## 2017-04-26 DIAGNOSIS — Z818 Family history of other mental and behavioral disorders: Secondary | ICD-10-CM

## 2017-04-26 DIAGNOSIS — R450 Nervousness: Secondary | ICD-10-CM | POA: Diagnosis not present

## 2017-04-26 DIAGNOSIS — F419 Anxiety disorder, unspecified: Secondary | ICD-10-CM | POA: Diagnosis not present

## 2017-04-26 DIAGNOSIS — F4323 Adjustment disorder with mixed anxiety and depressed mood: Secondary | ICD-10-CM | POA: Diagnosis not present

## 2017-04-26 DIAGNOSIS — F324 Major depressive disorder, single episode, in partial remission: Secondary | ICD-10-CM

## 2017-04-26 DIAGNOSIS — Z599 Problem related to housing and economic circumstances, unspecified: Secondary | ICD-10-CM | POA: Diagnosis not present

## 2017-04-26 NOTE — Patient Instructions (Signed)
No change in medication  Take morning dose somewhat later after 2 hours of wake time to stretch it more for the rest of day

## 2017-04-26 NOTE — Progress Notes (Signed)
[facility] Outpatient Follow up visit   Patient Identification: [name] MRN:  [id number] Date of Evaluation:  04/26/2017 Referral Source: primary care Chief Complaint:   Chief Complaint    Follow-up     Visit Diagnosis:    ICD-10-CM   1. Major depressive disorder with single episode, in partial remission ([place]) F32.4   2. Adjustment disorder with mixed anxiety and depressed mood F43.23     History of Present Illness:  83 years old currently married Caucasian female initially  referred by primary care physician for management of depression  Patient is tolerating medications for anxiety she is getting Xanax from primary care physician not taking more than 3. She is also on mirtazapine that is helping the depression sleep. She goes to bed early. Her husband is sick she uses a [employer] she does have some support of the family members including daughters who visits by but overall she does get anxious and stressed easily because of difficulty dealing with her husband at times and his sickness finances as low so they had to move 3 years ago and she does not like the place where she is living.   Aggravating factors; relationship. Husband sick. Finances low saving Modifying factors; dogs Severity of depression: 6/10     Past Psychiatric History: depression. When her 4 th kid was born. She had to be hospitalized later for depression  Previous Psychotropic Medications: Yes   Substance Abuse History in the last 12 months:  No.  Consequences of Substance Abuse: NA  Past Medical History:  Past Medical History:  Diagnosis Date  . Anxiety   . Arthritis    osteoarthritis  . Asthma   . C. difficile diarrhea   . Candida esophagitis ([place])   . Chronic sinusitis   . Depression   . Depression   . Diverticulosis   . DJD (degenerative joint disease)     L5 compression fracture  . Edema extremities   . Fibromyalgia   . GERD (gastroesophageal reflux disease)   . Gout   . Hyperlipidemia    . Hypertension   . Osteoarthritis   . Osteopenia   . Panic disorder   . Renal failure, unspecified   . Spinal stenosis of lumbar region   . Tubular adenoma of colon   . Wrist fracture, left     Past Surgical History:  Procedure Laterality Date  . ADENOIDECTOMY    . CARPAL TUNNEL RELEASE  2007, 2009   Bilateral  . CATARACT EXTRACTION, BILATERAL    . CHOANAL ADENIODECTOMY    . DIAGNOSTIC LARYNGOSCOPY N/A 11/05/2015   Procedure: DIAGNOSTIC LARYNGOSCOPY AND ESOPHAGOSCOPY;  Surgeon: Rozetta Nunnery, MD;  Location: [facility];  Service: ENT;  Laterality: N/A;  . ganglion cyct removal on fingers     right  . ganglion cyst  wrist    . KNEE ARTHROSCOPY     right  . NASAL SINUS SURGERY    . OPEN REDUCTION INTERNAL FIXATION (ORIF) DISTAL RADIAL FRACTURE Left 12/18/2016   Procedure: OPEN REDUCTION INTERNAL FIXATION (ORIF) DISTAL RADIAL FRACTURE;  Surgeon: Iran Planas, MD;  Location: [place];  Service: Orthopedics;  Laterality: Left;  . SHOULDER SURGERY  2004, 07/30/10, 01/2011   after her right humeral neck fracture, revision hemiarthroplasty 2004 for persistent pain, revision reverse arthroplasty December 2011 for persistent pain, incision and drainage with poly-exchange 01/26/2011 for possible prosthetic joint infection.  . TONSILLECTOMY AND ADENOIDECTOMY    . TOTAL ABDOMINAL HYSTERECTOMY    . TOTAL HIP ARTHROPLASTY  2004   right  . TOTAL KNEE ARTHROPLASTY  2006   right  . TOTAL SHOULDER REPLACEMENT  2002   right    Family Psychiatric History: denies. But believe her mom may have had depression. Her son  Family History:  Family History  Problem Relation Age of Onset  . Bladder Cancer Father   . Diabetes Mother   . Hypertension Mother   . Breast cancer Unknown   . Colon polyps Unknown   . Depression Son     Social History:   Social History   Social History  . Marital status: Married    Spouse name: N/A  . Number of children: N/A  . Years of education: N/A   Social  History Main Topics  . Smoking status: Never Smoker  . Smokeless tobacco: Never Used  . Alcohol use No  . Drug use: No  . Sexual activity: No   Other Topics Concern  . None   Social History Narrative  . None     Allergies:   Allergies  Allergen Reactions  . Bee Venom Anaphylaxis    Has epi-pen  . Ceftin [Cefuroxime Axetil] Anaphylaxis  . Ciprofloxacin Anaphylaxis and Palpitations  . Ephedrine Other (See Comments) and Palpitations    "knocks me out"  . Sulfonamide Derivatives Anaphylaxis  . Telithromycin Anaphylaxis    Reaction: also blurred vision   . Valsartan Anaphylaxis  . Verapamil Anaphylaxis  . Venlafaxine Other (See Comments)    Insomnia, panic  . Beta Adrenergic Blockers     bradycardia  . Cephalosporins     Allergic Reaction  . Clindamycin/Lincomycin     Dry skin and itching/ Cdiff  . Cymbalta [Duloxetine Hcl] Other (See Comments)    Reaction:Confusion and " I fall down"  . Doxazosin     "blurred vision and irritable"  . Gabapentin Other (See Comments)    Reaction: headache  . Hydralazine     HA, Diarrhea  . Lamotrigine     Patient unsure at this time  . Morphine Other (See Comments)    Medication has no effect with pain  . Oxycodone-Acetaminophen Hives    Takes plain oxy IR at home (May 2018)  . Pregabalin Swelling  . Prochlorperazine Edisylate   . Pseudoephedrine Other (See Comments)    Reaction: hyperventilates and blacks out  . Relafen [Nabumetone] Swelling  . Zoloft [Sertraline] Other (See Comments)    "nervous/jittery"  . Doxycycline Hives    Metabolic Disorder Labs: Lab Results  Component Value Date   HGBA1C 6.1 (H) 02/10/2016   MPG 128 02/10/2016   MPG 131 (H) 05/26/2015   No results found for: PROLACTIN Lab Results  Component Value Date   CHOL 130 11/24/2015   TRIG 191 (H) 11/24/2015   HDL 29 (L) 11/24/2015   CHOLHDL 4.5 11/24/2015   VLDL 38 (H) 11/24/2015   LDLCALC 63 11/24/2015   LDLCALC 96 09/04/2014     Current  Medications: Current Outpatient Prescriptions  Medication Sig Dispense Refill  . ALPRAZolam (XANAX) 0.5 MG tablet Take 1 tablet (0.5 mg total) by mouth 3 (three) times daily as needed for anxiety. 90 tablet 3  . AMBULATORY NON FORMULARY MEDICATION [place].  Dx: Rheumatoid Arthritis.  Use daily as needed for ambulation. 1 Units 0  . amLODipine (NORVASC) 10 MG tablet take 1 tablet by mouth once daily 90 tablet 2  . Calcium Carbonate-Vitamin D (CALCIUM 600+D) 600-400 MG-UNIT per tablet Take 1 tablet by mouth 2 (two) times daily.    [name]  diclofenac sodium (VOLTAREN) 1 % GEL Apply 4 g topically 4 (four) times daily. To affected joint. 100 g 11  . diphenhydrAMINE (BENADRYL) 25 MG tablet Take 25 mg by mouth every 6 (six) hours as needed. For allergies    . fentaNYL (DURAGESIC - DOSED MCG/HR) 12 MCG/HR Place 1 patch (12.5 mcg total) onto the skin every 3 (three) days. 10 patch 0  . hydrochlorothiazide (HYDRODIURIL) 25 MG tablet take 1 tablet by mouth every morning for blood pressure 90 tablet 1  . metroNIDAZOLE (METROGEL) 0.75 % gel Apply 1 application topically 2 (two) times daily. 45 g 3  . mirtazapine (REMERON) 30 MG tablet Take 1 tablet (30 mg total) by mouth at bedtime. 30 tablet 1  . montelukast (SINGULAIR) 10 MG tablet Take 1 tablet (10 mg total) by mouth at bedtime. 90 tablet 3  . mupirocin ointment (BACTROBAN) 2 % Apply to affected area TID for 7 days. 30 g 3  . omeprazole (PRILOSEC) 20 MG capsule Take 20 mg by mouth daily.  0  . ondansetron (ZOFRAN) 4 MG tablet Take 1 tablet (4 mg total) by mouth every 8 (eight) hours as needed for nausea or vomiting. 20 tablet 12  . Oxycodone HCl 10 MG TABS Take 1 tablet (10 mg total) by mouth 3 (three) times daily as needed (for pain). 90 tablet 0  . polyethylene glycol (MIRALAX / GLYCOLAX) packet Take 17 g by mouth daily. 14 each 0  . potassium chloride SA (K-DUR,KLOR-CON) 20 MEQ tablet Take 1 tablet (20 mEq total) by mouth daily. 90 tablet 1  .  simvastatin (ZOCOR) 10 MG tablet TAKE 1 TABLET BY MOUTH ONCE DAILY 90 tablet 0  . zoledronic acid (RECLAST) 5 MG/100ML SOLN Inject 5 mg into the vein. Once yearly. In June     No current facility-administered medications for this visit.       Psychiatric Specialty Exam: Review of Systems  Cardiovascular: Negative for chest pain and palpitations.  Skin: Negative for rash.  Psychiatric/Behavioral: Negative for suicidal ideas. The patient is nervous/anxious.     Blood pressure 140/84, pulse 96, resp. rate 16, height 5' (1.524 m), weight 148 lb (67.1 kg), SpO2 92 %.Body mass index is 28.9 kg/m.  General Appearance: Casual  Eye Contact:  Fair  Speech:  Normal Rate  Volume:  Decreased  Mood:  Somewhat stressed  Affect:  congruent  Thought Process:  Goal Directed  Orientation:  Full (Time, Place, and Person)  Thought Content:  Rumination  Suicidal Thoughts:  No  Homicidal Thoughts:  No  Memory:  Immediate;   Fair Recent;   Fair  Judgement:  Fair  Insight:  Fair  Psychomotor Activity:  Decreased  Concentration:  Concentration: Fair and Attention Span: Fair  Recall:  [employer] of Knowledge:Fair  Language: Fair  Akathisia:  Negative  Handed:  Right  AIMS (if indicated):    Assets:  Desire for Improvement  ADL's:  Intact  Cognition: WNL  Sleep:  fair    Treatment Plan Summary: Medication management and Plan as follows  1. Major depression recurrent, not worse. But worries. Continue remeron   2.  Generalized anxiety disorder; avoid early morning xanax so can stretch for later part . Carry half if needed prn . Work on Neurosurgeon. Would avoid adding more as it may lead to fatigue and memory concerns. Provided supportive therapy  3. Mood disorder relavant to phsyical condition and medical condition : provided support. Follow up with providers and distract from  worries.  Questions addressed. Continue to remain positive and encouraged activities. FU 3-4 months. meds  being prescribed by primary care  Merian Capron, MD 9/26/201810:11 AM

## 2017-05-10 ENCOUNTER — Other Ambulatory Visit: Payer: Self-pay | Admitting: Physician Assistant

## 2017-05-10 ENCOUNTER — Other Ambulatory Visit: Payer: Self-pay | Admitting: Family Medicine

## 2017-05-15 ENCOUNTER — Encounter: Payer: Self-pay | Admitting: Family Medicine

## 2017-05-15 ENCOUNTER — Ambulatory Visit (INDEPENDENT_AMBULATORY_CARE_PROVIDER_SITE_OTHER): Payer: Medicare Other | Admitting: Family Medicine

## 2017-05-15 VITALS — BP 125/71 | HR 98 | Wt 149.0 lb

## 2017-05-15 DIAGNOSIS — F324 Major depressive disorder, single episode, in partial remission: Secondary | ICD-10-CM | POA: Diagnosis not present

## 2017-05-15 DIAGNOSIS — F419 Anxiety disorder, unspecified: Secondary | ICD-10-CM

## 2017-05-15 DIAGNOSIS — M545 Low back pain, unspecified: Secondary | ICD-10-CM

## 2017-05-15 DIAGNOSIS — M069 Rheumatoid arthritis, unspecified: Secondary | ICD-10-CM | POA: Diagnosis not present

## 2017-05-15 DIAGNOSIS — F32A Depression, unspecified: Secondary | ICD-10-CM

## 2017-05-15 DIAGNOSIS — F329 Major depressive disorder, single episode, unspecified: Secondary | ICD-10-CM | POA: Diagnosis not present

## 2017-05-15 DIAGNOSIS — S62102D Fracture of unspecified carpal bone, left wrist, subsequent encounter for fracture with routine healing: Secondary | ICD-10-CM

## 2017-05-15 MED ORDER — FENTANYL 12 MCG/HR TD PT72: 12.5000 ug | MEDICATED_PATCH | TRANSDERMAL | 0 refills | Status: DC

## 2017-05-15 NOTE — Progress Notes (Signed)
[name] is a 83 y.o. female who presents to [place]: [place] today for follow-up of anxiety, left ear lesion, and wrist pain.   Anxiety: Patient reports continued anxiety and jitteriness. She is unable to identify anything in particular that increases her anxiety. She feels that her anxiety is better controlled than previously. She does not wish to change medications or see any counselors to discuss anxiety further.  Left ear lesion: Patient reports slow healing following excision of lesion at last visit. She denies any pain or itchiness surrounding the area. Patient has been covering the area with a  Band-aid at night because she tends to sleep on that side. She has applied mupirocin ointment to the area, but stopped recently because she felt it had healed well.  Wrist pain: Patient continues to experience left wrist pain. She does not want an injection at this time. Patient states the pain is manageable and she uses diclofenac gel as needed.    Past Medical History:  Diagnosis Date  . Anxiety   . Arthritis    osteoarthritis  . Asthma   . C. difficile diarrhea   . Candida esophagitis ([place])   . Chronic sinusitis   . Depression   . Depression   . Diverticulosis   . DJD (degenerative joint disease)     L5 compression fracture  . Edema extremities   . Fibromyalgia   . GERD (gastroesophageal reflux disease)   . Gout   . Hyperlipidemia   . Hypertension   . Osteoarthritis   . Osteopenia   . Panic disorder   . Renal failure, unspecified   . Spinal stenosis of lumbar region   . Tubular adenoma of colon   . Wrist fracture, left    Past Surgical History:  Procedure Laterality Date  . ADENOIDECTOMY    . CARPAL TUNNEL RELEASE  2007, 2009   Bilateral  . CATARACT EXTRACTION, BILATERAL    . CHOANAL ADENIODECTOMY    . DIAGNOSTIC LARYNGOSCOPY N/A 11/05/2015   Procedure: DIAGNOSTIC LARYNGOSCOPY AND ESOPHAGOSCOPY;  Surgeon: Rozetta Nunnery, MD;  Location: [facility];  Service: ENT;  Laterality: N/A;  . ganglion cyct removal on fingers     right  . ganglion cyst  wrist    . KNEE ARTHROSCOPY     right  . NASAL SINUS SURGERY    . OPEN REDUCTION INTERNAL FIXATION (ORIF) DISTAL RADIAL FRACTURE Left 12/18/2016   Procedure: OPEN REDUCTION INTERNAL FIXATION (ORIF) DISTAL RADIAL FRACTURE;  Surgeon: Iran Planas, MD;  Location: [place];  Service: Orthopedics;  Laterality: Left;  . SHOULDER SURGERY  2004, 07/30/10, 01/2011   after her right humeral neck fracture, revision hemiarthroplasty 2004 for persistent pain, revision reverse arthroplasty December 2011 for persistent pain, incision and drainage with poly-exchange 01/26/2011 for possible prosthetic joint infection.  . TONSILLECTOMY AND ADENOIDECTOMY    . TOTAL ABDOMINAL HYSTERECTOMY    . TOTAL HIP ARTHROPLASTY  2004   right  . TOTAL KNEE ARTHROPLASTY  2006   right  . TOTAL SHOULDER REPLACEMENT  2002   right   Social History  Substance Use Topics  . Smoking status: Never Smoker  . Smokeless tobacco: Never Used  . Alcohol use No   family history includes Bladder Cancer in her father; Breast cancer in her unknown relative; Colon polyps in her unknown relative; Depression in her son; Diabetes in her mother; Hypertension in her mother.  ROS as above:  Medications:  Current Outpatient Prescriptions  Medication Sig Dispense Refill  . ALPRAZolam (XANAX) 0.5 MG tablet Take 1 tablet (0.5 mg total) by mouth 3 (three) times daily as needed for anxiety. 90 tablet 3  . AMBULATORY NON FORMULARY MEDICATION [place].  Dx: Rheumatoid Arthritis.  Use daily as needed for ambulation. 1 Units 0  . amLODipine (NORVASC) 10 MG tablet take 1 tablet by mouth once daily 90 tablet 2  . Calcium Carbonate-Vitamin D (CALCIUM 600+D) 600-400 MG-UNIT per tablet Take 1 tablet by mouth 2 (two) times daily.    . diclofenac  sodium (VOLTAREN) 1 % GEL Apply 4 g topically 4 (four) times daily. To affected joint. 100 g 11  . diphenhydrAMINE (BENADRYL) 25 MG tablet Take 25 mg by mouth every 6 (six) hours as needed. For allergies    . fentaNYL (DURAGESIC - DOSED MCG/HR) 12 MCG/HR Place 1 patch (12.5 mcg total) onto the skin every 3 (three) days. 10 patch 0  . hydrochlorothiazide (HYDRODIURIL) 25 MG tablet TAKE 1 TABLET BY MOUTH ONCE DAILY IN THE MORNING FOR BLOOD PRESSURE 90 tablet 0  . metroNIDAZOLE (METROGEL) 0.75 % gel Apply 1 application topically 2 (two) times daily. 45 g 3  . mirtazapine (REMERON) 30 MG tablet Take 1 tablet (30 mg total) by mouth at bedtime. 30 tablet 1  . montelukast (SINGULAIR) 10 MG tablet Take 1 tablet (10 mg total) by mouth at bedtime. 90 tablet 3  . mupirocin ointment (BACTROBAN) 2 % Apply to affected area TID for 7 days. 30 g 3  . omeprazole (PRILOSEC) 20 MG capsule Take 20 mg by mouth daily.  0  . ondansetron (ZOFRAN) 4 MG tablet Take 1 tablet (4 mg total) by mouth every 8 (eight) hours as needed for nausea or vomiting. 20 tablet 12  . Oxycodone HCl 10 MG TABS Take 1 tablet (10 mg total) by mouth 3 (three) times daily as needed (for pain). 90 tablet 0  . polyethylene glycol (MIRALAX / GLYCOLAX) packet Take 17 g by mouth daily. 14 each 0  . potassium chloride SA (K-DUR,KLOR-CON) 20 MEQ tablet Take 1 tablet (20 mEq total) by mouth daily. 90 tablet 1  . simvastatin (ZOCOR) 10 MG tablet TAKE 1 TABLET BY MOUTH ONCE DAILY 90 tablet 0  . zoledronic acid (RECLAST) 5 MG/100ML SOLN Inject 5 mg into the vein. Once yearly. In June     No current facility-administered medications for this visit.    Allergies  Allergen Reactions  . Bee Venom Anaphylaxis    Has epi-pen  . Ceftin [Cefuroxime Axetil] Anaphylaxis  . Ciprofloxacin Anaphylaxis and Palpitations  . Ephedrine Other (See Comments) and Palpitations    "knocks me out"  . Sulfonamide Derivatives Anaphylaxis  . Telithromycin Anaphylaxis     Reaction: also blurred vision   . Valsartan Anaphylaxis  . Verapamil Anaphylaxis  . Venlafaxine Other (See Comments)    Insomnia, panic  . Beta Adrenergic Blockers     bradycardia  . Cephalosporins     Allergic Reaction  . Clindamycin/Lincomycin     Dry skin and itching/ Cdiff  . Cymbalta [Duloxetine Hcl] Other (See Comments)    Reaction:Confusion and " I fall down"  . Doxazosin     "blurred vision and irritable"  . Gabapentin Other (See Comments)    Reaction: headache  . Hydralazine     HA, Diarrhea  . Lamotrigine     Patient unsure at this time  . Morphine Other (See Comments)    Medication has no  effect with pain  . Oxycodone-Acetaminophen Hives    Takes plain oxy IR at home (May 2018)  . Pregabalin Swelling  . Prochlorperazine Edisylate   . Pseudoephedrine Other (See Comments)    Reaction: hyperventilates and blacks out  . Relafen [Nabumetone] Swelling  . Zoloft [Sertraline] Other (See Comments)    "nervous/jittery"  . Doxycycline Hives    Health Maintenance Health Maintenance  Topic Date Due  . TETANUS/TDAP  02/20/2020  . INFLUENZA VACCINE  Completed  . DEXA SCAN  Completed  . PNA vac Low Risk Adult  Completed     Exam:  BP 125/71   Pulse 98   Wt 149 lb (67.6 kg)   BMI 29.10 kg/m  Gen: Well NAD, sitting comfortably in chair HEENT: EOMI,  MMM, well-healing circular area on anti-helix with crusting, no surrounding erythema, no discharge, no tenderness to palpation Lungs: Normal work of breathing. CTABL Heart: RRR, normal S1 and S2, no MRG Abd: NABS, Soft. Nondistended, Nontender Exts: Brisk capillary refill, warm and well perfused.    No results found for this or any previous visit (from the past 72 hour(s)). No results found.    Assessment and Plan: 83 y.o. female presenting for follow-up of anxiety, left ear lesion, and wrist pain. Her anxiety is better controlled than previously and patient is no longer in crisis. Anxiety may increase with  further attempts at medication titration or other management changes. It is reasonable to continue current therapy and monitor symptoms closely.  Left ear lesion: This appears to be well-healing. She no longer needs to apply mupirocin ointment and can apply Vaseline until area is fully healed.  Wrist pain: This has also reached a manageable level at this time and further therapies may worsen symptoms. Patient will continue to do hand exercises regularly, use soft brace as needed, and monitor symptoms closely.  Chronic pain: Managed today. Plan to decrease oxycodone to about 70 pills per month. Pill count today was 92 pills of oxycodone. Fentanyl is due for refill. We'll prescribe oxycodone one month.  No orders of the defined types were placed in this encounter.  Meds ordered this encounter  Medications  . fentaNYL (DURAGESIC - DOSED MCG/HR) 12 MCG/HR    Sig: Place 1 patch (12.5 mcg total) onto the skin every 3 (three) days.    Dispense:  10 patch    Refill:  0     Discussed warning signs or symptoms. Please see discharge instructions. Patient expresses understanding.

## 2017-05-15 NOTE — Patient Instructions (Signed)
Thank you for coming in today. Recheck in 1 month with Lynnae Sandhoff.  Return sooner if needed.

## 2017-05-18 ENCOUNTER — Other Ambulatory Visit: Payer: Self-pay

## 2017-05-18 NOTE — Patient Outreach (Signed)
[facility]) Care Management  05/18/2017  WEDNESDAY ERICSSON [birth year] [id number]  Case closure:  Telephone call to patient for assessment follow up. HIPAA verified. Patient states she is doing about the same.  Patient reports she has seen her new psychiatrist, Dr. De Nurse x 2.  Patient states further visits will be every 3-4 months. Patient states Dr. Georgina Snell will continue to manage her medications.  Patient states her left wrist and hand are about the same. Patient states her pain level today is a 2. Patient states her orthopedic doctor released her at her last appointment and she is to report any concerns to her primary MD.  Patient states she is still taking her pain medications as directed. Patient states, "at this point nothing will make the pain any better."  Patient reports she saw Dr. Georgina Snell on 05/08/18.  Patient states no changes were made to her treatment or medications. Patient states her next follow up with Dr. Georgina Snell is November 2018.  Patient states she has lost approximately 4 lbs over the last 3 months. Patient states her appetite is not the best. Patient states her appetite has been this way and her doctor is aware.  Patient reports she received her flu shot in September 2018.  Patient reports she does not get mammograms anymore.  Patient states she stopped meals on wheels because the food wasn't that good. Patient states the [place] social worker talked with her about transportation but she does not do well with public transportation. Patient states her daughter is able to get her to her appointments.  Patient denies having any further needs at this time.  Patient is in agreement with RNCM to close her to [facility] care management services at this time.  Patient states she has the contact phone number for [facility] care management and 24 hour nurse advise line.  RNCM advised patient to call [facility] care management if she feels services are needed in the future.  RNCM advised patient to  notify MD of any changes in condition prior to scheduled appointment. RNCM verified patient aware of 911 services for urgent/ emergent needs.  ASSESSMENT: Patient seems to be stable at this point. Patient is self managing her care and is aware to contact her doctor for concerns. Patient has been recently release from orthopedic services and knows to relay any concerns to her doctor. Meal and transportation assistance has been offered to patient. Patient refusing services at this time.   PLAN;;  RNCM will refer patient to care management assistant to close due to goals being met. RNCM will notify patients primary MD of closure.  RNCM will send closure letter to patient.  Quinn Plowman RN,BSN,CCM [facility] Telephonic  [phone]

## 2017-05-29 ENCOUNTER — Ambulatory Visit (INDEPENDENT_AMBULATORY_CARE_PROVIDER_SITE_OTHER): Payer: Medicare Other | Admitting: Family Medicine

## 2017-05-29 VITALS — BP 155/81 | HR 120

## 2017-05-29 DIAGNOSIS — F419 Anxiety disorder, unspecified: Secondary | ICD-10-CM

## 2017-05-29 DIAGNOSIS — F329 Major depressive disorder, single episode, unspecified: Secondary | ICD-10-CM | POA: Diagnosis not present

## 2017-05-29 DIAGNOSIS — M25532 Pain in left wrist: Secondary | ICD-10-CM

## 2017-05-29 NOTE — Progress Notes (Signed)
[name] is a 83 y.o. female who presents to [place]: [place] today for anxiety and wrist pain.  Patient has a history of moderately to poorly controlled anxiety and depression as well as left wrist pain following a fracture.   She had a terrible weekend with some interpersonal conflict with one of her daughters and notes that her anxiety is much worse than usual.  She continues to take her mirtazapine and Xanax.  She notes difficulty sleeping and worrying.    Additionally she notes that her left wrist continues to be quite painful at times.  She is very reluctant to use her oxycodone more than once or twice per day due to fear of accidental overdose.  She notes daily persistent pain interferes with her quality of life.  She denies any radiating pain weakness or numbness.     Past Medical History:  Diagnosis Date  . Anxiety   . Arthritis    osteoarthritis  . Asthma   . C. difficile diarrhea   . Candida esophagitis ([place])   . Chronic sinusitis   . Depression   . Depression   . Diverticulosis   . DJD (degenerative joint disease)     L5 compression fracture  . Edema extremities   . Fibromyalgia   . GERD (gastroesophageal reflux disease)   . Gout   . Hyperlipidemia   . Hypertension   . Osteoarthritis   . Osteopenia   . Panic disorder   . Renal failure, unspecified   . Spinal stenosis of lumbar region   . Tubular adenoma of colon   . Wrist fracture, left    Past Surgical History:  Procedure Laterality Date  . ADENOIDECTOMY    . CARPAL TUNNEL RELEASE  2007, 2009   Bilateral  . CATARACT EXTRACTION, BILATERAL    . CHOANAL ADENIODECTOMY    . DIAGNOSTIC LARYNGOSCOPY N/A 11/05/2015   Procedure: DIAGNOSTIC LARYNGOSCOPY AND ESOPHAGOSCOPY;  Surgeon: Rozetta Nunnery, MD;  Location: [facility];  Service: ENT;  Laterality: N/A;  . ganglion cyct removal on fingers      right  . ganglion cyst  wrist    . KNEE ARTHROSCOPY     right  . NASAL SINUS SURGERY    . OPEN REDUCTION INTERNAL FIXATION (ORIF) DISTAL RADIAL FRACTURE Left 12/18/2016   Procedure: OPEN REDUCTION INTERNAL FIXATION (ORIF) DISTAL RADIAL FRACTURE;  Surgeon: Iran Planas, MD;  Location: [facility];  Service: Orthopedics;  Laterality: Left;  . SHOULDER SURGERY  2004, 07/30/10, 01/2011   after her right humeral neck fracture, revision hemiarthroplasty 2004 for persistent pain, revision reverse arthroplasty December 2011 for persistent pain, incision and drainage with poly-exchange 01/26/2011 for possible prosthetic joint infection.  . TONSILLECTOMY AND ADENOIDECTOMY    . TOTAL ABDOMINAL HYSTERECTOMY    . TOTAL HIP ARTHROPLASTY  2004   right  . TOTAL KNEE ARTHROPLASTY  2006   right  . TOTAL SHOULDER REPLACEMENT  2002   right   Social History  Substance Use Topics  . Smoking status: Never Smoker  . Smokeless tobacco: Never Used  . Alcohol use No   family history includes Bladder Cancer in her father; Breast cancer in her unknown relative; Colon polyps in her unknown relative; Depression in her son; Diabetes in her mother; Hypertension in her mother.  ROS as above:  Medications: Current Outpatient Prescriptions  Medication Sig Dispense Refill  . ALPRAZolam (XANAX) 0.5 MG tablet Take 1 tablet (0.5  mg total) by mouth 3 (three) times daily as needed for anxiety. 90 tablet 3  . AMBULATORY NON FORMULARY MEDICATION [place].  Dx: Rheumatoid Arthritis.  Use daily as needed for ambulation. 1 Units 0  . amLODipine (NORVASC) 10 MG tablet take 1 tablet by mouth once daily 90 tablet 2  . Calcium Carbonate-Vitamin D (CALCIUM 600+D) 600-400 MG-UNIT per tablet Take 1 tablet by mouth 2 (two) times daily.    . diclofenac sodium (VOLTAREN) 1 % GEL Apply 4 g topically 4 (four) times daily. To affected joint. 100 g 11  . diphenhydrAMINE (BENADRYL) 25 MG tablet Take 25 mg by mouth every 6 (six) hours as  needed. For allergies    . fentaNYL (DURAGESIC - DOSED MCG/HR) 12 MCG/HR Place 1 patch (12.5 mcg total) onto the skin every 3 (three) days. 10 patch 0  . hydrochlorothiazide (HYDRODIURIL) 25 MG tablet TAKE 1 TABLET BY MOUTH ONCE DAILY IN THE MORNING FOR BLOOD PRESSURE 90 tablet 0  . metroNIDAZOLE (METROGEL) 0.75 % gel Apply 1 application topically 2 (two) times daily. 45 g 3  . mirtazapine (REMERON) 30 MG tablet Take 1 tablet (30 mg total) by mouth at bedtime. 30 tablet 1  . montelukast (SINGULAIR) 10 MG tablet Take 1 tablet (10 mg total) by mouth at bedtime. 90 tablet 3  . mupirocin ointment (BACTROBAN) 2 % Apply to affected area TID for 7 days. 30 g 3  . omeprazole (PRILOSEC) 20 MG capsule Take 20 mg by mouth daily.  0  . ondansetron (ZOFRAN) 4 MG tablet Take 1 tablet (4 mg total) by mouth every 8 (eight) hours as needed for nausea or vomiting. 20 tablet 12  . Oxycodone HCl 10 MG TABS Take 1 tablet (10 mg total) by mouth 3 (three) times daily as needed (for pain). 90 tablet 0  . polyethylene glycol (MIRALAX / GLYCOLAX) packet Take 17 g by mouth daily. 14 each 0  . potassium chloride SA (K-DUR,KLOR-CON) 20 MEQ tablet Take 1 tablet (20 mEq total) by mouth daily. 90 tablet 1  . simvastatin (ZOCOR) 10 MG tablet TAKE 1 TABLET BY MOUTH ONCE DAILY 90 tablet 0  . zoledronic acid (RECLAST) 5 MG/100ML SOLN Inject 5 mg into the vein. Once yearly. In June     No current facility-administered medications for this visit.    Allergies  Allergen Reactions  . Bee Venom Anaphylaxis    Has epi-pen  . Ceftin [Cefuroxime Axetil] Anaphylaxis  . Ciprofloxacin Anaphylaxis and Palpitations  . Ephedrine Other (See Comments) and Palpitations    "knocks me out"  . Sulfonamide Derivatives Anaphylaxis  . Telithromycin Anaphylaxis    Reaction: also blurred vision   . Valsartan Anaphylaxis  . Verapamil Anaphylaxis  . Venlafaxine Other (See Comments)    Insomnia, panic  . Beta Adrenergic Blockers     bradycardia    . Cephalosporins     Allergic Reaction  . Clindamycin/Lincomycin     Dry skin and itching/ Cdiff  . Cymbalta [Duloxetine Hcl] Other (See Comments)    Reaction:Confusion and " I fall down"  . Doxazosin     "blurred vision and irritable"  . Gabapentin Other (See Comments)    Reaction: headache  . Hydralazine     HA, Diarrhea  . Lamotrigine     Patient unsure at this time  . Morphine Other (See Comments)    Medication has no effect with pain  . Oxycodone-Acetaminophen Hives    Takes plain oxy IR at home (May 2018)  .  Pregabalin Swelling  . Prochlorperazine Edisylate   . Pseudoephedrine Other (See Comments)    Reaction: hyperventilates and blacks out  . Relafen [Nabumetone] Swelling  . Zoloft [Sertraline] Other (See Comments)    "nervous/jittery"  . Doxycycline Hives    Health Maintenance Health Maintenance  Topic Date Due  . TETANUS/TDAP  02/20/2020  . INFLUENZA VACCINE  Completed  . DEXA SCAN  Completed  . PNA vac Low Risk Adult  Completed     Exam:  BP (!) 155/81   Pulse (!) 120  Gen: Well NAD HEENT: EOMI,  MMM Lungs: Normal work of breathing. CTABL Heart: RRR no MRG heart rate per my check after conversation was less than 100 bpm Abd: NABS, Soft. Nondistended, Nontender Exts: Brisk capillary refill, warm and well perfused.  Psych alert and oriented tearful affect normal speech thought process.  No SI or HI expressed.   No results found for this or any previous visit (from the past 72 hour(s)). No results found.    Assessment and Plan: 83 y.o. female with  Worsening anxiety. I don't think today's upset reflux fundamental worsening anxiety or depression symptoms.  I do not think there is much point in trying to change her medications.  She has multiple drug allergies and we have had a hard time finding a regimen that works even a little bit for her.  Plan to continue current regimen work on self-guided relaxation techniques and recheck in 2  weeks.  Additionally as for the wrist pain there is not much more to do other than what were already doing.  Continue intermittent bracing and oxycodone as needed.  Use diclofenac gel as needed and recheck in 2 weeks.   No orders of the defined types were placed in this encounter.  No orders of the defined types were placed in this encounter.    Discussed warning signs or symptoms. Please see discharge instructions. Patient expresses understanding.  I spent 25 minutes with this patient, greater than 50% was face-to-face time counseling regarding treatment plan and reassuring Zellie.

## 2017-05-29 NOTE — Patient Instructions (Addendum)
Thank you for coming in today. Continue your current medications.  You can increase xanax to 4x daily as needed sparingly.  Recheck in 2 weeks.

## 2017-06-03 ENCOUNTER — Other Ambulatory Visit: Payer: Self-pay | Admitting: Family Medicine

## 2017-06-07 ENCOUNTER — Other Ambulatory Visit: Payer: Self-pay | Admitting: Family Medicine

## 2017-06-13 ENCOUNTER — Ambulatory Visit (INDEPENDENT_AMBULATORY_CARE_PROVIDER_SITE_OTHER): Payer: Medicare Other | Admitting: Family Medicine

## 2017-06-13 ENCOUNTER — Encounter: Payer: Self-pay | Admitting: Family Medicine

## 2017-06-13 VITALS — BP 150/78 | Temp 98.1°F | Ht 60.0 in | Wt 147.5 lb

## 2017-06-13 DIAGNOSIS — F329 Major depressive disorder, single episode, unspecified: Secondary | ICD-10-CM | POA: Diagnosis not present

## 2017-06-13 DIAGNOSIS — F419 Anxiety disorder, unspecified: Secondary | ICD-10-CM | POA: Diagnosis not present

## 2017-06-13 DIAGNOSIS — F32A Depression, unspecified: Secondary | ICD-10-CM

## 2017-06-13 DIAGNOSIS — G894 Chronic pain syndrome: Secondary | ICD-10-CM

## 2017-06-13 NOTE — Progress Notes (Signed)
[name] is a 83 y.o. female who presents to [place]: [place] today for anxiety and chronic pain.   [name] has marginally controlled anxiety.  She has a regimen listed below including frequent Xanax. She also has the time she does okay with chronic baseline anxiety. Occasionally she however has episodes of much worsening anxiety. She was seen in October 29 for an episode of severe anxiety and notes that just before I went into the exam room she developed severe worsening anxiety. She has most the time she does okay but finds these episodes of worsening anxiety to be very distressing. She thinks are probably panic attacks.  Chronic pain: Brother notes continued chronic pain for which she uses chronic opiates. This works marginally to help control her pain. The pain control however does allow her to be more functional.   Past Medical History:  Diagnosis Date  . Anxiety   . Arthritis    osteoarthritis  . Asthma   . C. difficile diarrhea   . Candida esophagitis ([place])   . Chronic sinusitis   . Depression   . Depression   . Diverticulosis   . DJD (degenerative joint disease)     L5 compression fracture  . Edema extremities   . Fibromyalgia   . GERD (gastroesophageal reflux disease)   . Gout   . Hyperlipidemia   . Hypertension   . Osteoarthritis   . Osteopenia   . Panic disorder   . Renal failure, unspecified   . Spinal stenosis of lumbar region   . Tubular adenoma of colon   . Wrist fracture, left    Past Surgical History:  Procedure Laterality Date  . ADENOIDECTOMY    . CARPAL TUNNEL RELEASE  2007, 2009   Bilateral  . CATARACT EXTRACTION, BILATERAL    . CHOANAL ADENIODECTOMY    . ganglion cyct removal on fingers     right  . ganglion cyst  wrist    . KNEE ARTHROSCOPY     right  . NASAL SINUS SURGERY    . SHOULDER SURGERY  2004, 07/30/10, 01/2011   after her right humeral neck fracture, revision hemiarthroplasty 2004 for persistent pain, revision reverse arthroplasty December 2011 for persistent pain, incision and drainage with poly-exchange 01/26/2011 for possible prosthetic joint infection.  . TONSILLECTOMY AND ADENOIDECTOMY    . TOTAL ABDOMINAL HYSTERECTOMY    . TOTAL HIP ARTHROPLASTY  2004   right  . TOTAL KNEE ARTHROPLASTY  2006   right  . TOTAL SHOULDER REPLACEMENT  2002   right   Social History   Tobacco Use  . Smoking status: Never Smoker  . Smokeless tobacco: Never Used  Substance Use Topics  . Alcohol use: No   family history includes Bladder Cancer in her father; Breast cancer in her unknown relative; Colon polyps in her unknown relative; Depression in her son; Diabetes in her mother; Hypertension in her mother.  ROS as above:  Medications: Current Outpatient Medications  Medication Sig Dispense Refill  . ALPRAZolam (XANAX) 0.5 MG tablet Take 1 tablet (0.5 mg total) by mouth 3 (three) times daily as needed for anxiety. 90 tablet 3  . AMBULATORY NON FORMULARY MEDICATION [place].  Dx: Rheumatoid Arthritis.  Use daily as needed for ambulation. 1 Units 0  . amLODipine (NORVASC) 10 MG tablet TAKE 1 TABLET BY MOUTH ONCE DAILY 90 tablet 0  . Calcium Carbonate-Vitamin D (CALCIUM 600+D) 600-400 MG-UNIT per tablet Take  1 tablet by mouth 2 (two) times daily.    . diclofenac sodium (VOLTAREN) 1 % GEL Apply 4 g topically 4 (four) times daily. To affected joint. 100 g 11  . diphenhydrAMINE (BENADRYL) 25 MG tablet Take 25 mg by mouth every 6 (six) hours as needed. For allergies    . fentaNYL (DURAGESIC - DOSED MCG/HR) 12 MCG/HR Place 1 patch (12.5 mcg total) onto the skin every 3 (three) days. 10 patch 0  . hydrochlorothiazide (HYDRODIURIL) 25 MG tablet TAKE 1 TABLET BY MOUTH ONCE DAILY IN THE MORNING FOR BLOOD PRESSURE 90 tablet 0  . metroNIDAZOLE (METROGEL) 0.75 % gel Apply 1 application topically 2 (two) times daily. 45 g  3  . mirtazapine (REMERON) 30 MG tablet TAKE 1 TABLET BY MOUTH AT BEDTIME 30 tablet 1  . montelukast (SINGULAIR) 10 MG tablet Take 1 tablet (10 mg total) by mouth at bedtime. 90 tablet 3  . mupirocin ointment (BACTROBAN) 2 % Apply to affected area TID for 7 days. 30 g 3  . omeprazole (PRILOSEC) 20 MG capsule Take 20 mg by mouth daily.  0  . ondansetron (ZOFRAN) 4 MG tablet Take 1 tablet (4 mg total) by mouth every 8 (eight) hours as needed for nausea or vomiting. 20 tablet 12  . Oxycodone HCl 10 MG TABS Take 1 tablet (10 mg total) by mouth 3 (three) times daily as needed (for pain). 90 tablet 0  . polyethylene glycol (MIRALAX / GLYCOLAX) packet Take 17 g by mouth daily. 14 each 0  . potassium chloride SA (K-DUR,KLOR-CON) 20 MEQ tablet Take 1 tablet (20 mEq total) by mouth daily. 90 tablet 1  . simvastatin (ZOCOR) 10 MG tablet TAKE 1 TABLET BY MOUTH ONCE DAILY 90 tablet 0  . zoledronic acid (RECLAST) 5 MG/100ML SOLN Inject 5 mg into the vein. Once yearly. In June     No current facility-administered medications for this visit.    Allergies  Allergen Reactions  . Bee Venom Anaphylaxis    Has epi-pen  . Ceftin [Cefuroxime Axetil] Anaphylaxis  . Ciprofloxacin Anaphylaxis and Palpitations  . Ephedrine Other (See Comments) and Palpitations    "knocks me out"  . Sulfonamide Derivatives Anaphylaxis  . Telithromycin Anaphylaxis    Reaction: also blurred vision   . Valsartan Anaphylaxis  . Verapamil Anaphylaxis  . Venlafaxine Other (See Comments)    Insomnia, panic  . Beta Adrenergic Blockers     bradycardia  . Cephalosporins     Allergic Reaction  . Clindamycin/Lincomycin     Dry skin and itching/ Cdiff  . Cymbalta [Duloxetine Hcl] Other (See Comments)    Reaction:Confusion and " I fall down"  . Doxazosin     "blurred vision and irritable"  . Gabapentin Other (See Comments)    Reaction: headache  . Hydralazine     HA, Diarrhea  . Lamotrigine     Patient unsure at this time  .  Morphine Other (See Comments)    Medication has no effect with pain  . Oxycodone-Acetaminophen Hives    Takes plain oxy IR at home (May 2018)  . Pregabalin Swelling  . Prochlorperazine Edisylate   . Pseudoephedrine Other (See Comments)    Reaction: hyperventilates and blacks out  . Relafen [Nabumetone] Swelling  . Zoloft [Sertraline] Other (See Comments)    "nervous/jittery"  . Doxycycline Hives    Health Maintenance Health Maintenance  Topic Date Due  . TETANUS/TDAP  02/20/2020  . INFLUENZA VACCINE  Completed  . DEXA SCAN  Completed  . PNA vac Low Risk Adult  Completed     Exam:  BP (!) 150/78   Temp 98.1 F (36.7 C) (Oral)   Ht 5' (1.524 m)   Wt 147 lb 8 oz (66.9 kg)   BMI 28.81 kg/m  Gen: Well NAD HEENT: EOMI,  MMM Lungs: Normal work of breathing. CTABL Heart: RRR no MRG Abd: NABS, Soft. Nondistended, Nontender Exts: Brisk capillary refill, warm and well perfused.  Psych alert and oriented normal speech. Affect is tearful. No SI or HI expressed.   No results found for this or any previous visit (from the past 72 hour(s)). No results found.    Assessment and Plan: 83 y.o. female with  Anxiety: Patient has baseline anxiety which is medium to poorly controlled. She's had multiple different regimens tried and has multiple different intolerances or allergies. I don't think that more medications are different medications are likely to make much of a difference. I do think however that therapy or counseling especially focusing on cognitive behavioral therapy for management strategies for panic attacks are probably going to be helpful. Referral to behavioral health ordered and I will follow-up in one month.  Chronic pain: At baseline seems to be doing okay but not great. Again I don't think more medicines likely given make much of a difference here. She seems to be stable. Continue current regimen and recheck in 1 month.   Orders Placed This Encounter  Procedures  .  Ambulatory referral to Behavioral Health    Referral Priority:   Routine    Referral Type:   Psychiatric    Referral Reason:   Specialty Services Required    Requested Specialty:   Behavioral Health    Number of Visits Requested:   1   No orders of the defined types were placed in this encounter.    Discussed warning signs or symptoms. Please see discharge instructions. Patient expresses understanding.  I spent 40 minutes with this patient, greater than 50% was face-to-face time counseling regarding ddx and treatment plan and discussing strategies for panic attack.

## 2017-06-13 NOTE — Patient Instructions (Addendum)
Thank you for coming in today. You should hear about therapy at the behavioral health clinic in this building.  I recommend that you attend.  Recheck with me in 1 month.  Return sooner if needed.    Panic Attacks Panic attacks are sudden, short feelings of great fear or discomfort. You may have them for no reason when you are relaxed, when you are uneasy (anxious), or when you are sleeping. Follow these instructions at home:  Take all your medicines as told.  Check with your doctor before starting new medicines.  Keep all doctor visits. Contact a doctor if:  You are not able to take your medicines as told.  Your symptoms do not get better.  Your symptoms get worse. Get help right away if:  Your attacks seem different than your normal attacks.  You have thoughts about hurting yourself or others.  You take panic attack medicine and you have a side effect. This information is not intended to replace advice given to you by your health care provider. Make sure you discuss any questions you have with your health care provider. Document Released: 08/20/2010 Document Revised: 12/24/2015 Document Reviewed: 03/01/2013 Elsevier Interactive Patient Education  2017 [employer].

## 2017-06-14 ENCOUNTER — Other Ambulatory Visit: Payer: Self-pay

## 2017-06-14 MED ORDER — FENTANYL 12 MCG/HR TD PT72: 12.5000 ug | MEDICATED_PATCH | TRANSDERMAL | 0 refills | Status: DC

## 2017-06-16 ENCOUNTER — Telehealth: Payer: Self-pay | Admitting: Family Medicine

## 2017-06-16 NOTE — Telephone Encounter (Signed)
It is ok to wait that long.  Please inform pt

## 2017-06-16 NOTE — Telephone Encounter (Signed)
Pt informed

## 2017-06-16 NOTE — Telephone Encounter (Signed)
Pt called. She is unable to get an appointment with [facility] until December 17th and wants  to know if you think it is okay for her to wait until then.

## 2017-07-12 ENCOUNTER — Ambulatory Visit: Payer: Self-pay | Admitting: Family Medicine

## 2017-07-17 ENCOUNTER — Ambulatory Visit (INDEPENDENT_AMBULATORY_CARE_PROVIDER_SITE_OTHER): Payer: Medicare Other | Admitting: Licensed Clinical Social Worker

## 2017-07-17 DIAGNOSIS — F321 Major depressive disorder, single episode, moderate: Secondary | ICD-10-CM | POA: Diagnosis not present

## 2017-07-17 DIAGNOSIS — F4323 Adjustment disorder with mixed anxiety and depressed mood: Secondary | ICD-10-CM
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
## 2017-07-18 ENCOUNTER — Ambulatory Visit (INDEPENDENT_AMBULATORY_CARE_PROVIDER_SITE_OTHER): Payer: Medicare Other | Admitting: Family Medicine

## 2017-07-18 ENCOUNTER — Encounter (HOSPITAL_COMMUNITY): Payer: Self-pay | Admitting: Psychiatry

## 2017-07-18 ENCOUNTER — Ambulatory Visit (INDEPENDENT_AMBULATORY_CARE_PROVIDER_SITE_OTHER): Payer: Medicare Other | Admitting: Psychiatry

## 2017-07-18 ENCOUNTER — Other Ambulatory Visit: Payer: Self-pay

## 2017-07-18 VITALS — BP 162/70 | HR 114 | Ht 60.0 in | Wt 154.0 lb

## 2017-07-18 VITALS — BP 170/96 | HR 117 | Wt 153.0 lb

## 2017-07-18 DIAGNOSIS — F324 Major depressive disorder, single episode, in partial remission: Secondary | ICD-10-CM

## 2017-07-18 DIAGNOSIS — F411 Generalized anxiety disorder: Secondary | ICD-10-CM | POA: Diagnosis not present

## 2017-07-18 DIAGNOSIS — F329 Major depressive disorder, single episode, unspecified: Secondary | ICD-10-CM

## 2017-07-18 DIAGNOSIS — F419 Anxiety disorder, unspecified: Secondary | ICD-10-CM

## 2017-07-18 DIAGNOSIS — F4323 Adjustment disorder with mixed anxiety and depressed mood: Secondary | ICD-10-CM | POA: Diagnosis not present

## 2017-07-18 DIAGNOSIS — Z818 Family history of other mental and behavioral disorders: Secondary | ICD-10-CM

## 2017-07-18 DIAGNOSIS — G894 Chronic pain syndrome: Secondary | ICD-10-CM | POA: Diagnosis not present

## 2017-07-18 DIAGNOSIS — M25532 Pain in left wrist: Secondary | ICD-10-CM

## 2017-07-18 MED ORDER — VORTIOXETINE HBR 5 MG PO TABS: 5.0000 mg | ORAL_TABLET | Freq: Every day | ORAL | 1 refills | Status: DC

## 2017-07-18 MED ORDER — MIRTAZAPINE 30 MG PO TABS: 30.0000 mg | ORAL_TABLET | Freq: Every day | ORAL | 1 refills | Status: DC

## 2017-07-18 NOTE — Patient Instructions (Signed)
Started on small dose of trintellix for depression. Pick up from pharmacy

## 2017-07-18 NOTE — Progress Notes (Signed)
[facility] Outpatient Follow up visit   Patient Identification: [name] MRN:  [id number] Date of Evaluation:  07/18/2017 Referral Source: primary care Chief Complaint:   Chief Complaint    Follow-up; Other     Visit Diagnosis:    ICD-10-CM   1. Major depressive disorder with single episode, in partial remission ([place]) F32.4   2. Adjustment disorder with mixed anxiety and depressed mood F43.23   3. GAD (generalized anxiety disorder) F41.1     History of Present Illness:  83 years old currently married Caucasian female initially  referred by primary care physician for management of depression  Endorsing depression. Allergies to many meds used in the past.  takex xanax still endorses anxiety. Lonely despite being married. He doesn't hold me  She does not like the place where she is living. Poor support system and not have friends    Aggravating factors; relationship. Husband sick. Finances low saving Modifying factors; dogs Severity of depression: somewhat more down. 5/10 Timing near all day    Past Psychiatric History: depression. When her 4 th kid was born. She had to be hospitalized later for depression  Previous Psychotropic Medications: Yes   Substance Abuse History in the last 12 months:  No.  Consequences of Substance Abuse: NA  Past Medical History:  Past Medical History:  Diagnosis Date  . Anxiety   . Arthritis    osteoarthritis  . Asthma   . C. difficile diarrhea   . Candida esophagitis ([place])   . Chronic sinusitis   . Depression   . Depression   . Diverticulosis   . DJD (degenerative joint disease)     L5 compression fracture  . Edema extremities   . Fibromyalgia   . GERD (gastroesophageal reflux disease)   . Gout   . Hyperlipidemia   . Hypertension   . Osteoarthritis   . Osteopenia   . Panic disorder   . Renal failure, unspecified   . Spinal stenosis of lumbar region   . Tubular adenoma of colon   . Wrist fracture, left     Past  Surgical History:  Procedure Laterality Date  . ADENOIDECTOMY    . CARPAL TUNNEL RELEASE  2007, 2009   Bilateral  . CATARACT EXTRACTION, BILATERAL    . CHOANAL ADENIODECTOMY    . DIAGNOSTIC LARYNGOSCOPY N/A 11/05/2015   Procedure: DIAGNOSTIC LARYNGOSCOPY AND ESOPHAGOSCOPY;  Surgeon: Rozetta Nunnery, MD;  Location: [facility];  Service: ENT;  Laterality: N/A;  . ganglion cyct removal on fingers     right  . ganglion cyst  wrist    . KNEE ARTHROSCOPY     right  . NASAL SINUS SURGERY    . OPEN REDUCTION INTERNAL FIXATION (ORIF) DISTAL RADIAL FRACTURE Left 12/18/2016   Procedure: OPEN REDUCTION INTERNAL FIXATION (ORIF) DISTAL RADIAL FRACTURE;  Surgeon: Iran Planas, MD;  Location: [place];  Service: Orthopedics;  Laterality: Left;  . SHOULDER SURGERY  2004, 07/30/10, 01/2011   after her right humeral neck fracture, revision hemiarthroplasty 2004 for persistent pain, revision reverse arthroplasty December 2011 for persistent pain, incision and drainage with poly-exchange 01/26/2011 for possible prosthetic joint infection.  . TONSILLECTOMY AND ADENOIDECTOMY    . TOTAL ABDOMINAL HYSTERECTOMY    . TOTAL HIP ARTHROPLASTY  2004   right  . TOTAL KNEE ARTHROPLASTY  2006   right  . TOTAL SHOULDER REPLACEMENT  2002   right    Family Psychiatric History: denies. But believe her mom may have had depression. Her son  Family History:  Family History  Problem Relation Age of Onset  . Bladder Cancer Father   . Diabetes Mother   . Hypertension Mother   . Breast cancer Unknown   . Colon polyps Unknown   . Depression Son     Social History:   Social History   Socioeconomic History  . Marital status: Married    Spouse name: None  . Number of children: None  . Years of education: None  . Highest education level: None  Social Needs  . Financial resource strain: None  . Food insecurity - worry: None  . Food insecurity - inability: None  . Transportation needs - medical: None  .  Transportation needs - non-medical: None  Occupational History  . None  Tobacco Use  . Smoking status: Never Smoker  . Smokeless tobacco: Never Used  Substance and Sexual Activity  . Alcohol use: No  . Drug use: No  . Sexual activity: No  Other Topics Concern  . None  Social History Narrative  . None     Allergies:   Allergies  Allergen Reactions  . Bee Venom Anaphylaxis    Has epi-pen  . Ceftin [Cefuroxime Axetil] Anaphylaxis  . Ciprofloxacin Anaphylaxis and Palpitations  . Ephedrine Other (See Comments) and Palpitations    "knocks me out"  . Sulfonamide Derivatives Anaphylaxis  . Telithromycin Anaphylaxis    Reaction: also blurred vision   . Valsartan Anaphylaxis  . Verapamil Anaphylaxis  . Venlafaxine Other (See Comments)    Insomnia, panic  . Beta Adrenergic Blockers     bradycardia  . Cephalosporins     Allergic Reaction  . Clindamycin/Lincomycin     Dry skin and itching/ Cdiff  . Cymbalta [Duloxetine Hcl] Other (See Comments)    Reaction:Confusion and " I fall down"  . Doxazosin     "blurred vision and irritable"  . Gabapentin Other (See Comments)    Reaction: headache  . Hydralazine     HA, Diarrhea  . Lamotrigine     Patient unsure at this time  . Morphine Other (See Comments)    Medication has no effect with pain  . Oxycodone-Acetaminophen Hives    Takes plain oxy IR at home (May 2018)  . Pregabalin Swelling  . Prochlorperazine Edisylate   . Pseudoephedrine Other (See Comments)    Reaction: hyperventilates and blacks out  . Relafen [Nabumetone] Swelling  . Zoloft [Sertraline] Other (See Comments)    "nervous/jittery"  . Doxycycline Hives    Metabolic Disorder Labs: Lab Results  Component Value Date   HGBA1C 6.1 (H) 02/10/2016   MPG 128 02/10/2016   MPG 131 (H) 05/26/2015   No results found for: PROLACTIN Lab Results  Component Value Date   CHOL 130 11/24/2015   TRIG 191 (H) 11/24/2015   HDL 29 (L) 11/24/2015   CHOLHDL 4.5 11/24/2015    VLDL 38 (H) 11/24/2015   LDLCALC 63 11/24/2015   LDLCALC 96 09/04/2014     Current Medications: Current Outpatient Medications  Medication Sig Dispense Refill  . ALPRAZolam (XANAX) 0.5 MG tablet Take 1 tablet (0.5 mg total) by mouth 3 (three) times daily as needed for anxiety. 90 tablet 3  . AMBULATORY NON FORMULARY MEDICATION [place].  Dx: Rheumatoid Arthritis.  Use daily as needed for ambulation. 1 Units 0  . amLODipine (NORVASC) 10 MG tablet TAKE 1 TABLET BY MOUTH ONCE DAILY 90 tablet 0  . Calcium Carbonate-Vitamin D (CALCIUM 600+D) 600-400 MG-UNIT per tablet Take 1 tablet  by mouth 2 (two) times daily.    . diclofenac sodium (VOLTAREN) 1 % GEL Apply 4 g topically 4 (four) times daily. To affected joint. 100 g 11  . diphenhydrAMINE (BENADRYL) 25 MG tablet Take 25 mg by mouth every 6 (six) hours as needed. For allergies    . fentaNYL (DURAGESIC - DOSED MCG/HR) 12 MCG/HR Place 1 patch (12.5 mcg total) every 3 (three) days onto the skin. 10 patch 0  . hydrochlorothiazide (HYDRODIURIL) 25 MG tablet TAKE 1 TABLET BY MOUTH ONCE DAILY IN THE MORNING FOR BLOOD PRESSURE 90 tablet 0  . metroNIDAZOLE (METROGEL) 0.75 % gel Apply 1 application topically 2 (two) times daily. 45 g 3  . mirtazapine (REMERON) 30 MG tablet TAKE 1 TABLET BY MOUTH AT BEDTIME 30 tablet 1  . montelukast (SINGULAIR) 10 MG tablet Take 1 tablet (10 mg total) by mouth at bedtime. 90 tablet 3  . mupirocin ointment (BACTROBAN) 2 % Apply to affected area TID for 7 days. 30 g 3  . omeprazole (PRILOSEC) 20 MG capsule Take 20 mg by mouth daily.  0  . ondansetron (ZOFRAN) 4 MG tablet Take 1 tablet (4 mg total) by mouth every 8 (eight) hours as needed for nausea or vomiting. 20 tablet 12  . Oxycodone HCl 10 MG TABS Take 1 tablet (10 mg total) by mouth 3 (three) times daily as needed (for pain). 90 tablet 0  . polyethylene glycol (MIRALAX / GLYCOLAX) packet Take 17 g by mouth daily. 14 each 0  . potassium chloride SA  (K-DUR,KLOR-CON) 20 MEQ tablet Take 1 tablet (20 mEq total) by mouth daily. 90 tablet 1  . simvastatin (ZOCOR) 10 MG tablet TAKE 1 TABLET BY MOUTH ONCE DAILY 90 tablet 0  . zoledronic acid (RECLAST) 5 MG/100ML SOLN Inject 5 mg into the vein. Once yearly. In June    . vortioxetine HBr (TRINTELLIX) 5 MG TABS Take 1 tablet (5 mg total) by mouth daily. 30 tablet 1   No current facility-administered medications for this visit.       Psychiatric Specialty Exam: Review of Systems  Skin: Negative for itching.  Psychiatric/Behavioral: Positive for depression. Negative for suicidal ideas.    Blood pressure (!) 162/70, pulse (!) 114, height 5' (1.524 m), weight 154 lb (69.9 kg).Body mass index is 30.08 kg/m.  General Appearance: Casual  Eye Contact:  Fair  Speech:  Normal Rate  Volume:  Decreased  Mood:  dysphoric  Affect: congruent and tearful  Thought Process:  Goal Directed  Orientation:  Full (Time, Place, and Person)  Thought Content:  Rumination  Suicidal Thoughts:  No  Homicidal Thoughts:  No  Memory:  Immediate;   Fair Recent;   Fair  Judgement:  Fair  Insight:  Fair  Psychomotor Activity:  Decreased  Concentration:  Concentration: Fair and Attention Span: Fair  Recall:  [employer] of Knowledge:Fair  Language: Fair  Akathisia:  Negative  Handed:  Right  AIMS (if indicated):    Assets:  Desire for Improvement  ADL's:  Intact  Cognition: WNL  Sleep:  fair    Treatment Plan Summary: Medication management and Plan as follows  1. Major depression recurrent, worse. Continue remeron and add trintellix 5mg   2.  Generalized anxiety disorder; ongoing. Continue remeron. Also on xanax. continue 3. Mood disorder relavant to phsyical condition and medical condition : has limitations and it effects her mood. Continue to follow with providers  Provided supportive therapy. Feels lonely. Follow up with therapy. Will add  trintellix Fu 1 month or early Merian Capron,  MD 12/18/201811:07 AM

## 2017-07-18 NOTE — Patient Instructions (Addendum)
Thank you for coming in today. I recommend continuing Remeron Start Trintelix.  Return in 1 month.   Use the xanax for panic attack.  Return sooner if needed.

## 2017-07-18 NOTE — Progress Notes (Signed)
[name] is a 83 y.o. female who presents to [facility] today for follow-up anxiety and depression.  [name] presents to clinic today for anxiety and depression.  These have been ongoing years.  She currently is managed with Remeron 30 mg daily and Xanax up to 3 times daily.  She was seen by psychiatry today and therapy yesterday.  She was started on Trent Telex 5 mg a day but has not yet started it.  She is worried about potential side effects.  She notes in the last month her symptoms have been reasonably stable but somewhat worse.  She notes today is a particularly bad day and she feels as though she has having a panic attack.  Additionally she notes persistent left wrist pain.  She suffered a fracture earlier this year and had open reduction and internal fixation.  She has persistent pain that is worse with activity and somewhat better with rest.  She is tried using a brace as well as diclofenac gel which has helped some.  She continues to use her chronic opiates for her back pain which does help her wrist a bit.   Past Medical History:  Diagnosis Date  . Anxiety   . Arthritis    osteoarthritis  . Asthma   . C. difficile diarrhea   . Candida esophagitis ([place])   . Chronic sinusitis   . Depression   . Depression   . Diverticulosis   . DJD (degenerative joint disease)     L5 compression fracture  . Edema extremities   . Fibromyalgia   . GERD (gastroesophageal reflux disease)   . Gout   . Hyperlipidemia   . Hypertension   . Osteoarthritis   . Osteopenia   . Panic disorder   . Renal failure, unspecified   . Spinal stenosis of lumbar region   . Tubular adenoma of colon   . Wrist fracture, left    Past Surgical History:  Procedure Laterality Date  . ADENOIDECTOMY    . CARPAL TUNNEL RELEASE  2007, 2009   Bilateral  . CATARACT EXTRACTION, BILATERAL      . CHOANAL ADENIODECTOMY    . DIAGNOSTIC LARYNGOSCOPY N/A 11/05/2015   Procedure: DIAGNOSTIC LARYNGOSCOPY AND ESOPHAGOSCOPY;  Surgeon: Rozetta Nunnery, MD;  Location: [facility];  Service: ENT;  Laterality: N/A;  . ganglion cyct removal on fingers     right  . ganglion cyst  wrist    . KNEE ARTHROSCOPY     right  . NASAL SINUS SURGERY    . OPEN REDUCTION INTERNAL FIXATION (ORIF) DISTAL RADIAL FRACTURE Left 12/18/2016   Procedure: OPEN REDUCTION INTERNAL FIXATION (ORIF) DISTAL RADIAL FRACTURE;  Surgeon: Iran Planas, MD;  Location: [facility];  Service: Orthopedics;  Laterality: Left;  . SHOULDER SURGERY  2004, 07/30/10, 01/2011   after her right humeral neck fracture, revision hemiarthroplasty 2004 for persistent pain, revision reverse arthroplasty December 2011 for persistent pain, incision and drainage with poly-exchange 01/26/2011 for possible prosthetic joint infection.  . TONSILLECTOMY AND ADENOIDECTOMY    . TOTAL ABDOMINAL HYSTERECTOMY    . TOTAL HIP ARTHROPLASTY  2004   right  . TOTAL KNEE ARTHROPLASTY  2006   right  . TOTAL SHOULDER REPLACEMENT  2002   right   Social History   Tobacco Use  . Smoking status: Never Smoker  . Smokeless tobacco: Never Used  Substance Use Topics  . Alcohol use: No   family  history includes Bladder Cancer in her father; Breast cancer in her unknown relative; Colon polyps in her unknown relative; Depression in her son; Diabetes in her mother; Hypertension in her mother.  ROS as above:  Medications: Current Outpatient Medications  Medication Sig Dispense Refill  . ALPRAZolam (XANAX) 0.5 MG tablet Take 1 tablet (0.5 mg total) by mouth 3 (three) times daily as needed for anxiety. 90 tablet 3  . AMBULATORY NON FORMULARY MEDICATION Rolling [place].  Dx: Rheumatoid Arthritis.  Use daily as needed for ambulation. 1 Units 0  . amLODipine (NORVASC) 10 MG tablet TAKE 1 TABLET BY MOUTH ONCE DAILY 90 tablet 0  . Calcium Carbonate-Vitamin D (CALCIUM 600+D)  600-400 MG-UNIT per tablet Take 1 tablet by mouth 2 (two) times daily.    . diclofenac sodium (VOLTAREN) 1 % GEL Apply 4 g topically 4 (four) times daily. To affected joint. 100 g 11  . diphenhydrAMINE (BENADRYL) 25 MG tablet Take 25 mg by mouth every 6 (six) hours as needed. For allergies    . fentaNYL (DURAGESIC - DOSED MCG/HR) 12 MCG/HR Place 1 patch (12.5 mcg total) every 3 (three) days onto the skin. 10 patch 0  . hydrochlorothiazide (HYDRODIURIL) 25 MG tablet TAKE 1 TABLET BY MOUTH ONCE DAILY IN THE MORNING FOR BLOOD PRESSURE 90 tablet 0  . metroNIDAZOLE (METROGEL) 0.75 % gel Apply 1 application topically 2 (two) times daily. 45 g 3  . mirtazapine (REMERON) 30 MG tablet Take 1 tablet (30 mg total) by mouth at bedtime. 90 tablet 1  . montelukast (SINGULAIR) 10 MG tablet Take 1 tablet (10 mg total) by mouth at bedtime. 90 tablet 3  . mupirocin ointment (BACTROBAN) 2 % Apply to affected area TID for 7 days. 30 g 3  . omeprazole (PRILOSEC) 20 MG capsule Take 20 mg by mouth daily.  0  . ondansetron (ZOFRAN) 4 MG tablet Take 1 tablet (4 mg total) by mouth every 8 (eight) hours as needed for nausea or vomiting. 20 tablet 12  . Oxycodone HCl 10 MG TABS Take 1 tablet (10 mg total) by mouth 3 (three) times daily as needed (for pain). 90 tablet 0  . polyethylene glycol (MIRALAX / GLYCOLAX) packet Take 17 g by mouth daily. 14 each 0  . potassium chloride SA (K-DUR,KLOR-CON) 20 MEQ tablet Take 1 tablet (20 mEq total) by mouth daily. 90 tablet 1  . simvastatin (ZOCOR) 10 MG tablet TAKE 1 TABLET BY MOUTH ONCE DAILY 90 tablet 0  . vortioxetine HBr (TRINTELLIX) 5 MG TABS Take 1 tablet (5 mg total) by mouth daily. 30 tablet 1  . zoledronic acid (RECLAST) 5 MG/100ML SOLN Inject 5 mg into the vein. Once yearly. In June     No current facility-administered medications for this visit.    Allergies  Allergen Reactions  . Bee Venom Anaphylaxis    Has epi-pen  . Ceftin [Cefuroxime Axetil] Anaphylaxis  .  Ciprofloxacin Anaphylaxis and Palpitations  . Ephedrine Other (See Comments) and Palpitations    "knocks me out"  . Sulfonamide Derivatives Anaphylaxis  . Telithromycin Anaphylaxis    Reaction: also blurred vision   . Valsartan Anaphylaxis  . Verapamil Anaphylaxis  . Venlafaxine Other (See Comments)    Insomnia, panic  . Beta Adrenergic Blockers     bradycardia  . Cephalosporins     Allergic Reaction  . Clindamycin/Lincomycin     Dry skin and itching/ Cdiff  . Cymbalta [Duloxetine Hcl] Other (See Comments)    Reaction:Confusion and " I  fall down"  . Doxazosin     "blurred vision and irritable"  . Gabapentin Other (See Comments)    Reaction: headache  . Hydralazine     HA, Diarrhea  . Lamotrigine     Patient unsure at this time  . Morphine Other (See Comments)    Medication has no effect with pain  . Oxycodone-Acetaminophen Hives    Takes plain oxy IR at home (May 2018)  . Pregabalin Swelling  . Prochlorperazine Edisylate   . Pseudoephedrine Other (See Comments)    Reaction: hyperventilates and blacks out  . Relafen [Nabumetone] Swelling  . Zoloft [Sertraline] Other (See Comments)    "nervous/jittery"  . Doxycycline Hives    Health Maintenance Health Maintenance  Topic Date Due  . TETANUS/TDAP  02/20/2020  . INFLUENZA VACCINE  Completed  . DEXA SCAN  Completed  . PNA vac Low Risk Adult  Completed     Exam:  BP (!) 170/96   Pulse (!) 117   Wt 153 lb (69.4 kg)   BMI 29.88 kg/m  Gen: Well NAD HEENT: EOMI,  MMM Lungs: Normal work of breathing. CTABL Heart: RRR no MRG Abd: NABS, Soft. Nondistended, Nontender Exts: Brisk capillary refill, warm and well perfused.  Psych alert and oriented.  Tearful affect.  Normal speech.  Thought processes somewhat tangential.  No SI or HI expressed. MSK: Left wrist: Mild effusion well-appearing scar at the volar wrist.  Motion is intact.  No results found for this or any previous visit (from the past 72 hour(s)). No results  found.    Assessment and Plan: 83 y.o. female with  Anxiety and depression.  Patient is having a panic attack during today's exam so it is hard to tell how well she is doing overall the last month.  I think it is reasonable to continue Remeron.  Also think is reasonable to add Trental X.  Use Xanax sparingly and recheck in 1 month.  Continue counseling and therapy.  Wrist pain: Degenerative following surgery.  There is not really much to do here.  Plan for watchful waiting and continue diclofenac gel.  Recheck in 1 month.   No orders of the defined types were placed in this encounter.  Meds ordered this encounter  Medications  . mirtazapine (REMERON) 30 MG tablet    Sig: Take 1 tablet (30 mg total) by mouth at bedtime.    Dispense:  90 tablet    Refill:  1     Discussed warning signs or symptoms. Please see discharge instructions. Patient expresses understanding.  I spent 40 minutes with this patient, greater than 50% was face-to-face time counseling regarding discussion about anxiety and depression symptoms.[name]

## 2017-07-21 ENCOUNTER — Ambulatory Visit: Payer: Self-pay | Admitting: Family Medicine

## 2017-07-21 ENCOUNTER — Other Ambulatory Visit: Payer: Self-pay

## 2017-07-21 NOTE — Telephone Encounter (Signed)
[name] would like a refill on the Fentanyl patch. She also want to make sure it is ok to use with the rest of her medications.

## 2017-07-26 ENCOUNTER — Telehealth: Payer: Self-pay | Admitting: Family Medicine

## 2017-07-26 NOTE — Telephone Encounter (Signed)
Routed to Dr. Georgina Snell. Marvell Tamer,CMA

## 2017-07-26 NOTE — Telephone Encounter (Signed)
Pt called and would like a refill on her fentanyl patches. Thanks

## 2017-07-26 NOTE — Telephone Encounter (Signed)
Patient called for the 2nd time requested a refill on Fentanyl patches she has 4 patches left but want to make sure its at the pharmacy for her to pickup before she runs out. she also wants to know if she have to come pick it up or if it can be sent to the pharmacy electronically. Please advise. Gleason Ardoin,CMA

## 2017-07-27 ENCOUNTER — Ambulatory Visit (HOSPITAL_COMMUNITY): Payer: Self-pay | Admitting: Psychiatry

## 2017-07-27 MED ORDER — FENTANYL 12 MCG/HR TD PT72: 12.5000 ug | MEDICATED_PATCH | TRANSDERMAL | 0 refills | Status: DC

## 2017-07-27 NOTE — Telephone Encounter (Signed)
SPOKE TO PATIENT ADVISED HER THAT RX HAS BEEN SENT TO PHARMACY. Rhonda Cunningham,CMA

## 2017-07-27 NOTE — Telephone Encounter (Signed)
I refilled them today

## 2017-07-31 ENCOUNTER — Ambulatory Visit (INDEPENDENT_AMBULATORY_CARE_PROVIDER_SITE_OTHER): Payer: Medicare Other | Admitting: Family Medicine

## 2017-07-31 ENCOUNTER — Encounter: Payer: Self-pay | Admitting: Family Medicine

## 2017-07-31 VITALS — BP 134/76 | HR 102 | Wt 152.0 lb

## 2017-07-31 DIAGNOSIS — F332 Major depressive disorder, recurrent severe without psychotic features: Secondary | ICD-10-CM | POA: Diagnosis not present

## 2017-07-31 DIAGNOSIS — F329 Major depressive disorder, single episode, unspecified: Secondary | ICD-10-CM | POA: Diagnosis not present

## 2017-07-31 DIAGNOSIS — M62838 Other muscle spasm: Secondary | ICD-10-CM

## 2017-07-31 DIAGNOSIS — F32A Depression, unspecified: Secondary | ICD-10-CM

## 2017-07-31 DIAGNOSIS — F419 Anxiety disorder, unspecified: Secondary | ICD-10-CM | POA: Diagnosis not present

## 2017-07-31 DIAGNOSIS — F324 Major depressive disorder, single episode, in partial remission: Secondary | ICD-10-CM

## 2017-07-31 MED ORDER — FENTANYL 12 MCG/HR TD PT72: 12.5000 ug | MEDICATED_PATCH | TRANSDERMAL | 0 refills | Status: DC

## 2017-07-31 NOTE — Patient Instructions (Addendum)
Thank you for coming in today. Please start home health PT.  Use a heating pad.  Try to move your shoulder.  Recheck as scheduled on Jan 14th.  Cancel the appointment on the 2nd.

## 2017-07-31 NOTE — Progress Notes (Signed)
[name] is a 83 y.o. female who presents to [facility] today for right trapezius pain.  [name] has a history of chronic thoracic and cervical DDD with chronic back and neck pain.  She was doing well in her normal state of health recently when she developed significant pain in her right trapezius area.  She describes it as stabbing.  She is tried some diclofenac gel and a heating pad which has helped.  Additionally she notes panic and depression.  She recently was started on Trintellix which has helped quite a bit.  She is concerned because she is trying to get a tier exemption through her health insurance and she has multiple drug allergies listed below.  She is tried multiple different depression and anxiety medicines which she has difficulty tolerating.  She tolerates Trintellix well.   Past Medical History:  Diagnosis Date  . Anxiety   . Arthritis    osteoarthritis  . Asthma   . C. difficile diarrhea   . Candida esophagitis ([place])   . Chronic sinusitis   . Depression   . Depression   . Diverticulosis   . DJD (degenerative joint disease)     L5 compression fracture  . Edema extremities   . Fibromyalgia   . GERD (gastroesophageal reflux disease)   . Gout   . Hyperlipidemia   . Hypertension   . Osteoarthritis   . Osteopenia   . Panic disorder   . Renal failure, unspecified   . Spinal stenosis of lumbar region   . Tubular adenoma of colon   . Wrist fracture, left    Past Surgical History:  Procedure Laterality Date  . ADENOIDECTOMY    . CARPAL TUNNEL RELEASE  2007, 2009   Bilateral  . CATARACT EXTRACTION, BILATERAL    . CHOANAL ADENIODECTOMY    . DIAGNOSTIC LARYNGOSCOPY N/A 11/05/2015   Procedure: DIAGNOSTIC LARYNGOSCOPY AND ESOPHAGOSCOPY;  Surgeon: Carrie Nunnery, MD;  Location: [facility];  Service: ENT;  Laterality: N/A;  . ganglion cyct  removal on fingers     right  . ganglion cyst  wrist    . KNEE ARTHROSCOPY     right  . NASAL SINUS SURGERY    . OPEN REDUCTION INTERNAL FIXATION (ORIF) DISTAL RADIAL FRACTURE Left 12/18/2016   Procedure: OPEN REDUCTION INTERNAL FIXATION (ORIF) DISTAL RADIAL FRACTURE;  Surgeon: Carrie Planas, MD;  Location: [facility];  Service: Orthopedics;  Laterality: Left;  . SHOULDER SURGERY  2004, 07/30/10, 01/2011   after her right humeral neck fracture, revision hemiarthroplasty 2004 for persistent pain, revision reverse arthroplasty December 2011 for persistent pain, incision and drainage with poly-exchange 01/26/2011 for possible prosthetic joint infection.  . TONSILLECTOMY AND ADENOIDECTOMY    . TOTAL ABDOMINAL HYSTERECTOMY    . TOTAL HIP ARTHROPLASTY  2004   right  . TOTAL KNEE ARTHROPLASTY  2006   right  . TOTAL SHOULDER REPLACEMENT  2002   right   Social History   Tobacco Use  . Smoking status: Never Smoker  . Smokeless tobacco: Never Used  Substance Use Topics  . Alcohol use: No   family history includes Bladder Cancer in her father; Breast cancer in her unknown relative; Colon polyps in her unknown relative; Depression in her son; Diabetes in her mother; Hypertension in her mother.  ROS as above:  Medications: Current Outpatient Medications  Medication Sig Dispense Refill  . ALPRAZolam (XANAX) 0.5 MG tablet Take 1 tablet (  0.5 mg total) by mouth 3 (three) times daily as needed for anxiety. 90 tablet 3  . AMBULATORY NON FORMULARY MEDICATION Rolling [place].  Dx: Rheumatoid Arthritis.  Use daily as needed for ambulation. 1 Units 0  . amLODipine (NORVASC) 10 MG tablet TAKE 1 TABLET BY MOUTH ONCE DAILY 90 tablet 0  . Calcium Carbonate-Vitamin D (CALCIUM 600+D) 600-400 MG-UNIT per tablet Take 1 tablet by mouth 2 (two) times daily.    . diclofenac sodium (VOLTAREN) 1 % GEL Apply 4 g topically 4 (four) times daily. To affected joint. 100 g 11  . diphenhydrAMINE (BENADRYL) 25 MG tablet Take 25  mg by mouth every 6 (six) hours as needed. For allergies    . fentaNYL (DURAGESIC - DOSED MCG/HR) 12 MCG/HR Place 1 patch (12.5 mcg total) onto the skin every 3 (three) days. 10 patch 0  . hydrochlorothiazide (HYDRODIURIL) 25 MG tablet TAKE 1 TABLET BY MOUTH ONCE DAILY IN THE MORNING FOR BLOOD PRESSURE 90 tablet 0  . metroNIDAZOLE (METROGEL) 0.75 % gel Apply 1 application topically 2 (two) times daily. 45 g 3  . mirtazapine (REMERON) 30 MG tablet Take 1 tablet (30 mg total) by mouth at bedtime. 90 tablet 1  . montelukast (SINGULAIR) 10 MG tablet Take 1 tablet (10 mg total) by mouth at bedtime. 90 tablet 3  . mupirocin ointment (BACTROBAN) 2 % Apply to affected area TID for 7 days. 30 g 3  . omeprazole (PRILOSEC) 20 MG capsule Take 20 mg by mouth daily.  0  . ondansetron (ZOFRAN) 4 MG tablet Take 1 tablet (4 mg total) by mouth every 8 (eight) hours as needed for nausea or vomiting. 20 tablet 12  . Oxycodone HCl 10 MG TABS Take 1 tablet (10 mg total) by mouth 3 (three) times daily as needed (for pain). 90 tablet 0  . polyethylene glycol (MIRALAX / GLYCOLAX) packet Take 17 g by mouth daily. 14 each 0  . potassium chloride SA (K-DUR,KLOR-CON) 20 MEQ tablet Take 1 tablet (20 mEq total) by mouth daily. 90 tablet 1  . simvastatin (ZOCOR) 10 MG tablet TAKE 1 TABLET BY MOUTH ONCE DAILY 90 tablet 0  . vortioxetine HBr (TRINTELLIX) 5 MG TABS Take 1 tablet (5 mg total) by mouth daily. 30 tablet 1  . zoledronic acid (RECLAST) 5 MG/100ML SOLN Inject 5 mg into the vein. Once yearly. In June     No current facility-administered medications for this visit.    Allergies  Allergen Reactions  . Bee Venom Anaphylaxis    Has epi-pen  . Ceftin [Cefuroxime Axetil] Anaphylaxis  . Ciprofloxacin Anaphylaxis and Palpitations  . Ephedrine Other (See Comments) and Palpitations    "knocks me out"  . Sulfonamide Derivatives Anaphylaxis  . Telithromycin Anaphylaxis    Reaction: also blurred vision   . Valsartan  Anaphylaxis  . Verapamil Anaphylaxis  . Venlafaxine Other (See Comments)    Insomnia, panic  . Beta Adrenergic Blockers     bradycardia  . Cephalosporins     Allergic Reaction  . Clindamycin/Lincomycin     Dry skin and itching/ Cdiff  . Cymbalta [Duloxetine Hcl] Other (See Comments)    Reaction:Confusion and " I fall down"  . Doxazosin     "blurred vision and irritable"  . Gabapentin Other (See Comments)    Reaction: headache  . Hydralazine     HA, Diarrhea  . Lamotrigine     Patient unsure at this time  . Morphine Other (See Comments)    Medication  has no effect with pain  . Oxycodone-Acetaminophen Hives    Takes plain oxy IR at home (May 2018)  . Pregabalin Swelling  . Prochlorperazine Edisylate   . Pseudoephedrine Other (See Comments)    Reaction: hyperventilates and blacks out  . Relafen [Nabumetone] Swelling  . Zoloft [Sertraline] Other (See Comments)    "nervous/jittery"  . Doxycycline Hives    Health Maintenance Health Maintenance  Topic Date Due  . TETANUS/TDAP  02/20/2020  . INFLUENZA VACCINE  Completed  . DEXA SCAN  Completed  . PNA vac Low Risk Adult  Completed     Exam:  BP 134/76   Pulse (!) 102   Wt 152 lb (68.9 kg)   BMI 29.69 kg/m  Gen: Well NAD HEENT: EOMI,  MMM Lungs: Normal work of breathing. CTABL Heart: RRR no MRG Abd: NABS, Soft. Nondistended, Nontender Exts: Brisk capillary refill, warm and well perfused.  MSK: Tender to palpation right trapezius.  Decreased trapezius motion due to pain. Psych: Alert and oriented tearful affect no SI or HI expressed.  No results found for this or any previous visit (from the past 72 hour(s)). No results found.    Assessment and Plan: 83 y.o. female with right trapezius spasm.  Plan to refer to physical therapy for home health therapy exercises.  Recheck in a few weeks as previously scheduled. Fentanyl refilled as part of chronic pain management  Anxiety depression doing reasonably well.   Continue trintellix. Tier exemption filled out and faxed today.     Orders Placed This Encounter  Procedures  . Ambulatory referral to Home Health    Referral Priority:   Routine    Referral Type:   Home Health Care    Referral Reason:   Specialty Services Required    Requested Specialty:   [place]    Number of Visits Requested:   1   Meds ordered this encounter  Medications  . fentaNYL (DURAGESIC - DOSED MCG/HR) 12 MCG/HR    Sig: Place 1 patch (12.5 mcg total) onto the skin every 3 (three) days.    Dispense:  10 patch    Refill:  0     Discussed warning signs or symptoms. Please see discharge instructions. Patient expresses understanding.  I spent 25 minutes with this patient, greater than 50% was face-to-face time counseling regarding ddx and treatment.

## 2017-08-02 ENCOUNTER — Ambulatory Visit: Payer: Self-pay | Admitting: Family Medicine

## 2017-08-03 DIAGNOSIS — M62838 Other muscle spasm: Secondary | ICD-10-CM | POA: Diagnosis not present

## 2017-08-03 DIAGNOSIS — I129 Hypertensive chronic kidney disease with stage 1 through stage 4 chronic kidney disease, or unspecified chronic kidney disease: Secondary | ICD-10-CM | POA: Diagnosis not present

## 2017-08-03 DIAGNOSIS — F329 Major depressive disorder, single episode, unspecified: Secondary | ICD-10-CM | POA: Diagnosis not present

## 2017-08-03 DIAGNOSIS — M810 Age-related osteoporosis without current pathological fracture: Secondary | ICD-10-CM | POA: Diagnosis not present

## 2017-08-03 DIAGNOSIS — M5030 Other cervical disc degeneration, unspecified cervical region: Secondary | ICD-10-CM | POA: Diagnosis not present

## 2017-08-03 DIAGNOSIS — N183 Chronic kidney disease, stage 3 (moderate): Secondary | ICD-10-CM | POA: Diagnosis not present

## 2017-08-03 DIAGNOSIS — Z96611 Presence of right artificial shoulder joint: Secondary | ICD-10-CM | POA: Diagnosis not present

## 2017-08-03 DIAGNOSIS — Z96651 Presence of right artificial knee joint: Secondary | ICD-10-CM | POA: Diagnosis not present

## 2017-08-03 DIAGNOSIS — Z96641 Presence of right artificial hip joint: Secondary | ICD-10-CM | POA: Diagnosis not present

## 2017-08-03 DIAGNOSIS — Z9181 History of falling: Secondary | ICD-10-CM | POA: Diagnosis not present

## 2017-08-03 DIAGNOSIS — M5134 Other intervertebral disc degeneration, thoracic region: Secondary | ICD-10-CM | POA: Diagnosis not present

## 2017-08-03 DIAGNOSIS — J45909 Unspecified asthma, uncomplicated: Secondary | ICD-10-CM | POA: Diagnosis not present

## 2017-08-03 DIAGNOSIS — R7303 Prediabetes: Secondary | ICD-10-CM | POA: Diagnosis not present

## 2017-08-03 DIAGNOSIS — M1712 Unilateral primary osteoarthritis, left knee: Secondary | ICD-10-CM | POA: Diagnosis not present

## 2017-08-03 DIAGNOSIS — M069 Rheumatoid arthritis, unspecified: Secondary | ICD-10-CM | POA: Diagnosis not present

## 2017-08-03 DIAGNOSIS — E785 Hyperlipidemia, unspecified: Secondary | ICD-10-CM | POA: Diagnosis not present

## 2017-08-03 DIAGNOSIS — M48061 Spinal stenosis, lumbar region without neurogenic claudication: Secondary | ICD-10-CM | POA: Diagnosis not present

## 2017-08-03 DIAGNOSIS — F419 Anxiety disorder, unspecified: Secondary | ICD-10-CM | POA: Diagnosis not present

## 2017-08-04 ENCOUNTER — Encounter: Payer: Self-pay | Admitting: Family Medicine

## 2017-08-07 ENCOUNTER — Other Ambulatory Visit: Payer: Self-pay | Admitting: Family Medicine

## 2017-08-08 DIAGNOSIS — M62838 Other muscle spasm: Secondary | ICD-10-CM | POA: Diagnosis not present

## 2017-08-08 DIAGNOSIS — N183 Chronic kidney disease, stage 3 (moderate): Secondary | ICD-10-CM | POA: Diagnosis not present

## 2017-08-08 DIAGNOSIS — J45909 Unspecified asthma, uncomplicated: Secondary | ICD-10-CM | POA: Diagnosis not present

## 2017-08-08 DIAGNOSIS — M5134 Other intervertebral disc degeneration, thoracic region: Secondary | ICD-10-CM | POA: Diagnosis not present

## 2017-08-08 DIAGNOSIS — M5030 Other cervical disc degeneration, unspecified cervical region: Secondary | ICD-10-CM | POA: Diagnosis not present

## 2017-08-08 DIAGNOSIS — I129 Hypertensive chronic kidney disease with stage 1 through stage 4 chronic kidney disease, or unspecified chronic kidney disease: Secondary | ICD-10-CM | POA: Diagnosis not present

## 2017-08-10 DIAGNOSIS — M5030 Other cervical disc degeneration, unspecified cervical region: Secondary | ICD-10-CM | POA: Diagnosis not present

## 2017-08-10 DIAGNOSIS — M5134 Other intervertebral disc degeneration, thoracic region: Secondary | ICD-10-CM | POA: Diagnosis not present

## 2017-08-10 DIAGNOSIS — I129 Hypertensive chronic kidney disease with stage 1 through stage 4 chronic kidney disease, or unspecified chronic kidney disease: Secondary | ICD-10-CM | POA: Diagnosis not present

## 2017-08-10 DIAGNOSIS — N183 Chronic kidney disease, stage 3 (moderate): Secondary | ICD-10-CM | POA: Diagnosis not present

## 2017-08-10 DIAGNOSIS — J45909 Unspecified asthma, uncomplicated: Secondary | ICD-10-CM | POA: Diagnosis not present

## 2017-08-10 DIAGNOSIS — M62838 Other muscle spasm: Secondary | ICD-10-CM | POA: Diagnosis not present

## 2017-08-14 ENCOUNTER — Other Ambulatory Visit: Payer: Self-pay | Admitting: Family Medicine

## 2017-08-14 ENCOUNTER — Encounter: Payer: Self-pay | Admitting: Family Medicine

## 2017-08-14 ENCOUNTER — Ambulatory Visit: Payer: Self-pay | Admitting: Family Medicine

## 2017-08-14 ENCOUNTER — Ambulatory Visit (INDEPENDENT_AMBULATORY_CARE_PROVIDER_SITE_OTHER): Payer: Medicare Other | Admitting: Family Medicine

## 2017-08-14 VITALS — BP 148/79 | HR 89 | Wt 153.0 lb

## 2017-08-14 DIAGNOSIS — M5134 Other intervertebral disc degeneration, thoracic region: Secondary | ICD-10-CM | POA: Diagnosis not present

## 2017-08-14 DIAGNOSIS — I129 Hypertensive chronic kidney disease with stage 1 through stage 4 chronic kidney disease, or unspecified chronic kidney disease: Secondary | ICD-10-CM | POA: Diagnosis not present

## 2017-08-14 DIAGNOSIS — R7989 Other specified abnormal findings of blood chemistry: Secondary | ICD-10-CM

## 2017-08-14 DIAGNOSIS — R945 Abnormal results of liver function studies: Secondary | ICD-10-CM

## 2017-08-14 DIAGNOSIS — N183 Chronic kidney disease, stage 3 unspecified: Secondary | ICD-10-CM

## 2017-08-14 DIAGNOSIS — E785 Hyperlipidemia, unspecified: Secondary | ICD-10-CM

## 2017-08-14 DIAGNOSIS — M62838 Other muscle spasm: Secondary | ICD-10-CM

## 2017-08-14 DIAGNOSIS — R7303 Prediabetes: Secondary | ICD-10-CM

## 2017-08-14 DIAGNOSIS — I10 Essential (primary) hypertension: Secondary | ICD-10-CM | POA: Diagnosis not present

## 2017-08-14 DIAGNOSIS — M5030 Other cervical disc degeneration, unspecified cervical region: Secondary | ICD-10-CM | POA: Diagnosis not present

## 2017-08-14 DIAGNOSIS — J45909 Unspecified asthma, uncomplicated: Secondary | ICD-10-CM | POA: Diagnosis not present

## 2017-08-14 MED ORDER — ALBUTEROL SULFATE HFA 108 (90 BASE) MCG/ACT IN AERS: 2.0000 | INHALATION_SPRAY | Freq: Four times a day (QID) | RESPIRATORY_TRACT | 0 refills | Status: DC | PRN

## 2017-08-14 MED ORDER — OXYCODONE HCL 10 MG PO TABS: ORAL_TABLET | ORAL | 0 refills | Status: DC

## 2017-08-14 MED ORDER — VORTIOXETINE HBR 5 MG PO TABS: 5.0000 mg | ORAL_TABLET | Freq: Every day | ORAL | 12 refills | Status: DC

## 2017-08-14 MED ORDER — TRIAMTERENE-HCTZ 37.5-25 MG PO TABS: 1.0000 | ORAL_TABLET | Freq: Every day | ORAL | 3 refills | Status: DC

## 2017-08-14 MED ORDER — FENTANYL 12 MCG/HR TD PT72: 12.5000 ug | MEDICATED_PATCH | TRANSDERMAL | 0 refills | Status: DC

## 2017-08-14 MED ORDER — DICLOFENAC SODIUM 1 % TD GEL: 4.0000 g | Freq: Four times a day (QID) | TRANSDERMAL | 99 refills | Status: DC

## 2017-08-14 MED ORDER — ALPRAZOLAM 0.5 MG PO TABS: ORAL_TABLET | ORAL | 3 refills | Status: DC

## 2017-08-14 NOTE — Patient Instructions (Addendum)
Thank you for coming in today. STOP HCTZ and Potassium.  Start Maxzide.  We will want to check labs at the next visit.  Get fasting labs in the near future.  Recheck in about 1 month.  Return sooner if needed.    Hydrochlorothiazide, HCTZ; Triamterene tablets or capsules What is this medicine? HYDROCHLOROTHIAZIDE; TRIAMTERENE (hye droe klor oh THYE a zide; trye AM ter een) is a diuretic. It helps you make more urine and lose the extra water from your body. This medicine is used to treat high blood pressure and edema or swelling from excess water. This medicine may be used for other purposes; ask your health care provider or pharmacist if you have questions. COMMON BRAND NAME(S): Dyazide, Maxzide What should I tell my health care provider before I take this medicine? They need to know if you have any of these conditions: -diabetes -immune system problems, like lupus -kidney disease or stones -liver disease -small amount of urine or difficulty passing urine -an unusual or allergic reaction to triamterene, hydrochlorothiazide, sulfa drugs, other medicines, foods, dyes, or preservatives -pregnant or trying to get pregnant -breast-feeding How should I use this medicine? Take this medicine by mouth with a glass of water. Follow the directions on your prescription label. Take your medicine at regular intervals. Do not take it more often than directed. Do not stop taking except on your doctor's advice. Remember that you will need to pass urine frequently after taking this medicine. Do not take your doses at a time of day that will cause you problems. Do not take at bedtime. Talk to your pediatrician regarding the use of this medicine in children. Special care may be needed. Overdosage: If you think you have taken too much of this medicine contact a poison control center or emergency room at once. NOTE: This medicine is only for you. Do not share this medicine with others. What if I miss a  dose? If you miss a dose, take it as soon as you can. If it is almost time for your next dose, take only that dose. Do not take double or extra doses. What may interact with this medicine? Do not take this medicine with any of the following medications: -eplerenone This medicine may also interact with the following medications: -cyclosporine -heart medicines like ACE inhibitors, digoxin, dofetilide, eplerenone, angiotensin II antagonists, and medicines for blood pressure -lithium -medicines for diabetes -medicines for inflammation like indomethacin -medicines that relax muscles for surgery -other diuretics -potassium -sotalol -tacrolimus This list may not describe all possible interactions. Give your health care provider a list of all the medicines, herbs, non-prescription drugs, or dietary supplements you use. Also tell them if you smoke, drink alcohol, or use illegal drugs. Some items may interact with your medicine. What should I watch for while using this medicine? Visit your doctor or health care professional for regular check ups. You will need lab work done before you start this medicine and regularly while you are taking it. Check your blood pressure regularly. Ask your health care professional what your blood pressure should be, and when you should contact them. If you are a diabetic, check your blood sugar as directed. Do not stop taking your medicine unless your doctor tells you to. You may need to be on a special diet while taking this medicine. Ask your doctor. Also, ask how many glasses of fluid you need to drink a day. You must not get dehydrated. You may get drowsy or dizzy. Do not drive,  use machinery, or do anything that needs mental alertness until you know how this medicine affects you. Do not stand or sit up quickly, especially if you are an older patient. This reduces the risk of dizzy or fainting spells. Alcohol may interfere with the effect of this medicine. Avoid or limit  alcoholic drinks. This medicine can make you more sensitive to the sun. Keep out of the sun. If you cannot avoid being in the sun, wear protective clothing and use sunscreen. Do not use sun lamps or tanning beds/booths. What side effects may I notice from receiving this medicine? Side effects that you should report to your doctor or health care professional as soon as possible: -allergic reactions such as skin rash or itching, hives, swelling of the lips, mouth, tongue, or throat -changes in vision -eye pain -fast or irregular heartbeat, chest pain -feeling faint or dizzy -gout attack -muscle pain or cramps -numbness or tingling in hands, feet, or lips -pain or difficulty when passing urine -redness, blistering, peeling or loosening of the skin, including inside the mouth -shortness of breath -unusually weak or tired Side effects that usually do not require medical attention (report to your doctor or health care professional if they continue or are bothersome): -change in sex drive or performance -dry mouth -headache -stomach upset This list may not describe all possible side effects. Call your doctor for medical advice about side effects. You may report side effects to FDA at 1-800-FDA-1088. Where should I keep my medicine? Keep out of the reach of children. Store at room temperature between 15 and 30 degrees C (59 and 86 degrees F). Protect from light. Throw away any unused medicine after the expiration date. NOTE: This sheet is a summary. It may not cover all possible information. If you have questions about this medicine, talk to your doctor, pharmacist, or health care provider.  2018 [employer]vier/Gold Standard (2010-04-07 14:54:04)

## 2017-08-14 NOTE — Progress Notes (Signed)
[name] is a 84 y.o. female who presents to [facility] [place] today for follow up anxiety/depression and discuss chronic pain.   [name] notes that with Trintellix she has had significantly improved anxiety and depression symptoms.  She continues to experience episode anxiety and depression and panic attacks but notes that her symptoms are much more improved and they have been in the past.   Additionally [name] has chronic pain syndrome.  This is managed typically with limited oxycodone and fentanyl patches.  She notes that this is working reasonably well for her and she would like to continue.  She denies significant obnoxious side effects such as severe constipation or fatigue  Additionally [name] notes continued left trapezius muscle spasm.  She has received some physical therapy for this at home but notes that it is about to run out and she notes that she would like a reauthorization therapy and she finds it to be helpful  Hypertension: [name] has hypertension which is managed with hydrochlorothiazide.  She takes potassium to replete low potassium levels.  She finds the oral potassium pills nauseating.  No chest pain palpitations or shortness of breath.  Cough: [name] has a chronic cough.  This is been ongoing for some time now.  She notes in the past she had some asthma which is managed well with albuterol and is interested she denies significant runny nose.  Denies any acid reflux.  Past Medical History:  Diagnosis Date  . Anxiety   . Arthritis    osteoarthritis  . Asthma   . C. difficile diarrhea   . Candida esophagitis ([place])   . Chronic sinusitis   . Depression   . Depression   . Diverticulosis   . DJD (degenerative joint disease)     L5 compression fracture  . Edema extremities   . Fibromyalgia   . GERD (gastroesophageal reflux disease)   . Gout     . Hyperlipidemia   . Hypertension   . Osteoarthritis   . Osteopenia   . Panic disorder   . Renal failure, unspecified   . Spinal stenosis of lumbar region   . Tubular adenoma of colon   . Wrist fracture, left    Past Surgical History:  Procedure Laterality Date  . ADENOIDECTOMY    . CARPAL TUNNEL RELEASE  2007, 2009   Bilateral  . CATARACT EXTRACTION, BILATERAL    . CHOANAL ADENIODECTOMY    . DIAGNOSTIC LARYNGOSCOPY N/A 11/05/2015   Procedure: DIAGNOSTIC LARYNGOSCOPY AND ESOPHAGOSCOPY;  Surgeon: Rozetta Nunnery, MD;  Location: [facility];  Service: ENT;  Laterality: N/A;  . ganglion cyct removal on fingers     right  . ganglion cyst  wrist    . KNEE ARTHROSCOPY     right  . NASAL SINUS SURGERY    . OPEN REDUCTION INTERNAL FIXATION (ORIF) DISTAL RADIAL FRACTURE Left 12/18/2016   Procedure: OPEN REDUCTION INTERNAL FIXATION (ORIF) DISTAL RADIAL FRACTURE;  Surgeon: Iran Planas, MD;  Location: [facility];  Service: Orthopedics;  Laterality: Left;  . SHOULDER SURGERY  2004, 07/30/10, 01/2011   after her right humeral neck fracture, revision hemiarthroplasty 2004 for persistent pain, revision reverse arthroplasty December 2011 for persistent pain, incision and drainage with poly-exchange 01/26/2011 for possible prosthetic joint infection.  . TONSILLECTOMY AND ADENOIDECTOMY    . TOTAL ABDOMINAL HYSTERECTOMY    . TOTAL HIP ARTHROPLASTY  2004   right  . TOTAL KNEE ARTHROPLASTY  2006  right  . TOTAL SHOULDER REPLACEMENT  2002   right   Social History   Tobacco Use  . Smoking status: Never Smoker  . Smokeless tobacco: Never Used  Substance Use Topics  . Alcohol use: No   family history includes Bladder Cancer in her father; Breast cancer in her unknown relative; Colon polyps in her unknown relative; Depression in her son; Diabetes in her mother; Hypertension in her mother.  ROS as above:  Medications: Current Outpatient Medications  Medication Sig Dispense Refill  . ALPRAZolam  (XANAX) 0.5 MG tablet Take 1 pill 3x daily for anxeity as needed. May take a 4th pill occasionally as needed. 100 tablet 3  . AMBULATORY NON FORMULARY MEDICATION [place].  Dx: Rheumatoid Arthritis.  Use daily as needed for ambulation. 1 Units 0  . amLODipine (NORVASC) 10 MG tablet TAKE 1 TABLET BY MOUTH ONCE DAILY 90 tablet 0  . Calcium Carbonate-Vitamin D (CALCIUM 600+D) 600-400 MG-UNIT per tablet Take 1 tablet by mouth 2 (two) times daily.    . diclofenac sodium (VOLTAREN) 1 % GEL Apply 4 g topically 4 (four) times daily. To affected joint. 500 g 99  . diphenhydrAMINE (BENADRYL) 25 MG tablet Take 25 mg by mouth every 6 (six) hours as needed. For allergies    . fentaNYL (DURAGESIC - DOSED MCG/HR) 12 MCG/HR Place 1 patch (12.5 mcg total) onto the skin every 3 (three) days. Fill on or after the 30th 10 patch 0  . metroNIDAZOLE (METROGEL) 0.75 % gel Apply 1 application topically 2 (two) times daily. 45 g 3  . mirtazapine (REMERON) 30 MG tablet Take 1 tablet (30 mg total) by mouth at bedtime. 90 tablet 1  . montelukast (SINGULAIR) 10 MG tablet Take 1 tablet (10 mg total) by mouth at bedtime. 90 tablet 3  . mupirocin ointment (BACTROBAN) 2 % Apply to affected area TID for 7 days. 30 g 3  . omeprazole (PRILOSEC) 20 MG capsule Take 20 mg by mouth daily.  0  . ondansetron (ZOFRAN) 4 MG tablet Take 1 tablet (4 mg total) by mouth every 8 (eight) hours as needed for nausea or vomiting. 20 tablet 12  . Oxycodone HCl 10 MG TABS Take 10mg  po 2-3x per day as needed for pain 70 tablet 0  . polyethylene glycol (MIRALAX / GLYCOLAX) packet Take 17 g by mouth daily. 14 each 0  . simvastatin (ZOCOR) 10 MG tablet TAKE 1 TABLET BY MOUTH ONCE DAILY 90 tablet 0  . vortioxetine HBr (TRINTELLIX) 5 MG TABS Take 1 tablet (5 mg total) by mouth daily. 90 tablet 12  . zoledronic acid (RECLAST) 5 MG/100ML SOLN Inject 5 mg into the vein. Once yearly. In June    . albuterol (PROVENTIL HFA;VENTOLIN HFA) 108 (90 Base) MCG/ACT  inhaler Inhale 2 puffs into the lungs every 6 (six) hours as needed for wheezing or shortness of breath. 1 Inhaler 0  . triamterene-hydrochlorothiazide (MAXZIDE-25) 37.5-25 MG tablet Take 1 tablet by mouth daily. 90 tablet 3   No current facility-administered medications for this visit.    Allergies  Allergen Reactions  . Bee Venom Anaphylaxis    Has epi-pen  . Ceftin [Cefuroxime Axetil] Anaphylaxis  . Ciprofloxacin Anaphylaxis and Palpitations  . Ephedrine Other (See Comments) and Palpitations    "knocks me out"  . Sulfonamide Derivatives Anaphylaxis  . Telithromycin Anaphylaxis    Reaction: also blurred vision   . Valsartan Anaphylaxis  . Verapamil Anaphylaxis  . Venlafaxine Other (See Comments)  Insomnia, panic  . Beta Adrenergic Blockers     bradycardia  . Cephalosporins     Allergic Reaction  . Clindamycin/Lincomycin     Dry skin and itching/ Cdiff  . Cymbalta [Duloxetine Hcl] Other (See Comments)    Reaction:Confusion and " I fall down"  . Doxazosin     "blurred vision and irritable"  . Gabapentin Other (See Comments)    Reaction: headache  . Hydralazine     HA, Diarrhea  . Lamotrigine     Patient unsure at this time  . Morphine Other (See Comments)    Medication has no effect with pain  . Oxycodone-Acetaminophen Hives    Takes plain oxy IR at home (May 2018)  . Pregabalin Swelling  . Prochlorperazine Edisylate   . Pseudoephedrine Other (See Comments)    Reaction: hyperventilates and blacks out  . Relafen [Nabumetone] Swelling  . Zoloft [Sertraline] Other (See Comments)    "nervous/jittery"  . Doxycycline Hives    Health Maintenance Health Maintenance  Topic Date Due  . TETANUS/TDAP  02/20/2020  . INFLUENZA VACCINE  Completed  . DEXA SCAN  Completed  . PNA vac Low Risk Adult  Completed     Exam:  BP (!) 148/79   Pulse 89   Wt 153 lb (69.4 kg)   BMI 29.88 kg/m  Gen: Well NAD HEENT: EOMI,  MMM Lungs: Normal work of breathing. CTABL Heart: RRR  no MRG Abd: NABS, Soft. Nondistended, Nontender Exts: Brisk capillary refill, warm and well perfused.  MSK.  Significant thoracic kyphosis.  Tender to palpation bilateral trapezius worse on the right. Psych: Alert and oriented normal speech thought process and affect no SI or HI expressed   No results found for this or any previous visit (from the past 25 hour(s)). No results found.    Assessment and Plan: 84 y.o. female with  Anxiety and depression: Doing well with Trintellix.  Continue current regimen.  Chronic pain doing reasonably well continue fentanyl and oxycodone  Trapezius muscle spasm: Authorize physical therapy  Hypertension: Switch to Maxzide.  Discontinue hydrochlorothiazide and potassium supplementation.  Recheck basic fasting labs listed below.  Chronic cough: Trial of albuterol.  Recheck in 1 month if not better will repeat chest x-ray  We will recheck A1c based on history of elevated A1c.  Orders Placed This Encounter  Procedures  . CBC  . COMPLETE METABOLIC PANEL WITH GFR  . Lipid Panel w/reflex Direct LDL  . Hemoglobin A1c  . Ambulatory referral to Home Health    Referral Priority:   Routine    Referral Type:   Home Health Care    Referral Reason:   Specialty Services Required    Requested Specialty:   [facility]    Number of Visits Requested:   1   Meds ordered this encounter  Medications  . albuterol (PROVENTIL HFA;VENTOLIN HFA) 108 (90 Base) MCG/ACT inhaler    Sig: Inhale 2 puffs into the lungs every 6 (six) hours as needed for wheezing or shortness of breath.    Dispense:  1 Inhaler    Refill:  0  . ALPRAZolam (XANAX) 0.5 MG tablet    Sig: Take 1 pill 3x daily for anxeity as needed. May take a 4th pill occasionally as needed.    Dispense:  100 tablet    Refill:  3  . fentaNYL (DURAGESIC - DOSED MCG/HR) 12 MCG/HR    Sig: Place 1 patch (12.5 mcg total) onto the skin every 3 (three) days.  Fill on or after the 30th    Dispense:  10 patch     Refill:  0  . vortioxetine HBr (TRINTELLIX) 5 MG TABS    Sig: Take 1 tablet (5 mg total) by mouth daily.    Dispense:  90 tablet    Refill:  12  . diclofenac sodium (VOLTAREN) 1 % GEL    Sig: Apply 4 g topically 4 (four) times daily. To affected joint.    Dispense:  500 g    Refill:  99  . Oxycodone HCl 10 MG TABS    Sig: Take 10mg  po 2-3x per day as needed for pain    Dispense:  70 tablet    Refill:  0  . triamterene-hydrochlorothiazide (MAXZIDE-25) 37.5-25 MG tablet    Sig: Take 1 tablet by mouth daily.    Dispense:  90 tablet    Refill:  3     Discussed warning signs or symptoms. Please see discharge instructions. Patient expresses understanding.

## 2017-08-16 ENCOUNTER — Telehealth: Payer: Self-pay | Admitting: *Deleted

## 2017-08-16 ENCOUNTER — Ambulatory Visit (HOSPITAL_COMMUNITY): Payer: Self-pay | Admitting: Psychiatry

## 2017-08-16 DIAGNOSIS — J45909 Unspecified asthma, uncomplicated: Secondary | ICD-10-CM | POA: Diagnosis not present

## 2017-08-16 DIAGNOSIS — M5134 Other intervertebral disc degeneration, thoracic region: Secondary | ICD-10-CM | POA: Diagnosis not present

## 2017-08-16 DIAGNOSIS — N183 Chronic kidney disease, stage 3 (moderate): Secondary | ICD-10-CM | POA: Diagnosis not present

## 2017-08-16 DIAGNOSIS — I129 Hypertensive chronic kidney disease with stage 1 through stage 4 chronic kidney disease, or unspecified chronic kidney disease: Secondary | ICD-10-CM | POA: Diagnosis not present

## 2017-08-16 DIAGNOSIS — M62838 Other muscle spasm: Secondary | ICD-10-CM | POA: Diagnosis not present

## 2017-08-16 DIAGNOSIS — M5030 Other cervical disc degeneration, unspecified cervical region: Secondary | ICD-10-CM | POA: Diagnosis not present

## 2017-08-16 NOTE — Telephone Encounter (Signed)
After review of Dr. Clovis Riley letter of appeal the insurance has approved coverage for Trintellix. Called the pharmacy and left a message with them.Patient's mailbox is full on # listed as cell. Left message on patient's home #

## 2017-08-21 ENCOUNTER — Telehealth: Payer: Self-pay

## 2017-08-21 DIAGNOSIS — I129 Hypertensive chronic kidney disease with stage 1 through stage 4 chronic kidney disease, or unspecified chronic kidney disease: Secondary | ICD-10-CM | POA: Diagnosis not present

## 2017-08-21 DIAGNOSIS — M62838 Other muscle spasm: Secondary | ICD-10-CM

## 2017-08-21 DIAGNOSIS — J45909 Unspecified asthma, uncomplicated: Secondary | ICD-10-CM | POA: Diagnosis not present

## 2017-08-21 DIAGNOSIS — M5134 Other intervertebral disc degeneration, thoracic region: Secondary | ICD-10-CM | POA: Diagnosis not present

## 2017-08-21 DIAGNOSIS — M5030 Other cervical disc degeneration, unspecified cervical region: Secondary | ICD-10-CM | POA: Diagnosis not present

## 2017-08-21 DIAGNOSIS — N183 Chronic kidney disease, stage 3 (moderate): Secondary | ICD-10-CM | POA: Diagnosis not present

## 2017-08-21 NOTE — Telephone Encounter (Signed)
Deal Nurse called in requesting verbal orders for extension of home therapy for the following: 1. Balance/gait therapy 2. Neck exercises for neck pain  Once a week for 1 week Twice a week for 1 week Once a week for 1 week

## 2017-08-22 ENCOUNTER — Ambulatory Visit (HOSPITAL_COMMUNITY): Payer: Medicare Other | Admitting: Licensed Clinical Social Worker

## 2017-08-22 NOTE — Telephone Encounter (Signed)
OK to authorize verbal orderes

## 2017-08-22 NOTE — Telephone Encounter (Signed)
Left a message on [facility] vm advising ok to verbal order for patient. Rhonda Cunningham,CMA

## 2017-08-24 DIAGNOSIS — M62838 Other muscle spasm: Secondary | ICD-10-CM | POA: Diagnosis not present

## 2017-08-24 DIAGNOSIS — M5030 Other cervical disc degeneration, unspecified cervical region: Secondary | ICD-10-CM | POA: Diagnosis not present

## 2017-08-24 DIAGNOSIS — N183 Chronic kidney disease, stage 3 (moderate): Secondary | ICD-10-CM | POA: Diagnosis not present

## 2017-08-24 DIAGNOSIS — M5134 Other intervertebral disc degeneration, thoracic region: Secondary | ICD-10-CM | POA: Diagnosis not present

## 2017-08-24 DIAGNOSIS — J45909 Unspecified asthma, uncomplicated: Secondary | ICD-10-CM | POA: Diagnosis not present

## 2017-08-24 DIAGNOSIS — I129 Hypertensive chronic kidney disease with stage 1 through stage 4 chronic kidney disease, or unspecified chronic kidney disease: Secondary | ICD-10-CM | POA: Diagnosis not present

## 2017-08-28 DIAGNOSIS — J45909 Unspecified asthma, uncomplicated: Secondary | ICD-10-CM | POA: Diagnosis not present

## 2017-08-28 DIAGNOSIS — I129 Hypertensive chronic kidney disease with stage 1 through stage 4 chronic kidney disease, or unspecified chronic kidney disease: Secondary | ICD-10-CM | POA: Diagnosis not present

## 2017-08-28 DIAGNOSIS — N183 Chronic kidney disease, stage 3 (moderate): Secondary | ICD-10-CM | POA: Diagnosis not present

## 2017-08-28 DIAGNOSIS — M5030 Other cervical disc degeneration, unspecified cervical region: Secondary | ICD-10-CM | POA: Diagnosis not present

## 2017-08-28 DIAGNOSIS — M5134 Other intervertebral disc degeneration, thoracic region: Secondary | ICD-10-CM | POA: Diagnosis not present

## 2017-08-28 DIAGNOSIS — M62838 Other muscle spasm: Secondary | ICD-10-CM | POA: Diagnosis not present

## 2017-08-30 ENCOUNTER — Other Ambulatory Visit: Payer: Self-pay | Admitting: Family Medicine

## 2017-08-30 DIAGNOSIS — M62838 Other muscle spasm: Secondary | ICD-10-CM | POA: Diagnosis not present

## 2017-08-30 DIAGNOSIS — M5030 Other cervical disc degeneration, unspecified cervical region: Secondary | ICD-10-CM | POA: Diagnosis not present

## 2017-08-30 DIAGNOSIS — I129 Hypertensive chronic kidney disease with stage 1 through stage 4 chronic kidney disease, or unspecified chronic kidney disease: Secondary | ICD-10-CM | POA: Diagnosis not present

## 2017-08-30 DIAGNOSIS — J45909 Unspecified asthma, uncomplicated: Secondary | ICD-10-CM | POA: Diagnosis not present

## 2017-08-30 DIAGNOSIS — M5134 Other intervertebral disc degeneration, thoracic region: Secondary | ICD-10-CM | POA: Diagnosis not present

## 2017-08-30 DIAGNOSIS — N183 Chronic kidney disease, stage 3 (moderate): Secondary | ICD-10-CM | POA: Diagnosis not present

## 2017-09-04 DIAGNOSIS — M5030 Other cervical disc degeneration, unspecified cervical region: Secondary | ICD-10-CM | POA: Diagnosis not present

## 2017-09-04 DIAGNOSIS — M5134 Other intervertebral disc degeneration, thoracic region: Secondary | ICD-10-CM | POA: Diagnosis not present

## 2017-09-04 DIAGNOSIS — I129 Hypertensive chronic kidney disease with stage 1 through stage 4 chronic kidney disease, or unspecified chronic kidney disease: Secondary | ICD-10-CM | POA: Diagnosis not present

## 2017-09-04 DIAGNOSIS — N183 Chronic kidney disease, stage 3 (moderate): Secondary | ICD-10-CM | POA: Diagnosis not present

## 2017-09-04 DIAGNOSIS — M62838 Other muscle spasm: Secondary | ICD-10-CM | POA: Diagnosis not present

## 2017-09-04 DIAGNOSIS — J45909 Unspecified asthma, uncomplicated: Secondary | ICD-10-CM | POA: Diagnosis not present

## 2017-09-06 ENCOUNTER — Other Ambulatory Visit: Payer: Self-pay | Admitting: Family Medicine

## 2017-09-07 DIAGNOSIS — J45909 Unspecified asthma, uncomplicated: Secondary | ICD-10-CM | POA: Diagnosis not present

## 2017-09-07 DIAGNOSIS — N183 Chronic kidney disease, stage 3 (moderate): Secondary | ICD-10-CM | POA: Diagnosis not present

## 2017-09-07 DIAGNOSIS — M5134 Other intervertebral disc degeneration, thoracic region: Secondary | ICD-10-CM | POA: Diagnosis not present

## 2017-09-07 DIAGNOSIS — M62838 Other muscle spasm: Secondary | ICD-10-CM | POA: Diagnosis not present

## 2017-09-07 DIAGNOSIS — M5030 Other cervical disc degeneration, unspecified cervical region: Secondary | ICD-10-CM | POA: Diagnosis not present

## 2017-09-07 DIAGNOSIS — I129 Hypertensive chronic kidney disease with stage 1 through stage 4 chronic kidney disease, or unspecified chronic kidney disease: Secondary | ICD-10-CM | POA: Diagnosis not present

## 2017-09-11 DIAGNOSIS — E785 Hyperlipidemia, unspecified: Secondary | ICD-10-CM | POA: Diagnosis not present

## 2017-09-11 DIAGNOSIS — M5030 Other cervical disc degeneration, unspecified cervical region: Secondary | ICD-10-CM | POA: Diagnosis not present

## 2017-09-11 DIAGNOSIS — M5134 Other intervertebral disc degeneration, thoracic region: Secondary | ICD-10-CM | POA: Diagnosis not present

## 2017-09-11 DIAGNOSIS — N183 Chronic kidney disease, stage 3 (moderate): Secondary | ICD-10-CM | POA: Diagnosis not present

## 2017-09-11 DIAGNOSIS — R7303 Prediabetes: Secondary | ICD-10-CM | POA: Diagnosis not present

## 2017-09-11 DIAGNOSIS — I129 Hypertensive chronic kidney disease with stage 1 through stage 4 chronic kidney disease, or unspecified chronic kidney disease: Secondary | ICD-10-CM | POA: Diagnosis not present

## 2017-09-11 DIAGNOSIS — I10 Essential (primary) hypertension: Secondary | ICD-10-CM | POA: Diagnosis not present

## 2017-09-11 DIAGNOSIS — R945 Abnormal results of liver function studies: Secondary | ICD-10-CM | POA: Diagnosis not present

## 2017-09-11 DIAGNOSIS — M62838 Other muscle spasm: Secondary | ICD-10-CM | POA: Diagnosis not present

## 2017-09-11 DIAGNOSIS — J45909 Unspecified asthma, uncomplicated: Secondary | ICD-10-CM | POA: Diagnosis not present

## 2017-09-12 LAB — LIPID PANEL W/REFLEX DIRECT LDL
Cholesterol: 129 mg/dL (ref <200)
HDL: 38 mg/dL — ABNORMAL LOW (ref >50)
LDL CHOLESTEROL (CALC): 68 mg/dL
Non-HDL Cholesterol (Calc): 91 mg/dL (calc) (ref <130)
Total CHOL/HDL Ratio: 3.4 (calc) (ref <5.0)
Triglycerides: 143 mg/dL (ref <150)

## 2017-09-12 LAB — CBC
HCT: 43.1 % (ref 35.0–45.0)
Hemoglobin: 14.5 g/dL (ref 11.7–15.5)
MCH: 30 pg (ref 27.0–33.0)
MCHC: 33.6 g/dL (ref 32.0–36.0)
MCV: 89.2 fL (ref 80.0–100.0)
MPV: 11.5 fL (ref 7.5–12.5)
Platelets: 258 10*3/uL (ref 140–400)
RBC: 4.83 10*6/uL (ref 3.80–5.10)
RDW: 12.4 % (ref 11.0–15.0)
WBC: 9.8 10*3/uL (ref 3.8–10.8)

## 2017-09-12 LAB — COMPLETE METABOLIC PANEL WITH GFR
AG RATIO: 1.6 (calc) (ref 1.0–2.5)
ALBUMIN MSPROF: 4.4 g/dL (ref 3.6–5.1)
ALKALINE PHOSPHATASE (APISO): 101 U/L (ref 33–130)
ALT: 14 U/L (ref 6–29)
AST: 22 U/L (ref 10–35)
BILIRUBIN TOTAL: 0.6 mg/dL (ref 0.2–1.2)
BUN / CREAT RATIO: 13 (calc) (ref 6–22)
BUN: 17 mg/dL (ref 7–25)
CHLORIDE: 98 mmol/L (ref 98–110)
CO2: 30 mmol/L (ref 20–32)
Calcium: 9.9 mg/dL (ref 8.6–10.4)
Creat: 1.27 mg/dL — ABNORMAL HIGH (ref 0.60–0.88)
GFR, Est African American: 46 mL/min/{1.73_m2} — ABNORMAL LOW (ref >=60)
GFR, Est Non African American: 39 mL/min/{1.73_m2} — ABNORMAL LOW (ref >=60)
GLUCOSE: 106 mg/dL — AB (ref 65–99)
Globulin: 2.7 g/dL (calc) (ref 1.9–3.7)
POTASSIUM: 4.4 mmol/L (ref 3.5–5.3)
SODIUM: 138 mmol/L (ref 135–146)
Total Protein: 7.1 g/dL (ref 6.1–8.1)

## 2017-09-12 LAB — HEMOGLOBIN A1C
EAG (MMOL/L): 6.3 (calc)
Hgb A1c MFr Bld: 5.6 % of total Hgb (ref <5.7)
MEAN PLASMA GLUCOSE: 114 (calc)

## 2017-09-13 DIAGNOSIS — M5134 Other intervertebral disc degeneration, thoracic region: Secondary | ICD-10-CM | POA: Diagnosis not present

## 2017-09-13 DIAGNOSIS — M5030 Other cervical disc degeneration, unspecified cervical region: Secondary | ICD-10-CM | POA: Diagnosis not present

## 2017-09-13 DIAGNOSIS — M62838 Other muscle spasm: Secondary | ICD-10-CM | POA: Diagnosis not present

## 2017-09-13 DIAGNOSIS — J45909 Unspecified asthma, uncomplicated: Secondary | ICD-10-CM | POA: Diagnosis not present

## 2017-09-13 DIAGNOSIS — N183 Chronic kidney disease, stage 3 (moderate): Secondary | ICD-10-CM | POA: Diagnosis not present

## 2017-09-13 DIAGNOSIS — I129 Hypertensive chronic kidney disease with stage 1 through stage 4 chronic kidney disease, or unspecified chronic kidney disease: Secondary | ICD-10-CM | POA: Diagnosis not present

## 2017-09-15 ENCOUNTER — Encounter: Payer: Self-pay | Admitting: Family Medicine

## 2017-09-15 ENCOUNTER — Ambulatory Visit (INDEPENDENT_AMBULATORY_CARE_PROVIDER_SITE_OTHER): Payer: Medicare Other

## 2017-09-15 ENCOUNTER — Ambulatory Visit (INDEPENDENT_AMBULATORY_CARE_PROVIDER_SITE_OTHER): Payer: Medicare Other | Admitting: Family Medicine

## 2017-09-15 VITALS — BP 120/75 | HR 96 | Ht 60.0 in | Wt 147.0 lb

## 2017-09-15 DIAGNOSIS — F329 Major depressive disorder, single episode, unspecified: Secondary | ICD-10-CM | POA: Diagnosis not present

## 2017-09-15 DIAGNOSIS — M25532 Pain in left wrist: Secondary | ICD-10-CM

## 2017-09-15 DIAGNOSIS — M545 Low back pain, unspecified: Secondary | ICD-10-CM

## 2017-09-15 DIAGNOSIS — N183 Chronic kidney disease, stage 3 unspecified: Secondary | ICD-10-CM

## 2017-09-15 DIAGNOSIS — F419 Anxiety disorder, unspecified: Secondary | ICD-10-CM

## 2017-09-15 DIAGNOSIS — S52592A Other fractures of lower end of left radius, initial encounter for closed fracture: Secondary | ICD-10-CM | POA: Diagnosis not present

## 2017-09-15 DIAGNOSIS — F32A Depression, unspecified: Secondary | ICD-10-CM

## 2017-09-15 DIAGNOSIS — I10 Essential (primary) hypertension: Secondary | ICD-10-CM

## 2017-09-15 DIAGNOSIS — M19032 Primary osteoarthritis, left wrist: Secondary | ICD-10-CM | POA: Diagnosis not present

## 2017-09-15 DIAGNOSIS — G894 Chronic pain syndrome: Secondary | ICD-10-CM | POA: Diagnosis not present

## 2017-09-15 NOTE — Patient Instructions (Addendum)
Thank you for coming in today. Get xray Recheck as scheduled on in March.  Return sooner if needed.  Let me know if you need refills.

## 2017-09-15 NOTE — Progress Notes (Signed)
[name] is a 84 y.o. female who presents to [facility]: [facility] today for follow-up anxiety depression, wrist pain, trapezius pain, hypertension.  [name] has a history of difficult to control anxiety and depression.  She currently takes Remeron and Trintellix and occasional Xanax.  She notes she is doing much better than she had been.  She stabilized in the last several months.  She still has some continued anxiety depression symptoms but they no longer overwhelming and interfering with her ability to take care of herself at home.  Wrist pain: [name] has a history of left wrist fracture in May 2018.  This required ORIF.  She is continued to complain of pain in his wrist for months now.  She is tried bracing and topical diclofenac gel all of which help a little.  The swelling is gone down but the pain is still present and interfering with her ability to use her hand normally at times.  She denies any loss of function or weakness but notes that the pain can be quite severe at times despite diclofenac gel and pain medication listed below.  Trapezius pain: [name] was seen for trapezius pain several times last month.  She is been attending home physical therapy and notes considerable improvement in symptoms which have almost completely resolved.  Chronic back pain: [name] has chronic thoracic and low back pain for which she receives oxycodone and fentanyl below.  She has the medicines help her function better at home and she tolerates them well  Hypertension: [name] takes medications listed below for hypertension.  Tolerates medicines well with no chest pain palpitations shortness of breath lightheadedness or dizziness.  Past Medical History:  Diagnosis Date  . Anxiety   . Arthritis    osteoarthritis  . Asthma   . C. difficile diarrhea   . Candida esophagitis ([place])   .  Chronic sinusitis   . Depression   . Depression   . Diverticulosis   . DJD (degenerative joint disease)     L5 compression fracture  . Edema extremities   . Fibromyalgia   . GERD (gastroesophageal reflux disease)   . Gout   . Hyperlipidemia   . Hypertension   . Osteoarthritis   . Osteopenia   . Panic disorder   . Renal failure, unspecified   . Spinal stenosis of lumbar region   . Tubular adenoma of colon   . Wrist fracture, left    Past Surgical History:  Procedure Laterality Date  . ADENOIDECTOMY    . CARPAL TUNNEL RELEASE  2007, 2009   Bilateral  . CATARACT EXTRACTION, BILATERAL    . CHOANAL ADENIODECTOMY    . DIAGNOSTIC LARYNGOSCOPY N/A 11/05/2015   Procedure: DIAGNOSTIC LARYNGOSCOPY AND ESOPHAGOSCOPY;  Surgeon: Rozetta Nunnery, MD;  Location: [facility];  Service: ENT;  Laterality: N/A;  . ganglion cyct removal on fingers     right  . ganglion cyst  wrist    . KNEE ARTHROSCOPY     right  . NASAL SINUS SURGERY    . OPEN REDUCTION INTERNAL FIXATION (ORIF) DISTAL RADIAL FRACTURE Left 12/18/2016   Procedure: OPEN REDUCTION INTERNAL FIXATION (ORIF) DISTAL RADIAL FRACTURE;  Surgeon: Iran Planas, MD;  Location: [facility];  Service: Orthopedics;  Laterality: Left;  . SHOULDER SURGERY  2004, 07/30/10, 01/2011   after her right humeral neck fracture, revision hemiarthroplasty 2004 for persistent pain, revision reverse arthroplasty December 2011 for persistent pain, incision and drainage  with poly-exchange 01/26/2011 for possible prosthetic joint infection.  . TONSILLECTOMY AND ADENOIDECTOMY    . TOTAL ABDOMINAL HYSTERECTOMY    . TOTAL HIP ARTHROPLASTY  2004   right  . TOTAL KNEE ARTHROPLASTY  2006   right  . TOTAL SHOULDER REPLACEMENT  2002   right   Social History   Tobacco Use  . Smoking status: Never Smoker  . Smokeless tobacco: Never Used  Substance Use Topics  . Alcohol use: No   family history includes Bladder Cancer in her father; Breast cancer in her unknown  relative; Colon polyps in her unknown relative; Depression in her son; Diabetes in her mother; Hypertension in her mother.  ROS as above:  Medications: Current Outpatient Medications  Medication Sig Dispense Refill  . albuterol (PROVENTIL HFA;VENTOLIN HFA) 108 (90 Base) MCG/ACT inhaler Inhale 2 puffs into the lungs every 6 (six) hours as needed for wheezing or shortness of breath. 1 Inhaler 0  . ALPRAZolam (XANAX) 0.5 MG tablet Take 1 pill 3x daily for anxeity as needed. May take a 4th pill occasionally as needed. 100 tablet 3  . AMBULATORY NON FORMULARY MEDICATION [place].  Dx: Rheumatoid Arthritis.  Use daily as needed for ambulation. 1 Units 0  . amLODipine (NORVASC) 10 MG tablet TAKE 1 TABLET BY MOUTH ONCE DAILY 90 tablet 0  . Calcium Carbonate-Vitamin D (CALCIUM 600+D) 600-400 MG-UNIT per tablet Take 1 tablet by mouth 2 (two) times daily.    . diclofenac sodium (VOLTAREN) 1 % GEL Apply 4 g topically 4 (four) times daily. To affected joint. 500 g 99  . diphenhydrAMINE (BENADRYL) 25 MG tablet Take 25 mg by mouth every 6 (six) hours as needed. For allergies    . fentaNYL (DURAGESIC - DOSED MCG/HR) 12 MCG/HR Place 1 patch (12.5 mcg total) onto the skin every 3 (three) days. Fill on or after the 30th 10 patch 0  . metroNIDAZOLE (METROGEL) 0.75 % gel Apply 1 application topically 2 (two) times daily. 45 g 3  . mirtazapine (REMERON) 30 MG tablet Take 1 tablet (30 mg total) by mouth at bedtime. 90 tablet 1  . montelukast (SINGULAIR) 10 MG tablet Take 1 tablet (10 mg total) by mouth at bedtime. 90 tablet 3  . mupirocin ointment (BACTROBAN) 2 % Apply to affected area TID for 7 days. 30 g 3  . omeprazole (PRILOSEC) 20 MG capsule TAKE 1 CAPSULE BY MOUTH ONCE DAILY 90 capsule 1  . ondansetron (ZOFRAN) 4 MG tablet Take 1 tablet (4 mg total) by mouth every 8 (eight) hours as needed for nausea or vomiting. 20 tablet 12  . Oxycodone HCl 10 MG TABS Take 10mg  po 2-3x per day as needed for pain 70  tablet 0  . polyethylene glycol (MIRALAX / GLYCOLAX) packet Take 17 g by mouth daily. 14 each 0  . simvastatin (ZOCOR) 10 MG tablet TAKE 1 TABLET BY MOUTH ONCE DAILY 90 tablet 0  . triamterene-hydrochlorothiazide (MAXZIDE-25) 37.5-25 MG tablet Take 1 tablet by mouth daily. 90 tablet 3  . vortioxetine HBr (TRINTELLIX) 5 MG TABS Take 1 tablet (5 mg total) by mouth daily. 90 tablet 12  . zoledronic acid (RECLAST) 5 MG/100ML SOLN Inject 5 mg into the vein. Once yearly. In June     No current facility-administered medications for this visit.    Allergies  Allergen Reactions  . Bee Venom Anaphylaxis    Has epi-pen  . Ceftin [Cefuroxime Axetil] Anaphylaxis  . Ciprofloxacin Anaphylaxis and Palpitations  . Ephedrine Other (See  Comments) and Palpitations    "knocks me out"  . Sulfonamide Derivatives Anaphylaxis  . Telithromycin Anaphylaxis    Reaction: also blurred vision   . Valsartan Anaphylaxis  . Verapamil Anaphylaxis  . Venlafaxine Other (See Comments)    Insomnia, panic  . Beta Adrenergic Blockers     bradycardia  . Cephalosporins     Allergic Reaction  . Clindamycin/Lincomycin     Dry skin and itching/ Cdiff  . Cymbalta [Duloxetine Hcl] Other (See Comments)    Reaction:Confusion and " I fall down"  . Doxazosin     "blurred vision and irritable"  . Gabapentin Other (See Comments)    Reaction: headache  . Hydralazine     HA, Diarrhea  . Lamotrigine     Patient unsure at this time  . Morphine Other (See Comments)    Medication has no effect with pain  . Oxycodone-Acetaminophen Hives    Takes plain oxy IR at home (May 2018)  . Pregabalin Swelling  . Prochlorperazine Edisylate   . Pseudoephedrine Other (See Comments)    Reaction: hyperventilates and blacks out  . Relafen [Nabumetone] Swelling  . Zoloft [Sertraline] Other (See Comments)    "nervous/jittery"  . Doxycycline Hives    Health Maintenance Health Maintenance  Topic Date Due  . TETANUS/TDAP  02/20/2020  .  INFLUENZA VACCINE  Completed  . DEXA SCAN  Completed  . PNA vac Low Risk Adult  Completed     Exam:  BP 120/75   Pulse 96   Ht 5' (1.524 m)   Wt 147 lb (66.7 kg)   BMI 28.71 kg/m  Gen: Well NAD HEENT: EOMI,  MMM Lungs: Normal work of breathing. CTABL Heart: RRR no MRG Abd: NABS, Soft. Nondistended, Nontender Exts: Brisk capillary refill, warm and well perfused.   X-ray left wrist reveals well-appearing distal radius plate with intact surgical hardware and screws.  However the wrist joint itself shows considerable degenerative changes with no obvious new fractures.  Awaiting formal radiology review.    Chemistry      Component Value Date/Time   NA 138 09/11/2017 0812   NA 141 10/02/2013   K 4.4 09/11/2017 0812   CL 98 09/11/2017 0812   CO2 30 09/11/2017 0812   BUN 17 09/11/2017 0812   BUN 10 10/02/2013   CREATININE 1.27 (H) 09/11/2017 0812   GLU 104 10/02/2013      Component Value Date/Time   CALCIUM 9.9 09/11/2017 0812   CALCIUM 9.8 10/02/2013   ALKPHOS 84 01/25/2016 1447   AST 22 09/11/2017 0812   ALT 14 09/11/2017 0812   BILITOT 0.6 09/11/2017 0812        Assessment and Plan: 84 y.o. female with  Anxiety and depression doing great relative to Rashonda has historical control over the last year.  Continue current regimen and recheck in 1 month.  Wrist pain: Suspect posttraumatic degenerative changes.  The surgical hardware appears to be in place.  At this point the next step would be considering steroid injections which we have been avoiding to allow great healing of the fractures and surgical hardware.  Recheck in a month.  Disease pain doing better continue home exercises.  Chronic pain: Stable.  Continue current regimen refill medications as needed.  Hypertension: Well controlled.  Labs reviewed above.  Creatinine is a bit elevated compared to baseline.  We will recheck this in a month.   No orders of the defined types were placed in this encounter.  No  orders  of the defined types were placed in this encounter.    Discussed warning signs or symptoms. Please see discharge instructions. Patient expresses understanding.

## 2017-09-18 DIAGNOSIS — J45909 Unspecified asthma, uncomplicated: Secondary | ICD-10-CM | POA: Diagnosis not present

## 2017-09-18 DIAGNOSIS — M62838 Other muscle spasm: Secondary | ICD-10-CM | POA: Diagnosis not present

## 2017-09-18 DIAGNOSIS — N183 Chronic kidney disease, stage 3 (moderate): Secondary | ICD-10-CM | POA: Diagnosis not present

## 2017-09-18 DIAGNOSIS — M5134 Other intervertebral disc degeneration, thoracic region: Secondary | ICD-10-CM | POA: Diagnosis not present

## 2017-09-18 DIAGNOSIS — I129 Hypertensive chronic kidney disease with stage 1 through stage 4 chronic kidney disease, or unspecified chronic kidney disease: Secondary | ICD-10-CM | POA: Diagnosis not present

## 2017-09-18 DIAGNOSIS — M5030 Other cervical disc degeneration, unspecified cervical region: Secondary | ICD-10-CM | POA: Diagnosis not present

## 2017-09-20 DIAGNOSIS — N183 Chronic kidney disease, stage 3 (moderate): Secondary | ICD-10-CM | POA: Diagnosis not present

## 2017-09-20 DIAGNOSIS — M5030 Other cervical disc degeneration, unspecified cervical region: Secondary | ICD-10-CM | POA: Diagnosis not present

## 2017-09-20 DIAGNOSIS — M5134 Other intervertebral disc degeneration, thoracic region: Secondary | ICD-10-CM | POA: Diagnosis not present

## 2017-09-20 DIAGNOSIS — I129 Hypertensive chronic kidney disease with stage 1 through stage 4 chronic kidney disease, or unspecified chronic kidney disease: Secondary | ICD-10-CM | POA: Diagnosis not present

## 2017-09-20 DIAGNOSIS — J45909 Unspecified asthma, uncomplicated: Secondary | ICD-10-CM | POA: Diagnosis not present

## 2017-09-20 DIAGNOSIS — M62838 Other muscle spasm: Secondary | ICD-10-CM | POA: Diagnosis not present

## 2017-09-26 ENCOUNTER — Other Ambulatory Visit: Payer: Self-pay

## 2017-09-26 MED ORDER — OXYCODONE HCL 10 MG PO TABS: ORAL_TABLET | ORAL | 0 refills | Status: DC

## 2017-09-26 MED ORDER — FENTANYL 12 MCG/HR TD PT72: 12.5000 ug | MEDICATED_PATCH | TRANSDERMAL | 0 refills | Status: DC

## 2017-09-26 NOTE — Telephone Encounter (Signed)
[facility] called for a refill.

## 2017-09-28 ENCOUNTER — Telehealth: Payer: Self-pay | Admitting: Family Medicine

## 2017-09-28 NOTE — Telephone Encounter (Signed)
Pt called and stated that the company that was making her fentynal patches are no longer manufacturning them and with the change in companies she stated the drug has went from a tier 3 to a tier 4. She stated that she would need you to send in a tier exception form for her so she can get the patches at the tier 3. Thanks

## 2017-09-28 NOTE — Telephone Encounter (Signed)
Called and spoke to patient to have her insurance company fax form. Thanks

## 2017-09-28 NOTE — Telephone Encounter (Signed)
Ask the insurance to fax the form to my office.

## 2017-10-10 ENCOUNTER — Ambulatory Visit (INDEPENDENT_AMBULATORY_CARE_PROVIDER_SITE_OTHER): Payer: Medicare Other | Admitting: Family Medicine

## 2017-10-10 ENCOUNTER — Encounter: Payer: Self-pay | Admitting: Family Medicine

## 2017-10-10 VITALS — BP 124/76 | HR 92 | Ht 60.0 in | Wt 151.0 lb

## 2017-10-10 DIAGNOSIS — M25532 Pain in left wrist: Secondary | ICD-10-CM

## 2017-10-10 DIAGNOSIS — N183 Chronic kidney disease, stage 3 unspecified: Secondary | ICD-10-CM

## 2017-10-10 DIAGNOSIS — M153 Secondary multiple arthritis: Secondary | ICD-10-CM | POA: Diagnosis not present

## 2017-10-10 DIAGNOSIS — M069 Rheumatoid arthritis, unspecified: Secondary | ICD-10-CM

## 2017-10-10 DIAGNOSIS — F329 Major depressive disorder, single episode, unspecified: Secondary | ICD-10-CM | POA: Diagnosis not present

## 2017-10-10 DIAGNOSIS — F32A Depression, unspecified: Secondary | ICD-10-CM

## 2017-10-10 DIAGNOSIS — J302 Other seasonal allergic rhinitis: Secondary | ICD-10-CM

## 2017-10-10 DIAGNOSIS — G894 Chronic pain syndrome: Secondary | ICD-10-CM | POA: Diagnosis not present

## 2017-10-10 DIAGNOSIS — F419 Anxiety disorder, unspecified: Secondary | ICD-10-CM | POA: Diagnosis not present

## 2017-10-10 MED ORDER — CETIRIZINE HCL 10 MG PO TABS
10.0000 mg | ORAL_TABLET | Freq: Every day | ORAL | 11 refills | Status: DC
Start: 2017-10-10 — End: 2019-06-04

## 2017-10-10 NOTE — Patient Instructions (Addendum)
Thank you for coming in today.  Continue the splint.  STOP Fentanyl.  Continue oxycodone.  For pain.    Continue trintellix for anxiety.   Reassess in 1 month.   Get labs now.   Start zyrtec.

## 2017-10-10 NOTE — Progress Notes (Signed)
[name] is a 84 y.o. female who presents to [facility]: [place] today for follow up anxiety, wrist pain, CKD, chronic pain, nasal congestion.   Anxiety: [name] has had lifelong anxiety and depression. She recently was reasonably well controlled with Xanax, Remeron, Trintellix. However [name] notes that her anxiety has worsened. She notes that her youngest son is significantly ill and worsening following sounds like a septic gallbladder episode requiring open laparotomy. Additionally during his anesthesia course he seemed to have developed what sounds like a cervical spinal injury and has worsening weakness and may become quadriplegic. She is obviously distressed and finds this to be very anxiety provoking. She has trouble sleeping at times. She has tried counseling in the past and not found to be very helpful. She notes her anxiety symptoms can be severe at times.    Wrist Pain: [name] notes continued left wrist pain. About a year ago she fell and developed a significant fracture to her left wrist requiring ORIF. She's had continued pain in the wrist despite good hardware placement and excellence rehabilitation. She has found that using a soft wrist brace during the day reduces her pain and allow her to function better. Additionally she continues to use diclofenac gel.  Chronic kidney disease:  [name] had recheck labs last month and her creatinine had increased slightly. She denies any leg swelling or shortness of breath..  Chronic Pain: [name] has chronic pain due to multilevel spinal DDD. She had been taking no patches 12.5, oxycodone 10 mg 2-3 per day. She notes the brain if her ferritin no patches changed recently and she notes makes her feel very fatigued and sometimes a throat swelling sensation. She has discontinued the patches and does not wish to restart them. She would  like to minimize her opiates if possible.  Nasal congestion: [name] notes months to years of left-sided nasal congestion and discharge. She notes that she has trouble using nasal sprays. She is not currently taking any anti-histamines. She has had previous sinus surgery. She denies any pain  Past Medical History:  Diagnosis Date  . Anxiety   . Arthritis    osteoarthritis  . Asthma   . C. difficile diarrhea   . Candida esophagitis ([place])   . Chronic sinusitis   . Depression   . Depression   . Diverticulosis   . DJD (degenerative joint disease)     L5 compression fracture  . Edema extremities   . Fibromyalgia   . GERD (gastroesophageal reflux disease)   . Gout   . Hyperlipidemia   . Hypertension   . Osteoarthritis   . Osteopenia   . Panic disorder   . Renal failure, unspecified   . Spinal stenosis of lumbar region   . Tubular adenoma of colon   . Wrist fracture, left    Past Surgical History:  Procedure Laterality Date  . ADENOIDECTOMY    . CARPAL TUNNEL RELEASE  2007, 2009   Bilateral  . CATARACT EXTRACTION, BILATERAL    . CHOANAL ADENIODECTOMY    . DIAGNOSTIC LARYNGOSCOPY N/A 11/05/2015   Procedure: DIAGNOSTIC LARYNGOSCOPY AND ESOPHAGOSCOPY;  Surgeon: Rozetta Nunnery, MD;  Location: [facility];  Service: ENT;  Laterality: N/A;  . ganglion cyct removal on fingers     right  . ganglion cyst  wrist    . KNEE ARTHROSCOPY     right  . NASAL SINUS SURGERY    . OPEN REDUCTION INTERNAL  FIXATION (ORIF) DISTAL RADIAL FRACTURE Left 12/18/2016   Procedure: OPEN REDUCTION INTERNAL FIXATION (ORIF) DISTAL RADIAL FRACTURE;  Surgeon: Iran Planas, MD;  Location: [facility];  Service: Orthopedics;  Laterality: Left;  . SHOULDER SURGERY  2004, 07/30/10, 01/2011   after her right humeral neck fracture, revision hemiarthroplasty 2004 for persistent pain, revision reverse arthroplasty December 2011 for persistent pain, incision and drainage with poly-exchange 01/26/2011 for possible prosthetic  joint infection.  . TONSILLECTOMY AND ADENOIDECTOMY    . TOTAL ABDOMINAL HYSTERECTOMY    . TOTAL HIP ARTHROPLASTY  2004   right  . TOTAL KNEE ARTHROPLASTY  2006   right  . TOTAL SHOULDER REPLACEMENT  2002   right   Social History   Tobacco Use  . Smoking status: Never Smoker  . Smokeless tobacco: Never Used  Substance Use Topics  . Alcohol use: No   family history includes Bladder Cancer in her father; Breast cancer in her unknown relative; Colon polyps in her unknown relative; Depression in her son; Diabetes in her mother; Hypertension in her mother.  ROS as above:  Medications: Current Outpatient Medications  Medication Sig Dispense Refill  . albuterol (PROVENTIL HFA;VENTOLIN HFA) 108 (90 Base) MCG/ACT inhaler Inhale 2 puffs into the lungs every 6 (six) hours as needed for wheezing or shortness of breath. 1 Inhaler 0  . ALPRAZolam (XANAX) 0.5 MG tablet Take 1 pill 3x daily for anxeity as needed. May take a 4th pill occasionally as needed. 100 tablet 3  . AMBULATORY NON FORMULARY MEDICATION Rolling [name].  Dx: Rheumatoid Arthritis.  Use daily as needed for ambulation. 1 Units 0  . amLODipine (NORVASC) 10 MG tablet TAKE 1 TABLET BY MOUTH ONCE DAILY 90 tablet 0  . Calcium Carbonate-Vitamin D (CALCIUM 600+D) 600-400 MG-UNIT per tablet Take 1 tablet by mouth 2 (two) times daily.    . diclofenac sodium (VOLTAREN) 1 % GEL Apply 4 g topically 4 (four) times daily. To affected joint. 500 g 99  . diphenhydrAMINE (BENADRYL) 25 MG tablet Take 25 mg by mouth every 6 (six) hours as needed. For allergies    . metroNIDAZOLE (METROGEL) 0.75 % gel Apply 1 application topically 2 (two) times daily. 45 g 3  . mirtazapine (REMERON) 30 MG tablet Take 1 tablet (30 mg total) by mouth at bedtime. 90 tablet 1  . montelukast (SINGULAIR) 10 MG tablet Take 1 tablet (10 mg total) by mouth at bedtime. 90 tablet 3  . mupirocin ointment (BACTROBAN) 2 % Apply to affected area TID for 7 days. 30 g 3  .  omeprazole (PRILOSEC) 20 MG capsule TAKE 1 CAPSULE BY MOUTH ONCE DAILY 90 capsule 1  . ondansetron (ZOFRAN) 4 MG tablet Take 1 tablet (4 mg total) by mouth every 8 (eight) hours as needed for nausea or vomiting. 20 tablet 12  . Oxycodone HCl 10 MG TABS Take 10mg  po 2-3x per day as needed for pain 70 tablet 0  . polyethylene glycol (MIRALAX / GLYCOLAX) packet Take 17 g by mouth daily. 14 each 0  . simvastatin (ZOCOR) 10 MG tablet TAKE 1 TABLET BY MOUTH ONCE DAILY 90 tablet 0  . triamterene-hydrochlorothiazide (MAXZIDE-25) 37.5-25 MG tablet Take 1 tablet by mouth daily. 90 tablet 3  . vortioxetine HBr (TRINTELLIX) 5 MG TABS Take 1 tablet (5 mg total) by mouth daily. 90 tablet 12  . zoledronic acid (RECLAST) 5 MG/100ML SOLN Inject 5 mg into the vein. Once yearly. In June    . cetirizine (ZYRTEC) 10 MG tablet Take  1 tablet (10 mg total) by mouth daily. 30 tablet 11   No current facility-administered medications for this visit.    Allergies  Allergen Reactions  . Bee Venom Anaphylaxis    Has epi-pen  . Ceftin [Cefuroxime Axetil] Anaphylaxis  . Ciprofloxacin Anaphylaxis and Palpitations  . Ephedrine Other (See Comments) and Palpitations    "knocks me out"  . Sulfonamide Derivatives Anaphylaxis  . Telithromycin Anaphylaxis    Reaction: also blurred vision   . Valsartan Anaphylaxis  . Verapamil Anaphylaxis  . Venlafaxine Other (See Comments)    Insomnia, panic  . Beta Adrenergic Blockers     bradycardia  . Cephalosporins     Allergic Reaction  . Clindamycin/Lincomycin     Dry skin and itching/ Cdiff  . Cymbalta [Duloxetine Hcl] Other (See Comments)    Reaction:Confusion and " I fall down"  . Doxazosin     "blurred vision and irritable"  . Gabapentin Other (See Comments)    Reaction: headache  . Hydralazine     HA, Diarrhea  . Lamotrigine     Patient unsure at this time  . Morphine Other (See Comments)    Medication has no effect with pain  . Oxycodone-Acetaminophen Hives     Takes plain oxy IR at home (May 2018)  . Pregabalin Swelling  . Prochlorperazine Edisylate   . Pseudoephedrine Other (See Comments)    Reaction: hyperventilates and blacks out  . Relafen [Nabumetone] Swelling  . Zoloft [Sertraline] Other (See Comments)    "nervous/jittery"  . Doxycycline Hives    Health Maintenance Health Maintenance  Topic Date Due  . TETANUS/TDAP  02/20/2020  . INFLUENZA VACCINE  Completed  . DEXA SCAN  Completed  . PNA vac Low Risk Adult  Completed     Exam:  BP 124/76   Pulse 92   Ht 5' (1.524 m)   Wt 151 lb (68.5 kg)   BMI 29.49 kg/m  Gen: Well NAD HEENT: EOMI,  MMM inflamed nasal turbinates bilaterally left worse than right. Nontender maxillary sinuses Lungs: Normal work of breathing. CTABL Heart: RRR no MRG Abd: NABS, Soft. Nondistended, Nontender Exts: Brisk capillary refill, warm and well perfused.  Left wrist: Well-appearing surgical scar. Mildly tender normal motion. Spine: Thoracic kyphosis and cervical lordosis. Decrease neck motion. Patient ambulates with a [name].   FINDINGS: There has been interval sideplate and screw fixation of distal radial fracture with normal alignment and healing at the fracture site.  Posttraumatic deformity of the ulnar metaphysis. There is a complete effacement of the radiocarpal and distal radioulnar joints with subchondral sclerosis and remodeling.  Prominent osteoarthritic changes at the first carpometacarpal joint and intercarpal joints.  IMPRESSION: Status post sideplate and screw fixation of distal radial fracture with normal alignment and healing.  Posttraumatic deformity from healed ulnar metaphysis fracture.  Secondary osteoarthritic changes at the radiocarpal and distal radioulnar joints.   Electronically Signed   By: Fidela Salisbury M.D.   On: 09/15/2017 13:33 I personally (independently) visualized and performed the interpretation of the images attached in this note.      Chemistry      Component Value Date/Time   NA 138 09/11/2017 0812   NA 141 10/02/2013   K 4.4 09/11/2017 0812   CL 98 09/11/2017 0812   CO2 30 09/11/2017 0812   BUN 17 09/11/2017 0812   BUN 10 10/02/2013   CREATININE 1.27 (H) 09/11/2017 0812   GLU 104 10/02/2013      Component Value Date/Time  CALCIUM 9.9 09/11/2017 0812   CALCIUM 9.8 10/02/2013   ALKPHOS 84 01/25/2016 1447   AST 22 09/11/2017 0812   ALT 14 09/11/2017 0812   BILITOT 0.6 09/11/2017 0812        Assessment and Plan: 84 y.o. female with  Anxiety: Worsening due to scary family health issue. We had a discussion. I think therapy my help but [name] is resistant. I also offered to increase Trintellix but [name] elected to hold on that for now. No change in medical plan at this time. Plan to recheck in 1 month.   Chronic Pain: D/C Fentanyl patch which I agree 100% with. Plan to continue Oxycodone and Reassess pain in 1 month.   Wrist Pain: I think the compressive wrist brace is a reasonable pragmatic approach. Continue diclofenac gel. Recheck as needed.   Nasal congestion. Not currently acute sinusitis. Possibly allergies. Add Zyrtec and continue Singulair. Recheck PRN.   CKD: Recheck CMP.   Follow up in 1 month.    Orders Placed This Encounter  Procedures  . COMPLETE METABOLIC PANEL WITH GFR   Meds ordered this encounter  Medications  . cetirizine (ZYRTEC) 10 MG tablet    Sig: Take 1 tablet (10 mg total) by mouth daily.    Dispense:  30 tablet    Refill:  11     Discussed warning signs or symptoms. Please see discharge instructions. Patient expresses understanding.

## 2017-10-11 LAB — COMPLETE METABOLIC PANEL WITH GFR
AG Ratio: 1.6 (calc) (ref 1.0–2.5)
ALKALINE PHOSPHATASE (APISO): 89 U/L (ref 33–130)
ALT: 15 U/L (ref 6–29)
AST: 24 U/L (ref 10–35)
Albumin: 4.4 g/dL (ref 3.6–5.1)
BUN/Creatinine Ratio: 13 (calc) (ref 6–22)
BUN: 16 mg/dL (ref 7–25)
CALCIUM: 10.1 mg/dL (ref 8.6–10.4)
CO2: 31 mmol/L (ref 20–32)
Chloride: 95 mmol/L — ABNORMAL LOW (ref 98–110)
Creat: 1.19 mg/dL — ABNORMAL HIGH (ref 0.60–0.88)
GFR, Est African American: 49 mL/min/{1.73_m2} — ABNORMAL LOW (ref >=60)
GFR, Est Non African American: 42 mL/min/{1.73_m2} — ABNORMAL LOW (ref >=60)
GLOBULIN: 2.8 g/dL (ref 1.9–3.7)
GLUCOSE: 104 mg/dL — AB (ref 65–99)
Potassium: 4.3 mmol/L (ref 3.5–5.3)
SODIUM: 137 mmol/L (ref 135–146)
Total Bilirubin: 0.6 mg/dL (ref 0.2–1.2)
Total Protein: 7.2 g/dL (ref 6.1–8.1)

## 2017-10-12 ENCOUNTER — Ambulatory Visit: Payer: Self-pay | Admitting: Family Medicine

## 2017-10-17 ENCOUNTER — Telehealth: Payer: Self-pay | Admitting: Family Medicine

## 2017-10-17 NOTE — Telephone Encounter (Signed)
Pt called and states that Dr.Corey told her she could call him about her Trintellix and she is requesting that he call her back as soon as he gets a chance [phone]

## 2017-10-18 MED ORDER — VORTIOXETINE HBR 10 MG PO TABS: 10.0000 mg | ORAL_TABLET | Freq: Every day | ORAL | 12 refills | Status: DC

## 2017-10-18 NOTE — Telephone Encounter (Signed)
Shamekia noted that her anxiety and depression is not well controlled.  Plan to increase the Trintellix from 5mg  to 10mg . New Rx sent.  Pt requests 30 day supply due to drug costs.

## 2017-10-21 DIAGNOSIS — R933 Abnormal findings on diagnostic imaging of other parts of digestive tract: Secondary | ICD-10-CM | POA: Diagnosis not present

## 2017-10-21 DIAGNOSIS — Z96611 Presence of right artificial shoulder joint: Secondary | ICD-10-CM | POA: Diagnosis not present

## 2017-10-21 DIAGNOSIS — I444 Left anterior fascicular block: Secondary | ICD-10-CM | POA: Diagnosis not present

## 2017-10-21 DIAGNOSIS — Z91030 Bee allergy status: Secondary | ICD-10-CM | POA: Diagnosis not present

## 2017-10-21 DIAGNOSIS — Z79899 Other long term (current) drug therapy: Secondary | ICD-10-CM | POA: Diagnosis not present

## 2017-10-21 DIAGNOSIS — R509 Fever, unspecified: Secondary | ICD-10-CM | POA: Diagnosis not present

## 2017-10-21 DIAGNOSIS — Z888 Allergy status to other drugs, medicaments and biological substances status: Secondary | ICD-10-CM | POA: Diagnosis not present

## 2017-10-21 DIAGNOSIS — K5730 Diverticulosis of large intestine without perforation or abscess without bleeding: Secondary | ICD-10-CM | POA: Diagnosis not present

## 2017-10-21 DIAGNOSIS — Z96651 Presence of right artificial knee joint: Secondary | ICD-10-CM | POA: Diagnosis not present

## 2017-10-21 DIAGNOSIS — Z883 Allergy status to other anti-infective agents status: Secondary | ICD-10-CM | POA: Diagnosis not present

## 2017-10-21 DIAGNOSIS — K7689 Other specified diseases of liver: Secondary | ICD-10-CM | POA: Diagnosis not present

## 2017-10-21 DIAGNOSIS — Z96641 Presence of right artificial hip joint: Secondary | ICD-10-CM | POA: Diagnosis not present

## 2017-10-21 DIAGNOSIS — R112 Nausea with vomiting, unspecified: Secondary | ICD-10-CM | POA: Diagnosis not present

## 2017-10-21 DIAGNOSIS — Z882 Allergy status to sulfonamides status: Secondary | ICD-10-CM | POA: Diagnosis not present

## 2017-10-21 DIAGNOSIS — K529 Noninfective gastroenteritis and colitis, unspecified: Secondary | ICD-10-CM | POA: Diagnosis not present

## 2017-10-21 DIAGNOSIS — Z7951 Long term (current) use of inhaled steroids: Secondary | ICD-10-CM | POA: Diagnosis not present

## 2017-10-21 DIAGNOSIS — K8020 Calculus of gallbladder without cholecystitis without obstruction: Secondary | ICD-10-CM | POA: Diagnosis not present

## 2017-10-21 DIAGNOSIS — R109 Unspecified abdominal pain: Secondary | ICD-10-CM | POA: Diagnosis not present

## 2017-10-21 DIAGNOSIS — I10 Essential (primary) hypertension: Secondary | ICD-10-CM | POA: Diagnosis not present

## 2017-10-24 ENCOUNTER — Ambulatory Visit (INDEPENDENT_AMBULATORY_CARE_PROVIDER_SITE_OTHER): Payer: Medicare Other | Admitting: Family Medicine

## 2017-10-24 VITALS — BP 125/77 | HR 106 | Wt 144.0 lb

## 2017-10-24 DIAGNOSIS — K529 Noninfective gastroenteritis and colitis, unspecified: Secondary | ICD-10-CM

## 2017-10-24 MED ORDER — DIPHENOXYLATE-ATROPINE 2.5-0.025 MG PO TABS
ORAL_TABLET | ORAL | 3 refills | Status: DC
Start: 2017-10-24 — End: 2018-07-23

## 2017-10-24 MED ORDER — PROMETHAZINE HCL 25 MG PO TABS: 25.0000 mg | ORAL_TABLET | Freq: Four times a day (QID) | ORAL | 2 refills | Status: AC | PRN

## 2017-10-24 NOTE — Progress Notes (Signed)
[name] is a 84 y.o. female who presents to [facility]: [place] today for follow-up gastroenteritis.  [name] was seen in the emergency department March 23 for vomiting and diarrhea.  Laboratory assessment and CT scan was essentially unremarkable.  She was thought to have gastroenteritis.  She was treated with Zofran and Phenergan and Bentyl.  She notes that she is feeling a lot better now with no further episodes of vomiting or diarrhea.  She took some leftover Lomotil that also help for diarrhea.  She notes that the Zofran did not help much at all but Phenergan has been very helpful she denies any significant sedation with Phenergan.[name] She was seen for anxiety last week and her Trintellix was increased to 21st but she did not have any GI symptoms on the 21st or 22nd.  She does not think Trintellix is involved.   Past Medical History:  Diagnosis Date  . Anxiety   . Arthritis    osteoarthritis  . Asthma   . C. difficile diarrhea   . Candida esophagitis ([place])   . Chronic sinusitis   . Depression   . Depression   . Diverticulosis   . DJD (degenerative joint disease)     L5 compression fracture  . Edema extremities   . Fibromyalgia   . GERD (gastroesophageal reflux disease)   . Gout   . Hyperlipidemia   . Hypertension   . Osteoarthritis   . Osteopenia   . Panic disorder   . Renal failure, unspecified   . Spinal stenosis of lumbar region   . Tubular adenoma of colon   . Wrist fracture, left    Past Surgical History:  Procedure Laterality Date  . ADENOIDECTOMY    . CARPAL TUNNEL RELEASE  2007, 2009   Bilateral  . CATARACT EXTRACTION, BILATERAL    . CHOANAL ADENIODECTOMY    . DIAGNOSTIC LARYNGOSCOPY N/A 11/05/2015   Procedure: DIAGNOSTIC LARYNGOSCOPY AND ESOPHAGOSCOPY;  Surgeon: Rozetta Nunnery, MD;  Location: [facility];  Service: ENT;  Laterality: N/A;  .  ganglion cyct removal on fingers     right  . ganglion cyst  wrist    . KNEE ARTHROSCOPY     right  . NASAL SINUS SURGERY    . OPEN REDUCTION INTERNAL FIXATION (ORIF) DISTAL RADIAL FRACTURE Left 12/18/2016   Procedure: OPEN REDUCTION INTERNAL FIXATION (ORIF) DISTAL RADIAL FRACTURE;  Surgeon: Iran Planas, MD;  Location: [facility];  Service: Orthopedics;  Laterality: Left;  . SHOULDER SURGERY  2004, 07/30/10, 01/2011   after her right humeral neck fracture, revision hemiarthroplasty 2004 for persistent pain, revision reverse arthroplasty December 2011 for persistent pain, incision and drainage with poly-exchange 01/26/2011 for possible prosthetic joint infection.  . TONSILLECTOMY AND ADENOIDECTOMY    . TOTAL ABDOMINAL HYSTERECTOMY    . TOTAL HIP ARTHROPLASTY  2004   right  . TOTAL KNEE ARTHROPLASTY  2006   right  . TOTAL SHOULDER REPLACEMENT  2002   right   Social History   Tobacco Use  . Smoking status: Never Smoker  . Smokeless tobacco: Never Used  Substance Use Topics  . Alcohol use: No   family history includes Bladder Cancer in her father; Breast cancer in her unknown relative; Colon polyps in her unknown relative; Depression in her son; Diabetes in her mother; Hypertension in her mother.  ROS as above:  Medications: Current Outpatient Medications  Medication Sig Dispense Refill  . albuterol (PROVENTIL HFA;VENTOLIN  HFA) 108 (90 Base) MCG/ACT inhaler Inhale 2 puffs into the lungs every 6 (six) hours as needed for wheezing or shortness of breath. 1 Inhaler 0  . ALPRAZolam (XANAX) 0.5 MG tablet Take 1 pill 3x daily for anxeity as needed. May take a 4th pill occasionally as needed. 100 tablet 3  . AMBULATORY NON FORMULARY MEDICATION Rolling [place].  Dx: Rheumatoid Arthritis.  Use daily as needed for ambulation. 1 Units 0  . amLODipine (NORVASC) 10 MG tablet TAKE 1 TABLET BY MOUTH ONCE DAILY 90 tablet 0  . Calcium Carbonate-Vitamin D (CALCIUM 600+D) 600-400 MG-UNIT per tablet Take 1  tablet by mouth 2 (two) times daily.    . cetirizine (ZYRTEC) 10 MG tablet Take 1 tablet (10 mg total) by mouth daily. 30 tablet 11  . diclofenac sodium (VOLTAREN) 1 % GEL Apply 4 g topically 4 (four) times daily. To affected joint. 500 g 99  . diphenhydrAMINE (BENADRYL) 25 MG tablet Take 25 mg by mouth every 6 (six) hours as needed. For allergies    . metroNIDAZOLE (METROGEL) 0.75 % gel Apply 1 application topically 2 (two) times daily. 45 g 3  . mirtazapine (REMERON) 30 MG tablet Take 1 tablet (30 mg total) by mouth at bedtime. 90 tablet 1  . montelukast (SINGULAIR) 10 MG tablet Take 1 tablet (10 mg total) by mouth at bedtime. 90 tablet 3  . mupirocin ointment (BACTROBAN) 2 % Apply to affected area TID for 7 days. 30 g 3  . omeprazole (PRILOSEC) 20 MG capsule TAKE 1 CAPSULE BY MOUTH ONCE DAILY 90 capsule 1  . ondansetron (ZOFRAN) 4 MG tablet Take 1 tablet (4 mg total) by mouth every 8 (eight) hours as needed for nausea or vomiting. 20 tablet 12  . Oxycodone HCl 10 MG TABS Take 10mg  po 2-3x per day as needed for pain 70 tablet 0  . polyethylene glycol (MIRALAX / GLYCOLAX) packet Take 17 g by mouth daily. 14 each 0  . simvastatin (ZOCOR) 10 MG tablet TAKE 1 TABLET BY MOUTH ONCE DAILY 90 tablet 0  . triamterene-hydrochlorothiazide (MAXZIDE-25) 37.5-25 MG tablet Take 1 tablet by mouth daily. 90 tablet 3  . vortioxetine HBr (TRINTELLIX) 10 MG TABS tablet Take 1 tablet (10 mg total) by mouth daily. 30 tablet 12  . zoledronic acid (RECLAST) 5 MG/100ML SOLN Inject 5 mg into the vein. Once yearly. In June    . diphenoxylate-atropine (LOMOTIL) 2.5-0.025 MG tablet One to 2 tablets by mouth 4 times a day as needed for diarrhea. 30 tablet 3  . promethazine (PHENERGAN) 25 MG tablet Take 1 tablet (25 mg total) by mouth every 6 (six) hours as needed for nausea or vomiting. 30 tablet 2   No current facility-administered medications for this visit.    Allergies  Allergen Reactions  . Bee Venom Anaphylaxis     Has epi-pen  . Ceftin [Cefuroxime Axetil] Anaphylaxis  . Ciprofloxacin Anaphylaxis and Palpitations  . Ephedrine Other (See Comments) and Palpitations    "knocks me out"  . Sulfonamide Derivatives Anaphylaxis  . Telithromycin Anaphylaxis    Reaction: also blurred vision   . Valsartan Anaphylaxis  . Verapamil Anaphylaxis  . Venlafaxine Other (See Comments)    Insomnia, panic  . Beta Adrenergic Blockers     bradycardia  . Cephalosporins     Allergic Reaction  . Clindamycin/Lincomycin     Dry skin and itching/ Cdiff  . Cymbalta [Duloxetine Hcl] Other (See Comments)    Reaction:Confusion and " I fall down"  .  Doxazosin     "blurred vision and irritable"  . Gabapentin Other (See Comments)    Reaction: headache  . Hydralazine     HA, Diarrhea  . Lamotrigine     Patient unsure at this time  . Morphine Other (See Comments)    Medication has no effect with pain  . Oxycodone-Acetaminophen Hives    Takes plain oxy IR at home (May 2018)  . Pregabalin Swelling  . Prochlorperazine Edisylate   . Pseudoephedrine Other (See Comments)    Reaction: hyperventilates and blacks out  . Relafen [Nabumetone] Swelling  . Zoloft [Sertraline] Other (See Comments)    "nervous/jittery"  . Doxycycline Hives    Health Maintenance Health Maintenance  Topic Date Due  . TETANUS/TDAP  02/20/2020  . INFLUENZA VACCINE  Completed  . DEXA SCAN  Completed  . PNA vac Low Risk Adult  Completed     Exam:  BP 125/77   Pulse (!) 106   Wt 144 lb (65.3 kg)   BMI 28.12 kg/m  Gen: Well NAD nontoxic appearing HEENT: EOMI,  MMM Lungs: Normal work of breathing. CTABL Heart: RRR no MRG heart rate 98 bpm per my check Abd: NABS, Soft. Nondistended, Nontender Exts: Brisk capillary refill, warm and well perfused.   CT scan and laboratory assessment from [facility] reviewed  No results found for this or any previous visit (from the past 10 hour(s)). No results found.    Assessment and Plan: 84 y.o. female  with resolving gastroenteritis.  Patient also has a history of IBS and this may be a factor here.  Patient side effect from increased dose of Trintellix may be a factor as well.  Plan for watchful waiting with Phenergan as needed.  Lomotil refilled but cautioned against using it frequently.  Continue dicyclomine as needed. If symptoms do not improve plan for decreasing Trintellix. Recheck in a few weeks.   No orders of the defined types were placed in this encounter.  Meds ordered this encounter  Medications  . diphenoxylate-atropine (LOMOTIL) 2.5-0.025 MG tablet    Sig: One to 2 tablets by mouth 4 times a day as needed for diarrhea.    Dispense:  30 tablet    Refill:  3  . promethazine (PHENERGAN) 25 MG tablet    Sig: Take 1 tablet (25 mg total) by mouth every 6 (six) hours as needed for nausea or vomiting.    Dispense:  30 tablet    Refill:  2     Discussed warning signs or symptoms. Please see discharge instructions. Patient expresses understanding.

## 2017-10-24 NOTE — Patient Instructions (Signed)
Thank you for coming in today. Drink plenty of fluids.  Use phenergan for nausea or vomiting as needed.  Continue bentyl as needed.  Continue trintellix.  If you do not get better we will split the trintellix in 1/2 for 5mg  size.

## 2017-10-31 ENCOUNTER — Other Ambulatory Visit: Payer: Self-pay | Admitting: Family Medicine

## 2017-10-31 ENCOUNTER — Telehealth: Payer: Self-pay | Admitting: Family Medicine

## 2017-10-31 MED ORDER — OXYCODONE HCL 10 MG PO TABS: ORAL_TABLET | ORAL | 0 refills | Status: DC

## 2017-10-31 NOTE — Telephone Encounter (Signed)
Done

## 2017-10-31 NOTE — Telephone Encounter (Signed)
Pt called and stated she needs a refill on her oxycodone and would like it sent to [facility] in [place]. Thanks

## 2017-11-03 ENCOUNTER — Other Ambulatory Visit: Payer: Self-pay

## 2017-11-03 ENCOUNTER — Encounter: Payer: Self-pay | Admitting: Emergency Medicine

## 2017-11-03 ENCOUNTER — Emergency Department (INDEPENDENT_AMBULATORY_CARE_PROVIDER_SITE_OTHER)
Admission: EM | Admit: 2017-11-03 | Discharge: 2017-11-03 | Disposition: A | Discharge status: Home / Self Care | Payer: Medicare Other | Source: Home / Self Care | Attending: Emergency Medicine | Admitting: Emergency Medicine

## 2017-11-03 DIAGNOSIS — R110 Nausea: Secondary | ICD-10-CM

## 2017-11-03 DIAGNOSIS — R197 Diarrhea, unspecified: Secondary | ICD-10-CM

## 2017-11-03 MED ORDER — ONDANSETRON 4 MG PO TBDP
4.0000 mg | ORAL_TABLET | Freq: Once | ORAL | Status: AC
  Administered 2017-11-03: 4 mg via ORAL

## 2017-11-03 MED ORDER — ONDANSETRON 4 MG PO TBDP: 4.0000 mg | ORAL_TABLET | Freq: Three times a day (TID) | ORAL | 0 refills | Status: DC | PRN

## 2017-11-03 NOTE — ED Provider Notes (Signed)
[name] CARE    CSN: [id number] Arrival date & time: 11/03/17  1017     History   Chief Complaint Chief Complaint  Patient presents with  . Nausea  . Emesis  . Diarrhea    HPI [name] is a 84 y.o. female.  Patient seen earlier in the week with gastroenteritis.  She was seen at [facility] and found to have a normal white count with CT showing some fluid-filled small bowel loops. She enters today with onset this morning of nausea.  She vomited once and continues to have a burning sensation in her abdomen.  She is also noted mucus in her stools.  Her abdomen does not feel bloated. HPI  Past Medical History:  Diagnosis Date  . Anxiety   . Arthritis    osteoarthritis  . Asthma   . C. difficile diarrhea   . Candida esophagitis ([place])   . Chronic sinusitis   . Depression   . Depression   . Diverticulosis   . DJD (degenerative joint disease)     L5 compression fracture  . Edema extremities   . Fibromyalgia   . GERD (gastroesophageal reflux disease)   . Gout   . Hyperlipidemia   . Hypertension   . Osteoarthritis   . Osteopenia   . Panic disorder   . Renal failure, unspecified   . Spinal stenosis of lumbar region   . Tubular adenoma of colon   . Wrist fracture, left     Patient Active Problem List   Diagnosis Date Noted  . Plantar wart of left foot 04/20/2017  . Chronic pain 02/15/2017  . Left wrist pain 12/18/2016  . Lumbago 12/08/2016  . Status post total shoulder arthroplasty, right 11/11/2016  . S/P total knee arthroplasty, right 11/11/2016  . Primary osteoarthritis of left hip 07/28/2016  . History of total right hip arthroplasty 06/17/2016  . Prediabetes 02/24/2016  . Chronic kidney disease (CKD), stage III (moderate) ([place]) 02/24/2016  . Weakness 04/20/2015  . Seasonal allergies 02/24/2015  . Primary osteoarthritis of left knee 01/01/2015  . Constipation 05/23/2014  . Osteoporosis 03/12/2014  . Personal history of colonic polyps 03/12/2014   . Chronic rheumatic arthritis ([place]) 02/19/2014  . Anxiety and depression 02/19/2014  . Bradycardia 06/03/2013  . GERD (gastroesophageal reflux disease)   . HTN (hypertension) 03/31/2011  . Dyslipidemia 03/31/2011  . Fibromyalgia 03/31/2011  . DJD (degenerative joint disease) 03/31/2011  . IBD 09/27/2010  . COUGH, CHRONIC 09/24/2010    Past Surgical History:  Procedure Laterality Date  . ADENOIDECTOMY    . CARPAL TUNNEL RELEASE  2007, 2009   Bilateral  . CATARACT EXTRACTION, BILATERAL    . CHOANAL ADENIODECTOMY    . DIAGNOSTIC LARYNGOSCOPY N/A 11/05/2015   Procedure: DIAGNOSTIC LARYNGOSCOPY AND ESOPHAGOSCOPY;  Surgeon: Rozetta Nunnery, MD;  Location: [facility];  Service: ENT;  Laterality: N/A;  . ganglion cyct removal on fingers     right  . ganglion cyst  wrist    . KNEE ARTHROSCOPY     right  . NASAL SINUS SURGERY    . OPEN REDUCTION INTERNAL FIXATION (ORIF) DISTAL RADIAL FRACTURE Left 12/18/2016   Procedure: OPEN REDUCTION INTERNAL FIXATION (ORIF) DISTAL RADIAL FRACTURE;  Surgeon: Iran Planas, MD;  Location: [facility];  Service: Orthopedics;  Laterality: Left;  . SHOULDER SURGERY  2004, 07/30/10, 01/2011   after her right humeral neck fracture, revision hemiarthroplasty 2004 for persistent pain, revision reverse arthroplasty December 2011 for persistent pain, incision and drainage  with poly-exchange 01/26/2011 for possible prosthetic joint infection.  . TONSILLECTOMY AND ADENOIDECTOMY    . TOTAL ABDOMINAL HYSTERECTOMY    . TOTAL HIP ARTHROPLASTY  2004   right  . TOTAL KNEE ARTHROPLASTY  2006   right  . TOTAL SHOULDER REPLACEMENT  2002   right    OB History   None      Home Medications    Prior to Admission medications   Medication Sig Start Date End Date Taking? Authorizing Provider  albuterol (PROVENTIL HFA;VENTOLIN HFA) 108 (90 Base) MCG/ACT inhaler Inhale 2 puffs into the lungs every 6 (six) hours as needed for wheezing or shortness of breath. 08/14/17   Gregor Hams, MD  ALPRAZolam Duanne Moron) 0.5 MG tablet Take 1 pill 3x daily for anxeity as needed. May take a 4th pill occasionally as needed. 08/14/17   Gregor Hams, MD  AMBULATORY NON FORMULARY MEDICATION [place].  Dx: Rheumatoid Arthritis.  Use daily as needed for ambulation. 06/17/15   Hommel, Hilliard Clark, DO  amLODipine (NORVASC) 10 MG tablet TAKE 1 TABLET BY MOUTH ONCE DAILY 09/06/17   Gregor Hams, MD  Calcium Carbonate-Vitamin D (CALCIUM 600+D) 600-400 MG-UNIT per tablet Take 1 tablet by mouth 2 (two) times daily.    [provider]  cetirizine (ZYRTEC) 10 MG tablet Take 1 tablet (10 mg total) by mouth daily. 10/10/17   Gregor Hams, MD  diclofenac sodium (VOLTAREN) 1 % GEL Apply 4 g topically 4 (four) times daily. To affected joint. 08/14/17   Gregor Hams, MD  diphenhydrAMINE (BENADRYL) 25 MG tablet Take 25 mg by mouth every 6 (six) hours as needed. For allergies    [provider]  diphenoxylate-atropine (LOMOTIL) 2.5-0.025 MG tablet One to 2 tablets by mouth 4 times a day as needed for diarrhea. 10/24/17   Gregor Hams, MD  metroNIDAZOLE (METROGEL) 0.75 % gel Apply 1 application topically 2 (two) times daily. 04/10/17   Gregor Hams, MD  mirtazapine (REMERON) 30 MG tablet Take 1 tablet (30 mg total) by mouth at bedtime. 07/18/17   Gregor Hams, MD  montelukast (SINGULAIR) 10 MG tablet Take 1 tablet (10 mg total) by mouth at bedtime. 01/13/17   Gregor Hams, MD  mupirocin ointment (BACTROBAN) 2 % Apply to affected area TID for 7 days. 04/10/17   Gregor Hams, MD  omeprazole (PRILOSEC) 20 MG capsule TAKE 1 CAPSULE BY MOUTH ONCE DAILY 08/30/17   Gregor Hams, MD  ondansetron (ZOFRAN ODT) 4 MG disintegrating tablet Take 1 tablet (4 mg total) by mouth every 8 (eight) hours as needed for nausea or vomiting. 11/03/17   Darlyne Russian, MD  ondansetron (ZOFRAN) 4 MG tablet Take 1 tablet (4 mg total) by mouth every 8 (eight) hours as needed for nausea or vomiting. 01/13/17   Gregor Hams, MD    Oxycodone HCl 10 MG TABS Take 10mg  po 2-3x per day as needed for pain 10/31/17   Gregor Hams, MD  polyethylene glycol (MIRALAX / GLYCOLAX) packet Take 17 g by mouth daily. 12/20/16   Bonnielee Haff, MD  promethazine (PHENERGAN) 25 MG tablet Take 1 tablet (25 mg total) by mouth every 6 (six) hours as needed for nausea or vomiting. 10/24/17   Gregor Hams, MD  simvastatin (ZOCOR) 10 MG tablet TAKE 1 TABLET BY MOUTH ONCE DAILY 10/31/17   Gregor Hams, MD  triamterene-hydrochlorothiazide (MAXZIDE-25) 37.5-25 MG tablet Take 1 tablet by mouth daily. 08/14/17  Gregor Hams, MD  vortioxetine HBr (TRINTELLIX) 10 MG TABS tablet Take 1 tablet (10 mg total) by mouth daily. 10/18/17 01/16/18  Gregor Hams, MD  zoledronic acid (RECLAST) 5 MG/100ML SOLN Inject 5 mg into the vein. Once yearly. In June    [provider]  venlafaxine XR (EFFEXOR XR) 75 MG 24 hr capsule Take 75 mg by mouth daily with breakfast.  04/18/14  [provider]    Family History Family History  Problem Relation Age of Onset  . Bladder Cancer Father   . Diabetes Mother   . Hypertension Mother   . Breast cancer Unknown   . Colon polyps Unknown   . Depression Son     Social History Social History   Tobacco Use  . Smoking status: Never Smoker  . Smokeless tobacco: Never Used  Substance Use Topics  . Alcohol use: No  . Drug use: No     Allergies   Bee venom; Ceftin [cefuroxime axetil]; Ciprofloxacin; Ephedrine; Sulfonamide derivatives; Telithromycin; Valsartan; Verapamil; Venlafaxine; Beta adrenergic blockers; Cephalosporins; Clindamycin/lincomycin; Cymbalta [duloxetine hcl]; Doxazosin; Gabapentin; Hydralazine; Lamotrigine; Morphine; Oxycodone-acetaminophen; Pregabalin; Prochlorperazine edisylate; Pseudoephedrine; Relafen [nabumetone]; Zoloft [sertraline]; and Doxycycline   Review of Systems Review of Systems  Constitutional: Negative.   HENT: Negative.   Eyes: Negative.   Respiratory: Negative for  cough.   Cardiovascular: Negative.   Gastrointestinal: Positive for abdominal pain, nausea and vomiting.  Endocrine: Positive for cold intolerance.  Genitourinary: Negative.      Physical Exam Triage Vital Signs ED Triage Vitals  Enc Vitals Group     BP 11/03/17 1104 128/74     Pulse Rate 11/03/17 1104 (!) 106     Resp 11/03/17 1104 18     Temp 11/03/17 1104 98.4 F (36.9 C)     Temp Source 11/03/17 1104 Oral     SpO2 11/03/17 1104 95 %     Weight 11/03/17 1108 144 lb (65.3 kg)     Height 11/03/17 1108 5' (1.524 m)     Head Circumference --      Peak Flow --      Pain Score 11/03/17 1108 0     Pain Loc --      Pain Edu? --      Excl. in [place]? --    No data found.  Updated Vital Signs BP 128/74 (BP Location: Right Arm)   Pulse (!) 106   Temp 98.4 F (36.9 C) (Oral)   Resp 18   Ht 5' (1.524 m)   Wt 144 lb (65.3 kg)   SpO2 95%   BMI 28.12 kg/m   Visual Acuity Right Eye Distance:   Left Eye Distance:   Bilateral Distance:    Right Eye Near:   Left Eye Near:    Bilateral Near:     Physical Exam  Constitutional:  Patient is alert and cooperative.  HENT:  Head: Normocephalic.  Eyes: Pupils are equal, round, and reactive to light.  Neck: Normal range of motion.  Cardiovascular: Normal rate.  Pulmonary/Chest: Effort normal.  Abdominal:  Abdomen does appear slightly distended.  There are no definite areas of tenderness.  There is no rebound noted.     UC Treatments / Results  Labs (all labs ordered are listed, but only abnormal results are displayed) Labs Reviewed - No data to display  EKG None Radiology No results found.  Procedures Procedures (including critical care time)  Medications Ordered in UC Medications  ondansetron (ZOFRAN-ODT) disintegrating tablet 4 mg (  4 mg Oral Given 11/03/17 1139)     Initial Impression / Assessment and Plan / UC Course  I have reviewed the triage vital signs and the nursing notes.  Pertinent labs & imaging  results that were available during my care of the patient were reviewed by me and considered in my medical decision making (see chart for details). This is a complicated situation.  Patient was tearful off and on.  Her husband is in a care facility.  She is on chronic pain medications and antianxiety drugs.  She was recently seen at no one and underwent CT and chemistry evaluation.  She does have a life alert button.  Dr. Georgina Snell was kind enough to come by and reassure the patient.  She has Xanax to take at home and was given a prescription for Zofran to have for nausea as needed.  She will follow-up with Dr. Georgina Snell on Tuesday.      Final Clinical Impressions(s) / UC Diagnoses   Final diagnoses:  Nausea  Diarrhea, unspecified type    ED Discharge Orders        Ordered    ondansetron (ZOFRAN ODT) 4 MG disintegrating tablet  Every 8 hours PRN     11/03/17 1314       Controlled Substance Prescriptions [place] Controlled Substance Registry consulted? Not Applicable   Darlyne Russian, MD 11/03/17 1436

## 2017-11-03 NOTE — ED Triage Notes (Signed)
Patient was overnight hospitalized 12 days ago for GI problems; since then she has felt better, then worse again; was seen by Dr. Georgina Snell post hospital stay. Here with daughter; uses [facility].

## 2017-11-03 NOTE — ED Notes (Signed)
Patient received only one dose of Zofran 4mg  at 11:38 am.

## 2017-11-08 ENCOUNTER — Ambulatory Visit (INDEPENDENT_AMBULATORY_CARE_PROVIDER_SITE_OTHER): Payer: Medicare Other | Admitting: Family Medicine

## 2017-11-08 ENCOUNTER — Encounter: Payer: Self-pay | Admitting: Family Medicine

## 2017-11-08 VITALS — BP 119/70 | HR 80 | Wt 146.0 lb

## 2017-11-08 DIAGNOSIS — F329 Major depressive disorder, single episode, unspecified: Secondary | ICD-10-CM | POA: Diagnosis not present

## 2017-11-08 DIAGNOSIS — I10 Essential (primary) hypertension: Secondary | ICD-10-CM

## 2017-11-08 DIAGNOSIS — R110 Nausea: Secondary | ICD-10-CM

## 2017-11-08 DIAGNOSIS — F419 Anxiety disorder, unspecified: Secondary | ICD-10-CM | POA: Diagnosis not present

## 2017-11-08 DIAGNOSIS — F32A Depression, unspecified: Secondary | ICD-10-CM

## 2017-11-08 MED ORDER — ONDANSETRON 4 MG PO TBDP: 4.0000 mg | ORAL_TABLET | Freq: Three times a day (TID) | ORAL | 12 refills | Status: DC | PRN

## 2017-11-08 NOTE — Patient Instructions (Signed)
Thank you for coming in today. Recheck in 1-2 months.  Return sooner if needed.

## 2017-11-08 NOTE — Progress Notes (Signed)
[name] is a 84 y.o. female who presents to [facility] today for anxiety and nausea and diarrhea.  [name] has been seen several times for anxiety and depression.  She takes medication listed below and notes that overall her symptoms are better improved now than they had been previously.  She does not episodes of anxiety.  She notes her husband is currently in a nursing home recovering from a leg injury.  Additionally her granddaughter is having seizures which is very bothersome to her.  However right now she think she is feeling pretty well.  Nausea and diarrhea.  [name] notes ongoing intermittent stomach upset associated with occasional nausea and vomiting and occasional diarrhea.  She notes that Zofran has helped quite a bit and would like a refill if possible.   Past Medical History:  Diagnosis Date  . Anxiety   . Arthritis    osteoarthritis  . Asthma   . C. difficile diarrhea   . Candida esophagitis ([name])   . Chronic sinusitis   . Depression   . Depression   . Diverticulosis   . DJD (degenerative joint disease)     L5 compression fracture  . Edema extremities   . Fibromyalgia   . GERD (gastroesophageal reflux disease)   . Gout   . Hyperlipidemia   . Hypertension   . Osteoarthritis   . Osteopenia   . Panic disorder   . Renal failure, unspecified   . Spinal stenosis of lumbar region   . Tubular adenoma of colon   . Wrist fracture, left    Past Surgical History:  Procedure Laterality Date  . ADENOIDECTOMY    . CARPAL TUNNEL RELEASE  2007, 2009   Bilateral  . CATARACT EXTRACTION, BILATERAL    . CHOANAL ADENIODECTOMY    . DIAGNOSTIC LARYNGOSCOPY N/A 11/05/2015   Procedure: DIAGNOSTIC LARYNGOSCOPY AND ESOPHAGOSCOPY;  Surgeon: Rozetta Nunnery, MD;  [facility]: [facility];  Service: ENT;  Laterality: N/A;  . ganglion cyct removal on fingers     right  . ganglion cyst  wrist    . KNEE ARTHROSCOPY     right  . NASAL SINUS SURGERY    . OPEN REDUCTION INTERNAL FIXATION (ORIF) DISTAL RADIAL FRACTURE Left 12/18/2016   Procedure: OPEN REDUCTION INTERNAL FIXATION (ORIF) DISTAL RADIAL FRACTURE;  Surgeon: Iran Planas, MD;  Location: [facility];  Service: Orthopedics;  Laterality: Left;  . SHOULDER SURGERY  2004, 07/30/10, 01/2011   after her right humeral neck fracture, revision hemiarthroplasty 2004 for persistent pain, revision reverse arthroplasty December 2011 for persistent pain, incision and drainage with poly-exchange 01/26/2011 for possible prosthetic joint infection.  . TONSILLECTOMY AND ADENOIDECTOMY    . TOTAL ABDOMINAL HYSTERECTOMY    . TOTAL HIP ARTHROPLASTY  2004   right  . TOTAL KNEE ARTHROPLASTY  2006   right  . TOTAL SHOULDER REPLACEMENT  2002   right   Social History   Tobacco Use  . Smoking status: Never Smoker  . Smokeless tobacco: Never Used  Substance Use Topics  . Alcohol use: No   family history includes Bladder Cancer in her father; Breast cancer in her unknown relative; Colon polyps in her unknown relative; Depression in her son; Diabetes in her mother; Hypertension in her mother.  ROS as above:  Medications: Current Outpatient Medications  Medication Sig Dispense Refill  . albuterol (PROVENTIL HFA;VENTOLIN HFA) 108 (90 Base) MCG/ACT inhaler Inhale 2 puffs into the lungs  every 6 (six) hours as needed for wheezing or shortness of breath. 1 Inhaler 0  . ALPRAZolam (XANAX) 0.5 MG tablet Take 1 pill 3x daily for anxeity as needed. May take a 4th pill occasionally as needed. 100 tablet 3  . AMBULATORY NON FORMULARY MEDICATION [place].  Dx: Rheumatoid Arthritis.  Use daily as needed for ambulation. 1 Units 0  . amLODipine (NORVASC) 10 MG tablet TAKE 1 TABLET BY MOUTH ONCE DAILY 90 tablet 0  . Calcium Carbonate-Vitamin D (CALCIUM 600+D) 600-400 MG-UNIT per tablet Take 1 tablet by mouth 2 (two) times daily.      . cetirizine (ZYRTEC) 10 MG tablet Take 1 tablet (10 mg total) by mouth daily. 30 tablet 11  . diclofenac sodium (VOLTAREN) 1 % GEL Apply 4 g topically 4 (four) times daily. To affected joint. 500 g 99  . diphenhydrAMINE (BENADRYL) 25 MG tablet Take 25 mg by mouth every 6 (six) hours as needed. For allergies    . diphenoxylate-atropine (LOMOTIL) 2.5-0.025 MG tablet One to 2 tablets by mouth 4 times a day as needed for diarrhea. 30 tablet 3  . metroNIDAZOLE (METROGEL) 0.75 % gel Apply 1 application topically 2 (two) times daily. 45 g 3  . mirtazapine (REMERON) 30 MG tablet Take 1 tablet (30 mg total) by mouth at bedtime. 90 tablet 1  . montelukast (SINGULAIR) 10 MG tablet Take 1 tablet (10 mg total) by mouth at bedtime. 90 tablet 3  . mupirocin ointment (BACTROBAN) 2 % Apply to affected area TID for 7 days. 30 g 3  . omeprazole (PRILOSEC) 20 MG capsule TAKE 1 CAPSULE BY MOUTH ONCE DAILY 90 capsule 1  . ondansetron (ZOFRAN ODT) 4 MG disintegrating tablet Take 1 tablet (4 mg total) by mouth every 8 (eight) hours as needed for nausea or vomiting. 20 tablet 12  . Oxycodone HCl 10 MG TABS Take 10mg  po 2-3x per day as needed for pain 70 tablet 0  . polyethylene glycol (MIRALAX / GLYCOLAX) packet Take 17 g by mouth daily. 14 each 0  . promethazine (PHENERGAN) 25 MG tablet Take 1 tablet (25 mg total) by mouth every 6 (six) hours as needed for nausea or vomiting. 30 tablet 2  . simvastatin (ZOCOR) 10 MG tablet TAKE 1 TABLET BY MOUTH ONCE DAILY 90 tablet 0  . triamterene-hydrochlorothiazide (MAXZIDE-25) 37.5-25 MG tablet Take 1 tablet by mouth daily. 90 tablet 3  . vortioxetine HBr (TRINTELLIX) 10 MG TABS tablet Take 1 tablet (10 mg total) by mouth daily. 30 tablet 12  . zoledronic acid (RECLAST) 5 MG/100ML SOLN Inject 5 mg into the vein. Once yearly. In June     No current facility-administered medications for this visit.    Allergies  Allergen Reactions  . Bee Venom Anaphylaxis    Has epi-pen  .  Ceftin [Cefuroxime Axetil] Anaphylaxis  . Ciprofloxacin Anaphylaxis and Palpitations  . Ephedrine Other (See Comments) and Palpitations    "knocks me out"  . Sulfonamide Derivatives Anaphylaxis  . Telithromycin Anaphylaxis    Reaction: also blurred vision   . Valsartan Anaphylaxis  . Verapamil Anaphylaxis  . Venlafaxine Other (See Comments)    Insomnia, panic  . Beta Adrenergic Blockers     bradycardia  . Cephalosporins     Allergic Reaction  . Clindamycin/Lincomycin     Dry skin and itching/ Cdiff  . Cymbalta [Duloxetine Hcl] Other (See Comments)    Reaction:Confusion and " I fall down"  . Doxazosin     "blurred vision  and irritable"  . Gabapentin Other (See Comments)    Reaction: headache  . Hydralazine     HA, Diarrhea  . Lamotrigine     Patient unsure at this time  . Morphine Other (See Comments)    Medication has no effect with pain  . Oxycodone-Acetaminophen Hives    Takes plain oxy IR at home (May 2018)  . Pregabalin Swelling  . Prochlorperazine Edisylate   . Pseudoephedrine Other (See Comments)    Reaction: hyperventilates and blacks out  . Relafen [Nabumetone] Swelling  . Zoloft [Sertraline] Other (See Comments)    "nervous/jittery"  . Doxycycline Hives    Health Maintenance Health Maintenance  Topic Date Due  . INFLUENZA VACCINE  03/01/2018  . TETANUS/TDAP  02/20/2020  . DEXA SCAN  Completed  . PNA vac Low Risk Adult  Completed     Exam:  BP 119/70   Pulse 80   Wt 146 lb (66.2 kg)   BMI 28.51 kg/m  Gen: Well NAD nontoxic appearing HEENT: EOMI,  MMM Lungs: Normal work of breathing. CTABL Heart: RRR no MRG Abd: NABS, Soft. Nondistended, Nontender Exts: Brisk capillary refill, warm and well perfused.  Psych alert and oriented normal speech thought process and affect.  No SI or HI expressed.   No results found for this or any previous visit (from the past 72 hour(s)). No results found.    Assessment and Plan: 84 y.o. female with  Anxiety  depression: Improving.  Continue current regimen recheck in about a month.  Nausea and diarrhea are improving as well likely was viral gastroenteritis.  Use Zofran as needed.  Recheck in 1 month.     No orders of the defined types were placed in this encounter.  Meds ordered this encounter  Medications  . ondansetron (ZOFRAN ODT) 4 MG disintegrating tablet    Sig: Take 1 tablet (4 mg total) by mouth every 8 (eight) hours as needed for nausea or vomiting.    Dispense:  20 tablet    Refill:  12     Discussed warning signs or symptoms. Please see discharge instructions. Patient expresses understanding.

## 2017-11-09 ENCOUNTER — Ambulatory Visit: Payer: Self-pay | Admitting: Family Medicine

## 2017-11-16 ENCOUNTER — Other Ambulatory Visit: Payer: Self-pay

## 2017-11-16 DIAGNOSIS — L304 Erythema intertrigo: Secondary | ICD-10-CM

## 2017-11-16 MED ORDER — CLOTRIMAZOLE-BETAMETHASONE 1-0.05 % EX CREA: 1.0000 "application " | TOPICAL_CREAM | Freq: Two times a day (BID) | CUTANEOUS | 1 refills | Status: DC

## 2017-11-16 NOTE — Telephone Encounter (Signed)
[name] called and would like a refill on clotrimazole betamethasone. Last prescribed by Dr Dianah Field. She uses it for a rash under her breast. Please advise.

## 2017-11-22 ENCOUNTER — Ambulatory Visit: Payer: Self-pay | Admitting: Family Medicine

## 2017-11-24 ENCOUNTER — Ambulatory Visit (INDEPENDENT_AMBULATORY_CARE_PROVIDER_SITE_OTHER): Payer: Medicare Other | Admitting: Family Medicine

## 2017-11-24 ENCOUNTER — Encounter: Payer: Self-pay | Admitting: Family Medicine

## 2017-11-24 VITALS — BP 134/73 | HR 93 | Wt 147.0 lb

## 2017-11-24 DIAGNOSIS — F329 Major depressive disorder, single episode, unspecified: Secondary | ICD-10-CM

## 2017-11-24 DIAGNOSIS — M25561 Pain in right knee: Secondary | ICD-10-CM | POA: Diagnosis not present

## 2017-11-24 DIAGNOSIS — F419 Anxiety disorder, unspecified: Secondary | ICD-10-CM | POA: Diagnosis not present

## 2017-11-24 MED ORDER — TRINTELLIX 5 MG PO TABS: 5.0000 mg | ORAL_TABLET | Freq: Every day | ORAL | 12 refills | Status: DC

## 2017-11-24 MED ORDER — ALPRAZOLAM 0.5 MG PO TABS: ORAL_TABLET | ORAL | 3 refills | Status: DC

## 2017-11-24 NOTE — Patient Instructions (Signed)
Thank you for coming in today. Do the knee exercises.  Use a 1-3 pound ankle weight.  Let the start extended and move to a flexed position slowly.  Do about 15-30 reps 2x daily.  Apply ice for about 10-15 mins after exercise.  Use diclofenac gel 4x dialy.  Recheck in 1 month

## 2017-11-24 NOTE — Progress Notes (Signed)
[name] is a 84 y.o. female who presents to [place]: [place] today for right knee pain and anxiety.   Right knee pain: [name] notes new onset of right knee pain over the last few weeks.  She denies any injury.  Her surgical history is pertinent for a total knee replacement years ago.  She notes pain is located at the superior patella pole.  She notes the pain is worse when she stands from a seated position.  She continues to use a [employer] to ambulate.  She denies any fevers or chills nausea vomiting or diarrhea.  Anxiety depression: [name] continues to struggle with anxiety and depression however she has had improvement from her baseline with Trintellix.  She cut the dose down from 10 mg to 5 mg as the 10 mg dose caused worsening GI upset.  She notes that she is doing pretty much the same with the 5 mg dose with much less GI symptoms.  She continues to use alprazolam intermittently as well.   Past Medical History:  Diagnosis Date  . Anxiety   . Arthritis    osteoarthritis  . Asthma   . C. difficile diarrhea   . Candida esophagitis ([place])   . Chronic sinusitis   . Depression   . Depression   . Diverticulosis   . DJD (degenerative joint disease)     L5 compression fracture  . Edema extremities   . Fibromyalgia   . GERD (gastroesophageal reflux disease)   . Gout   . Hyperlipidemia   . Hypertension   . Osteoarthritis   . Osteopenia   . Panic disorder   . Renal failure, unspecified   . Spinal stenosis of lumbar region   . Tubular adenoma of colon   . Wrist fracture, left    Past Surgical History:  Procedure Laterality Date  . ADENOIDECTOMY    . CARPAL TUNNEL RELEASE  2007, 2009   Bilateral  . CATARACT EXTRACTION, BILATERAL    . CHOANAL ADENIODECTOMY    . DIAGNOSTIC LARYNGOSCOPY N/A 11/05/2015   Procedure: DIAGNOSTIC LARYNGOSCOPY AND ESOPHAGOSCOPY;   Surgeon: Rozetta Nunnery, MD;  Location: [facility];  Service: ENT;  Laterality: N/A;  . ganglion cyct removal on fingers     right  . ganglion cyst  wrist    . KNEE ARTHROSCOPY     right  . NASAL SINUS SURGERY    . OPEN REDUCTION INTERNAL FIXATION (ORIF) DISTAL RADIAL FRACTURE Left 12/18/2016   Procedure: OPEN REDUCTION INTERNAL FIXATION (ORIF) DISTAL RADIAL FRACTURE;  Surgeon: Iran Planas, MD;  Location: [facility];  Service: Orthopedics;  Laterality: Left;  . SHOULDER SURGERY  2004, 07/30/10, 01/2011   after her right humeral neck fracture, revision hemiarthroplasty 2004 for persistent pain, revision reverse arthroplasty December 2011 for persistent pain, incision and drainage with poly-exchange 01/26/2011 for possible prosthetic joint infection.  . TONSILLECTOMY AND ADENOIDECTOMY    . TOTAL ABDOMINAL HYSTERECTOMY    . TOTAL HIP ARTHROPLASTY  2004   right  . TOTAL KNEE ARTHROPLASTY  2006   right  . TOTAL SHOULDER REPLACEMENT  2002   right   Social History   Tobacco Use  . Smoking status: Never Smoker  . Smokeless tobacco: Never Used  Substance Use Topics  . Alcohol use: No   family history includes Bladder Cancer in her father; Breast cancer in her unknown relative; Colon polyps in her unknown relative; Depression in her son; Diabetes  in her mother; Hypertension in her mother.  ROS as above:  Medications: Current Outpatient Medications  Medication Sig Dispense Refill  . albuterol (PROVENTIL HFA;VENTOLIN HFA) 108 (90 Base) MCG/ACT inhaler Inhale 2 puffs into the lungs every 6 (six) hours as needed for wheezing or shortness of breath. 1 Inhaler 0  . ALPRAZolam (XANAX) 0.5 MG tablet Take 1 pill 3x daily for anxeity as needed. May take a 4th pill occasionally as needed. 100 tablet 3  . AMBULATORY NON FORMULARY MEDICATION [place].  Dx: Rheumatoid Arthritis.  Use daily as needed for ambulation. 1 Units 0  . amLODipine (NORVASC) 10 MG tablet TAKE 1 TABLET BY MOUTH ONCE DAILY 90  tablet 0  . Calcium Carbonate-Vitamin D (CALCIUM 600+D) 600-400 MG-UNIT per tablet Take 1 tablet by mouth 2 (two) times daily.    . cetirizine (ZYRTEC) 10 MG tablet Take 1 tablet (10 mg total) by mouth daily. 30 tablet 11  . clotrimazole-betamethasone (LOTRISONE) cream Apply 1 application topically 2 (two) times daily. 45 g 1  . diclofenac sodium (VOLTAREN) 1 % GEL Apply 4 g topically 4 (four) times daily. To affected joint. 500 g 99  . diphenhydrAMINE (BENADRYL) 25 MG tablet Take 25 mg by mouth every 6 (six) hours as needed. For allergies    . diphenoxylate-atropine (LOMOTIL) 2.5-0.025 MG tablet One to 2 tablets by mouth 4 times a day as needed for diarrhea. 30 tablet 3  . metroNIDAZOLE (METROGEL) 0.75 % gel Apply 1 application topically 2 (two) times daily. 45 g 3  . mirtazapine (REMERON) 30 MG tablet Take 1 tablet (30 mg total) by mouth at bedtime. 90 tablet 1  . montelukast (SINGULAIR) 10 MG tablet Take 1 tablet (10 mg total) by mouth at bedtime. 90 tablet 3  . mupirocin ointment (BACTROBAN) 2 % Apply to affected area TID for 7 days. 30 g 3  . omeprazole (PRILOSEC) 20 MG capsule TAKE 1 CAPSULE BY MOUTH ONCE DAILY 90 capsule 1  . ondansetron (ZOFRAN ODT) 4 MG disintegrating tablet Take 1 tablet (4 mg total) by mouth every 8 (eight) hours as needed for nausea or vomiting. 20 tablet 12  . Oxycodone HCl 10 MG TABS Take 10mg  po 2-3x per day as needed for pain 70 tablet 0  . polyethylene glycol (MIRALAX / GLYCOLAX) packet Take 17 g by mouth daily. 14 each 0  . promethazine (PHENERGAN) 25 MG tablet Take 1 tablet (25 mg total) by mouth every 6 (six) hours as needed for nausea or vomiting. 30 tablet 2  . simvastatin (ZOCOR) 10 MG tablet TAKE 1 TABLET BY MOUTH ONCE DAILY 90 tablet 0  . triamterene-hydrochlorothiazide (MAXZIDE-25) 37.5-25 MG tablet Take 1 tablet by mouth daily. 90 tablet 3  . zoledronic acid (RECLAST) 5 MG/100ML SOLN Inject 5 mg into the vein. Once yearly. In June    . TRINTELLIX 5 MG  TABS tablet Take 1 tablet (5 mg total) by mouth daily. 30 tablet 12   No current facility-administered medications for this visit.    Allergies  Allergen Reactions  . Bee Venom Anaphylaxis    Has epi-pen  . Ceftin [Cefuroxime Axetil] Anaphylaxis  . Ciprofloxacin Anaphylaxis and Palpitations  . Ephedrine Other (See Comments) and Palpitations    "knocks me out"  . Sulfonamide Derivatives Anaphylaxis  . Telithromycin Anaphylaxis    Reaction: also blurred vision   . Valsartan Anaphylaxis  . Verapamil Anaphylaxis  . Venlafaxine Other (See Comments)    Insomnia, panic  . Beta Adrenergic Blockers  bradycardia  . Cephalosporins     Allergic Reaction  . Clindamycin/Lincomycin     Dry skin and itching/ Cdiff  . Cymbalta [Duloxetine Hcl] Other (See Comments)    Reaction:Confusion and " I fall down"  . Doxazosin     "blurred vision and irritable"  . Gabapentin Other (See Comments)    Reaction: headache  . Hydralazine     HA, Diarrhea  . Lamotrigine     Patient unsure at this time  . Morphine Other (See Comments)    Medication has no effect with pain  . Oxycodone-Acetaminophen Hives    Takes plain oxy IR at home (May 2018)  . Pregabalin Swelling  . Prochlorperazine Edisylate   . Pseudoephedrine Other (See Comments)    Reaction: hyperventilates and blacks out  . Relafen [Nabumetone] Swelling  . Zoloft [Sertraline] Other (See Comments)    "nervous/jittery"  . Doxycycline Hives    Health Maintenance Health Maintenance  Topic Date Due  . INFLUENZA VACCINE  03/01/2018  . TETANUS/TDAP  02/20/2020  . DEXA SCAN  Completed  . PNA vac Low Risk Adult  Completed     Exam:  BP 134/73   Pulse 93   Wt 147 lb 0.6 oz (66.7 kg)   BMI 28.72 kg/m  Gen: Well NAD HEENT: EOMI,  MMM Lungs: Normal work of breathing. CTABL Heart: RRR no MRG Abd: NABS, Soft. Nondistended, Nontender Exts: Brisk capillary refill, warm and well perfused.  Right knee mature well-appearing scar along the  midline.  No effusion erythema induration or ecchymosis. Range of motion 0-100 degrees with retropatellar crepitations. Tender to palpation at the insertion of the quadriceps tendon onto the superior portion of the patella. Stable ligamentous exam. Intact extension and flexion strength. Psych: Alert and oriented normal speech thought process and affect.   EXAM: RIGHT KNEE - COMPLETE 4+ VIEW  COMPARISON:  Knee series 6 09/2014  FINDINGS: Sequelae of right total knee arthroplasty which was also present on the 2016 study. Right knee hardware appears stable and intact without evidence of loosening. Dystrophic calcification along the superior pole of the patella has progressed since 10/09/2014 (lateral view). Evidence of a small right knee joint effusion. No acute fracture. Stable bone mineralization elsewhere.  IMPRESSION: 1. Stable radiographic appearance of right total knee arthroplasty hardware since 2016. 2. Progress dystrophic calcification or ossification along the superior pole of the patella since 2016. Evidence of a small right knee joint effusion.   Electronically Signed   By: Genevie Ann M.D.   On: 11/13/2016 14:08 I personally (independently) visualized and performed the interpretation of the images attached in this note.   Assessment and Plan: 84 y.o. female with  Right knee pain is very likely insertional quadriceps tendinitis.  Plan for diclofenac gel and home eccentric exercises.  Recheck in 1 month.  Anxiety depression: At baseline.  Continue current regimen.  Recheck in 1 month.   No orders of the defined types were placed in this encounter.  Meds ordered this encounter  Medications  . TRINTELLIX 5 MG TABS tablet    Sig: Take 1 tablet (5 mg total) by mouth daily.    Dispense:  30 tablet    Refill:  12  . ALPRAZolam (XANAX) 0.5 MG tablet    Sig: Take 1 pill 3x daily for anxeity as needed. May take a 4th pill occasionally as needed.    Dispense:  100  tablet    Refill:  3     Discussed warning signs or  symptoms. Please see discharge instructions. Patient expresses understanding.

## 2017-12-05 ENCOUNTER — Other Ambulatory Visit: Payer: Self-pay | Admitting: Family Medicine

## 2017-12-06 ENCOUNTER — Other Ambulatory Visit: Payer: Self-pay

## 2017-12-06 MED ORDER — OXYCODONE HCL 10 MG PO TABS: ORAL_TABLET | ORAL | 0 refills | Status: DC

## 2017-12-18 ENCOUNTER — Encounter: Payer: Self-pay | Admitting: Family Medicine

## 2017-12-18 ENCOUNTER — Ambulatory Visit (INDEPENDENT_AMBULATORY_CARE_PROVIDER_SITE_OTHER): Payer: Medicare Other | Admitting: Family Medicine

## 2017-12-18 ENCOUNTER — Other Ambulatory Visit: Payer: Self-pay

## 2017-12-18 VITALS — BP 145/81 | HR 102 | Wt 144.0 lb

## 2017-12-18 DIAGNOSIS — F419 Anxiety disorder, unspecified: Secondary | ICD-10-CM

## 2017-12-18 DIAGNOSIS — F329 Major depressive disorder, single episode, unspecified: Secondary | ICD-10-CM

## 2017-12-18 DIAGNOSIS — F32A Depression, unspecified: Secondary | ICD-10-CM

## 2017-12-18 DIAGNOSIS — G894 Chronic pain syndrome: Secondary | ICD-10-CM

## 2017-12-18 DIAGNOSIS — F4321 Adjustment disorder with depressed mood: Secondary | ICD-10-CM | POA: Diagnosis not present

## 2017-12-18 DIAGNOSIS — N183 Chronic kidney disease, stage 3 unspecified: Secondary | ICD-10-CM

## 2017-12-18 NOTE — Patient Instructions (Addendum)
Thank you for coming in today. Recheck monthly.  Return sooner if needed.   You should hear from [facility] about transport.

## 2017-12-18 NOTE — Progress Notes (Signed)
[name] is a 84 y.o. female who presents to [place]: [name] today for right knee pain and anxiety.    Anxiety depression: Anavictoria continues to struggle with anxiety and depression however she has had improvement from her baseline with Trintellix.   Recently however her dog died which has devastated her. She notes worsening mood and crying. She has continued to use alprazolam intermittently as well.  Chronic back pain: Ongoing for years now currently reasonably managed with chronic opiates below.    Past Medical History:  Diagnosis Date  . Anxiety   . Arthritis    osteoarthritis  . Asthma   . C. difficile diarrhea   . Candida esophagitis ([place])   . Chronic sinusitis   . Depression   . Depression   . Diverticulosis   . DJD (degenerative joint disease)     L5 compression fracture  . Edema extremities   . Fibromyalgia   . GERD (gastroesophageal reflux disease)   . Gout   . Hyperlipidemia   . Hypertension   . Osteoarthritis   . Osteopenia   . Panic disorder   . Renal failure, unspecified   . Spinal stenosis of lumbar region   . Tubular adenoma of colon   . Wrist fracture, left    Past Surgical History:  Procedure Laterality Date  . ADENOIDECTOMY    . CARPAL TUNNEL RELEASE  2007, 2009   Bilateral  . CATARACT EXTRACTION, BILATERAL    . CHOANAL ADENIODECTOMY    . DIAGNOSTIC LARYNGOSCOPY N/A 11/05/2015   Procedure: DIAGNOSTIC LARYNGOSCOPY AND ESOPHAGOSCOPY;  Surgeon: Rozetta Nunnery, MD;  [facility];  Service: ENT;  Laterality: N/A;  . ganglion cyct removal on fingers     right  . ganglion cyst  wrist    . KNEE ARTHROSCOPY     right  . NASAL SINUS SURGERY    . OPEN REDUCTION INTERNAL FIXATION (ORIF) DISTAL RADIAL FRACTURE Left 12/18/2016   Procedure: OPEN REDUCTION INTERNAL FIXATION (ORIF) DISTAL RADIAL FRACTURE;  Surgeon: Iran Planas,  MD;  Location: [facility];  Service: Orthopedics;  Laterality: Left;  . SHOULDER SURGERY  2004, 07/30/10, 01/2011   after her right humeral neck fracture, revision hemiarthroplasty 2004 for persistent pain, revision reverse arthroplasty December 2011 for persistent pain, incision and drainage with poly-exchange 01/26/2011 for possible prosthetic joint infection.  . TONSILLECTOMY AND ADENOIDECTOMY    . TOTAL ABDOMINAL HYSTERECTOMY    . TOTAL HIP ARTHROPLASTY  2004   right  . TOTAL KNEE ARTHROPLASTY  2006   right  . TOTAL SHOULDER REPLACEMENT  2002   right   Social History   Tobacco Use  . Smoking status: Never Smoker  . Smokeless tobacco: Never Used  Substance Use Topics  . Alcohol use: No   family history includes Bladder Cancer in her father; Breast cancer in her unknown relative; Colon polyps in her unknown relative; Depression in her son; Diabetes in her mother; Hypertension in her mother.  ROS as above:  Medications: Current Outpatient Medications  Medication Sig Dispense Refill  . albuterol (PROVENTIL HFA;VENTOLIN HFA) 108 (90 Base) MCG/ACT inhaler Inhale 2 puffs into the lungs every 6 (six) hours as needed for wheezing or shortness of breath. 1 Inhaler 0  . ALPRAZolam (XANAX) 0.5 MG tablet Take 1 pill 3x daily for anxeity as needed. May take a 4th pill occasionally as needed. 100 tablet 3  . AMBULATORY NON FORMULARY MEDICATION [place].  Dx: Rheumatoid Arthritis.  Use daily as needed for ambulation. 1 Units 0  . amLODipine (NORVASC) 10 MG tablet TAKE 1 TABLET BY MOUTH ONCE DAILY 90 tablet 0  . Calcium Carbonate-Vitamin D (CALCIUM 600+D) 600-400 MG-UNIT per tablet Take 1 tablet by mouth 2 (two) times daily.    . cetirizine (ZYRTEC) 10 MG tablet Take 1 tablet (10 mg total) by mouth daily. 30 tablet 11  . clotrimazole-betamethasone (LOTRISONE) cream Apply 1 application topically 2 (two) times daily. 45 g 1  . diclofenac sodium (VOLTAREN) 1 % GEL Apply 4 g topically 4 (four) times  daily. To affected joint. 500 g 99  . diphenhydrAMINE (BENADRYL) 25 MG tablet Take 25 mg by mouth every 6 (six) hours as needed. For allergies    . diphenoxylate-atropine (LOMOTIL) 2.5-0.025 MG tablet One to 2 tablets by mouth 4 times a day as needed for diarrhea. 30 tablet 3  . metroNIDAZOLE (METROGEL) 0.75 % gel Apply 1 application topically 2 (two) times daily. 45 g 3  . mirtazapine (REMERON) 30 MG tablet Take 1 tablet (30 mg total) by mouth at bedtime. 90 tablet 1  . montelukast (SINGULAIR) 10 MG tablet Take 1 tablet (10 mg total) by mouth at bedtime. 90 tablet 3  . mupirocin ointment (BACTROBAN) 2 % Apply to affected area TID for 7 days. 30 g 3  . omeprazole (PRILOSEC) 20 MG capsule TAKE 1 CAPSULE BY MOUTH ONCE DAILY 90 capsule 1  . ondansetron (ZOFRAN ODT) 4 MG disintegrating tablet Take 1 tablet (4 mg total) by mouth every 8 (eight) hours as needed for nausea or vomiting. 20 tablet 12  . Oxycodone HCl 10 MG TABS Take 10mg  po 2-3x per day as needed for pain 70 tablet 0  . polyethylene glycol (MIRALAX / GLYCOLAX) packet Take 17 g by mouth daily. 14 each 0  . promethazine (PHENERGAN) 25 MG tablet Take 1 tablet (25 mg total) by mouth every 6 (six) hours as needed for nausea or vomiting. 30 tablet 2  . simvastatin (ZOCOR) 10 MG tablet TAKE 1 TABLET BY MOUTH ONCE DAILY 90 tablet 0  . triamterene-hydrochlorothiazide (MAXZIDE-25) 37.5-25 MG tablet Take 1 tablet by mouth daily. 90 tablet 3  . TRINTELLIX 5 MG TABS tablet Take 1 tablet (5 mg total) by mouth daily. 30 tablet 12  . zoledronic acid (RECLAST) 5 MG/100ML SOLN Inject 5 mg into the vein. Once yearly. In June     No current facility-administered medications for this visit.    Allergies  Allergen Reactions  . Bee Venom Anaphylaxis    Has epi-pen  . Ceftin [Cefuroxime Axetil] Anaphylaxis  . Ciprofloxacin Anaphylaxis and Palpitations  . Ephedrine Other (See Comments) and Palpitations    "knocks me out"  . Sulfonamide Derivatives  Anaphylaxis  . Telithromycin Anaphylaxis    Reaction: also blurred vision   . Valsartan Anaphylaxis  . Verapamil Anaphylaxis  . Venlafaxine Other (See Comments)    Insomnia, panic  . Beta Adrenergic Blockers     bradycardia  . Cephalosporins     Allergic Reaction  . Clindamycin/Lincomycin     Dry skin and itching/ Cdiff  . Cymbalta [Duloxetine Hcl] Other (See Comments)    Reaction:Confusion and " I fall down"  . Doxazosin     "blurred vision and irritable"  . Gabapentin Other (See Comments)    Reaction: headache  . Hydralazine     HA, Diarrhea  . Lamotrigine     Patient unsure at this time  . Morphine Other (  See Comments)    Medication has no effect with pain  . Oxycodone-Acetaminophen Hives    Takes plain oxy IR at home (May 2018)  . Pregabalin Swelling  . Prochlorperazine Edisylate   . Pseudoephedrine Other (See Comments)    Reaction: hyperventilates and blacks out  . Relafen [Nabumetone] Swelling  . Zoloft [Sertraline] Other (See Comments)    "nervous/jittery"  . Doxycycline Hives    Health Maintenance Health Maintenance  Topic Date Due  . INFLUENZA VACCINE  03/01/2018  . TETANUS/TDAP  02/20/2020  . DEXA SCAN  Completed  . PNA vac Low Risk Adult  Completed     Exam:  BP (!) 145/81   Pulse (!) 102   Wt 144 lb (65.3 kg)   BMI 28.12 kg/m  Gen: Well NAD HEENT: EOMI,  MMM Lungs: Normal work of breathing. CTABL Heart: RRR no MRG Abd: NABS, Soft. Nondistended, Nontender Exts: Brisk capillary refill, warm and well perfused.  Spine: Thoracic kyphosis and cervical lordosis present.  Mildly tender to palpation lumbar spine. Psych: Alert and oriented.  Affect is tearful.  Thought processes somewhat tangential.  No SI or HI expressed.  Assessment and Plan: 84 y.o. female with   Anxiety depression: Acutely worse due to the death of a beloved dog.  Plan to continue current regimen and recheck in 1 month.  Patient declines counseling.  Chronic pain: Stable  continue current regimen.  [facility] care management referred for transportation issues.   Orders Placed This Encounter  Procedures  . AMB Referral to [place] [facility] Management    Referral Priority:   Medium    Referral Type:   Consultation    Referral Reason:   [facility]Care Management    Number of Visits Requested:   1   No orders of the defined types were placed in this encounter.    Discussed warning signs or symptoms. Please see discharge instructions. Patient expresses understanding.  I spent 25 minutes with this patient, greater than 50% was face-to-face time counseling regarding mood and future plans.

## 2017-12-18 NOTE — Patient Outreach (Signed)
[facility]) Care Management  12/18/2017  CHELLE CAYTON 1933/07/10 [id number]   Telephone Screen  Referral Date:12/18/17 Referral Source: MD office Referral Reason: " patient has trouble with transport getting to appts, she does not drive and her husband does not as well, needs help getting to appts, kidney failure" Insurance: Medicare   Outreach attempt # 1  to patient. Spoke with patient and screening completed. Patient HOH.    Social: Patient resides in her home along with her spouse who has several medical issues of his own and requires assistance. She voices that she is fairly independent with ADLs/IADLs. She has children who come and help out as much as their work schedules will allow. She denies any recent falls. She is using a [place]. Patient voices that her and her spouse are unable to drive anymore. She voices that her dtr comes from [facility] to help take her to appts. Patient interested in transportation options to help her get back and forth to medical appts to not have to rely on her dtr as much.   Conditions: Per chart review, patient has PMH of CKD stag 3, anxiety, depression, arthritis, DJD, HLD, HTN, OA and tubular adenoma of colon. Patient voices that she is able to manage her chronic conditions. She reports that she has BP monitor in the home but does not check BP often as her BP remains well controlled. She voices that the main issue she struggles with is her anxiety and depression. Patient scored 20 on PHQ screening. She states that she is taking meds to help. She denies any suicidal/homicidal ideation. Patient voices that she saw a counselor in the past but did not think it was beneficial. She declined assistance with managing depression and anxiety during this call and voiced that she would only like to "continue talking with PCP about it."   Medications: Patient voices that she is taking about less than 10 meds. She denies any issues affording and/or  obtaining meds. Patient voices that she is self managing her meds. She is filling med planner weekly.    Appointments: Patient saw PCP earlier today. She goes back to see PCP on 01/26/18. She also reports some several dental appts.    Advance Directives: None. Patient interested in further info.    Consent: [facility] services reviewed and discussed. Patient declines any RN and pharmacy needs at this time. She is agreeable to SW assistance.    Plan: RN CM will make [facility] SW referral for possible transportation resources and assistance.  RN CM will send advance directive info via mail to patient.    Enzo Montgomery, RN,BSN,CCM [facility] Management Telephonic Care Management Coordinator Direct Phone: [phone] Toll Free: [phone] Fax: [phone]

## 2017-12-20 ENCOUNTER — Other Ambulatory Visit: Payer: Self-pay

## 2017-12-20 NOTE — Patient Outreach (Signed)
[place]) Care Management  12/20/2017  [name] 1932/12/09 [id number]   Care Coordination   Incoming call from rachel at PCP office. She advised that patient had called MD office reporting that she was waiting to hear from [facility] regarding transportation assistance and has not heard from anyone yet. She has an appt this week to the dentist and wanted transportation. Advised that this RN CM had spoken with patient and competed screening and referred patient to SW. Patient was made aware that a SW would be contacting her soon. RN CM called patient and left voicemail advising that she had been referred to a [facility] SW and that someone would be calling her soon. Encouraged patient to consider rescheduling appt or finding alternate transportation as SW would not be able to arrange transport that soon.    Enzo Montgomery, RN,BSN,CCM [place] Management Telephonic Care Management Coordinator Direct Phone: [phone] Toll Free: [phone] Fax: [phone]

## 2017-12-20 NOTE — Patient Outreach (Signed)
[facility]) Care Management  12/20/2017  MONICIA TSE 1933/01/31 [id number]  BSW contacted patient to follow up on referral for transportation assistance. HIPAA identifiers verified. Patient lives in [place] with her husband who has recently lost the ability to drive. Patient states she does have the ability to drive but due to her high levels of anxiety, her primary care physician has instructed her not to drive at this time. Patient has a dental appointment scheduled this week and would like transportation.  BSW introduced [facility] Care Management services to patient and explained the boundaries with which we can provide transportation. Patient understands that [facility] is unable to transport to appointments that are not to the primary physician or a specialist. Patient requesting transportation to her primary physician in June. In the past patient has utilized her neighbor to transport her to certain appointments but states her neighbor has a recent history of strokes and suffers "double vision" so patient does not feel safe to ride with neighbor. Patients daughter lives in [place] and is able to transport patient and her husband as long as she plans her work schedule accordingly. Patient reports her daughter attends all appointments for patients husband in order to gather information.  BSW discussed [facility] transportation benefit as a bridge for acute needs while assisting patient to find a long-term solution. BSW explored alternate transportation options with patient. Patient reports she has already contacted [facility] and reports "they fill up so fast and do not have availability until mid-June".  Patient gave BSW verbal permission to contact [facility] to conduct a referral and request transportation for patients appointment on 6/18 to see her primary care physician. Patient has traditional medicare with a BCBS  supplement. Patient unsure if this supplement offers a transportation benefit. Patient also gave BSW verbal permission to contact [employer]BS to inquire about benefits available under patients plan.  Patient states she believes her daughter plans to transport on 6/28 because her daughter is who set up that appointment date. Patient reports Dr. Georgina Snell wanting to see her sooner due to high levels of anxiety and depression. Patient reports a history of depression indicating she was seen by a counselor in [place] for 5 years but is no longer under that counselors care since moving to [place]. Patient reports seeking services from a provider in [place] but states they "didn't mesh well". Patient recently euthanized her dog two weeks ago. Patient identifies a strong connection with this dog and referred to it as her "therapy". Patient states she is lost without this connection and her husband "doesn't understand".   BSW explained [facility] Care Management Social Work roles and the difference in a BSW and CSW. Patient agreeable to have a CSW contact her to discuss reported anxiety and depression. Patient understands CSW will be able to assist in a supportive role to patient as well as assist patient in establishing a therapeutic relationship with a provider in [place] if desired. Patient very interested in supportive calls from [place] but concerned she will be unable to find transportation to a provider due to transportation barriers as listed above.  BSW contacted [employer] to inquire about member benefits. Patient does not have benefits in regards to transportation. BSW contacted [facility] and spoke to Bee the Solicitor. Patient has utilized this service in the past and is already registered as a member. Luellen Pucker has added patient to the list for her June 18 appointment and stated "it  shouldn't be hard to fill". BSW informed Luellen Pucker of patients appointment on June 28 and a tentative need for  transportation. BSW will encourage patient to contact Luellen Pucker if transportation is needed.  Plan: BSW to contact patient this week to confirm transportation arrangements through [employer]. BSW to refer patient to CSW for further exploration of patients anxiety and depression needs.  Daneen Schick, BSW, CDP Social Worker Cell # [phone] [email or web address]

## 2017-12-21 ENCOUNTER — Other Ambulatory Visit: Payer: Self-pay

## 2017-12-21 NOTE — Patient Outreach (Signed)
[facility]) Care Management  12/21/2017  [name] April 09, 1933 [id number]   BSW contacted patient to confirm referral to [facility]. HIPAA identifiers verified. BSW communicated with patient that [facility] has her appointment to see Dr. Georgina Snell 6/18 on their calendar and plans to transport patient. BSW encouraged patient to continue utilizing [facility] to assist with transportation needs. BSW explained the importance of contacting them as soon as an appointment is made in order to secure transportation. Patient stated "okay".  BSW to sign off on case at this time. BSW reminded patient that a CSW will contact her to address her concerns of anxiety and depression. Patient continues to be open to CSW's call.  Daneen Schick, BSW, CDP Social Worker Cell # [phone] [email or web address]

## 2017-12-26 ENCOUNTER — Encounter: Payer: Self-pay | Admitting: *Deleted

## 2017-12-26 ENCOUNTER — Other Ambulatory Visit: Payer: Self-pay | Admitting: *Deleted

## 2017-12-26 NOTE — Patient Outreach (Signed)
[facility]) Care Management  12/26/2017  SHELBEY SPINDLER [birth year] [id number]   CSW was able to make initial contact with patient today to perform phone assessment, as well as assess and assist with social work needs and services.  CSW introduced self, explained role and types of services provided through [facility] ([facility]).  CSW further explained to patient that CSW works with Daneen Schick, social work Social worker, also with [facility]. CSW then explained the reason for the call, indicating that Mrs. Humble thought that patient would benefit from social work services and resources to assist with counseling and supportive services for depression and anxiety, as well as referrals to counselors/therapists in [place], [place].  CSW obtained two HIPAA compliant identifiers from patient, which included patient's name and date of birth. Patient admits that she has become increasingly depressed and anxious within the last few months, as a result of her and her husband's failing health and the recent loss of her companion (dog), three weeks ago.  Patient reported that she has seen a therapist in the past but that she terminated services with that therapist when she moved to [place], [place], a few years ago.  Patient has since seen a therapist at [facility], but indicated that she and the therapist did not "mesh well".  Patient only saw this therapist on one occasion before terminating services with them.  Patient is interested in finding a therapist/counselor in [place], [place], requesting CSW's assistance with this process.   CSW agreed to perform an extensive search of all therapists/counselors in the [place], [place] area and report findings to patient at the initial home visit, scheduled for Monday, January 01, 2018 at [place] also agreed to provide  counseling and supportive services to patient until she is able to get established with a therapist/counselor.  CSW will educate patient on deep breathing exercises and relaxation techniques to help decrease patient's symptoms of anxiety and depression.  No additional social work needs have been identified at this time, as Mrs. Humble was able to assist patient with transportation resources. [facility] CM Care Plan Problem One     Most Recent Value  Care Plan Problem One  Patient is experiencing symptoms of anxiety and depression.  Role Documenting the Problem One  Clinical Social Worker  Care Plan for Problem One  Active  [facility] Long Term Goal   Patient's symptoms of anxiety and depression will decrease with counseling and supportive services offered by CSW with [facility], within the next three months.  [facility] Long Term Goal Start Date  12/26/17  Interventions for Problem One Long Term Goal  CSW will meet with patient for an initial home visit within the next week for CSW to provide counseling and supportive services.  [facility] CM Short Term Goal #1   Patient will practice deep breathing exercises and relaxation techniques to help cope with symptoms of anxiety and depression, within the next 30 days.  [facility] CM Short Term Goal #1 Start Date  12/26/17  Interventions for Short Term Goal #1  CSW will meet with patient for an initial home visit to educate and train patient on how to effectively utilize deep breathing exercises and relaxation techniques.  [facility] CM Short Term Goal #2   Patient will decide on a counselor/therapist of choice, in [place], [place], within the next 30 days.  [facility] CM Short Term Goal #2 Start  Date  12/26/17  Interventions for Short Term Goal #2  CSW will perform an extensive search of all therapists/counselors in [place], [place] to provide to patient during the initial home visit.    Nat Christen, BSW, [name], [name]  Licensed Teacher, adult education Health System  Mailing [place] [address] [place], [place] [address] Physical Address-[address], [place], [place] [address] Toll Free Main # [phone] Fax # [phone] Cell # [phone]  Office # [phone] [email or web address]

## 2017-12-27 ENCOUNTER — Ambulatory Visit: Payer: Self-pay | Admitting: *Deleted

## 2017-12-27 ENCOUNTER — Other Ambulatory Visit: Payer: Self-pay

## 2018-01-01 ENCOUNTER — Encounter: Payer: Self-pay | Admitting: Family Medicine

## 2018-01-01 ENCOUNTER — Other Ambulatory Visit: Payer: Self-pay | Admitting: *Deleted

## 2018-01-01 ENCOUNTER — Ambulatory Visit (INDEPENDENT_AMBULATORY_CARE_PROVIDER_SITE_OTHER): Payer: Medicare Other | Admitting: Family Medicine

## 2018-01-01 VITALS — BP 138/70 | HR 98 | Wt 148.0 lb

## 2018-01-01 DIAGNOSIS — F411 Generalized anxiety disorder: Secondary | ICD-10-CM | POA: Diagnosis not present

## 2018-01-01 DIAGNOSIS — F332 Major depressive disorder, recurrent severe without psychotic features: Secondary | ICD-10-CM

## 2018-01-01 MED ORDER — ALPRAZOLAM 0.5 MG PO TABS: 0.5000 mg | ORAL_TABLET | Freq: Four times a day (QID) | ORAL | 3 refills | Status: DC

## 2018-01-01 NOTE — Patient Outreach (Signed)
[facility]) Care Management  01/01/2018  RIO TABER [birth year] [id number]   CSW was able to meet with patient today to perform the initial home visit, as well as provide counseling and supportive services for symptoms of anxiety and depression.  CSW was also able to get patient an appointment scheduled with her Primary Care Physician, Dr. Lynne Leader for 11:30AM today (Monday, January 01, 2018) to discuss her current mental status, in addition to seeing about having her antidepressant medications increased and/or changed.  Patient was very tearful during the home visit with CSW today, talking a lot about the recent death/loss of her dog Aram Beecham) of 9 years, that patient had to put down a few weeks ago.  Patient also talked about her youngest son, who is only 108 years-of-age, but dying of a deadly illness that will more than likely take his life within the next year, year and a half, at most. Patient has four children, but patient is convinced that "no one understands her or the pain she is experiencing", not even her husband of 55 years.  Patient reported that Aram Beecham provided her with all the love and affection that she needed, as patient admits that she "constantly needs physical contact to feel loved and accepted".  Patient would very much like to purchase a therapy dog, but her husband has already made it very clear that they will not have any more pets once their current dog [facility]) dies.  Patient believes that she had a good support system at one time, but now she feels as though she is all alone.  Patient's friend and neighbor is experiencing mini-strokes, almost daily, so she fears that she will also lose this friend within the next few months. Patient is currently prescribed LEXAPRO 20 mg tablet by mouth daily and Xanax .5 mg tablet by mouth, 4 times daily.  Patient reports taking her psychotropics medications exactly as prescribed and is agreeable to her medications being  increased, changed or having another medication added.  CSW was able to briefly speak with patient about utilizing deep breathing exercises and relaxation techniques, but mostly allowed patient to do all the talking today.  Patient also talked about her children and how her mother would have suicidal tendencies.  Patient denies feeling homicidal or suicidal at this time. Patient is not interested in attending any support programs and is skeptical about getting re-involved with a therapist in the [place], [place] area.  CSW was able to provide patient with a list of therapists, encouraging patient to perform a little background information on each before deciding who she would like to go and see for counseling services.  Patient voiced understanding and was agreeable to this plan.  In the meantime, CSW has agreed to continue to provide counseling and supportive services to patient until she becomes established with a therapist.  CSW has agreed to meet with patient again for the next scheduled home visit on Monday, January 08, 2018.   [facility] CM Care Plan Problem One     Most Recent Value  Care Plan Problem One  Patient is experiencing symptoms of anxiety and depression.  Role Documenting the Problem One  Clinical Social Worker  Care Plan for Problem One  Active  THN Long Term Goal   Patient's symptoms of anxiety and depression will decrease with counseling and supportive services offered by CSW with [facility], within the next three months.  [facility]N Long Term Goal Start Date  12/26/17  [facility] CM Short Term Goal #1   Patient will practice deep breathing exercises and relaxation techniques to help cope with symptoms of anxiety and depression, within the next 30 days.  [facility] CM Short Term Goal #1 Start Date  12/26/17  [facility] CM Short Term Goal #2   Patient will decide on a counselor/therapist of choice, in [place], [place], within the next 30 days.  [facility] CM Short Term Goal  #2 Start Date  12/26/17  [facility] CM Short Term Goal #3  Patient will review literature provided to her by CSW regarding "Sign's & Symptoms of Depression", within the next two weeks.  [facility] CM Short Term Goal #3 Start Date  01/01/18  Interventions for Short Tern Goal #3  CSW provided patient with literature on depression for her independent review.    Nat Christen, BSW, [name], [name]  Licensed Education officer, environmental Health System  Mailing [place] [address], [place], [place] [address] Physical Address-[address], [place], [place] [address] Toll Free Main # [phone] Fax # [phone] Cell # [phone]  Office # [phone] [email or web address]

## 2018-01-01 NOTE — Patient Instructions (Addendum)
Thank you for coming in today. I think counseling is a good idea.  We will work on that.  We will also think about finding an adult trained dog that wants to be held.   A therapy dog would be helpful.  Recheck as scheduled on Jun 18 or 28.

## 2018-01-01 NOTE — Progress Notes (Signed)
[name] is a 84 y.o. female who presents to [place]: [facility] today for anxiety.   [name] notes worsening anxiety.  She has increased life stressors recently.  Her dog which was a great comfort to her recently died.  Additionally her husband is in poor health and is having more health issues.  Additionally her youngest son diagnosed with high system atrophy which is a progressive neurodegenerative disorder.  This is a great distress to her as well.  She notes worsening anxiety.  Additionally as her dog's past she has less coping mechanisms now.  She is in the process of getting established with counseling for anxiety.  She is getting help with [facility] care management [name] [name], [name], [name]. She notes that she is using her Xanax 4x daily recently. It does help some.   ROS as above:  Exam:  BP 138/70   Pulse 98   Wt 148 lb (67.1 kg)   BMI 28.90 kg/m  Gen: Well NAD HEENT: EOMI,  MMM Lungs: Normal work of breathing. CTABL Heart: RRR no MRG Abd: NABS, Soft. Nondistended, Nontender Exts: Brisk capillary refill, warm and well perfused.  Psych: Alert and oriented. Tearful affect. Normal speech and though process.   Lab and Radiology Results No results found for this or any previous visit (from the past 72 hour(s)). No results found.   Assessment and Plan: 84 y.o. female with GAD: Worsening recently.  We spent a great deal of time discussing her current situation.  Plan to increase the xanax to 4x daily.  Will also look into resources of getting an emotional support animal or perhaps an adult dog in the community.  Additionally will proceed with therapy aided by [name].  Recheck with me on June 18th as scheduled.    No orders of the defined types were placed in this encounter.  No orders of the defined types were placed in this  encounter.    Historical information moved to improve visibility of documentation.  Past Medical History:  Diagnosis Date  . Anxiety   . Arthritis    osteoarthritis  . Asthma   . C. difficile diarrhea   . Candida esophagitis ([place])   . Chronic sinusitis   . Depression   . Depression   . Diverticulosis   . DJD (degenerative joint disease)     L5 compression fracture  . Edema extremities   . Fibromyalgia   . GERD (gastroesophageal reflux disease)   . Gout   . Hyperlipidemia   . Hypertension   . Osteoarthritis   . Osteopenia   . Panic disorder   . Renal failure, unspecified   . Spinal stenosis of lumbar region   . Tubular adenoma of colon   . Wrist fracture, left    Past Surgical History:  Procedure Laterality Date  . ADENOIDECTOMY    . CARPAL TUNNEL RELEASE  2007, 2009   Bilateral  . CATARACT EXTRACTION, BILATERAL    . CHOANAL ADENIODECTOMY    . DIAGNOSTIC LARYNGOSCOPY N/A 11/05/2015   Procedure: DIAGNOSTIC LARYNGOSCOPY AND ESOPHAGOSCOPY;  Surgeon: Rozetta Nunnery, MD;  Location: [facility];  Service: ENT;  Laterality: N/A;  . ganglion cyct removal on fingers     right  . ganglion cyst  wrist    . KNEE ARTHROSCOPY     right  . NASAL SINUS SURGERY    . OPEN REDUCTION INTERNAL FIXATION (ORIF) DISTAL RADIAL FRACTURE Left 12/18/2016  Procedure: OPEN REDUCTION INTERNAL FIXATION (ORIF) DISTAL RADIAL FRACTURE;  Surgeon: Iran Planas, MD;  Location: [facility];  Service: Orthopedics;  Laterality: Left;  . SHOULDER SURGERY  2004, 07/30/10, 01/2011   after her right humeral neck fracture, revision hemiarthroplasty 2004 for persistent pain, revision reverse arthroplasty December 2011 for persistent pain, incision and drainage with poly-exchange 01/26/2011 for possible prosthetic joint infection.  . TONSILLECTOMY AND ADENOIDECTOMY    . TOTAL ABDOMINAL HYSTERECTOMY    . TOTAL HIP ARTHROPLASTY  2004   right  . TOTAL KNEE ARTHROPLASTY  2006   right  . TOTAL SHOULDER REPLACEMENT   2002   right   Social History   Tobacco Use  . Smoking status: Never Smoker  . Smokeless tobacco: Never Used  Substance Use Topics  . Alcohol use: No   family history includes Bladder Cancer in her father; Breast cancer in her unknown relative; Colon polyps in her unknown relative; Depression in her son; Diabetes in her mother; Hypertension in her mother.  Medications: Current Outpatient Medications  Medication Sig Dispense Refill  . albuterol (PROVENTIL HFA;VENTOLIN HFA) 108 (90 Base) MCG/ACT inhaler Inhale 2 puffs into the lungs every 6 (six) hours as needed for wheezing or shortness of breath. 1 Inhaler 0  . ALPRAZolam (XANAX) 0.5 MG tablet Take 1 pill 3x daily for anxeity as needed. May take a 4th pill occasionally as needed. 100 tablet 3  . AMBULATORY NON FORMULARY MEDICATION [place].  Dx: Rheumatoid Arthritis.  Use daily as needed for ambulation. 1 Units 0  . amLODipine (NORVASC) 10 MG tablet TAKE 1 TABLET BY MOUTH ONCE DAILY 90 tablet 0  . Calcium Carbonate-Vitamin D (CALCIUM 600+D) 600-400 MG-UNIT per tablet Take 1 tablet by mouth 2 (two) times daily.    . cetirizine (ZYRTEC) 10 MG tablet Take 1 tablet (10 mg total) by mouth daily. 30 tablet 11  . clotrimazole-betamethasone (LOTRISONE) cream Apply 1 application topically 2 (two) times daily. 45 g 1  . diclofenac sodium (VOLTAREN) 1 % GEL Apply 4 g topically 4 (four) times daily. To affected joint. 500 g 99  . diphenhydrAMINE (BENADRYL) 25 MG tablet Take 25 mg by mouth every 6 (six) hours as needed. For allergies    . diphenoxylate-atropine (LOMOTIL) 2.5-0.025 MG tablet One to 2 tablets by mouth 4 times a day as needed for diarrhea. 30 tablet 3  . metroNIDAZOLE (METROGEL) 0.75 % gel Apply 1 application topically 2 (two) times daily. 45 g 3  . mirtazapine (REMERON) 30 MG tablet Take 1 tablet (30 mg total) by mouth at bedtime. 90 tablet 1  . montelukast (SINGULAIR) 10 MG tablet Take 1 tablet (10 mg total) by mouth at bedtime.  90 tablet 3  . mupirocin ointment (BACTROBAN) 2 % Apply to affected area TID for 7 days. 30 g 3  . omeprazole (PRILOSEC) 20 MG capsule TAKE 1 CAPSULE BY MOUTH ONCE DAILY 90 capsule 1  . ondansetron (ZOFRAN ODT) 4 MG disintegrating tablet Take 1 tablet (4 mg total) by mouth every 8 (eight) hours as needed for nausea or vomiting. 20 tablet 12  . Oxycodone HCl 10 MG TABS Take 10mg  po 2-3x per day as needed for pain 70 tablet 0  . polyethylene glycol (MIRALAX / GLYCOLAX) packet Take 17 g by mouth daily. 14 each 0  . promethazine (PHENERGAN) 25 MG tablet Take 1 tablet (25 mg total) by mouth every 6 (six) hours as needed for nausea or vomiting. 30 tablet 2  . simvastatin (ZOCOR) 10  MG tablet TAKE 1 TABLET BY MOUTH ONCE DAILY 90 tablet 0  . triamterene-hydrochlorothiazide (MAXZIDE-25) 37.5-25 MG tablet Take 1 tablet by mouth daily. 90 tablet 3  . TRINTELLIX 5 MG TABS tablet Take 1 tablet (5 mg total) by mouth daily. 30 tablet 12  . zoledronic acid (RECLAST) 5 MG/100ML SOLN Inject 5 mg into the vein. Once yearly. In June     No current facility-administered medications for this visit.    Allergies  Allergen Reactions  . Bee Venom Anaphylaxis    Has epi-pen  . Ceftin [Cefuroxime Axetil] Anaphylaxis  . Ciprofloxacin Anaphylaxis and Palpitations  . Ephedrine Other (See Comments) and Palpitations    "knocks me out"  . Sulfonamide Derivatives Anaphylaxis  . Telithromycin Anaphylaxis    Reaction: also blurred vision   . Valsartan Anaphylaxis  . Verapamil Anaphylaxis  . Venlafaxine Other (See Comments)    Insomnia, panic  . Beta Adrenergic Blockers     bradycardia  . Cephalosporins     Allergic Reaction  . Clindamycin/Lincomycin     Dry skin and itching/ Cdiff  . Cymbalta [Duloxetine Hcl] Other (See Comments)    Reaction:Confusion and " I fall down"  . Doxazosin     "blurred vision and irritable"  . Gabapentin Other (See Comments)    Reaction: headache  . Hydralazine     HA, Diarrhea  .  Lamotrigine     Patient unsure at this time  . Morphine Other (See Comments)    Medication has no effect with pain  . Oxycodone-Acetaminophen Hives    Takes plain oxy IR at home (May 2018)  . Pregabalin Swelling  . Prochlorperazine Edisylate   . Pseudoephedrine Other (See Comments)    Reaction: hyperventilates and blacks out  . Relafen [Nabumetone] Swelling  . Zoloft [Sertraline] Other (See Comments)    "nervous/jittery"  . Doxycycline Hives    Health Maintenance Health Maintenance  Topic Date Due  . INFLUENZA VACCINE  03/01/2018  . TETANUS/TDAP  02/20/2020  . DEXA SCAN  Completed  . PNA vac Low Risk Adult  Completed    Discussed warning signs or symptoms. Please see discharge instructions. Patient expresses understanding.

## 2018-01-04 ENCOUNTER — Ambulatory Visit: Payer: Self-pay | Admitting: Family Medicine

## 2018-01-05 ENCOUNTER — Encounter: Payer: Self-pay | Admitting: Family Medicine

## 2018-01-08 ENCOUNTER — Other Ambulatory Visit: Payer: Self-pay | Admitting: *Deleted

## 2018-01-08 NOTE — Patient Outreach (Signed)
[facility]) Care Management  01/08/2018  [name] 13-Feb-1933 [id number]   CSW was able to meet with patient today for a routine home visit.  Patient was tearful throughout most of the visit today, talking about all the current stressors in her life.  Patient misses her dog, Aram Beecham, and desperately wants her husband and daughter to allow her to purchase a new one.  Patient is aware that this prospect is very unlikely.  Patient is worried about her husband and the fact that he has fallen twice within the last week, despite currently receiving physical therapy in the home.  Patient is very concerned about her son that is actively dying, wondering how he will be able to care for himself once his condition begins to really deteriorate.  Last, patient is not getting a long with her daughter, believing that her daughter is placing too many demands and responsibilities on her. CSW offered counseling and supportive services, throughout the session.  CSW talked with patient about utilizing her deep breathing exercises, demonstrating appropriate use, as well as providing patient with literature as a reference.  Patient reported that she plans to contact [facility] today to see about scheduling a therapy appointment with her previous therapist. Patient admits that she is willing to give the therapist another try, hoping that they will "click this time".  CSW reminded patient that there are plenty other therapists in the [place] area that she can see, if she is unable to form a bond with her current therapist.  Patient already has the list of therapists and will keep the list as a reference, if needed. CSW spoke with patient about assisted living placement for her and her husband, but patient was not the least bit interested.  Patient reported, I know my husband is ready but I am not ready to leave my home".  CSW plans to revisit this conversation with patient over the course of  CSW's work with patient.  Patient is now interested in completing her Advanced Directives ([place] documents); therefore, CSW agreed to meet with patient on Monday January 15, 2018 at 9:00AM to assist with this process.  CSW will provide the forms for patient, along with a list of [place] affiliated facilities where patient can take the documents to have them notarized, free of charge. THN CM Care Plan Problem One     Most Recent Value  Care Plan Problem One  Patient is experiencing symptoms of anxiety and depression.  Role Documenting the Problem One  Clinical Social Worker  Care Plan for Problem One  Active  THN Long Term Goal   Patient's symptoms of anxiety and depression will decrease with counseling and supportive services offered by CSW with [place] [facility], within the next three months.  THN Long Term Goal Start Date  12/26/17  [facility] CM Short Term Goal #1   Patient will practice deep breathing exercises and relaxation techniques to help cope with symptoms of anxiety and depression, within the next 30 days.  THN CM Short Term Goal #1 Start Date  12/26/17  [facility] CM Short Term Goal #1 Met Date  01/08/18  Interventions for Short Term Goal #1  Patient was given information on deep breathing exercises, as well as educated on proper use.  THN CM Short Term Goal #2   Patient will decide on a counselor/therapist of choice, in [place], [place], within the next 30 days.  THN CM Short  Term Goal #2 Start Date  12/26/17  [facility] CM Short Term Goal #2 Met Date  01/08/18  Interventions for Short Term Goal #2  Patient plans to make contact with her prrevious therapist today at [facility] to schedule a therapy appointment.  THN CM Short Term Goal #3  Patient will review literature provided to her by CSW regarding "Sign's & Symptoms of Depression", within the next two weeks.  THN CM Short Term Goal #3 Start Date  01/01/18    [facility] CM  Care Plan Problem Two     Most Recent Value  Care Plan Problem Two  Patient does not have Advanced Directives in place.  Role Documenting the Problem Two  Clinical Social Worker  Care Plan for Problem Two  Active  THN CM Short Term Goal #1   CSW will assist patient with completion of Advanced Directives (Living Will and [place] documents) within the next week.  THN CM Short Term Goal #1 Start Date  01/08/18  Interventions for Short Term Goal #2   Patient will be given a list of [place] affilitated facilities where she can take the documents to have them notarized, once completed.    Nat Christen, BSW, [name], [name]  Licensed Education officer, environmental Health System  Mailing [place] [address], [place], [place] [address] Physical Address-[address], [place], [place] [address] Toll Free Main # [phone] Fax # [phone] Cell # [phone]  Office # [phone] [email or web address]

## 2018-01-10 NOTE — Patient Outreach (Signed)
[facility]) Care Management  01/10/2018  [name] 1933-07-20 [id number]   BSW mailed patient a welcome packet as well as an advanced directive packet as requested by CSW, [employer]  Daneen Schick, Arita Miss, CDP Social Worker [phone]

## 2018-01-15 ENCOUNTER — Other Ambulatory Visit: Payer: Self-pay | Admitting: *Deleted

## 2018-01-15 NOTE — Patient Outreach (Signed)
[facility]) Care Management  01/15/2018  RAEONNA MILO 1933-07-10 [id number]  BSW received an in-basket message from CSW, [employer], indicating patient received the Advanced Directive packet in the mail but denied a welcome packet being included. BSW to mail patient a welcome packet on today's date.  Daneen Schick, Arita Miss, CDP Social Worker [phone]

## 2018-01-15 NOTE — Patient Outreach (Signed)
[facility]) Care Management  01/15/2018  [name] [birth year] [id number]   CSW was able to meet with patient today to perform a routine home visit.  CSW was able to assist patient with completion of her Advanced Directives (Living Will and [place] documents) and provided patient with a list of [place] affiliated facilities where patient can take the forms to have them witnessed and notarized.  Patient reported that she will take the documents with her to her next scheduled physician appointment so that she can provide her Primary Care Physician, Dr. Lynne Leader with a copy for her medical record.  Patient was also given a pink MOST (Medical Orders for Scope of Treatment) form to discuss with Dr. Georgina Snell.  CSW offered counseling and supportive services to patient, as patient is still experiencing symptoms of anxiety and depression.  Although patient's symptoms have somewhat subsided, patient still becomes very tearful when talking about her deceased dog, Geoffery Spruce.  Patient is coming to terms with the fact that she is unable to get another dog, unable to provide the care that is required.  Not to mention, patient's husband, [name] and patient's daughter, [name] have already said that they will not be bringing another dog into their home.  Patient indicated that she has been reviewing the literature provided to her by CSW with regards to "Sign's and Symptoms of Depression".  Patient admits that she has not been doing so well with remembering to utilize her deep breathing exercises and relaxation techniques when becoming stressed or overwhelmed.  CSW encouraged patient to try to remember to utilize these techniques, as they appear to have worked well for her in the past.  CSW agreed to meet with patient again on Monday, June 24th to perform a discharge home visit, as no additional social work needs have been identified at this time.  THN CM Care Plan  Problem One     Most Recent Value  Care Plan Problem One  Patient is experiencing symptoms of anxiety and depression.  Role Documenting the Problem One  Clinical Social Worker  Care Plan for Problem One  Active  THN Long Term Goal   Patient's symptoms of anxiety and depression will decrease with counseling and supportive services offered by CSW with [place] [facility], within the next three months.  THN Long Term Goal Start Date  12/26/17  [facility] CM Short Term Goal #1   Patient will practice deep breathing exercises and relaxation techniques to help cope with symptoms of anxiety and depression, within the next 30 days.  THN CM Short Term Goal #1 Start Date  12/26/17  [facility] CM Short Term Goal #1 Met Date  01/08/18  [facility] CM Short Term Goal #2   Patient will decide on a counselor/therapist of choice, in [place], [place], within the next 30 days.  THN CM Short Term Goal #2 Start Date  12/26/17  [facility] CM Short Term Goal #2 Met Date  01/08/18  THN CM Short Term Goal #3  Patient will review literature provided to her by CSW regarding "Sign's & Symptoms of Depression", within the next two weeks.  THN CM Short Term Goal #3 Start Date  01/01/18  [facility] CM Short Term Goal #3 Met Date  01/15/18  Interventions for Short Tern Goal #3  Patient admits to reviewing literature provided to her by CSW regarding "Signs and Symptoms of Depression".    [facility] CM Care Plan Problem Two  Most Recent Value  Care Plan Problem Two  Patient does not have Advanced Directives in place.  Role Documenting the Problem Two  Clinical Social Worker  Care Plan for Problem Two  Active  THN CM Short Term Goal #1   CSW will assist patient with completion of Advanced Directives (Living Will and [place] documents) within the next week.  THN CM Short Term Goal #1 Start Date  01/08/18  [facility] CM Short Term Goal #1 Met Date   01/15/18  Interventions for Short Term Goal #2   Advanced Directives  have been completed and patient has been given a list of [place] affilitated facilities where she can take the documents to have them notarized.      Nat Christen, BSW, [name], [name]  Licensed Education officer, environmental Health System  Mailing [place] [address], [place], [place] [address] Physical Address-[address], [place], [place] [address] Toll Free Main # [phone] Fax # [phone] Cell # [phone]  Office # [phone] [email or web address]

## 2018-01-16 ENCOUNTER — Encounter: Payer: Self-pay | Admitting: Family Medicine

## 2018-01-16 ENCOUNTER — Ambulatory Visit (INDEPENDENT_AMBULATORY_CARE_PROVIDER_SITE_OTHER): Payer: Medicare Other | Admitting: Family Medicine

## 2018-01-16 VITALS — BP 130/69 | HR 98 | Ht 62.0 in | Wt 153.0 lb

## 2018-01-16 DIAGNOSIS — M069 Rheumatoid arthritis, unspecified: Secondary | ICD-10-CM

## 2018-01-16 DIAGNOSIS — G894 Chronic pain syndrome: Secondary | ICD-10-CM

## 2018-01-16 DIAGNOSIS — F332 Major depressive disorder, recurrent severe without psychotic features: Secondary | ICD-10-CM

## 2018-01-16 DIAGNOSIS — Z66 Do not resuscitate: Secondary | ICD-10-CM | POA: Diagnosis not present

## 2018-01-16 DIAGNOSIS — F411 Generalized anxiety disorder: Secondary | ICD-10-CM

## 2018-01-16 MED ORDER — FLUTICASONE PROPIONATE 50 MCG/ACT NA SUSP: 2.0000 | Freq: Every day | NASAL | 2 refills | Status: DC

## 2018-01-16 MED ORDER — OXYCODONE HCL 10 MG PO TABS: ORAL_TABLET | ORAL | 0 refills | Status: DC

## 2018-01-16 NOTE — Progress Notes (Signed)
[name] is a 84 y.o. female who presents to [place] [place] today for follow up anxiety.   [name] has been seen in the past for anxiety and depression.  She currently is managed with the below medications.  As noted previously she has had difficulty following the death of her dog.  She increased her Xanax to 4 times a day at the last month with my understanding.  She notes this is helpful.  She is here today also to fill out advance directives.   Additionally she has chronic pain which is managed with oxycodone.  She takes the medication as directed and notes that it does help and allow her to function and remain independent at home.  She denies severe constipation or fatigue.    ROS as above:  Exam:  BP 130/69   Pulse 98   Ht 5\' 2"  (1.575 m)   Wt 153 lb (69.4 kg)   BMI 27.98 kg/m  Gen: Well NAD HEENT: EOMI,  MMM Lungs: Normal work of breathing. CTABL Heart: RRR no MRG Abd: NABS, Soft. Nondistended, Nontender Exts: Brisk capillary refill, warm and well perfused.  Psych: Alert and oriented normal speech thought process and affect.  No SI or HI expressed. MSK: Thoracic spine with significant kyphosis.  Nontender to midline.  Depression screen [facility] 2/9 01/16/2018 12/26/2017 12/18/2017 11/24/2017 03/13/2017  Decreased Interest 3 3 3 1 2   Down, Depressed, Hopeless 2 3 3 2 2   PHQ - 2 Score 5 6 6 3 4   Altered sleeping 2 3 3 1  0  Tired, decreased energy 2 2 2 2 2   Change in appetite 1 3 3  0 3  Feeling bad or failure about yourself  3 2 2 2 2   Trouble concentrating 1 2 2 1 2   Moving slowly or fidgety/restless 0 2 2 0 0  Suicidal thoughts 0 0 0 0 1  PHQ-9 Score 14 20 20 9 14   Difficult doing work/chores Somewhat difficult Somewhat difficult Somewhat difficult Somewhat difficult -  Some encounter information is confidential and restricted. Go to Review Flowsheets  activity to see all data.  Some recent data might be hidden   GAD 7 : Generalized Anxiety Score 01/16/2018 11/24/2017 03/13/2017 02/15/2017  Nervous, Anxious, on Edge 3 2 3 3   Control/stop worrying 3 2 3 3   Worry too much - different things 3 2 3 3   Trouble relaxing 3 2 3 3   Restless 2 2 2  0  Easily annoyed or irritable 3 2 2 3   Afraid - awful might happen 3 2 0 2  Total GAD 7 Score 20 14 16 17   Anxiety Difficulty - Somewhat difficult - -  Some encounter information is confidential and restricted. Go to Review Flowsheets activity to see all data.      Lab and Radiology Results No results found for this or any previous visit (from the past 72 hour(s)). No results found.   Assessment and Plan: 84 y.o. female with  Anxiety: Stable but not well controlled.  Plan to continue current regimen and recheck as previously scheduled on June 28th. Plan to wean down the Xanax in the near future.   Chronic Pain: Stable. Tolerating medication. Plan to continue current medications. Refilled today.  Patient researched [facility] Controlled Substance Reporting System. Safety of xanax and oxycodone continues to be an issue. These medications allow her to continue to function at home and the benefit is worth  the risk.   Advanced Directives: Discussed in detail. Pt has MOST form filled out and will be scanned today.  DNR status confirmed.  Advanced directive form needs Notary.   I spent 25 minutes with this patient, greater than 50% was face-to-face time counseling regarding treatment plan, anxiety and advanced directive.    No orders of the defined types were placed in this encounter.  Meds ordered this encounter  Medications  . Oxycodone HCl 10 MG TABS    Sig: Take 10mg  po 2-3x per day as needed for pain    Dispense:  70 tablet    Refill:  0  . fluticasone (FLONASE) 50 MCG/ACT nasal spray    Sig: Place 2 sprays into both nostrils daily.    Dispense:  16 g    Refill:  2      Historical information moved to improve visibility of documentation.  Past Medical History:  Diagnosis Date  . Anxiety   . Arthritis    osteoarthritis  . Asthma   . C. difficile diarrhea   . Candida esophagitis ([place])   . Chronic sinusitis   . Depression   . Depression   . Diverticulosis   . DJD (degenerative joint disease)     L5 compression fracture  . Edema extremities   . Fibromyalgia   . GERD (gastroesophageal reflux disease)   . Gout   . Hyperlipidemia   . Hypertension   . Osteoarthritis   . Osteopenia   . Panic disorder   . Renal failure, unspecified   . Spinal stenosis of lumbar region   . Tubular adenoma of colon   . Wrist fracture, left    Past Surgical History:  Procedure Laterality Date  . ADENOIDECTOMY    . CARPAL TUNNEL RELEASE  2007, 2009   Bilateral  . CATARACT EXTRACTION, BILATERAL    . CHOANAL ADENIODECTOMY    . DIAGNOSTIC LARYNGOSCOPY N/A 11/05/2015   Procedure: DIAGNOSTIC LARYNGOSCOPY AND ESOPHAGOSCOPY;  Surgeon: Rozetta Nunnery, MD;  Location: [facility];  Service: ENT;  Laterality: N/A;  . ganglion cyct removal on fingers     right  . ganglion cyst  wrist    . KNEE ARTHROSCOPY     right  . NASAL SINUS SURGERY    . OPEN REDUCTION INTERNAL FIXATION (ORIF) DISTAL RADIAL FRACTURE Left 12/18/2016   Procedure: OPEN REDUCTION INTERNAL FIXATION (ORIF) DISTAL RADIAL FRACTURE;  Surgeon: Iran Planas, MD;  Location: [facility];  Service: Orthopedics;  Laterality: Left;  . SHOULDER SURGERY  2004, 07/30/10, 01/2011   after her right humeral neck fracture, revision hemiarthroplasty 2004 for persistent pain, revision reverse arthroplasty December 2011 for persistent pain, incision and drainage with poly-exchange 01/26/2011 for possible prosthetic joint infection.  . TONSILLECTOMY AND ADENOIDECTOMY    . TOTAL ABDOMINAL HYSTERECTOMY    . TOTAL HIP ARTHROPLASTY  2004   right  . TOTAL KNEE ARTHROPLASTY  2006   right  . TOTAL SHOULDER REPLACEMENT  2002   right    Social History   Tobacco Use  . Smoking status: Never Smoker  . Smokeless tobacco: Never Used  Substance Use Topics  . Alcohol use: No   family history includes Bladder Cancer in her father; Breast cancer in her unknown relative; Colon polyps in her unknown relative; Depression in her son; Diabetes in her mother; Hypertension in her mother.  Medications: Current Outpatient Medications  Medication Sig Dispense Refill  . albuterol (PROVENTIL HFA;VENTOLIN HFA) 108 (90 Base) MCG/ACT inhaler Inhale 2 puffs into  the lungs every 6 (six) hours as needed for wheezing or shortness of breath. 1 Inhaler 0  . ALPRAZolam (XANAX) 0.5 MG tablet Take 1 tablet (0.5 mg total) by mouth 4 (four) times daily. 120 tablet 3  . AMBULATORY NON FORMULARY MEDICATION [place].  Dx: Rheumatoid Arthritis.  Use daily as needed for ambulation. 1 Units 0  . amLODipine (NORVASC) 10 MG tablet TAKE 1 TABLET BY MOUTH ONCE DAILY 90 tablet 0  . Calcium Carbonate-Vitamin D (CALCIUM 600+D) 600-400 MG-UNIT per tablet Take 1 tablet by mouth 2 (two) times daily.    . cetirizine (ZYRTEC) 10 MG tablet Take 1 tablet (10 mg total) by mouth daily. 30 tablet 11  . clotrimazole-betamethasone (LOTRISONE) cream Apply 1 application topically 2 (two) times daily. 45 g 1  . diclofenac sodium (VOLTAREN) 1 % GEL Apply 4 g topically 4 (four) times daily. To affected joint. 500 g 99  . diphenhydrAMINE (BENADRYL) 25 MG tablet Take 25 mg by mouth every 6 (six) hours as needed. For allergies    . diphenoxylate-atropine (LOMOTIL) 2.5-0.025 MG tablet One to 2 tablets by mouth 4 times a day as needed for diarrhea. 30 tablet 3  . metroNIDAZOLE (METROGEL) 0.75 % gel Apply 1 application topically 2 (two) times daily. 45 g 3  . mirtazapine (REMERON) 30 MG tablet Take 1 tablet (30 mg total) by mouth at bedtime. 90 tablet 1  . montelukast (SINGULAIR) 10 MG tablet Take 1 tablet (10 mg total) by mouth at bedtime. 90 tablet 3  . mupirocin ointment  (BACTROBAN) 2 % Apply to affected area TID for 7 days. 30 g 3  . omeprazole (PRILOSEC) 20 MG capsule TAKE 1 CAPSULE BY MOUTH ONCE DAILY 90 capsule 1  . ondansetron (ZOFRAN ODT) 4 MG disintegrating tablet Take 1 tablet (4 mg total) by mouth every 8 (eight) hours as needed for nausea or vomiting. 20 tablet 12  . Oxycodone HCl 10 MG TABS Take 10mg  po 2-3x per day as needed for pain 70 tablet 0  . polyethylene glycol (MIRALAX / GLYCOLAX) packet Take 17 g by mouth daily. 14 each 0  . promethazine (PHENERGAN) 25 MG tablet Take 1 tablet (25 mg total) by mouth every 6 (six) hours as needed for nausea or vomiting. 30 tablet 2  . simvastatin (ZOCOR) 10 MG tablet TAKE 1 TABLET BY MOUTH ONCE DAILY 90 tablet 0  . triamterene-hydrochlorothiazide (MAXZIDE-25) 37.5-25 MG tablet Take 1 tablet by mouth daily. 90 tablet 3  . TRINTELLIX 5 MG TABS tablet Take 1 tablet (5 mg total) by mouth daily. 30 tablet 12  . zoledronic acid (RECLAST) 5 MG/100ML SOLN Inject 5 mg into the vein. Once yearly. In June    . fluticasone (FLONASE) 50 MCG/ACT nasal spray Place 2 sprays into both nostrils daily. 16 g 2   No current facility-administered medications for this visit.    Allergies  Allergen Reactions  . Bee Venom Anaphylaxis    Has epi-pen  . Ceftin [Cefuroxime Axetil] Anaphylaxis  . Ciprofloxacin Anaphylaxis and Palpitations  . Ephedrine Other (See Comments) and Palpitations    "knocks me out"  . Sulfonamide Derivatives Anaphylaxis  . Telithromycin Anaphylaxis    Reaction: also blurred vision   . Valsartan Anaphylaxis  . Verapamil Anaphylaxis  . Venlafaxine Other (See Comments)    Insomnia, panic  . Beta Adrenergic Blockers     bradycardia  . Cephalosporins     Allergic Reaction  . Clindamycin/Lincomycin     Dry skin and itching/  Cdiff  . Cymbalta [Duloxetine Hcl] Other (See Comments)    Reaction:Confusion and " I fall down"  . Doxazosin     "blurred vision and irritable"  . Gabapentin Other (See Comments)      Reaction: headache  . Hydralazine     HA, Diarrhea  . Lamotrigine     Patient unsure at this time  . Morphine Other (See Comments)    Medication has no effect with pain  . Oxycodone-Acetaminophen Hives    Takes plain oxy IR at home (May 2018)  . Pregabalin Swelling  . Prochlorperazine Edisylate   . Pseudoephedrine Other (See Comments)    Reaction: hyperventilates and blacks out  . Relafen [Nabumetone] Swelling  . Zoloft [Sertraline] Other (See Comments)    "nervous/jittery"  . Doxycycline Hives    Health Maintenance Health Maintenance  Topic Date Due  . INFLUENZA VACCINE  03/01/2018  . TETANUS/TDAP  02/20/2020  . DEXA SCAN  Completed  . PNA vac Low Risk Adult  Completed    Discussed warning signs or symptoms. Please see discharge instructions. Patient expresses understanding.

## 2018-01-16 NOTE — Patient Instructions (Addendum)
Thank you for coming in today. Continue current treatment.  Work on reducing xanax if able.  Recheck as scheduled on June 28th  Ask Di Kindle about [facility] home visit June 24th.

## 2018-01-18 ENCOUNTER — Other Ambulatory Visit: Payer: Self-pay | Admitting: Family Medicine

## 2018-01-22 ENCOUNTER — Other Ambulatory Visit: Payer: Self-pay | Admitting: *Deleted

## 2018-01-22 ENCOUNTER — Encounter: Payer: Self-pay | Admitting: *Deleted

## 2018-01-22 NOTE — Patient Outreach (Signed)
[facility]) Care Management  01/22/2018  [name] March 06, 1933 [id number]   CSW was able to meet with patient to perform a discharge home visit.  Patient admits to feeling less depressed and anxious, as a result of working with CSW and receiving counseling and supportive services.  Patient continues to deny having an interest in receiving Grief and Loss Counseling through [place].  Patient is still tearful when talking about the loss of her dog, Geoffery Spruce, but admits that she is "coming to terms with it".  Patient agrees to continue with deep breathing exercises and relaxation techniques when becoming anxious and/or depressed. CSW will perform a case closure on patient, as all goals of treatment have been met from social work standpoint and no additional social work needs have been identified at this time. CSW will fax an update to patient's Primary Care Physician, Dr. Lynne Leader to ensure that they are aware of CSW's involvement with patient's plan of care.   [facility] CM Care Plan Problem One     Most Recent Value  Care Plan Problem One  Patient is experiencing symptoms of anxiety and depression.  Role Documenting the Problem One  Clinical Social Worker  Care Plan for Problem One  Active  THN Long Term Goal   Patient's symptoms of anxiety and depression will decrease with counseling and supportive services offered by CSW with [facility], within the next three months.  THN Long Term Goal Start Date  12/26/17  [facility]N Long Term Goal Met Date  01/22/18  Interventions for Problem One Long Term Goal  Patient admits to feeling less depressed and anxious after having worked with CSW and after receiving counseling and supportive services.  [facility] CM Short Term Goal #1   Patient will practice deep breathing exercises and relaxation techniques to help cope with symptoms of anxiety and depression, within the next 30 days.  THN CM  Short Term Goal #1 Start Date  12/26/17  [facility] CM Short Term Goal #1 Met Date  01/08/18  [facility] CM Short Term Goal #2   Patient will decide on a counselor/therapist of choice, in [place], [place], within the next 30 days.  THN CM Short Term Goal #2 Start Date  12/26/17  [facility] CM Short Term Goal #2 Met Date  01/08/18  [facility] CM Short Term Goal #3  Patient will review literature provided to her by CSW regarding "Sign's & Symptoms of Depression", within the next two weeks.  THN CM Short Term Goal #3 Start Date  01/01/18  [facility] CM Short Term Goal #3 Met Date  01/15/18    [facility] CM Care Plan Problem Two     Most Recent Value  Care Plan Problem Two  Patient does not have Advanced Directives in place.  Role Documenting the Problem Two  Clinical Social Worker  Care Plan for Problem Two  Active  THN CM Short Term Goal #1   CSW will assist patient with completion of Advanced Directives (Living Will and [place] documents) within the next week.  THN CM Short Term Goal #1 Start Date  01/08/18  [facility] CM Short Term Goal #1 Met Date   01/15/18    [name], [name], [name], [name]  Licensed Clinical Social Worker  [place]  Mailing [place]. [address] [place], [place] [address] Physical Address-[address] [place], [place] [address] Toll Free Main # [phone] Fax #  [phone] Cell # [phone]  Office # [phone] [email or web address]

## 2018-01-25 ENCOUNTER — Other Ambulatory Visit: Payer: Self-pay | Admitting: Family Medicine

## 2018-01-26 ENCOUNTER — Encounter: Payer: Self-pay | Admitting: Family Medicine

## 2018-01-26 ENCOUNTER — Ambulatory Visit (INDEPENDENT_AMBULATORY_CARE_PROVIDER_SITE_OTHER): Payer: Medicare Other | Admitting: Family Medicine

## 2018-01-26 VITALS — BP 142/76 | HR 81 | Ht 62.01 in | Wt 149.0 lb

## 2018-01-26 DIAGNOSIS — F411 Generalized anxiety disorder: Secondary | ICD-10-CM | POA: Diagnosis not present

## 2018-01-26 DIAGNOSIS — F332 Major depressive disorder, recurrent severe without psychotic features: Secondary | ICD-10-CM

## 2018-01-26 DIAGNOSIS — G894 Chronic pain syndrome: Secondary | ICD-10-CM

## 2018-01-26 MED ORDER — ALPRAZOLAM 0.5 MG PO TABS: 0.5000 mg | ORAL_TABLET | Freq: Four times a day (QID) | ORAL | 3 refills | Status: DC

## 2018-01-26 MED ORDER — ONDANSETRON HCL 4 MG PO TABS: 4.0000 mg | ORAL_TABLET | Freq: Three times a day (TID) | ORAL | 12 refills | Status: DC | PRN

## 2018-01-26 NOTE — Progress Notes (Signed)
[name] is a 84 y.o. female who presents to [place]: [place] today for follow-up anxiety, depression and chronic pain.  [name] returns today to discuss her mood symptoms.  As noted previously she has had worsening anxiety and depression symptoms.  Her dog died relatively recently who was a big source of comfort.  She is functionally unable to get a new dog due to household demands.  She takes the medication listed below to manage both depression and anxiety.  She is been using Xanax 4 times daily which has helped.  She takes Trintellix which also has helped significantly her depression symptoms.  She does note however the cost of Trintellix has increased rapidly and her copayment is currently $90 a month which she cannot afford.  She has filled out and will send in the patient assistance form for the manufacturer for Trintellix.  She notes she is quite tearful and irritable at times.  She denies any active SI or HI.  Additionally [name] has chronic pain due to degenerative disc disease of her spine as well as rheumatoid arthritis and history of left wrist fracture.  She takes medication listed below which is a chronic ongoing treatment.  She notes the medications allow her to be more functional and complete tasks at home such as cooking and cleaning.  This allows her to live independently out of the nursing home which she very much wishes to do.  She tolerated the medication well without significant confusion or fatigue or constipation.   ROS as above:  Exam:  BP (!) 142/76   Pulse 81   Ht 5' 2.01" (1.575 m)   Wt 149 lb (67.6 kg)   SpO2 97%   BMI 27.25 kg/m  Gen: Well NAD HEENT: EOMI,  MMM Lungs: Normal work of breathing. CTABL Heart: RRR no MRG Abd: NABS, Soft. Nondistended, Nontender Exts: Brisk capillary refill, warm and well perfused.  Psych alert and oriented  affect is tearful.  Speech and thought process are normal however.  No SI or HI expressed. MSK: Significant thoracic kyphosis.  Hands bilaterally have mild synovitis MCPs.  Patient walks using a [place].  Depression screen [facility] 2/9 01/26/2018 01/16/2018 12/26/2017 12/18/2017 11/24/2017  Decreased Interest 3 3 3 3 1   Down, Depressed, Hopeless 3 2 3 3 2   PHQ - 2 Score 6 5 6 6 3   Altered sleeping 3 2 3 3 1   Tired, decreased energy 3 2 2 2 2   Change in appetite 3 1 3 3  0  Feeling bad or failure about yourself  3 3 2 2 2   Trouble concentrating 2 1 2 2 1   Moving slowly or fidgety/restless 1 0 2 2 0  Suicidal thoughts 1 0 0 0 0  PHQ-9 Score 22 14 20 20 9   Difficult doing work/chores Somewhat difficult Somewhat difficult Somewhat difficult Somewhat difficult Somewhat difficult  Some encounter information is confidential and restricted. Go to Review Flowsheets activity to see all data.  Some recent data might be hidden   GAD 7 : Generalized Anxiety Score 01/26/2018 01/16/2018 11/24/2017 03/13/2017  Nervous, Anxious, on Edge 3 3 2 3   Control/stop worrying 3 3 2 3   Worry too much - different things 3 3 2 3   Trouble relaxing 3 3 2 3   Restless 1 2 2 2   Easily annoyed or irritable 3 3 2 2   Afraid - awful might happen 3 3 2  0  Total GAD 7  Score 19 20 14 16   Anxiety Difficulty Very difficult - Somewhat difficult -  Some encounter information is confidential and restricted. Go to Review Flowsheets activity to see all data.      Assessment and Plan: 84 y.o. female with  Anxiety: Not well controlled.  Aside from counseling and self-care techniques not sure what else there is to do here.  She has had significant different medications and allergies and intolerances and the current regimen seems to be about as good is working to get it at this time.  Plan for watchful waiting and supportive environment.  Recheck in 1 month.  Depression: Again slightly worsening.  Plan to continue current regimen treatment as  above.  Trintellix patient assistance program filled out with patient today and will be faxed this week.  I sent a copy of the form to scan and I will keep a copy myself for 1 month.  Recheck in 1 month.  Chronic pain: Stable at this time.  Continue current regimen.  Cautioned patient with a higher dose of Xanax and opiates.  She is been stable on this regimen for a long time and I feel is reasonably safe.  Recheck monthly.   No orders of the defined types were placed in this encounter.  Meds ordered this encounter  Medications  . ALPRAZolam (XANAX) 0.5 MG tablet    Sig: Take 1 tablet (0.5 mg total) by mouth 4 (four) times daily.    Dispense:  120 tablet    Refill:  3    Up date dose 4x daily  . ondansetron (ZOFRAN) 4 MG tablet    Sig: Take 1 tablet (4 mg total) by mouth every 8 (eight) hours as needed for nausea or vomiting.    Dispense:  20 tablet    Refill:  12    Replaces OTC form     Historical information moved to improve visibility of documentation.  Past Medical History:  Diagnosis Date  . Anxiety   . Arthritis    osteoarthritis  . Asthma   . C. difficile diarrhea   . Candida esophagitis ([place])   . Chronic sinusitis   . Depression   . Depression   . Diverticulosis   . DJD (degenerative joint disease)     L5 compression fracture  . Edema extremities   . Fibromyalgia   . GERD (gastroesophageal reflux disease)   . Gout   . Hyperlipidemia   . Hypertension   . Osteoarthritis   . Osteopenia   . Panic disorder   . Renal failure, unspecified   . Spinal stenosis of lumbar region   . Tubular adenoma of colon   . Wrist fracture, left    Past Surgical History:  Procedure Laterality Date  . ADENOIDECTOMY    . CARPAL TUNNEL RELEASE  2007, 2009   Bilateral  . CATARACT EXTRACTION, BILATERAL    . CHOANAL ADENIODECTOMY    . DIAGNOSTIC LARYNGOSCOPY N/A 11/05/2015   Procedure: DIAGNOSTIC LARYNGOSCOPY AND ESOPHAGOSCOPY;  Surgeon: Rozetta Nunnery, MD;  Location: [facility];   Service: ENT;  Laterality: N/A;  . ganglion cyct removal on fingers     right  . ganglion cyst  wrist    . KNEE ARTHROSCOPY     right  . NASAL SINUS SURGERY    . OPEN REDUCTION INTERNAL FIXATION (ORIF) DISTAL RADIAL FRACTURE Left 12/18/2016   Procedure: OPEN REDUCTION INTERNAL FIXATION (ORIF) DISTAL RADIAL FRACTURE;  Surgeon: Iran Planas, MD;  Location: [facility];  Service: Orthopedics;  Laterality: Left;  . SHOULDER SURGERY  2004, 07/30/10, 01/2011   after her right humeral neck fracture, revision hemiarthroplasty 2004 for persistent pain, revision reverse arthroplasty December 2011 for persistent pain, incision and drainage with poly-exchange 01/26/2011 for possible prosthetic joint infection.  . TONSILLECTOMY AND ADENOIDECTOMY    . TOTAL ABDOMINAL HYSTERECTOMY    . TOTAL HIP ARTHROPLASTY  2004   right  . TOTAL KNEE ARTHROPLASTY  2006   right  . TOTAL SHOULDER REPLACEMENT  2002   right   Social History   Tobacco Use  . Smoking status: Never Smoker  . Smokeless tobacco: Never Used  Substance Use Topics  . Alcohol use: No   family history includes Bladder Cancer in her father; Breast cancer in her unknown relative; Colon polyps in her unknown relative; Depression in her son; Diabetes in her mother; Hypertension in her mother.  Medications: Current Outpatient Medications  Medication Sig Dispense Refill  . albuterol (PROVENTIL HFA;VENTOLIN HFA) 108 (90 Base) MCG/ACT inhaler Inhale 2 puffs into the lungs every 6 (six) hours as needed for wheezing or shortness of breath. 1 Inhaler 0  . ALPRAZolam (XANAX) 0.5 MG tablet Take 1 tablet (0.5 mg total) by mouth 4 (four) times daily. 120 tablet 3  . AMBULATORY NON FORMULARY MEDICATION [place].  Dx: Rheumatoid Arthritis.  Use daily as needed for ambulation. 1 Units 0  . amLODipine (NORVASC) 10 MG tablet TAKE 1 TABLET BY MOUTH ONCE DAILY 90 tablet 0  . Calcium Carbonate-Vitamin D (CALCIUM 600+D) 600-400 MG-UNIT per tablet Take 1 tablet by  mouth 2 (two) times daily.    . cetirizine (ZYRTEC) 10 MG tablet Take 1 tablet (10 mg total) by mouth daily. 30 tablet 11  . clotrimazole-betamethasone (LOTRISONE) cream Apply 1 application topically 2 (two) times daily. 45 g 1  . diclofenac sodium (VOLTAREN) 1 % GEL Apply 4 g topically 4 (four) times daily. To affected joint. 500 g 99  . diphenhydrAMINE (BENADRYL) 25 MG tablet Take 25 mg by mouth every 6 (six) hours as needed. For allergies    . diphenoxylate-atropine (LOMOTIL) 2.5-0.025 MG tablet One to 2 tablets by mouth 4 times a day as needed for diarrhea. 30 tablet 3  . fluticasone (FLONASE) 50 MCG/ACT nasal spray Place 2 sprays into both nostrils daily. 16 g 2  . metroNIDAZOLE (METROGEL) 0.75 % gel Apply 1 application topically 2 (two) times daily. 45 g 3  . mirtazapine (REMERON) 30 MG tablet TAKE 1 TABLET BY MOUTH AT BEDTIME 90 tablet 1  . montelukast (SINGULAIR) 10 MG tablet TAKE 1 TABLET BY MOUTH AT BEDTIME 90 tablet 3  . mupirocin ointment (BACTROBAN) 2 % Apply to affected area TID for 7 days. 30 g 3  . omeprazole (PRILOSEC) 20 MG capsule TAKE 1 CAPSULE BY MOUTH ONCE DAILY 90 capsule 1  . ondansetron (ZOFRAN) 4 MG tablet Take 1 tablet (4 mg total) by mouth every 8 (eight) hours as needed for nausea or vomiting. 20 tablet 12  . Oxycodone HCl 10 MG TABS Take 10mg  po 2-3x per day as needed for pain 70 tablet 0  . polyethylene glycol (MIRALAX / GLYCOLAX) packet Take 17 g by mouth daily. 14 each 0  . promethazine (PHENERGAN) 25 MG tablet Take 1 tablet (25 mg total) by mouth every 6 (six) hours as needed for nausea or vomiting. 30 tablet 2  . simvastatin (ZOCOR) 10 MG tablet TAKE 1 TABLET BY MOUTH ONCE DAILY 90 tablet 0  . triamterene-hydrochlorothiazide (MAXZIDE-25) 37.5-25 MG  tablet Take 1 tablet by mouth daily. 90 tablet 3  . TRINTELLIX 5 MG TABS tablet Take 1 tablet (5 mg total) by mouth daily. 30 tablet 12  . zoledronic acid (RECLAST) 5 MG/100ML SOLN Inject 5 mg into the vein. Once  yearly. In June     No current facility-administered medications for this visit.    Allergies  Allergen Reactions  . Bee Venom Anaphylaxis    Has epi-pen  . Ceftin [Cefuroxime Axetil] Anaphylaxis  . Ciprofloxacin Anaphylaxis and Palpitations  . Ephedrine Other (See Comments) and Palpitations    "knocks me out"  . Sulfonamide Derivatives Anaphylaxis  . Telithromycin Anaphylaxis    Reaction: also blurred vision   . Valsartan Anaphylaxis  . Verapamil Anaphylaxis  . Venlafaxine Other (See Comments)    Insomnia, panic  . Beta Adrenergic Blockers     bradycardia  . Cephalosporins     Allergic Reaction  . Clindamycin/Lincomycin     Dry skin and itching/ Cdiff  . Cymbalta [Duloxetine Hcl] Other (See Comments)    Reaction:Confusion and " I fall down"  . Doxazosin     "blurred vision and irritable"  . Gabapentin Other (See Comments)    Reaction: headache  . Hydralazine     HA, Diarrhea  . Lamotrigine     Patient unsure at this time  . Morphine Other (See Comments)    Medication has no effect with pain  . Oxycodone-Acetaminophen Hives    Takes plain oxy IR at home (May 2018)  . Pregabalin Swelling  . Prochlorperazine Edisylate   . Pseudoephedrine Other (See Comments)    Reaction: hyperventilates and blacks out  . Relafen [Nabumetone] Swelling  . Zoloft [Sertraline] Other (See Comments)    "nervous/jittery"  . Doxycycline Hives     Discussed warning signs or symptoms. Please see discharge instructions. Patient expresses understanding.

## 2018-01-26 NOTE — Patient Instructions (Signed)
Thank you for coming in today. I will send off form I will also send in Xanax Recheck with me in 1 month.  Return sooner if needed.

## 2018-01-30 ENCOUNTER — Telehealth: Payer: Self-pay | Admitting: Family Medicine

## 2018-01-30 NOTE — Telephone Encounter (Signed)
Dr. Georgina Snell brought me the paperwork and patient brought in last page she had that was signed and I faxed all paperwork to drug company. Gave paperwork back to Dr. Georgina Snell for him to keep. KG LPN

## 2018-01-30 NOTE — Telephone Encounter (Signed)
Patient calls and states that the medication assistance forms sent from [employer] that were faxed on Friday was missing page 6 which had her signature on it and the company will not send her the medication and she needs this med. Do you have these forms so can be refaxed ? KG LPN

## 2018-01-30 NOTE — Telephone Encounter (Signed)
That page was just not in my packet to do. I went online and printed that page and I have that page for the patient to sign. I kept a copy of the original form to re-fax.  Please inform pt.

## 2018-01-31 NOTE — Telephone Encounter (Signed)
I called the [phone] to try and get samples to fill the gap from the delay of shipment.

## 2018-01-31 NOTE — Telephone Encounter (Signed)
Help at hand. PAP was approved until 07/31/18.

## 2018-01-31 NOTE — Telephone Encounter (Signed)
Please see note from Dr. Georgina Snell response. Rhonda Cunningham,CMA

## 2018-02-05 ENCOUNTER — Telehealth: Payer: Self-pay

## 2018-02-05 NOTE — Telephone Encounter (Signed)
I spoke with [name] and she will not receive the medication of Trintellix for a least another week.

## 2018-02-05 NOTE — Telephone Encounter (Signed)
We have a 4 week supply of Trintellix at by desk for pick up anytime as needed.

## 2018-02-06 ENCOUNTER — Telehealth: Payer: Self-pay | Admitting: Family Medicine

## 2018-02-06 NOTE — Telephone Encounter (Signed)
Received fax from [employer] that Zofran requires a PA. Information has been sent to the insurance company. Awaiting determination.

## 2018-02-06 NOTE — Telephone Encounter (Signed)
[name] is aware and will have someone come by to pick up.

## 2018-02-06 NOTE — Telephone Encounter (Signed)
Approvedtoday  Effective from 02/06/2018 through 02/07/2019. Pharmacy notified.

## 2018-02-07 ENCOUNTER — Other Ambulatory Visit: Payer: Self-pay | Admitting: Family Medicine

## 2018-02-14 ENCOUNTER — Ambulatory Visit: Payer: Self-pay | Admitting: Family Medicine

## 2018-02-14 ENCOUNTER — Encounter: Payer: Self-pay | Admitting: Family Medicine

## 2018-02-14 ENCOUNTER — Ambulatory Visit (INDEPENDENT_AMBULATORY_CARE_PROVIDER_SITE_OTHER): Payer: Medicare Other | Admitting: Family Medicine

## 2018-02-14 VITALS — BP 130/69 | HR 78 | Temp 97.5°F | Wt 150.0 lb

## 2018-02-14 DIAGNOSIS — F332 Major depressive disorder, recurrent severe without psychotic features: Secondary | ICD-10-CM

## 2018-02-14 DIAGNOSIS — F411 Generalized anxiety disorder: Secondary | ICD-10-CM | POA: Diagnosis not present

## 2018-02-14 DIAGNOSIS — I10 Essential (primary) hypertension: Secondary | ICD-10-CM | POA: Diagnosis not present

## 2018-02-14 DIAGNOSIS — M545 Low back pain, unspecified: Secondary | ICD-10-CM

## 2018-02-14 DIAGNOSIS — G894 Chronic pain syndrome: Secondary | ICD-10-CM

## 2018-02-14 MED ORDER — OXYCODONE HCL 10 MG PO TABS: ORAL_TABLET | ORAL | 0 refills | Status: DC

## 2018-02-14 NOTE — Patient Instructions (Signed)
Thank you for coming in today. Recheck as scheduled on Aug 12th.  Return sooner if needed.

## 2018-02-14 NOTE — Progress Notes (Signed)
[name] is a 84 y.o. female who presents to [place]: [place] today for anxiety and depression chronic pain hypertension.  [name] has been seen multiple times for anxiety and depression.  This is been moderately controlled with medications listed below.  She was without Trintellix recently due to medicine cost issue which has since been resolved.  She notes the medication does help to control her symptoms.  She continues to have significant anxiety and depression but is slightly better today.  She notes her family is a significant stressor to her.  Pain: [name] continues to do reasonably well with chronic pain.  She takes oxycodone regularly.  In the past she was using fentanyl patches.  She brings in 9 phenyl patches that she would like to destroy as she is no longer using them.  She tolerates the oxycodone well and notes that allows her to function and live independently.  Hypertension: [name] takes medications listed below.  She notes she had a medication mixup and missed her evening dose yesterday.  She denies chest pain palpitations or shortness of breath.   ROS as above:  Exam:  BP 130/69   Pulse 78   Temp (!) 97.5 F (36.4 C) (Oral)   Wt 150 lb (68 kg)   BMI 27.43 kg/m  Gen: Well NAD HEENT: EOMI,  MMM Lungs: Normal work of breathing. CTABL Heart: RRR no MRG Abd: NABS, Soft. Nondistended, Nontender Exts: Brisk capillary refill, warm and well perfused.  MSK: Significant thoracic kyphosis.  Tender to palpation across her trapezius and low back.  Pain in shoulders and upper extremities also present with palpation.  Patient walks using a [place] [employer] alert and oriented normal speech thought process.  Affect is tearful.  No active SI or HI expressed.  Depression screen [facility] 2/9 02/14/2018 01/26/2018 01/16/2018 12/26/2017 12/18/2017  Decreased Interest 3 3 3 3 3     Down, Depressed, Hopeless 3 3 2 3 3   PHQ - 2 Score 6 6 5 6 6   Altered sleeping 2 3 2 3 3   Tired, decreased energy 2 3 2 2 2   Change in appetite 2 3 1 3 3   Feeling bad or failure about yourself  2 3 3 2 2   Trouble concentrating 1 2 1 2 2   Moving slowly or fidgety/restless 0 1 0 2 2  Suicidal thoughts 0 1 0 0 0  PHQ-9 Score 15 22 14 20 20   Difficult doing work/chores Somewhat difficult Somewhat difficult Somewhat difficult Somewhat difficult Somewhat difficult  Some encounter information is confidential and restricted. Go to Review Flowsheets activity to see all data.  Some recent data might be hidden   GAD 7 : Generalized Anxiety Score 02/14/2018 01/26/2018 01/16/2018 11/24/2017  Nervous, Anxious, on Edge 3 3 3 2   Control/stop worrying 3 3 3 2   Worry too much - different things 3 3 3 2   Trouble relaxing 3 3 3 2   Restless 1 1 2 2   Easily annoyed or irritable 2 3 3 2   Afraid - awful might happen 3 3 3 2   Total GAD 7 Score 18 19 20 14   Anxiety Difficulty Somewhat difficult Very difficult - Somewhat difficult  Some encounter information is confidential and restricted. Go to Review Flowsheets activity to see all data.      Assessment and Plan: 84 y.o. female with  Anxiety and depression: Slight improvement.  Continues to be quite bothersome.  Patient would like to  avoid medication change at this time.  Return for recheck as scheduled on August 12.  Discussed coping mechanisms and strategies as well.  Chronic pain: Doing reasonably well.  Oxycodone refilled.  Fentanyl patches received and destroyed.  Hypertension: Blood pressure reasonably controlled today.  Continue current regimen recheck on the 12th.   No orders of the defined types were placed in this encounter.  Meds ordered this encounter  Medications  . Oxycodone HCl 10 MG TABS    Sig: Take 10mg  po 2-3x per day as needed for pain    Dispense:  70 tablet    Refill:  0     Historical information moved to improve  visibility of documentation.  Past Medical History:  Diagnosis Date  . Anxiety   . Arthritis    osteoarthritis  . Asthma   . C. difficile diarrhea   . Candida esophagitis ([place])   . Chronic sinusitis   . Depression   . Depression   . Diverticulosis   . DJD (degenerative joint disease)     L5 compression fracture  . Edema extremities   . Fibromyalgia   . GERD (gastroesophageal reflux disease)   . Gout   . Hyperlipidemia   . Hypertension   . Osteoarthritis   . Osteopenia   . Panic disorder   . Renal failure, unspecified   . Spinal stenosis of lumbar region   . Tubular adenoma of colon   . Wrist fracture, left    Past Surgical History:  Procedure Laterality Date  . ADENOIDECTOMY    . CARPAL TUNNEL RELEASE  2007, 2009   Bilateral  . CATARACT EXTRACTION, BILATERAL    . CHOANAL ADENIODECTOMY    . DIAGNOSTIC LARYNGOSCOPY N/A 11/05/2015   Procedure: DIAGNOSTIC LARYNGOSCOPY AND ESOPHAGOSCOPY;  Surgeon: Rozetta Nunnery, MD;  Location: [facility];  Service: ENT;  Laterality: N/A;  . ganglion cyct removal on fingers     right  . ganglion cyst  wrist    . KNEE ARTHROSCOPY     right  . NASAL SINUS SURGERY    . OPEN REDUCTION INTERNAL FIXATION (ORIF) DISTAL RADIAL FRACTURE Left 12/18/2016   Procedure: OPEN REDUCTION INTERNAL FIXATION (ORIF) DISTAL RADIAL FRACTURE;  Surgeon: Iran Planas, MD;  Location: [place];  Service: Orthopedics;  Laterality: Left;  . SHOULDER SURGERY  2004, 07/30/10, 01/2011   after her right humeral neck fracture, revision hemiarthroplasty 2004 for persistent pain, revision reverse arthroplasty December 2011 for persistent pain, incision and drainage with poly-exchange 01/26/2011 for possible prosthetic joint infection.  . TONSILLECTOMY AND ADENOIDECTOMY    . TOTAL ABDOMINAL HYSTERECTOMY    . TOTAL HIP ARTHROPLASTY  2004   right  . TOTAL KNEE ARTHROPLASTY  2006   right  . TOTAL SHOULDER REPLACEMENT  2002   right   Social History   Tobacco Use  . Smoking  status: Never Smoker  . Smokeless tobacco: Never Used  Substance Use Topics  . Alcohol use: No   family history includes Bladder Cancer in her father; Breast cancer in her unknown relative; Colon polyps in her unknown relative; Depression in her son; Diabetes in her mother; Hypertension in her mother.  Medications: Current Outpatient Medications  Medication Sig Dispense Refill  . albuterol (PROVENTIL HFA;VENTOLIN HFA) 108 (90 Base) MCG/ACT inhaler Inhale 2 puffs into the lungs every 6 (six) hours as needed for wheezing or shortness of breath. 1 Inhaler 0  . ALPRAZolam (XANAX) 0.5 MG tablet Take 1 tablet (0.5 mg total) by mouth 4 (  four) times daily. 120 tablet 3  . AMBULATORY NON FORMULARY MEDICATION [place].  Dx: Rheumatoid Arthritis.  Use daily as needed for ambulation. 1 Units 0  . amLODipine (NORVASC) 10 MG tablet TAKE 1 TABLET BY MOUTH ONCE DAILY 90 tablet 0  . Calcium Carbonate-Vitamin D (CALCIUM 600+D) 600-400 MG-UNIT per tablet Take 1 tablet by mouth 2 (two) times daily.    . cetirizine (ZYRTEC) 10 MG tablet Take 1 tablet (10 mg total) by mouth daily. 30 tablet 11  . clotrimazole-betamethasone (LOTRISONE) cream Apply 1 application topically 2 (two) times daily. 45 g 1  . diclofenac sodium (VOLTAREN) 1 % GEL Apply 4 g topically 4 (four) times daily. To affected joint. 500 g 99  . diphenhydrAMINE (BENADRYL) 25 MG tablet Take 25 mg by mouth every 6 (six) hours as needed. For allergies    . diphenoxylate-atropine (LOMOTIL) 2.5-0.025 MG tablet One to 2 tablets by mouth 4 times a day as needed for diarrhea. 30 tablet 3  . fluticasone (FLONASE) 50 MCG/ACT nasal spray Place 2 sprays into both nostrils daily. 16 g 2  . metroNIDAZOLE (METROGEL) 0.75 % gel Apply 1 application topically 2 (two) times daily. 45 g 3  . mirtazapine (REMERON) 30 MG tablet TAKE 1 TABLET BY MOUTH AT BEDTIME 90 tablet 1  . montelukast (SINGULAIR) 10 MG tablet TAKE 1 TABLET BY MOUTH AT BEDTIME 90 tablet 3  .  mupirocin ointment (BACTROBAN) 2 % Apply to affected area TID for 7 days. 30 g 3  . omeprazole (PRILOSEC) 20 MG capsule TAKE 1 CAPSULE BY MOUTH ONCE DAILY 90 capsule 1  . ondansetron (ZOFRAN) 4 MG tablet Take 1 tablet (4 mg total) by mouth every 8 (eight) hours as needed for nausea or vomiting. 20 tablet 12  . Oxycodone HCl 10 MG TABS Take 10mg  po 2-3x per day as needed for pain 70 tablet 0  . polyethylene glycol (MIRALAX / GLYCOLAX) packet Take 17 g by mouth daily. 14 each 0  . promethazine (PHENERGAN) 25 MG tablet Take 1 tablet (25 mg total) by mouth every 6 (six) hours as needed for nausea or vomiting. 30 tablet 2  . simvastatin (ZOCOR) 10 MG tablet TAKE 1 TABLET BY MOUTH ONCE DAILY 90 tablet 0  . triamterene-hydrochlorothiazide (MAXZIDE-25) 37.5-25 MG tablet Take 1 tablet by mouth daily. 90 tablet 3  . TRINTELLIX 5 MG TABS tablet Take 1 tablet (5 mg total) by mouth daily. 30 tablet 12  . zoledronic acid (RECLAST) 5 MG/100ML SOLN Inject 5 mg into the vein. Once yearly. In June     No current facility-administered medications for this visit.    Allergies  Allergen Reactions  . Bee Venom Anaphylaxis    Has epi-pen  . Ceftin [Cefuroxime Axetil] Anaphylaxis  . Ciprofloxacin Anaphylaxis and Palpitations  . Ephedrine Other (See Comments) and Palpitations    "knocks me out"  . Sulfonamide Derivatives Anaphylaxis  . Telithromycin Anaphylaxis    Reaction: also blurred vision   . Valsartan Anaphylaxis  . Verapamil Anaphylaxis  . Venlafaxine Other (See Comments)    Insomnia, panic  . Beta Adrenergic Blockers     bradycardia  . Cephalosporins     Allergic Reaction  . Clindamycin/Lincomycin     Dry skin and itching/ Cdiff  . Cymbalta [Duloxetine Hcl] Other (See Comments)    Reaction:Confusion and " I fall down"  . Doxazosin     "blurred vision and irritable"  . Gabapentin Other (See Comments)    Reaction: headache  .  Hydralazine     HA, Diarrhea  . Lamotrigine     Patient unsure at  this time  . Morphine Other (See Comments)    Medication has no effect with pain  . Oxycodone-Acetaminophen Hives    Takes plain oxy IR at home (May 2018)  . Pregabalin Swelling  . Prochlorperazine Edisylate   . Pseudoephedrine Other (See Comments)    Reaction: hyperventilates and blacks out  . Relafen [Nabumetone] Swelling  . Zoloft [Sertraline] Other (See Comments)    "nervous/jittery"  . Doxycycline Hives     Discussed warning signs or symptoms. Please see discharge instructions. Patient expresses understanding.  Patient researched [facility] Controlled Substance Reporting System.

## 2018-02-20 ENCOUNTER — Telehealth: Payer: Self-pay | Admitting: Family Medicine

## 2018-02-20 DIAGNOSIS — M069 Rheumatoid arthritis, unspecified: Secondary | ICD-10-CM

## 2018-02-20 NOTE — Telephone Encounter (Signed)
Patient calls and was supposed to have her reclast infusion done in June.  Would like to have this done in [place] as she is unable to get to [facility] anymore.  Needs order entered for Reclast infusion. Please let patient know if can be done in [place]. KG LPN

## 2018-02-22 MED ORDER — AMBULATORY NON FORMULARY MEDICATION: 0 refills | Status: DC

## 2018-02-22 NOTE — Telephone Encounter (Signed)
I called [facility] at [phone] to inquire if needed a certain form to fax for the Reclast infusion and they do not.  Required is a prescription with patient name, DOB and address with drug to be infused, MD signature and diagnosis. Faxed prescription to [phone] for them to contact patient and schedule for infusion. Called patient and notified her they will be calling her to schdule her infusion. KG LPN

## 2018-02-22 NOTE — Telephone Encounter (Signed)
I think we are probably going to have to call around to the various infusion clinics around [place] to get this done.  I am sure there is going to be an order form that I have to fill out.  In the meantime I have printed an order that we can do now

## 2018-03-03 ENCOUNTER — Other Ambulatory Visit: Payer: Self-pay | Admitting: Family Medicine

## 2018-03-12 ENCOUNTER — Encounter: Payer: Self-pay | Admitting: Family Medicine

## 2018-03-12 ENCOUNTER — Ambulatory Visit (INDEPENDENT_AMBULATORY_CARE_PROVIDER_SITE_OTHER): Payer: Medicare Other | Admitting: Family Medicine

## 2018-03-12 VITALS — BP 136/73 | HR 91 | Wt 155.0 lb

## 2018-03-12 DIAGNOSIS — G894 Chronic pain syndrome: Secondary | ICD-10-CM | POA: Diagnosis not present

## 2018-03-12 DIAGNOSIS — M153 Secondary multiple arthritis: Secondary | ICD-10-CM

## 2018-03-12 DIAGNOSIS — F332 Major depressive disorder, recurrent severe without psychotic features: Secondary | ICD-10-CM

## 2018-03-12 DIAGNOSIS — I10 Essential (primary) hypertension: Secondary | ICD-10-CM

## 2018-03-12 DIAGNOSIS — N183 Chronic kidney disease, stage 3 unspecified: Secondary | ICD-10-CM

## 2018-03-12 DIAGNOSIS — M810 Age-related osteoporosis without current pathological fracture: Secondary | ICD-10-CM | POA: Diagnosis not present

## 2018-03-12 DIAGNOSIS — M069 Rheumatoid arthritis, unspecified: Secondary | ICD-10-CM | POA: Diagnosis not present

## 2018-03-12 DIAGNOSIS — F411 Generalized anxiety disorder: Secondary | ICD-10-CM

## 2018-03-12 MED ORDER — OXYCODONE HCL 10 MG PO TABS: ORAL_TABLET | ORAL | 0 refills | Status: DC

## 2018-03-12 MED ORDER — BUPROPION HCL ER (XL) 150 MG PO TB24: 150.0000 mg | ORAL_TABLET | Freq: Every day | ORAL | 1 refills | Status: DC

## 2018-03-12 NOTE — Progress Notes (Signed)
[name] is a 84 y.o. female who presents to [place]: [place] today for follow-up anxiety and depression, chronic pain and chronic kidney disease.  [name] has long-term ongoing anxiety and depression.  This is been moderately controlled with Trintellix 5 mg and mirtazapine 30 mg daily in addition to Xanax intermittently.  Her symptoms have been worsening recently after the loss of her dog.  She notes her depression symptoms are worsening and she is having trouble coping at home.  She is worried about her husband's worsening health and her future.  She notes she is tearful and cries at times.  She denies any SI or HI.  Chronic pain: [name] has ongoing chronic pain due to severe spinal DDD and rheumatoid arthritis.  This is managed with oxycodone intermittently.  She tolerates the medication well and notes that allows her to function at home completing activities of daily living.  She tolerates this medication well without severe side effects such as constipation or lightheadedness or dizziness.  She takes this in addition to Xanax and understands that she is at increased risk.  In the past was able to wean off of phenyl patches as well.  Chronic kidney disease: [name] has a history of chronic kidney disease which has been typically well controlled.  She takes blood pressure medications listed below and feels well with no change in her urinary output or swelling.   ROS as above:  Exam:  BP 136/73   Pulse 91   Wt 155 lb (70.3 kg)   BMI 28.34 kg/m  Gen: Well NAD HEENT: EOMI,  MMM Lungs: Normal work of breathing. CTABL Heart: RRR no MRG Abd: NABS, Soft. Nondistended, Nontender Exts: Brisk capillary refill, warm and well perfused.  MSK: Significant thoracic kyphosis.  Ulnar deviation hands bilaterally with MCP synovitis present. Psych: Alert and oriented tearful affect  no SI or HI.  Depression screen [facility] 2/9 03/12/2018 02/14/2018 01/26/2018 01/16/2018 12/26/2017  Decreased Interest 3 3 3 3 3   Down, Depressed, Hopeless 3 3 3 2 3   PHQ - 2 Score 6 6 6 5 6   Altered sleeping 3 2 3 2 3   Tired, decreased energy 3 2 3 2 2   Change in appetite 2 2 3 1 3   Feeling bad or failure about yourself  3 2 3 3 2   Trouble concentrating 3 1 2 1 2   Moving slowly or fidgety/restless - 0 1 0 2  Suicidal thoughts 0 0 1 0 0  PHQ-9 Score 20 15 22 14 20   Difficult doing work/chores Somewhat difficult Somewhat difficult Somewhat difficult Somewhat difficult Somewhat difficult  Some encounter information is confidential and restricted. Go to Review Flowsheets activity to see all data.  Some recent data might be hidden   GAD 7 : Generalized Anxiety Score 03/12/2018 02/14/2018 01/26/2018 01/16/2018  Nervous, Anxious, on Edge 3 3 3 3   Control/stop worrying 3 3 3 3   Worry too much - different things 3 3 3 3   Trouble relaxing 3 3 3 3   Restless 0 1 1 2   Easily annoyed or irritable 3 2 3 3   Afraid - awful might happen 3 3 3 3   Total GAD 7 Score 18 18 19 20   Anxiety Difficulty Somewhat difficult Somewhat difficult Very difficult -  Some encounter information is confidential and restricted. Go to Review Flowsheets activity to see all data.      Lab and Radiology Results   Chemistry  Component Value Date/Time   NA 137 10/10/2017 1131   NA 141 10/02/2013   K 4.3 10/10/2017 1131   CL 95 (L) 10/10/2017 1131   CO2 31 10/10/2017 1131   BUN 16 10/10/2017 1131   BUN 10 10/02/2013   CREATININE 1.19 (H) 10/10/2017 1131   GLU 104 10/02/2013      Component Value Date/Time   CALCIUM 10.1 10/10/2017 1131   CALCIUM 9.8 10/02/2013   ALKPHOS 84 01/25/2016 1447   AST 24 10/10/2017 1131   ALT 15 10/10/2017 1131   BILITOT 0.6 10/10/2017 1131        Assessment and Plan: 84 y.o. female with  Mood: Worsening anxiety and depression.  Plan to continue Trintellix and mirtazapine.  Add  bupropion XL 150 mg daily and recheck in 1 month  Chronic pain: Doing reasonably well continue current regimen.  Patient and I understand risk of concomitant benzodiazepine and opiate use.  Chronic kidney disease and hypertension: Reasonably well controlled.  Recheck basic labs listed below.  Return in 1 month.  Patient researched [facility] Controlled Substance Reporting System.    Orders Placed This Encounter  Procedures  . COMPLETE METABOLIC PANEL WITH GFR  . CBC  . Magnesium   Meds ordered this encounter  Medications  . buPROPion (WELLBUTRIN XL) 150 MG 24 hr tablet    Sig: Take 1 tablet (150 mg total) by mouth daily.    Dispense:  90 tablet    Refill:  1  . Oxycodone HCl 10 MG TABS    Sig: Take 10mg  po 2-3x per day as needed for pain    Dispense:  70 tablet    Refill:  0     Historical information moved to improve visibility of documentation.  Past Medical History:  Diagnosis Date  . Anxiety   . Arthritis    osteoarthritis  . Asthma   . C. difficile diarrhea   . Candida esophagitis ([place])   . Chronic sinusitis   . Depression   . Depression   . Diverticulosis   . DJD (degenerative joint disease)     L5 compression fracture  . Edema extremities   . Fibromyalgia   . GERD (gastroesophageal reflux disease)   . Gout   . Hyperlipidemia   . Hypertension   . Osteoarthritis   . Osteopenia   . Panic disorder   . Renal failure, unspecified   . Spinal stenosis of lumbar region   . Tubular adenoma of colon   . Wrist fracture, left    Past Surgical History:  Procedure Laterality Date  . ADENOIDECTOMY    . CARPAL TUNNEL RELEASE  2007, 2009   Bilateral  . CATARACT EXTRACTION, BILATERAL    . CHOANAL ADENIODECTOMY    . DIAGNOSTIC LARYNGOSCOPY N/A 11/05/2015   Procedure: DIAGNOSTIC LARYNGOSCOPY AND ESOPHAGOSCOPY;  Surgeon: Rozetta Nunnery, MD;  Location: [facility];  Service: ENT;  Laterality: N/A;  . ganglion cyct removal on fingers     right  . ganglion cyst   wrist    . KNEE ARTHROSCOPY     right  . NASAL SINUS SURGERY    . OPEN REDUCTION INTERNAL FIXATION (ORIF) DISTAL RADIAL FRACTURE Left 12/18/2016   Procedure: OPEN REDUCTION INTERNAL FIXATION (ORIF) DISTAL RADIAL FRACTURE;  Surgeon: Iran Planas, MD;  Location: [place];  Service: Orthopedics;  Laterality: Left;  . SHOULDER SURGERY  2004, 07/30/10, 01/2011   after her right humeral neck fracture, revision hemiarthroplasty 2004 for persistent pain, revision reverse arthroplasty  December 2011 for persistent pain, incision and drainage with poly-exchange 01/26/2011 for possible prosthetic joint infection.  . TONSILLECTOMY AND ADENOIDECTOMY    . TOTAL ABDOMINAL HYSTERECTOMY    . TOTAL HIP ARTHROPLASTY  2004   right  . TOTAL KNEE ARTHROPLASTY  2006   right  . TOTAL SHOULDER REPLACEMENT  2002   right   Social History   Tobacco Use  . Smoking status: Never Smoker  . Smokeless tobacco: Never Used  Substance Use Topics  . Alcohol use: No   family history includes Bladder Cancer in her father; Breast cancer in her unknown relative; Colon polyps in her unknown relative; Depression in her son; Diabetes in her mother; Hypertension in her mother.  Medications: Current Outpatient Medications  Medication Sig Dispense Refill  . albuterol (PROVENTIL HFA;VENTOLIN HFA) 108 (90 Base) MCG/ACT inhaler Inhale 2 puffs into the lungs every 6 (six) hours as needed for wheezing or shortness of breath. 1 Inhaler 0  . ALPRAZolam (XANAX) 0.5 MG tablet Take 1 tablet (0.5 mg total) by mouth 4 (four) times daily. 120 tablet 3  . AMBULATORY NON FORMULARY MEDICATION Reclast infusion per protocol at infusion clinic in [place]. 1 Units 0  . amLODipine (NORVASC) 10 MG tablet TAKE 1 TABLET BY MOUTH ONCE DAILY 90 tablet 0  . Calcium Carbonate-Vitamin D (CALCIUM 600+D) 600-400 MG-UNIT per tablet Take 1 tablet by mouth 2 (two) times daily.    . cetirizine (ZYRTEC) 10 MG tablet Take 1 tablet (10 mg total) by mouth daily. 30  tablet 11  . clotrimazole-betamethasone (LOTRISONE) cream Apply 1 application topically 2 (two) times daily. 45 g 1  . diclofenac sodium (VOLTAREN) 1 % GEL Apply 4 g topically 4 (four) times daily. To affected joint. 500 g 99  . diphenhydrAMINE (BENADRYL) 25 MG tablet Take 25 mg by mouth every 6 (six) hours as needed. For allergies    . diphenoxylate-atropine (LOMOTIL) 2.5-0.025 MG tablet One to 2 tablets by mouth 4 times a day as needed for diarrhea. 30 tablet 3  . fluticasone (FLONASE) 50 MCG/ACT nasal spray Place 2 sprays into both nostrils daily. 16 g 2  . metroNIDAZOLE (METROGEL) 0.75 % gel Apply 1 application topically 2 (two) times daily. 45 g 3  . mirtazapine (REMERON) 30 MG tablet TAKE 1 TABLET BY MOUTH AT BEDTIME 90 tablet 1  . montelukast (SINGULAIR) 10 MG tablet TAKE 1 TABLET BY MOUTH AT BEDTIME 90 tablet 3  . mupirocin ointment (BACTROBAN) 2 % Apply to affected area TID for 7 days. 30 g 3  . omeprazole (PRILOSEC) 20 MG capsule TAKE 1 CAPSULE BY MOUTH ONCE DAILY 90 capsule 1  . ondansetron (ZOFRAN) 4 MG tablet Take 1 tablet (4 mg total) by mouth every 8 (eight) hours as needed for nausea or vomiting. 20 tablet 12  . [START ON 03/17/2018] Oxycodone HCl 10 MG TABS Take 10mg  po 2-3x per day as needed for pain 70 tablet 0  . polyethylene glycol (MIRALAX / GLYCOLAX) packet Take 17 g by mouth daily. 14 each 0  . promethazine (PHENERGAN) 25 MG tablet Take 1 tablet (25 mg total) by mouth every 6 (six) hours as needed for nausea or vomiting. 30 tablet 2  . simvastatin (ZOCOR) 10 MG tablet TAKE 1 TABLET BY MOUTH ONCE DAILY 90 tablet 0  . triamterene-hydrochlorothiazide (MAXZIDE-25) 37.5-25 MG tablet Take 1 tablet by mouth daily. 90 tablet 3  . TRINTELLIX 5 MG TABS tablet Take 1 tablet (5 mg total) by mouth daily. 30 tablet  12  . zoledronic acid (RECLAST) 5 MG/100ML SOLN Inject 5 mg into the vein. Once yearly. In June    . buPROPion (WELLBUTRIN XL) 150 MG 24 hr tablet Take 1 tablet (150 mg total)  by mouth daily. 90 tablet 1   No current facility-administered medications for this visit.    Allergies  Allergen Reactions  . Bee Venom Anaphylaxis    Has epi-pen  . Ceftin [Cefuroxime Axetil] Anaphylaxis  . Ciprofloxacin Anaphylaxis and Palpitations  . Ephedrine Other (See Comments) and Palpitations    "knocks me out"  . Sulfonamide Derivatives Anaphylaxis  . Telithromycin Anaphylaxis    Reaction: also blurred vision   . Valsartan Anaphylaxis  . Verapamil Anaphylaxis  . Venlafaxine Other (See Comments)    Insomnia, panic  . Beta Adrenergic Blockers     bradycardia  . Cephalosporins     Allergic Reaction  . Clindamycin/Lincomycin     Dry skin and itching/ Cdiff  . Cymbalta [Duloxetine Hcl] Other (See Comments)    Reaction:Confusion and " I fall down"  . Doxazosin     "blurred vision and irritable"  . Gabapentin Other (See Comments)    Reaction: headache  . Hydralazine     HA, Diarrhea  . Lamotrigine     Patient unsure at this time  . Morphine Other (See Comments)    Medication has no effect with pain  . Oxycodone-Acetaminophen Hives    Takes plain oxy IR at home (May 2018)  . Pregabalin Swelling  . Prochlorperazine Edisylate   . Pseudoephedrine Other (See Comments)    Reaction: hyperventilates and blacks out  . Relafen [Nabumetone] Swelling  . Zoloft [Sertraline] Other (See Comments)    "nervous/jittery"  . Doxycycline Hives     Discussed warning signs or symptoms. Please see discharge instructions. Patient expresses understanding.

## 2018-03-12 NOTE — Patient Instructions (Addendum)
Thank you for coming in today. Continue trintellix and mirtizapine.  Add wellbutrin 150mg  daily.  Recheck in 1 month.   Your oxycodone can be filled as early at the 17th.   Bupropion extended-release tablets (Depression/Mood Disorders) What is this medicine? BUPROPION (byoo PROE pee on) is used to treat depression. This medicine may be used for other purposes; ask your health care provider or pharmacist if you have questions. COMMON BRAND NAME(S): Aplenzin, Budeprion XL, Forfivo XL, Wellbutrin XL What should I tell my health care provider before I take this medicine? They need to know if you have any of these conditions: -an eating disorder, such as anorexia or bulimia -bipolar disorder or psychosis -diabetes or high blood sugar, treated with medication -glaucoma -head injury or brain tumor -heart disease, previous heart attack, or irregular heart beat -high blood pressure -kidney or liver disease -seizures (convulsions) -suicidal thoughts or a previous suicide attempt -Tourette's syndrome -weight loss -an unusual or allergic reaction to bupropion, other medicines, foods, dyes, or preservatives -breast-feeding -pregnant or trying to become pregnant How should I use this medicine? Take this medicine by mouth with a glass of water. Follow the directions on the prescription label. You can take it with or without food. If it upsets your stomach, take it with food. Do not crush, chew, or cut these tablets. This medicine is taken once daily at the same time each day. Do not take your medicine more often than directed. Do not stop taking this medicine suddenly except upon the advice of your doctor. Stopping this medicine too quickly may cause serious side effects or your condition may worsen. A special MedGuide will be given to you by the pharmacist with each prescription and refill. Be sure to read this information carefully each time. Talk to your pediatrician regarding the use of this  medicine in children. Special care may be needed. Overdosage: If you think you have taken too much of this medicine contact a poison control center or emergency room at once. NOTE: This medicine is only for you. Do not share this medicine with others. What if I miss a dose? If you miss a dose, skip the missed dose and take your next tablet at the regular time. Do not take double or extra doses. What may interact with this medicine? Do not take this medicine with any of the following medications: -linezolid -MAOIs like Azilect, Carbex, Eldepryl, Marplan, Nardil, and Parnate -methylene blue (injected into a vein) -other medicines that contain bupropion like Zyban This medicine may also interact with the following medications: -alcohol -certain medicines for anxiety or sleep -certain medicines for blood pressure like metoprolol, propranolol -certain medicines for depression or psychotic disturbances -certain medicines for HIV or AIDS like efavirenz, lopinavir, nelfinavir, ritonavir -certain medicines for irregular heart beat like propafenone, flecainide -certain medicines for Parkinson's disease like amantadine, levodopa -certain medicines for seizures like carbamazepine, phenytoin, phenobarbital -cimetidine -clopidogrel -cyclophosphamide -digoxin -furazolidone -isoniazid -nicotine -orphenadrine -procarbazine -steroid medicines like prednisone or cortisone -stimulant medicines for attention disorders, weight loss, or to stay awake -tamoxifen -theophylline -thiotepa -ticlopidine -tramadol -warfarin This list may not describe all possible interactions. Give your health care provider a list of all the medicines, herbs, non-prescription drugs, or dietary supplements you use. Also tell them if you smoke, drink alcohol, or use illegal drugs. Some items may interact with your medicine. What should I watch for while using this medicine? Tell your doctor if your symptoms do not get better  or if they get worse. Visit  your doctor or health care professional for regular checks on your progress. Because it may take several weeks to see the full effects of this medicine, it is important to continue your treatment as prescribed by your doctor. Patients and their families should watch out for new or worsening thoughts of suicide or depression. Also watch out for sudden changes in feelings such as feeling anxious, agitated, panicky, irritable, hostile, aggressive, impulsive, severely restless, overly excited and hyperactive, or not being able to sleep. If this happens, especially at the beginning of treatment or after a change in dose, call your health care professional. Avoid alcoholic drinks while taking this medicine. Drinking large amounts of alcoholic beverages, using sleeping or anxiety medicines, or quickly stopping the use of these agents while taking this medicine may increase your risk for a seizure. Do not drive or use heavy machinery until you know how this medicine affects you. This medicine can impair your ability to perform these tasks. Do not take this medicine close to bedtime. It may prevent you from sleeping. Your mouth may get dry. Chewing sugarless gum or sucking hard candy, and drinking plenty of water may help. Contact your doctor if the problem does not go away or is severe. The tablet shell for some brands of this medicine does not dissolve. This is normal. The tablet shell may appear whole in the stool. This is not a cause for concern. What side effects may I notice from receiving this medicine? Side effects that you should report to your doctor or health care professional as soon as possible: -allergic reactions like skin rash, itching or hives, swelling of the face, lips, or tongue -breathing problems -changes in vision -confusion -elevated mood, decreased need for sleep, racing thoughts, impulsive behavior -fast or irregular heartbeat -hallucinations, loss of  contact with reality -increased blood pressure -redness, blistering, peeling or loosening of the skin, including inside the mouth -seizures -suicidal thoughts or other mood changes -unusually weak or tired -vomiting Side effects that usually do not require medical attention (report to your doctor or health care professional if they continue or are bothersome): -constipation -headache -loss of appetite -nausea -tremors -weight loss This list may not describe all possible side effects. Call your doctor for medical advice about side effects. You may report side effects to FDA at 1-800-FDA-1088. Where should I keep my medicine? Keep out of the reach of children. Store at room temperature between 15 and 30 degrees C (59 and 86 degrees F). Throw away any unused medicine after the expiration date. NOTE: This sheet is a summary. It may not cover all possible information. If you have questions about this medicine, talk to your doctor, pharmacist, or health care provider.  2018 Elsevier/Gold Standard (2016-01-08 13:55:13)

## 2018-03-13 LAB — COMPLETE METABOLIC PANEL WITH GFR
AG Ratio: 1.5 (calc) (ref 1.0–2.5)
ALBUMIN MSPROF: 4.4 g/dL (ref 3.6–5.1)
ALKALINE PHOSPHATASE (APISO): 96 U/L (ref 33–130)
ALT: 17 U/L (ref 6–29)
AST: 27 U/L (ref 10–35)
BILIRUBIN TOTAL: 0.5 mg/dL (ref 0.2–1.2)
BUN / CREAT RATIO: 13 (calc) (ref 6–22)
BUN: 14 mg/dL (ref 7–25)
CHLORIDE: 94 mmol/L — AB (ref 98–110)
CO2: 31 mmol/L (ref 20–32)
Calcium: 10.1 mg/dL (ref 8.6–10.4)
Creat: 1.08 mg/dL — ABNORMAL HIGH (ref 0.60–0.88)
GFR, Est African American: 55 mL/min/{1.73_m2} — ABNORMAL LOW (ref >=60)
GFR, Est Non African American: 48 mL/min/{1.73_m2} — ABNORMAL LOW (ref >=60)
GLUCOSE: 144 mg/dL — AB (ref 65–99)
Globulin: 2.9 g/dL (calc) (ref 1.9–3.7)
POTASSIUM: 4.3 mmol/L (ref 3.5–5.3)
SODIUM: 136 mmol/L (ref 135–146)
Total Protein: 7.3 g/dL (ref 6.1–8.1)

## 2018-03-13 LAB — CBC
HCT: 42.5 % (ref 35.0–45.0)
HEMOGLOBIN: 14.1 g/dL (ref 11.7–15.5)
MCH: 29.9 pg (ref 27.0–33.0)
MCHC: 33.2 g/dL (ref 32.0–36.0)
MCV: 90.2 fL (ref 80.0–100.0)
MPV: 11 fL (ref 7.5–12.5)
Platelets: 258 10*3/uL (ref 140–400)
RBC: 4.71 10*6/uL (ref 3.80–5.10)
RDW: 12.3 % (ref 11.0–15.0)
WBC: 8.8 10*3/uL (ref 3.8–10.8)

## 2018-03-13 LAB — MAGNESIUM: MAGNESIUM: 1.8 mg/dL (ref 1.5–2.5)

## 2018-03-14 ENCOUNTER — Telehealth: Payer: Self-pay | Admitting: Family Medicine

## 2018-03-14 MED ORDER — ONDANSETRON HCL 4 MG PO TABS: 4.0000 mg | ORAL_TABLET | Freq: Three times a day (TID) | ORAL | 12 refills | Status: DC | PRN

## 2018-03-14 NOTE — Telephone Encounter (Signed)
Zofran refilled for 3 month supply.

## 2018-03-16 ENCOUNTER — Telehealth: Payer: Self-pay

## 2018-03-16 NOTE — Telephone Encounter (Signed)
[name] states she is going to stop taking the medication.

## 2018-03-16 NOTE — Telephone Encounter (Signed)
[name] called and states the Wellbutrin is causing some dizziness and swelling in legs and ankles. Please advise.

## 2018-03-16 NOTE — Telephone Encounter (Signed)
Keep trying for 1 more week. If not able to tolerate it stop.

## 2018-03-19 ENCOUNTER — Telehealth: Payer: Self-pay | Admitting: Family Medicine

## 2018-03-19 ENCOUNTER — Ambulatory Visit (INDEPENDENT_AMBULATORY_CARE_PROVIDER_SITE_OTHER): Payer: Medicare Other | Admitting: Family Medicine

## 2018-03-19 ENCOUNTER — Encounter: Payer: Self-pay | Admitting: Family Medicine

## 2018-03-19 VITALS — BP 118/68 | HR 91 | Wt 152.0 lb

## 2018-03-19 DIAGNOSIS — F332 Major depressive disorder, recurrent severe without psychotic features: Secondary | ICD-10-CM | POA: Diagnosis not present

## 2018-03-19 DIAGNOSIS — I10 Essential (primary) hypertension: Secondary | ICD-10-CM | POA: Diagnosis not present

## 2018-03-19 DIAGNOSIS — N183 Chronic kidney disease, stage 3 unspecified: Secondary | ICD-10-CM

## 2018-03-19 DIAGNOSIS — F411 Generalized anxiety disorder: Secondary | ICD-10-CM | POA: Diagnosis not present

## 2018-03-19 DIAGNOSIS — F5104 Psychophysiologic insomnia: Secondary | ICD-10-CM

## 2018-03-19 MED ORDER — FUROSEMIDE 20 MG PO TABS: 20.0000 mg | ORAL_TABLET | Freq: Every day | ORAL | 0 refills | Status: DC

## 2018-03-19 MED ORDER — QUETIAPINE FUMARATE 25 MG PO TABS: 25.0000 mg | ORAL_TABLET | Freq: Every day | ORAL | 0 refills | Status: DC

## 2018-03-19 MED ORDER — ONDANSETRON HCL 4 MG PO TABS: 4.0000 mg | ORAL_TABLET | Freq: Three times a day (TID) | ORAL | 12 refills | Status: DC | PRN

## 2018-03-19 NOTE — Telephone Encounter (Signed)
Noted  

## 2018-03-19 NOTE — Patient Instructions (Addendum)
Thank you for coming in today. Make sure to bring all the diabetes supplies and medicines for [facility] for the next visit.   STOP wellbutrin (buproiprion)   Add short course of lasix (1 pill daily in the morning) for leg swelling Try hold amlodipine (NORVASC)  Start seroquel at bedtime as needed.   Recheck with [facility].   We will continue to work with [facility]  [phone] phone [phone] fax

## 2018-03-19 NOTE — Telephone Encounter (Signed)
Heavenly called and she notes that last night when unable to sleep she cleaned up her old medicine.  She accidentally destroyed her oxycodone.  She is very upset.  She does not have a history of behavior in the past.  I believe this is an honest mistake.  Will discuss in person during her husband's visit tomorrow.

## 2018-03-19 NOTE — Progress Notes (Signed)
[name] is a 84 y.o. female who presents to [place] [place] today for leg swelling, anxiety/depression, and insomnia.   Mayvis notes a several day history of bilateral leg swelling.  She notes that she was recently started on Wellbutrin but stopped it greater than 4 days ago.  She notes some leg swelling for a few days.  She denies significant shortness of breath chest pain orthopnea.  She denies any other medication changes.  She continues amlodipine and Maxide for blood pressure.  Symptoms are mild and bilateral.  No significant treatment tried yet.  Patient finds compression stockings very difficult to apply.  Anxiety and depression: Venda notes that her anxiety and depression have worsened recently.  Her husband was recently admitted to the hospital is back home now requiring more care than ever.  Her depression had worsened for the last visit last week and she was started on Wellbutrin.  She notes she did not tolerate it well with jitteriness.  She discontinued Wellbutrin 4 days ago.  She notes that she remains very anxious and has significant depression symptoms.  She takes Trintellix, mirtazapine and Xanax.  She notes symptoms are quite bothersome at this time.  She notes increased life stressors.  Insomnia: Zonia notes insomnia happening as well.  This is been ongoing now for a day or 2.  Insomnia is bothersome.  She has difficulty falling asleep and staying asleep despite medications listed below.   ROS as above:  Exam:  BP 118/68   Pulse 91   Wt 152 lb (68.9 kg)   BMI 27.79 kg/m   Wt Readings from Last 5 Encounters:  03/19/18 152 lb (68.9 kg)  03/12/18 155 lb (70.3 kg)  02/14/18 150 lb (68 kg)  01/26/18 149 lb (67.6 kg)  01/16/18 153 lb (69.4 kg)    Gen: Well NAD HEENT: EOMI,  MMM Lungs: Normal work of breathing. CTABL Heart: RRR no MRG Abd:  NABS, Soft. Nondistended, Nontender Exts: Brisk capillary refill, warm and well perfused.  Trace lower extremity edema bilateral to calves. Psych alert and oriented.  Affect tearful.  Normal speech and thought process.  No SI or HI expressed.  Lab and Radiology Results   Chemistry      Component Value Date/Time   NA 136 03/12/2018 1449   NA 141 10/02/2013   K 4.3 03/12/2018 1449   CL 94 (L) 03/12/2018 1449   CO2 31 03/12/2018 1449   BUN 14 03/12/2018 1449   BUN 10 10/02/2013   CREATININE 1.08 (H) 03/12/2018 1449   GLU 104 10/02/2013      Component Value Date/Time   CALCIUM 10.1 03/12/2018 1449   CALCIUM 9.8 10/02/2013   ALKPHOS 84 01/25/2016 1447   AST 27 03/12/2018 1449   ALT 17 03/12/2018 1449   BILITOT 0.5 03/12/2018 1449        Assessment and Plan: 84 y.o. female with  Leg swelling: Unclear etiology.  Patient does have a history of mild CKD however this was recently checked last week and found to be normal.  Her only new medication is Wellbutrin which should not cause significant edema and was discontinued several days ago.  She already has 2 diuretics on board, Hydrocort thiazide and triamterene).  Plan to use very low-dose Lasix for a few days and recheck in the near future.  Return sooner if needed.  Use Lasix only as needed.  Edema is a new issue with  uncertain prognosis.  Anxiety and depression: Not well controlled.  Patient failed trial of Wellbutrin.  Medication added to allergy list.  Patient also has concomitant Sonya as well.  I like to avoid higher doses of benzodiazepines especially in the setting of opiates.  Discussed options.  Plan for trial of very low-dose Seroquel at bedtime.  I believe this will help augment with her anxiety and her depression symptoms.  We discussed the black box warning for this medicine.  Insomnia is a new issue with uncertain prognosis.  Recheck sooner than scheduled in early September if needed.    No orders of the defined types  were placed in this encounter.  Meds ordered this encounter  Medications  . furosemide (LASIX) 20 MG tablet    Sig: Take 1 tablet (20 mg total) by mouth daily.    Dispense:  30 tablet    Refill:  0  . QUEtiapine (SEROQUEL) 25 MG tablet    Sig: Take 1 tablet (25 mg total) by mouth at bedtime. For insomnia    Dispense:  30 tablet    Refill:  0     Historical information moved to improve visibility of documentation.  Past Medical History:  Diagnosis Date  . Anxiety   . Arthritis    osteoarthritis  . Asthma   . C. difficile diarrhea   . Candida esophagitis ([place])   . Chronic sinusitis   . Depression   . Depression   . Diverticulosis   . DJD (degenerative joint disease)     L5 compression fracture  . Edema extremities   . Fibromyalgia   . GERD (gastroesophageal reflux disease)   . Gout   . Hyperlipidemia   . Hypertension   . Osteoarthritis   . Osteopenia   . Panic disorder   . Renal failure, unspecified   . Spinal stenosis of lumbar region   . Tubular adenoma of colon   . Wrist fracture, left    Past Surgical History:  Procedure Laterality Date  . ADENOIDECTOMY    . CARPAL TUNNEL RELEASE  2007, 2009   Bilateral  . CATARACT EXTRACTION, BILATERAL    . CHOANAL ADENIODECTOMY    . DIAGNOSTIC LARYNGOSCOPY N/A 11/05/2015   Procedure: DIAGNOSTIC LARYNGOSCOPY AND ESOPHAGOSCOPY;  Surgeon: Rozetta Nunnery, MD;  Location: [facility];  Service: ENT;  Laterality: N/A;  . ganglion cyct removal on fingers     right  . ganglion cyst  wrist    . KNEE ARTHROSCOPY     right  . NASAL SINUS SURGERY    . OPEN REDUCTION INTERNAL FIXATION (ORIF) DISTAL RADIAL FRACTURE Left 12/18/2016   Procedure: OPEN REDUCTION INTERNAL FIXATION (ORIF) DISTAL RADIAL FRACTURE;  Surgeon: Iran Planas, MD;  Location: [facility];  Service: Orthopedics;  Laterality: Left;  . SHOULDER SURGERY  2004, 07/30/10, 01/2011   after her right humeral neck fracture, revision hemiarthroplasty 2004 for persistent pain,  revision reverse arthroplasty December 2011 for persistent pain, incision and drainage with poly-exchange 01/26/2011 for possible prosthetic joint infection.  . TONSILLECTOMY AND ADENOIDECTOMY    . TOTAL ABDOMINAL HYSTERECTOMY    . TOTAL HIP ARTHROPLASTY  2004   right  . TOTAL KNEE ARTHROPLASTY  2006   right  . TOTAL SHOULDER REPLACEMENT  2002   right   Social History   Tobacco Use  . Smoking status: Never Smoker  . Smokeless tobacco: Never Used  Substance Use Topics  . Alcohol use: No   family history includes Bladder Cancer in her  father; Breast cancer in her unknown relative; Colon polyps in her unknown relative; Depression in her son; Diabetes in her mother; Hypertension in her mother.  Medications: Current Outpatient Medications  Medication Sig Dispense Refill  . albuterol (PROVENTIL HFA;VENTOLIN HFA) 108 (90 Base) MCG/ACT inhaler Inhale 2 puffs into the lungs every 6 (six) hours as needed for wheezing or shortness of breath. 1 Inhaler 0  . ALPRAZolam (XANAX) 0.5 MG tablet Take 1 tablet (0.5 mg total) by mouth 4 (four) times daily. 120 tablet 3  . AMBULATORY NON FORMULARY MEDICATION Reclast infusion per protocol at infusion clinic in [place]. 1 Units 0  . amLODipine (NORVASC) 10 MG tablet TAKE 1 TABLET BY MOUTH ONCE DAILY 90 tablet 0  . Calcium Carbonate-Vitamin D (CALCIUM 600+D) 600-400 MG-UNIT per tablet Take 1 tablet by mouth 2 (two) times daily.    . cetirizine (ZYRTEC) 10 MG tablet Take 1 tablet (10 mg total) by mouth daily. 30 tablet 11  . clotrimazole-betamethasone (LOTRISONE) cream Apply 1 application topically 2 (two) times daily. 45 g 1  . diclofenac sodium (VOLTAREN) 1 % GEL Apply 4 g topically 4 (four) times daily. To affected joint. 500 g 99  . diphenhydrAMINE (BENADRYL) 25 MG tablet Take 25 mg by mouth every 6 (six) hours as needed. For allergies    . diphenoxylate-atropine (LOMOTIL) 2.5-0.025 MG tablet One to 2 tablets by mouth 4 times a day as needed for  diarrhea. 30 tablet 3  . fluticasone (FLONASE) 50 MCG/ACT nasal spray Place 2 sprays into both nostrils daily. 16 g 2  . metroNIDAZOLE (METROGEL) 0.75 % gel Apply 1 application topically 2 (two) times daily. 45 g 3  . mirtazapine (REMERON) 30 MG tablet TAKE 1 TABLET BY MOUTH AT BEDTIME 90 tablet 1  . montelukast (SINGULAIR) 10 MG tablet TAKE 1 TABLET BY MOUTH AT BEDTIME 90 tablet 3  . mupirocin ointment (BACTROBAN) 2 % Apply to affected area TID for 7 days. 30 g 3  . omeprazole (PRILOSEC) 20 MG capsule TAKE 1 CAPSULE BY MOUTH ONCE DAILY 90 capsule 1  . ondansetron (ZOFRAN) 4 MG tablet Take 1 tablet (4 mg total) by mouth every 8 (eight) hours as needed for nausea or vomiting. 270 tablet 12  . Oxycodone HCl 10 MG TABS Take 10mg  po 2-3x per day as needed for pain 70 tablet 0  . polyethylene glycol (MIRALAX / GLYCOLAX) packet Take 17 g by mouth daily. 14 each 0  . promethazine (PHENERGAN) 25 MG tablet Take 1 tablet (25 mg total) by mouth every 6 (six) hours as needed for nausea or vomiting. 30 tablet 2  . simvastatin (ZOCOR) 10 MG tablet TAKE 1 TABLET BY MOUTH ONCE DAILY 90 tablet 0  . triamterene-hydrochlorothiazide (MAXZIDE-25) 37.5-25 MG tablet Take 1 tablet by mouth daily. 90 tablet 3  . TRINTELLIX 5 MG TABS tablet Take 1 tablet (5 mg total) by mouth daily. 30 tablet 12  . zoledronic acid (RECLAST) 5 MG/100ML SOLN Inject 5 mg into the vein. Once yearly. In June    . furosemide (LASIX) 20 MG tablet Take 1 tablet (20 mg total) by mouth daily. 30 tablet 0  . QUEtiapine (SEROQUEL) 25 MG tablet Take 1 tablet (25 mg total) by mouth at bedtime. For insomnia 30 tablet 0   No current facility-administered medications for this visit.    Allergies  Allergen Reactions  . Bee Venom Anaphylaxis    Has epi-pen  . Ceftin [Cefuroxime Axetil] Anaphylaxis  . Ciprofloxacin Anaphylaxis and Palpitations  .  Ephedrine Other (See Comments) and Palpitations    "knocks me out"  . Sulfonamide Derivatives Anaphylaxis   . Telithromycin Anaphylaxis    Reaction: also blurred vision   . Valsartan Anaphylaxis  . Verapamil Anaphylaxis  . Venlafaxine Other (See Comments)    Insomnia, panic  . Beta Adrenergic Blockers     bradycardia  . Cephalosporins     Allergic Reaction  . Clindamycin/Lincomycin     Dry skin and itching/ Cdiff  . Cymbalta [Duloxetine Hcl] Other (See Comments)    Reaction:Confusion and " I fall down"  . Doxazosin     "blurred vision and irritable"  . Gabapentin Other (See Comments)    Reaction: headache  . Hydralazine     HA, Diarrhea  . Lamotrigine     Patient unsure at this time  . Morphine Other (See Comments)    Medication has no effect with pain  . Oxycodone-Acetaminophen Hives    Takes plain oxy IR at home (May 2018)  . Pregabalin Swelling  . Prochlorperazine Edisylate   . Pseudoephedrine Other (See Comments)    Reaction: hyperventilates and blacks out  . Relafen [Nabumetone] Swelling  . Zoloft [Sertraline] Other (See Comments)    "nervous/jittery"  . Doxycycline Hives  . Wellbutrin [Bupropion] Other (See Comments)    Jittery     Discussed warning signs or symptoms. Please see discharge instructions. Patient expresses understanding.

## 2018-03-20 ENCOUNTER — Telehealth: Payer: Self-pay | Admitting: Family Medicine

## 2018-03-20 MED ORDER — OXYCODONE HCL 10 MG PO TABS: ORAL_TABLET | ORAL | 0 refills | Status: DC

## 2018-03-20 NOTE — Telephone Encounter (Signed)
Per conversation last night will fill oxycodone early.  Rx sent

## 2018-03-21 ENCOUNTER — Telehealth: Payer: Self-pay | Admitting: Family Medicine

## 2018-03-21 MED ORDER — ALPRAZOLAM 0.5 MG PO TABS: 0.5000 mg | ORAL_TABLET | Freq: Four times a day (QID) | ORAL | 3 refills | Status: DC

## 2018-03-21 NOTE — Telephone Encounter (Signed)
Patient is transferring all her meds to [facility] and old pharmacy will not transfer the Xanax.  She is asking to please send a new prescription for her Xanax to [place] so she will not run out. KG LPN

## 2018-03-21 NOTE — Telephone Encounter (Signed)
Sent electronically.  Sometimes mail order pharmacies will not allow controlled substances.  We will send it and see what happens.  It may be possible that we have to keep doing this locally.

## 2018-03-22 ENCOUNTER — Other Ambulatory Visit: Payer: Self-pay

## 2018-03-22 MED ORDER — CALCIUM CARBONATE-VITAMIN D 600-400 MG-UNIT PO TABS: 1.0000 | ORAL_TABLET | Freq: Two times a day (BID) | ORAL | 3 refills | Status: AC

## 2018-03-22 MED ORDER — AMLODIPINE BESYLATE 10 MG PO TABS: 10.0000 mg | ORAL_TABLET | Freq: Every day | ORAL | 3 refills | Status: DC

## 2018-03-22 MED ORDER — SIMVASTATIN 10 MG PO TABS: 10.0000 mg | ORAL_TABLET | Freq: Every day | ORAL | 3 refills | Status: DC

## 2018-03-22 MED ORDER — POLYETHYLENE GLYCOL 3350 17 G PO PACK: 17.0000 g | PACK | Freq: Every day | ORAL | 0 refills | Status: AC

## 2018-03-22 MED ORDER — ALBUTEROL SULFATE HFA 108 (90 BASE) MCG/ACT IN AERS: 2.0000 | INHALATION_SPRAY | Freq: Four times a day (QID) | RESPIRATORY_TRACT | 0 refills | Status: DC | PRN

## 2018-03-22 NOTE — Telephone Encounter (Signed)
Patient has been informed. Rhonda Cunningham,CMA  

## 2018-03-26 NOTE — Telephone Encounter (Signed)
Patient has been approved for Lomitil from 03/26/2018 through 03/27/2019. Form sent to scan. Jonelle Sidle at pharmacy notified.

## 2018-03-28 ENCOUNTER — Telehealth: Payer: Self-pay

## 2018-03-28 NOTE — Telephone Encounter (Signed)
Konnie called and states she uses both Zofran tablets and ODT. She would like a refill for the ODT sent to [facility]. Please advised.

## 2018-03-29 MED ORDER — ONDANSETRON 4 MG PO TBDP: 4.0000 mg | ORAL_TABLET | Freq: Three times a day (TID) | ORAL | 0 refills | Status: DC | PRN

## 2018-03-29 NOTE — Telephone Encounter (Signed)
Switched to OTD from

## 2018-04-04 ENCOUNTER — Encounter: Payer: Self-pay | Admitting: Family Medicine

## 2018-04-04 ENCOUNTER — Ambulatory Visit (INDEPENDENT_AMBULATORY_CARE_PROVIDER_SITE_OTHER): Payer: Medicare Other | Admitting: Family Medicine

## 2018-04-04 VITALS — BP 132/80 | HR 101 | Temp 97.8°F | Wt 146.0 lb

## 2018-04-04 DIAGNOSIS — N183 Chronic kidney disease, stage 3 unspecified: Secondary | ICD-10-CM

## 2018-04-04 DIAGNOSIS — F331 Major depressive disorder, recurrent, moderate: Secondary | ICD-10-CM | POA: Diagnosis not present

## 2018-04-04 DIAGNOSIS — M545 Low back pain, unspecified: Secondary | ICD-10-CM

## 2018-04-04 DIAGNOSIS — M153 Secondary multiple arthritis: Secondary | ICD-10-CM | POA: Diagnosis not present

## 2018-04-04 DIAGNOSIS — G894 Chronic pain syndrome: Secondary | ICD-10-CM | POA: Diagnosis not present

## 2018-04-04 DIAGNOSIS — I10 Essential (primary) hypertension: Secondary | ICD-10-CM | POA: Diagnosis not present

## 2018-04-04 DIAGNOSIS — M810 Age-related osteoporosis without current pathological fracture: Secondary | ICD-10-CM

## 2018-04-04 DIAGNOSIS — Z66 Do not resuscitate: Secondary | ICD-10-CM

## 2018-04-04 DIAGNOSIS — F411 Generalized anxiety disorder: Secondary | ICD-10-CM

## 2018-04-04 DIAGNOSIS — Z23 Encounter for immunization: Secondary | ICD-10-CM

## 2018-04-04 DIAGNOSIS — M069 Rheumatoid arthritis, unspecified: Secondary | ICD-10-CM | POA: Diagnosis not present

## 2018-04-04 MED ORDER — FUROSEMIDE 20 MG PO TABS: 20.0000 mg | ORAL_TABLET | Freq: Every day | ORAL | 0 refills | Status: DC | PRN

## 2018-04-04 MED ORDER — DENOSUMAB 60 MG/ML ~~LOC~~ SOSY
60.0000 mg | PREFILLED_SYRINGE | Freq: Once | SUBCUTANEOUS | Status: AC
  Administered 2018-04-04: 60 mg via SUBCUTANEOUS

## 2018-04-04 NOTE — Patient Instructions (Addendum)
Thank you for coming in today. Recheck in 1 month.  Return sooner if needed.    

## 2018-04-04 NOTE — Progress Notes (Signed)
[name] is a 84 y.o. female who presents to [place]: [place] today for mood, osteoporosis, chronic pain, hypertension.  [name] has a history of long-standing depression and anxiety.  This is been not typically very well controlled.  She is feeling a lot better recently.  Her husband has diabetes and she feels like she is getting to be on top of that a bit and feeling a lot better.  Her anxiety is much less than it has been.  She has reduced her Xanax usage.  At the last visit she was prescribed Seroquel for insomnia.  She is taking it a few nights but not taking it regularly.  Osteoporosis: [name] has a history of severe significant osteoporosis with thoracic compression fractures.  She previously was managed with Reclast.  She notes getting infusions is very difficult.  She is interested in switching to Prolia if possible.  Chronic pain: [name] notes her chronic pain is also improved a bit.  She is managed to decrease her oxycodone use a bit.  She notes the medication is helpful and allows her to function.  Hypertension: [name] was able to discontinue amlodipine in the interim and notes that her blood pressures typically pretty well controlled.  She denies chest pain palpitations severe lightheadedness or dizziness.  [name] would like the flu vaccine today ROS as above:  Exam:  BP 132/80   Pulse (!) 101   Temp 97.8 F (36.6 C) (Oral)   Wt 146 lb (66.2 kg)   BMI 26.70 kg/m  Wt Readings from Last 5 Encounters:  04/04/18 146 lb (66.2 kg)  03/19/18 152 lb (68.9 kg)  03/12/18 155 lb (70.3 kg)  02/14/18 150 lb (68 kg)  01/26/18 149 lb (67.6 kg)    Gen: Well NAD HEENT: EOMI,  MMM Lungs: Normal work of breathing. CTABL Heart: RRR no MRG Abd: NABS, Soft. Nondistended, Nontender Exts: Brisk capillary refill, warm and well perfused.  Cervical and thoracic  spine: Cervical lordosis thoracic kyphosis.  Nontender to midline.  Tender palpation bilateral cervical and thoracic paraspinal muscle group. Psych alert and oriented normal speech thought process and affect.  No SI or HI expressed today.  Lab and Radiology Results   Chemistry      Component Value Date/Time   [name] 136 03/12/2018 1449   [name] 141 10/02/2013   K 4.3 03/12/2018 1449   CL 94 (L) 03/12/2018 1449   CO2 31 03/12/2018 1449   BUN 14 03/12/2018 1449   BUN 10 10/02/2013   CREATININE 1.08 (H) 03/12/2018 1449   GLU 104 10/02/2013      Component Value Date/Time   CALCIUM 10.1 03/12/2018 1449   CALCIUM 9.8 10/02/2013   ALKPHOS 84 01/25/2016 1447   AST 27 03/12/2018 1449   ALT 17 03/12/2018 1449   BILITOT 0.5 03/12/2018 1449        Assessment and Plan: 84 y.o. female with  Mood: Significant improvement.  Watchful waiting continue current regimen and recheck in 1 month.  Osteoporosis: Switch to Prolia.  Patient was given first dose of Prolia today.  She was within the 6-week window of calcium check obtained last month.  Hypertension: Blood pressure reasonably well controlled.  Continue discontinued amlodipine.  Pain: Doing well continue current regimen recheck in 1 month.  Most form filled out today.  Influenza vaccine given today prior to discharge.  Orders Placed This Encounter  Procedures  . Flu vaccine HIGH DOSE PF (  Fluzone High dose)   Meds ordered this encounter  Medications  . furosemide (LASIX) 20 MG tablet    Sig: Take 1 tablet (20 mg total) by mouth daily as needed for fluid.    Dispense:  30 tablet    Refill:  0  . denosumab (PROLIA) injection 60 mg     Historical information moved to improve visibility of documentation.  Past Medical History:  Diagnosis Date  . Anxiety   . Arthritis    osteoarthritis  . Asthma   . C. difficile diarrhea   . Candida esophagitis ([place])   . Chronic sinusitis   . Depression   . Depression   . Diverticulosis   .  DJD (degenerative joint disease)     L5 compression fracture  . Edema extremities   . Fibromyalgia   . GERD (gastroesophageal reflux disease)   . Gout   . Hyperlipidemia   . Hypertension   . Osteoarthritis   . Osteopenia   . Panic disorder   . Renal failure, unspecified   . Spinal stenosis of lumbar region   . Tubular adenoma of colon   . Wrist fracture, left    Past Surgical History:  Procedure Laterality Date  . ADENOIDECTOMY    . CARPAL TUNNEL RELEASE  2007, 2009   Bilateral  . CATARACT EXTRACTION, BILATERAL    . CHOANAL ADENIODECTOMY    . DIAGNOSTIC LARYNGOSCOPY N/A 11/05/2015   Procedure: DIAGNOSTIC LARYNGOSCOPY AND ESOPHAGOSCOPY;  Surgeon: Rozetta Nunnery, MD;  Location: [facility];  Service: ENT;  Laterality: N/A;  . ganglion cyct removal on fingers     right  . ganglion cyst  wrist    . KNEE ARTHROSCOPY     right  . NASAL SINUS SURGERY    . OPEN REDUCTION INTERNAL FIXATION (ORIF) DISTAL RADIAL FRACTURE Left 12/18/2016   Procedure: OPEN REDUCTION INTERNAL FIXATION (ORIF) DISTAL RADIAL FRACTURE;  Surgeon: Iran Planas, MD;  Location: [facility];  Service: Orthopedics;  Laterality: Left;  . SHOULDER SURGERY  2004, 07/30/10, 01/2011   after her right humeral neck fracture, revision hemiarthroplasty 2004 for persistent pain, revision reverse arthroplasty December 2011 for persistent pain, incision and drainage with poly-exchange 01/26/2011 for possible prosthetic joint infection.  . TONSILLECTOMY AND ADENOIDECTOMY    . TOTAL ABDOMINAL HYSTERECTOMY    . TOTAL HIP ARTHROPLASTY  2004   right  . TOTAL KNEE ARTHROPLASTY  2006   right  . TOTAL SHOULDER REPLACEMENT  2002   right   Social History   Tobacco Use  . Smoking status: Never Smoker  . Smokeless tobacco: Never Used  Substance Use Topics  . Alcohol use: No   family history includes Bladder Cancer in her father; Breast cancer in her unknown relative; Colon polyps in her unknown relative; Depression in her son; Diabetes  in her mother; Hypertension in her mother.  Medications: Current Outpatient Medications  Medication Sig Dispense Refill  . albuterol (PROVENTIL HFA;VENTOLIN HFA) 108 (90 Base) MCG/ACT inhaler Inhale 2 puffs into the lungs every 6 (six) hours as needed for wheezing or shortness of breath. 1 Inhaler 0  . ALPRAZolam (XANAX) 0.5 MG tablet Take 1 tablet (0.5 mg total) by mouth 4 (four) times daily. 360 tablet 3  . Calcium Carbonate-Vitamin D (CALCIUM 600+D) 600-400 MG-UNIT tablet Take 1 tablet by mouth 2 (two) times daily. 90 tablet 3  . cetirizine (ZYRTEC) 10 MG tablet Take 1 tablet (10 mg total) by mouth daily. 30 tablet 11  . clotrimazole-betamethasone (LOTRISONE) cream  Apply 1 application topically 2 (two) times daily. 45 g 1  . diclofenac sodium (VOLTAREN) 1 % GEL Apply 4 g topically 4 (four) times daily. To affected joint. 500 g 99  . diphenhydrAMINE (BENADRYL) 25 MG tablet Take 25 mg by mouth every 6 (six) hours as needed. For allergies    . diphenoxylate-atropine (LOMOTIL) 2.5-0.025 MG tablet One to 2 tablets by mouth 4 times a day as needed for diarrhea. 30 tablet 3  . fluticasone (FLONASE) 50 MCG/ACT nasal spray Place 2 sprays into both nostrils daily. 16 g 2  . furosemide (LASIX) 20 MG tablet Take 1 tablet (20 mg total) by mouth daily as needed for fluid. 30 tablet 0  . metroNIDAZOLE (METROGEL) 0.75 % gel Apply 1 application topically 2 (two) times daily. 45 g 3  . mirtazapine (REMERON) 30 MG tablet TAKE 1 TABLET BY MOUTH AT BEDTIME 90 tablet 1  . montelukast (SINGULAIR) 10 MG tablet TAKE 1 TABLET BY MOUTH AT BEDTIME 90 tablet 3  . mupirocin ointment (BACTROBAN) 2 % Apply to affected area TID for 7 days. 30 g 3  . omeprazole (PRILOSEC) 20 MG capsule TAKE 1 CAPSULE BY MOUTH ONCE DAILY 90 capsule 1  . ondansetron (ZOFRAN ODT) 4 MG disintegrating tablet Take 1 tablet (4 mg total) by mouth every 8 (eight) hours as needed for nausea or vomiting. 270 tablet 0  . Oxycodone HCl 10 MG TABS Take  10mg  po 2-3x per day as needed for pain 70 tablet 0  . polyethylene glycol (MIRALAX / GLYCOLAX) packet Take 17 g by mouth daily. 14 each 0  . promethazine (PHENERGAN) 25 MG tablet Take 1 tablet (25 mg total) by mouth every 6 (six) hours as needed for nausea or vomiting. 30 tablet 2  . QUEtiapine (SEROQUEL) 25 MG tablet Take 1 tablet (25 mg total) by mouth at bedtime. For insomnia 30 tablet 0  . simvastatin (ZOCOR) 10 MG tablet Take 1 tablet (10 mg total) by mouth daily. 90 tablet 3  . triamterene-hydrochlorothiazide (MAXZIDE-25) 37.5-25 MG tablet Take 1 tablet by mouth daily. 90 tablet 3  . TRINTELLIX 5 MG TABS tablet Take 1 tablet (5 mg total) by mouth daily. 30 tablet 12  . zoledronic acid (RECLAST) 5 MG/100ML SOLN Inject 5 mg into the vein. Once yearly. In June    . ondansetron (ZOFRAN) 4 MG tablet Take 1 tablet (4 mg total) by mouth every 8 (eight) hours as needed for nausea or vomiting. 20 tablet 0   No current facility-administered medications for this visit.    Allergies  Allergen Reactions  . Bee Venom Anaphylaxis    Has epi-pen  . Ceftin [Cefuroxime Axetil] Anaphylaxis  . Ciprofloxacin Anaphylaxis and Palpitations  . Ephedrine Other (See Comments) and Palpitations    "knocks me out"  . Sulfonamide Derivatives Anaphylaxis  . Telithromycin Anaphylaxis    Reaction: also blurred vision   . Valsartan Anaphylaxis  . Verapamil Anaphylaxis  . Venlafaxine Other (See Comments)    Insomnia, panic  . Beta Adrenergic Blockers     bradycardia  . Cephalosporins     Allergic Reaction  . Clindamycin/Lincomycin     Dry skin and itching/ Cdiff  . Cymbalta [Duloxetine Hcl] Other (See Comments)    Reaction:Confusion and " I fall down"  . Doxazosin     "blurred vision and irritable"  . Gabapentin Other (See Comments)    Reaction: headache  . Hydralazine     HA, Diarrhea  . Lamotrigine  Patient unsure at this time  . Morphine Other (See Comments)    Medication has no effect with pain   . Oxycodone-Acetaminophen Hives    Takes plain oxy IR at home (May 2018)  . Pregabalin Swelling  . Prochlorperazine Edisylate   . Pseudoephedrine Other (See Comments)    Reaction: hyperventilates and blacks out  . Relafen [Nabumetone] Swelling  . Zoloft [Sertraline] Other (See Comments)    "nervous/jittery"  . Doxycycline Hives  . Wellbutrin [Bupropion] Other (See Comments)    Jittery     Discussed warning signs or symptoms. Please see discharge instructions. Patient expresses understanding.

## 2018-04-18 ENCOUNTER — Other Ambulatory Visit: Payer: Self-pay

## 2018-04-18 MED ORDER — MIRTAZAPINE 30 MG PO TABS: 30.0000 mg | ORAL_TABLET | Freq: Every day | ORAL | 1 refills | Status: DC

## 2018-04-19 ENCOUNTER — Telehealth: Payer: Self-pay | Admitting: Family Medicine

## 2018-04-19 ENCOUNTER — Other Ambulatory Visit: Payer: Self-pay | Admitting: Family Medicine

## 2018-04-19 MED ORDER — MIRTAZAPINE 30 MG PO TABS: 30.0000 mg | ORAL_TABLET | Freq: Every day | ORAL | 12 refills | Status: DC

## 2018-04-19 NOTE — Telephone Encounter (Signed)
Refill request for mirtazapine received via fax.  Will send electronic prescription for mirtazapine to [facility].

## 2018-04-20 ENCOUNTER — Other Ambulatory Visit: Payer: Self-pay

## 2018-04-20 NOTE — Telephone Encounter (Signed)
Pt requesting RF on Oxycodone.  Last RX written 03-20-18.  I have pended RX.  Please review and send if appropriate.   Thanks!

## 2018-04-24 MED ORDER — OXYCODONE HCL 10 MG PO TABS: ORAL_TABLET | ORAL | 0 refills | Status: DC

## 2018-04-24 NOTE — Telephone Encounter (Signed)
Pt advised.

## 2018-05-01 ENCOUNTER — Ambulatory Visit: Payer: Self-pay | Admitting: Family Medicine

## 2018-05-02 ENCOUNTER — Ambulatory Visit (INDEPENDENT_AMBULATORY_CARE_PROVIDER_SITE_OTHER): Payer: Medicare Other | Admitting: Family Medicine

## 2018-05-02 ENCOUNTER — Encounter: Payer: Self-pay | Admitting: Family Medicine

## 2018-05-02 VITALS — BP 155/74 | HR 78 | Wt 143.0 lb

## 2018-05-02 DIAGNOSIS — F331 Major depressive disorder, recurrent, moderate: Secondary | ICD-10-CM

## 2018-05-02 DIAGNOSIS — F411 Generalized anxiety disorder: Secondary | ICD-10-CM | POA: Diagnosis not present

## 2018-05-02 DIAGNOSIS — M069 Rheumatoid arthritis, unspecified: Secondary | ICD-10-CM | POA: Diagnosis not present

## 2018-05-02 DIAGNOSIS — M810 Age-related osteoporosis without current pathological fracture: Secondary | ICD-10-CM | POA: Diagnosis not present

## 2018-05-02 DIAGNOSIS — G894 Chronic pain syndrome: Secondary | ICD-10-CM | POA: Diagnosis not present

## 2018-05-02 DIAGNOSIS — N183 Chronic kidney disease, stage 3 (moderate): Secondary | ICD-10-CM | POA: Diagnosis not present

## 2018-05-02 DIAGNOSIS — F332 Major depressive disorder, recurrent severe without psychotic features: Secondary | ICD-10-CM | POA: Diagnosis not present

## 2018-05-02 DIAGNOSIS — I10 Essential (primary) hypertension: Secondary | ICD-10-CM | POA: Diagnosis not present

## 2018-05-02 NOTE — Patient Instructions (Addendum)
Thank you for coming in today. Continue current medications. Recheck with vernon on or after Nov 15th.  Return sooner if needed.   We will continue to track blood pressure.

## 2018-05-02 NOTE — Progress Notes (Signed)
[name] is a 84 y.o. female who presents to [place]: [place] today for follow-up mood, chronic pain, and HTN.   [name] notes that her anxiety and depression symptoms have worsened recently.  Her husband is in poor health and she has increased stressors at home.  She continues to take the medications below.  She rates her symptoms and is slightly worse than her baseline.  She denies any active SI or HI.  Chronic pain: [name] is doing well with the medications below.  She had she continues to have pain and tries to use the oxycodone sparingly but notes that it does help.  Medication allows her to function at home and take care of her household tasks.  Hypertension: [name] takes medications below.  No chest pain palpitations shortness of breath.  She thinks her blood pressures typically better controlled at home.:     ROS as above:  Exam:  BP (!) 155/74   Pulse 78   Wt 143 lb (64.9 kg)   BMI 26.15 kg/m  Wt Readings from Last 5 Encounters:  05/02/18 143 lb (64.9 kg)  04/04/18 146 lb (66.2 kg)  03/19/18 152 lb (68.9 kg)  03/12/18 155 lb (70.3 kg)  02/14/18 150 lb (68 kg)    Gen: Well NAD HEENT: EOMI,  MMM Lungs: Normal work of breathing. CTABL Heart: RRR no MRG Abd: NABS, Soft. Nondistended, Nontender Exts: Brisk capillary refill, warm and well perfused.  Psych alert and oriented normal speech thought process and affect is tearful.  No SI or HI expressed.  Lab and Radiology Results Recent Results (from the past 2160 hour(s))  COMPLETE METABOLIC PANEL WITH GFR     Status: Abnormal   Collection Time: 03/12/18  2:49 PM  Result Value Ref Range   Glucose, Bld 144 (H) 65 - 99 mg/dL    Comment: .            Fasting reference interval . For someone without known diabetes, a glucose value >125 mg/dL indicates that they may have diabetes and this should be  confirmed with a follow-up test. .    BUN 14 7 - 25 mg/dL   Creat 1.08 (H) 0.60 - 0.88 mg/dL    Comment: For patients >25 years of age, the reference limit for Creatinine is approximately 13% higher for people identified as African-American. .    GFR, Est Non African American 48 (L) > OR = 60 mL/min/1.50m2   GFR, Est African American 55 (L) > OR = 60 mL/min/1.77m2   BUN/Creatinine Ratio 13 6 - 22 (calc)   Sodium 136 135 - 146 mmol/L   Potassium 4.3 3.5 - 5.3 mmol/L   Chloride 94 (L) 98 - 110 mmol/L   CO2 31 20 - 32 mmol/L   Calcium 10.1 8.6 - 10.4 mg/dL   Total Protein 7.3 6.1 - 8.1 g/dL   Albumin 4.4 3.6 - 5.1 g/dL   Globulin 2.9 1.9 - 3.7 g/dL (calc)   AG Ratio 1.5 1.0 - 2.5 (calc)   Total Bilirubin 0.5 0.2 - 1.2 mg/dL   Alkaline phosphatase (APISO) 96 33 - 130 U/L   AST 27 10 - 35 U/L   ALT 17 6 - 29 U/L  CBC     Status: None   Collection Time: 03/12/18  2:49 PM  Result Value Ref Range   WBC 8.8 3.8 - 10.8 Thousand/uL   RBC 4.71 3.80 - 5.10 Million/uL  Hemoglobin 14.1 11.7 - 15.5 g/dL   HCT 42.5 35.0 - 45.0 %   MCV 90.2 80.0 - 100.0 fL   MCH 29.9 27.0 - 33.0 pg   MCHC 33.2 32.0 - 36.0 g/dL   RDW 12.3 11.0 - 15.0 %   Platelets 258 140 - 400 Thousand/uL   MPV 11.0 7.5 - 12.5 fL  Magnesium     Status: None   Collection Time: 03/12/18  2:49 PM  Result Value Ref Range   Magnesium 1.8 1.5 - 2.5 mg/dL  Magnesium     Status: None   Collection Time: 05/02/18 10:29 AM  Result Value Ref Range   Magnesium 1.7 1.5 - 2.5 mg/dL      Assessment and Plan: 84 y.o. female with  Mood: Depression and anxiety.  Slightly worsened due to increased life stressors.  Continue current medications and medical regimen.  Use Xanax sparingly recheck in about 6 weeks.  Chronic pain: Doing reasonably well continue current regimen.  Careful use of oxycodone.  Recheck in 6 weeks or so.  Hypertension: Blood pressure bit elevated today.  Metabolic panel recently checked and normal.  Plan for  recheck in the near future.      Historical information moved to improve visibility of documentation.  Past Medical History:  Diagnosis Date  . Anxiety   . Arthritis    osteoarthritis  . Asthma   . C. difficile diarrhea   . Candida esophagitis ([place])   . Chronic sinusitis   . Depression   . Depression   . Diverticulosis   . DJD (degenerative joint disease)     L5 compression fracture  . Edema extremities   . Fibromyalgia   . GERD (gastroesophageal reflux disease)   . Gout   . Hyperlipidemia   . Hypertension   . Osteoarthritis   . Osteopenia   . Panic disorder   . Renal failure, unspecified   . Spinal stenosis of lumbar region   . Tubular adenoma of colon   . Wrist fracture, left    Past Surgical History:  Procedure Laterality Date  . ADENOIDECTOMY    . CARPAL TUNNEL RELEASE  2007, 2009   Bilateral  . CATARACT EXTRACTION, BILATERAL    . CHOANAL ADENIODECTOMY    . DIAGNOSTIC LARYNGOSCOPY N/A 11/05/2015   Procedure: DIAGNOSTIC LARYNGOSCOPY AND ESOPHAGOSCOPY;  Surgeon: Rozetta Nunnery, MD;  Location: [facility];  Service: ENT;  Laterality: N/A;  . ganglion cyct removal on fingers     right  . ganglion cyst  wrist    . KNEE ARTHROSCOPY     right  . NASAL SINUS SURGERY    . OPEN REDUCTION INTERNAL FIXATION (ORIF) DISTAL RADIAL FRACTURE Left 12/18/2016   Procedure: OPEN REDUCTION INTERNAL FIXATION (ORIF) DISTAL RADIAL FRACTURE;  Surgeon: Iran Planas, MD;  Location: [place];  Service: Orthopedics;  Laterality: Left;  . SHOULDER SURGERY  2004, 07/30/10, 01/2011   after her right humeral neck fracture, revision hemiarthroplasty 2004 for persistent pain, revision reverse arthroplasty December 2011 for persistent pain, incision and drainage with poly-exchange 01/26/2011 for possible prosthetic joint infection.  . TONSILLECTOMY AND ADENOIDECTOMY    . TOTAL ABDOMINAL HYSTERECTOMY    . TOTAL HIP ARTHROPLASTY  2004   right  . TOTAL KNEE ARTHROPLASTY  2006   right  . TOTAL  SHOULDER REPLACEMENT  2002   right   Social History   Tobacco Use  . Smoking status: Never Smoker  . Smokeless tobacco: Never Used  Substance Use Topics  .  Alcohol use: No   family history includes Bladder Cancer in her father; Breast cancer in her unknown relative; Colon polyps in her unknown relative; Depression in her son; Diabetes in her mother; Hypertension in her mother.  Medications: Current Outpatient Medications  Medication Sig Dispense Refill  . albuterol (PROVENTIL HFA;VENTOLIN HFA) 108 (90 Base) MCG/ACT inhaler Inhale 2 puffs into the lungs every 6 (six) hours as needed for wheezing or shortness of breath. 1 Inhaler 0  . ALPRAZolam (XANAX) 0.5 MG tablet Take 1 tablet (0.5 mg total) by mouth 4 (four) times daily. 360 tablet 3  . Calcium Carbonate-Vitamin D (CALCIUM 600+D) 600-400 MG-UNIT tablet Take 1 tablet by mouth 2 (two) times daily. 90 tablet 3  . cetirizine (ZYRTEC) 10 MG tablet Take 1 tablet (10 mg total) by mouth daily. 30 tablet 11  . clotrimazole-betamethasone (LOTRISONE) cream Apply 1 application topically 2 (two) times daily. 45 g 1  . diclofenac sodium (VOLTAREN) 1 % GEL Apply 4 g topically 4 (four) times daily. To affected joint. 500 g 99  . diphenhydrAMINE (BENADRYL) 25 MG tablet Take 25 mg by mouth every 6 (six) hours as needed. For allergies    . diphenoxylate-atropine (LOMOTIL) 2.5-0.025 MG tablet One to 2 tablets by mouth 4 times a day as needed for diarrhea. 30 tablet 3  . fluticasone (FLONASE) 50 MCG/ACT nasal spray Place 2 sprays into both nostrils daily. 16 g 2  . furosemide (LASIX) 20 MG tablet Take 1 tablet (20 mg total) by mouth daily as needed for fluid. 30 tablet 0  . metroNIDAZOLE (METROGEL) 0.75 % gel Apply 1 application topically 2 (two) times daily. 45 g 3  . mirtazapine (REMERON) 30 MG tablet Take 1 tablet (30 mg total) by mouth at bedtime. 90 tablet 12  . montelukast (SINGULAIR) 10 MG tablet TAKE 1 TABLET BY MOUTH AT BEDTIME 90 tablet 3  .  mupirocin ointment (BACTROBAN) 2 % Apply to affected area TID for 7 days. 30 g 3  . omeprazole (PRILOSEC) 20 MG capsule TAKE 1 CAPSULE BY MOUTH ONCE DAILY 90 capsule 1  . ondansetron (ZOFRAN ODT) 4 MG disintegrating tablet Take 1 tablet (4 mg total) by mouth every 8 (eight) hours as needed for nausea or vomiting. 270 tablet 0  . ondansetron (ZOFRAN) 4 MG tablet Take 1 tablet (4 mg total) by mouth every 8 (eight) hours as needed for nausea or vomiting. 20 tablet 0  . Oxycodone HCl 10 MG TABS Take 10mg  po 2-3x per day as needed for pain 70 tablet 0  . polyethylene glycol (MIRALAX / GLYCOLAX) packet Take 17 g by mouth daily. 14 each 0  . promethazine (PHENERGAN) 25 MG tablet Take 1 tablet (25 mg total) by mouth every 6 (six) hours as needed for nausea or vomiting. 30 tablet 2  . QUEtiapine (SEROQUEL) 25 MG tablet Take 1 tablet (25 mg total) by mouth at bedtime. For insomnia 30 tablet 0  . simvastatin (ZOCOR) 10 MG tablet Take 1 tablet (10 mg total) by mouth daily. 90 tablet 3  . triamterene-hydrochlorothiazide (MAXZIDE-25) 37.5-25 MG tablet TAKE 1 TABLET BY MOUTH EVERY DAY GENERIC EQUIVALENT FOR MAXZIDE-25 90 tablet 3  . TRINTELLIX 5 MG TABS tablet Take 1 tablet (5 mg total) by mouth daily. 30 tablet 12  . zoledronic acid (RECLAST) 5 MG/100ML SOLN Inject 5 mg into the vein. Once yearly. In June     No current facility-administered medications for this visit.    Allergies  Allergen Reactions  .  Bee Venom Anaphylaxis    Has epi-pen  . Ceftin [Cefuroxime Axetil] Anaphylaxis  . Ciprofloxacin Anaphylaxis and Palpitations  . Ephedrine Other (See Comments) and Palpitations    "knocks me out"  . Sulfonamide Derivatives Anaphylaxis  . Telithromycin Anaphylaxis    Reaction: also blurred vision   . Valsartan Anaphylaxis  . Verapamil Anaphylaxis  . Venlafaxine Other (See Comments)    Insomnia, panic  . Beta Adrenergic Blockers     bradycardia  . Cephalosporins     Allergic Reaction  .  Clindamycin/Lincomycin     Dry skin and itching/ Cdiff  . Cymbalta [Duloxetine Hcl] Other (See Comments)    Reaction:Confusion and " I fall down"  . Doxazosin     "blurred vision and irritable"  . Gabapentin Other (See Comments)    Reaction: headache  . Hydralazine     HA, Diarrhea  . Lamotrigine     Patient unsure at this time  . Morphine Other (See Comments)    Medication has no effect with pain  . Oxycodone-Acetaminophen Hives    Takes plain oxy IR at home (May 2018)  . Pregabalin Swelling  . Prochlorperazine Edisylate   . Pseudoephedrine Other (See Comments)    Reaction: hyperventilates and blacks out  . Relafen [Nabumetone] Swelling  . Zoloft [Sertraline] Other (See Comments)    "nervous/jittery"  . Doxycycline Hives  . Wellbutrin [Bupropion] Other (See Comments)    Jittery     Discussed warning signs or symptoms. Please see discharge instructions. Patient expresses understanding.

## 2018-05-03 LAB — MAGNESIUM: Magnesium: 1.7 mg/dL (ref 1.5–2.5)

## 2018-05-21 ENCOUNTER — Ambulatory Visit: Payer: Self-pay | Admitting: Family Medicine

## 2018-05-29 ENCOUNTER — Ambulatory Visit (INDEPENDENT_AMBULATORY_CARE_PROVIDER_SITE_OTHER): Payer: Medicare Other | Admitting: Family Medicine

## 2018-05-29 ENCOUNTER — Telehealth: Payer: Self-pay | Admitting: Family Medicine

## 2018-05-29 ENCOUNTER — Encounter: Payer: Self-pay | Admitting: Family Medicine

## 2018-05-29 VITALS — BP 149/72 | HR 91 | Ht 60.0 in | Wt 141.0 lb

## 2018-05-29 DIAGNOSIS — M545 Low back pain, unspecified: Secondary | ICD-10-CM

## 2018-05-29 DIAGNOSIS — K0889 Other specified disorders of teeth and supporting structures: Secondary | ICD-10-CM

## 2018-05-29 DIAGNOSIS — F332 Major depressive disorder, recurrent severe without psychotic features: Secondary | ICD-10-CM | POA: Diagnosis not present

## 2018-05-29 DIAGNOSIS — M069 Rheumatoid arthritis, unspecified: Secondary | ICD-10-CM

## 2018-05-29 DIAGNOSIS — G894 Chronic pain syndrome: Secondary | ICD-10-CM

## 2018-05-29 DIAGNOSIS — H9390 Unspecified disorder of ear, unspecified ear: Secondary | ICD-10-CM

## 2018-05-29 DIAGNOSIS — F411 Generalized anxiety disorder: Secondary | ICD-10-CM | POA: Diagnosis not present

## 2018-05-29 DIAGNOSIS — I10 Essential (primary) hypertension: Secondary | ICD-10-CM

## 2018-05-29 MED ORDER — TRINTELLIX 5 MG PO TABS: 5.0000 mg | ORAL_TABLET | Freq: Every day | ORAL | 3 refills | Status: DC

## 2018-05-29 MED ORDER — OXYCODONE HCL 10 MG PO TABS: ORAL_TABLET | ORAL | 0 refills | Status: DC

## 2018-05-29 MED ORDER — CLONIDINE HCL ER 0.1 MG PO TB12: 0.1000 mg | ORAL_TABLET | Freq: Two times a day (BID) | ORAL | 1 refills | Status: DC

## 2018-05-29 MED ORDER — AMOXICILLIN 500 MG PO CAPS: 500.0000 mg | ORAL_CAPSULE | Freq: Three times a day (TID) | ORAL | 0 refills | Status: DC

## 2018-05-29 NOTE — Patient Instructions (Addendum)
Thank you for coming in today. Start clonidine.  Take at bedtime for a few days then increase to twice daily.  This will help with blood pressure and jittery.   Recheck in 1 month.  Return sooner if needed.   I would love to see if you can get some group activity with other seniors.   The ear wound try to keep the pressure off it.  Keep it covered with ointment.   Try a medicine free corn pad to reduce the pressure.   Use amoxicillin for dental infection and follow up with your dentist.   Clonidine tablets What is this medicine? CLONIDINE (KLOE ni deen) is used to treat high blood pressure. This medicine may be used for other purposes; ask your health care provider or pharmacist if you have questions. COMMON BRAND NAME(S): Catapres What should I tell my health care provider before I take this medicine? They need to know if you have any of these conditions: -kidney disease -an unusual or allergic reaction to clonidine, other medicines, foods, dyes, or preservatives -pregnant or trying to get pregnant -breast-feeding How should I use this medicine? Take this medicine by mouth with a glass of water. Follow the directions on the prescription label. Take your doses at regular intervals. Do not take your medicine more often than directed. Do not suddenly stop taking this medicine. You must gradually reduce the dose or you may get a dangerous increase in blood pressure. Ask your doctor or health care professional for advice. Talk to your pediatrician regarding the use of this medicine in children. Special care may be needed. Overdosage: If you think you have taken too much of this medicine contact a poison control center or emergency room at once. NOTE: This medicine is only for you. Do not share this medicine with others. What if I miss a dose? If you miss a dose, take it as soon as you can. If it is almost time for your next dose, take only that dose. Do not take double or extra  doses. What may interact with this medicine? Do not take this medicine with any of the following medications: -MAOIs like Carbex, Eldepryl, Marplan, Nardil, and Parnate This medicine may also interact with the following medications: -barbiturate medicines for inducing sleep or treating seizures like phenobarbital -certain medicines for blood pressure, heart disease, irregular heart beat -certain medicines for depression, anxiety, or psychotic disturbances -prescription pain medicines This list may not describe all possible interactions. Give your health care provider a list of all the medicines, herbs, non-prescription drugs, or dietary supplements you use. Also tell them if you smoke, drink alcohol, or use illegal drugs. Some items may interact with your medicine. What should I watch for while using this medicine? Visit your doctor or health care professional for regular checks on your progress. Check your heart rate and blood pressure regularly while you are taking this medicine. Ask your doctor or health care professional what your heart rate should be and when you should contact him or her. You may get drowsy or dizzy. Do not drive, use machinery, or do anything that needs mental alertness until you know how this medicine affects you. To avoid dizzy or fainting spells, do not stand or sit up quickly, especially if you are an older person. Alcohol can make you more drowsy and dizzy. Avoid alcoholic drinks. Your mouth may get dry. Chewing sugarless gum or sucking hard candy, and drinking plenty of water will help. Do not treat yourself for  coughs, colds, or pain while you are taking this medicine without asking your doctor or health care professional for advice. Some ingredients may increase your blood pressure. If you are going to have surgery tell your doctor or health care professional that you are taking this medicine. What side effects may I notice from receiving this medicine? Side effects  that you should report to your doctor or health care professional as soon as possible: -allergic reactions like skin rash, itching or hives, swelling of the face, lips, or tongue -anxiety, nervousness -chest pain -depression -fast, irregular heartbeat -swelling of feet or legs -unusually weak or tired Side effects that usually do not require medical attention (report to your doctor or health care professional if they continue or are bothersome): -change in sex drive or performance -constipation -headache This list may not describe all possible side effects. Call your doctor for medical advice about side effects. You may report side effects to FDA at 1-800-FDA-1088. Where should I keep my medicine? Keep out of the reach of children. Store at room temperature between 15 and 30 degrees C (59 and 86 degrees F). Protect from light. Keep container tightly closed. Throw away any unused medicine after the expiration date. NOTE: This sheet is a summary. It may not cover all possible information. If you have questions about this medicine, talk to your doctor, pharmacist, or health care provider.  2018 Elsevier/Gold Standard (2011-01-12 13:01:28)

## 2018-05-29 NOTE — Progress Notes (Signed)
[name] is a 84 y.o. female who presents to [facility] today for hypertension, mood, chronic pain, left ear lesion, dental pain, paresthesias.  [name] has a history of hypertension.  She takes Maxide daily.  She said significant bradycardic episodes with near PEA arrest with beta-blockers and has had anaphylaxis with angiotensin receptor blockers in the past.  She has multiple drug allergies.  She had significant leg swelling with amlodipine.  She notes sometimes she feels jitteriness but denies frank chest pain or palpitations.  She denies severe shortness of breath.  Additionally she notes her mood is stable but not well controlled.  She notes continued anxiety episodes and crying.  She feels trapped in the house and a bit claustrophobic with her husband.  She takes medications listed below.  Additionally she notes from a chronic pain perspective she is doing reasonably well.  She takes oxycodone listed below.  The oxycodone allows her to function and be more independent including clean her house.  She denies of noxious side effects such as severe constipation or fatigue or significant sleepiness.  She notes lesion on her left ear.  She had a crusted lesion at the helix of her left ear for some time now.  She had a shave biopsy about a year ago that was fortunately unremarkable without malignancy.  She notes she will have a lesion occurring there off and on that sometimes has trouble healing.  She is been using topical antibiotic ointment which does help some.  She thinks is more of a pressure issue when she lays her head down to sleep at night.  Additionally she notes some dental pain.  She has pain in the left lower jaw and notes that she has some teeth that will need extraction or management.  She is waiting to get an appoint with her dentist and thinks that her  dentist should be calling in amoxicillin but is worried about it worsening.  Lastly she notes some numbness in her feet bilaterally.  This is been ongoing for months without change or injury.  She denies a history of paresthesias or peripheral neuropathy.   ROS as above: No new headache, visual changes, nausea, vomiting, diarrhea, constipation, dizziness, abdominal pain, skin rash, fevers, chills, night sweats, weight loss, swollen lymph nodes, body aches, joint swelling, muscle aches, chest pain, shortness of breath, mood changes, visual or auditory hallucinations.     Exam:  BP (!) 149/72   Pulse 91   Ht 5' (1.524 m)   Wt 141 lb (64 kg)   BMI 27.54 kg/m  Wt Readings from Last 5 Encounters:  05/29/18 141 lb (64 kg)  05/02/18 143 lb (64.9 kg)  04/04/18 146 lb (66.2 kg)  03/19/18 152 lb (68.9 kg)  03/12/18 155 lb (70.3 kg)    Gen: Well NAD HEENT: EOMI,  MMM skin left ear helix small shallow superficial ulceration.  Teeth: Slight erythema at the base of the left lower bicuspid mildly tender to palpation no abscess or drainage. Lungs: Normal work of breathing. CTABL Heart: RRR no MRG Abd: NABS, Soft. Nondistended, Nontender Exts: Brisk capillary refill, warm and well perfused.  Spine: Significant cervical kyphosis.  Nontender spinal midline diffusely tender throughout cervical thoracic and lumbar paraspinal musculature.  Patient ambulates with a Keeble. Psych alert and oriented tearful affect at times.  Normal speech and thought process.  No SI or HI expressed. Feet bilaterally claw toe deformity otherwise well-appearing.  No erythema.  Nontender.  Decreased monofilament testing left worse compared to right.  Intact pulses and capillary refill.    Assessment and Plan: 84 y.o. female with  Hypertension blood pressure not well controlled.  Patient allergic to multiple different medications.  Will try extended release clonidine as this may help the blood pressure as well as some mood  symptoms.  Recheck in 1 month.  Chronic pain doing well continue oxycodone.  Mood: Stable continue current regimen.  Cautioned use of oxycodone and opiates at same time.  Recommend trials of group activities outside the house if possible. Severe symptoms.   Feet numbness: Decreased monofilament testing likely peripheral neuropathy.  Patient elects to wait on further testing at this time.  Ear lesion: More like pressure injury.  And biopsy last year was reassuring.  Plan for pressure offloading with non-medicated corn pad.  Dental pain: Recommend follow-up with dentist.  Back-up prescription of amoxicillin for use if dentist does not prescribe amoxicillin.  No orders of the defined types were placed in this encounter.  Meds ordered this encounter  Medications  . TRINTELLIX 5 MG TABS tablet    Sig: Take 1 tablet (5 mg total) by mouth daily.    Dispense:  90 tablet    Refill:  3  . Oxycodone HCl 10 MG TABS    Sig: Take 10mg  po 2-3x per day as needed for pain    Dispense:  70 tablet    Refill:  0  . cloNIDine HCl (KAPVAY) 0.1 MG TB12 ER tablet    Sig: Take 1 tablet (0.1 mg total) by mouth 2 (two) times daily.    Dispense:  60 tablet    Refill:  1  . amoxicillin (AMOXIL) 500 MG capsule    Sig: Take 1 capsule (500 mg total) by mouth 3 (three) times daily.    Dispense:  30 capsule    Refill:  0     Historical information moved to improve visibility of documentation.  Past Medical History:  Diagnosis Date  . Anxiety   . Arthritis    osteoarthritis  . Asthma   . C. difficile diarrhea   . Candida esophagitis ([place])   . Chronic sinusitis   . Depression   . Depression   . Diverticulosis   . DJD (degenerative joint disease)     L5 compression fracture  . Edema extremities   . Fibromyalgia   . GERD (gastroesophageal reflux disease)   . Gout   . Hyperlipidemia   . Hypertension   . Osteoarthritis   . Osteopenia   . Panic disorder   . Renal failure, unspecified   . Spinal  stenosis of lumbar region   . Tubular adenoma of colon   . Wrist fracture, left    Past Surgical History:  Procedure Laterality Date  . ADENOIDECTOMY    . CARPAL TUNNEL RELEASE  2007, 2009   Bilateral  . CATARACT EXTRACTION, BILATERAL    . CHOANAL ADENIODECTOMY    . DIAGNOSTIC LARYNGOSCOPY N/A 11/05/2015   Procedure: DIAGNOSTIC LARYNGOSCOPY AND ESOPHAGOSCOPY;  Surgeon: Rozetta Nunnery, MD;  Location: [facility];  Service: ENT;  Laterality: N/A;  . ganglion cyct removal on fingers     right  . ganglion cyst  wrist    . KNEE ARTHROSCOPY     right  . NASAL SINUS SURGERY    . OPEN REDUCTION INTERNAL FIXATION (ORIF) DISTAL RADIAL FRACTURE Left 12/18/2016   Procedure: OPEN REDUCTION INTERNAL FIXATION (ORIF) DISTAL RADIAL FRACTURE;  Surgeon: Iran Planas, MD;  Location: [facility];  Service: Orthopedics;  Laterality: Left;  . SHOULDER SURGERY  2004, 07/30/10, 01/2011   after her right humeral neck fracture, revision hemiarthroplasty 2004 for persistent pain, revision reverse arthroplasty December 2011 for persistent pain, incision and drainage with poly-exchange 01/26/2011 for possible prosthetic joint infection.  . TONSILLECTOMY AND ADENOIDECTOMY    . TOTAL ABDOMINAL HYSTERECTOMY    . TOTAL HIP ARTHROPLASTY  2004   right  . TOTAL KNEE ARTHROPLASTY  2006   right  . TOTAL SHOULDER REPLACEMENT  2002   right   Social History   Tobacco Use  . Smoking status: Never Smoker  . Smokeless tobacco: Never Used  Substance Use Topics  . Alcohol use: No   family history includes Bladder Cancer in her father; Breast cancer in her unknown relative; Colon polyps in her unknown relative; Depression in her son; Diabetes in her mother; Hypertension in her mother.  Medications: Current Outpatient Medications  Medication Sig Dispense Refill  . albuterol (PROVENTIL HFA;VENTOLIN HFA) 108 (90 Base) MCG/ACT inhaler Inhale 2 puffs into the lungs every 6 (six) hours as needed for wheezing or shortness of breath.  1 Inhaler 0  . ALPRAZolam (XANAX) 0.5 MG tablet Take 1 tablet (0.5 mg total) by mouth 4 (four) times daily. 360 tablet 3  . Calcium Carbonate-Vitamin D (CALCIUM 600+D) 600-400 MG-UNIT tablet Take 1 tablet by mouth 2 (two) times daily. 90 tablet 3  . cetirizine (ZYRTEC) 10 MG tablet Take 1 tablet (10 mg total) by mouth daily. 30 tablet 11  . clotrimazole-betamethasone (LOTRISONE) cream Apply 1 application topically 2 (two) times daily. 45 g 1  . denosumab (PROLIA) 60 MG/ML SOSY injection Inject 60 mg into the skin every 6 (six) months.    . diclofenac sodium (VOLTAREN) 1 % GEL Apply 4 g topically 4 (four) times daily. To affected joint. 500 g 99  . diphenhydrAMINE (BENADRYL) 25 MG tablet Take 25 mg by mouth every 6 (six) hours as needed. For allergies    . diphenoxylate-atropine (LOMOTIL) 2.5-0.025 MG tablet One to 2 tablets by mouth 4 times a day as needed for diarrhea. 30 tablet 3  . fluticasone (FLONASE) 50 MCG/ACT nasal spray Place 2 sprays into both nostrils daily. 16 g 2  . furosemide (LASIX) 20 MG tablet Take 1 tablet (20 mg total) by mouth daily as needed for fluid. 30 tablet 0  . metroNIDAZOLE (METROGEL) 0.75 % gel Apply 1 application topically 2 (two) times daily. 45 g 3  . mirtazapine (REMERON) 30 MG tablet Take 1 tablet (30 mg total) by mouth at bedtime. 90 tablet 12  . montelukast (SINGULAIR) 10 MG tablet TAKE 1 TABLET BY MOUTH AT BEDTIME 90 tablet 3  . mupirocin ointment (BACTROBAN) 2 % Apply to affected area TID for 7 days. 30 g 3  . omeprazole (PRILOSEC) 20 MG capsule TAKE 1 CAPSULE BY MOUTH ONCE DAILY 90 capsule 1  . ondansetron (ZOFRAN ODT) 4 MG disintegrating tablet Take 1 tablet (4 mg total) by mouth every 8 (eight) hours as needed for nausea or vomiting. 270 tablet 0  . ondansetron (ZOFRAN) 4 MG tablet Take 1 tablet (4 mg total) by mouth every 8 (eight) hours as needed for nausea or vomiting. 20 tablet 0  . Oxycodone HCl 10 MG TABS Take 10mg  po 2-3x per day as needed for pain 70  tablet 0  . polyethylene glycol (MIRALAX / GLYCOLAX) packet Take 17 g by mouth daily. 14 each 0  .  promethazine (PHENERGAN) 25 MG tablet Take 1 tablet (25 mg total) by mouth every 6 (six) hours as needed for nausea or vomiting. 30 tablet 2  . QUEtiapine (SEROQUEL) 25 MG tablet Take 1 tablet (25 mg total) by mouth at bedtime. For insomnia 30 tablet 0  . simvastatin (ZOCOR) 10 MG tablet Take 1 tablet (10 mg total) by mouth daily. 90 tablet 3  . triamterene-hydrochlorothiazide (MAXZIDE-25) 37.5-25 MG tablet TAKE 1 TABLET BY MOUTH EVERY DAY GENERIC EQUIVALENT FOR MAXZIDE-25 90 tablet 3  . TRINTELLIX 5 MG TABS tablet Take 1 tablet (5 mg total) by mouth daily. 90 tablet 3  . amoxicillin (AMOXIL) 500 MG capsule Take 1 capsule (500 mg total) by mouth 3 (three) times daily. 30 capsule 0  . cloNIDine HCl (KAPVAY) 0.1 MG TB12 ER tablet Take 1 tablet (0.1 mg total) by mouth 2 (two) times daily. 60 tablet 1   No current facility-administered medications for this visit.    Allergies  Allergen Reactions  . Bee Venom Anaphylaxis    Has epi-pen  . Ceftin [Cefuroxime Axetil] Anaphylaxis  . Ciprofloxacin Anaphylaxis and Palpitations  . Ephedrine Other (See Comments) and Palpitations    "knocks me out"  . Sulfonamide Derivatives Anaphylaxis  . Telithromycin Anaphylaxis    Reaction: also blurred vision   . Valsartan Anaphylaxis  . Verapamil Anaphylaxis  . Venlafaxine Other (See Comments)    Insomnia, panic  . Beta Adrenergic Blockers     bradycardia  . Cephalosporins     Allergic Reaction  . Clindamycin/Lincomycin     Dry skin and itching/ Cdiff  . Cymbalta [Duloxetine Hcl] Other (See Comments)    Reaction:Confusion and " I fall down"  . Doxazosin     "blurred vision and irritable"  . Gabapentin Other (See Comments)    Reaction: headache  . Hydralazine     HA, Diarrhea  . Lamotrigine     Patient unsure at this time  . Morphine Other (See Comments)    Medication has no effect with pain  .  Oxycodone-Acetaminophen Hives    Takes plain oxy IR at home (May 2018)  . Pregabalin Swelling  . Prochlorperazine Edisylate   . Pseudoephedrine Other (See Comments)    Reaction: hyperventilates and blacks out  . Relafen [Nabumetone] Swelling  . Zoloft [Sertraline] Other (See Comments)    "nervous/jittery"  . Doxycycline Hives  . Wellbutrin [Bupropion] Other (See Comments)    Jittery     Discussed warning signs or symptoms. Please see discharge instructions. Patient expresses understanding.

## 2018-05-29 NOTE — Telephone Encounter (Signed)
Pt called. She wants you to call [employer] Patient Assistance program so that they can fax application to [place] so that she can get her Trintellix free. She has an appointment this morning at 10:30 with Dr C and would like to have application at this time so that Dr C can have it to complete. Their phone number is [phone].  Thanks.

## 2018-05-29 NOTE — Telephone Encounter (Signed)
Spoke to [employer] with patient assistance, was able to print from off website at "[email or web address].[email or web address]"  Form given to Dr Georgina Snell

## 2018-06-01 ENCOUNTER — Telehealth: Payer: Self-pay | Admitting: Family Medicine

## 2018-06-01 NOTE — Telephone Encounter (Signed)
I received a letter stating that you have been approved for the patient assistance program for the Trintellix.  You should receive your medications in the mail in the near future.  After than 90 days is due to run out you can call [phone] for a refill request.  Your case number is [id number]

## 2018-06-01 NOTE — Telephone Encounter (Signed)
Called the patient spouse and he said patient was busy so he will have the patient to call back. Tarek Cravens,CMA

## 2018-06-04 ENCOUNTER — Telehealth: Payer: Self-pay

## 2018-06-04 NOTE — Telephone Encounter (Signed)
[name] called and states the clonidine is causing her pulse to drop to 59 bpm. She also feels very tired. She wants to stop taking the medication. Please advise.

## 2018-06-05 NOTE — Telephone Encounter (Signed)
Stop the medicine.

## 2018-06-05 NOTE — Telephone Encounter (Signed)
Caren advised of recommendations.

## 2018-06-12 ENCOUNTER — Telehealth: Payer: Self-pay | Admitting: Cardiology

## 2018-06-12 ENCOUNTER — Ambulatory Visit: Payer: Self-pay | Admitting: Family Medicine

## 2018-06-12 NOTE — Telephone Encounter (Signed)
Pt called top report that she has been having problems with her BP and HR.[name] She has been managed by Dr. Georgina Snell at [facility] that past couple of years and has not seen Dr. Radford Pax since 10/2015... She says that she would rather get back in with Cardiology for her to managed. She says that her BP has been up and down and her HR has been fluctuating with her meds... She does not have a list of her readings.. She denied chest pain, dizziness, sob... Just upset that her BP and HR not staying "normal".. I advised her that Dr. Radford Pax is scheduling well into 09/2018 but she really wants to be seen sooner even if with a new MD, PA or NP... In looking up her upcoming appts...she had cancelled her appt with Dr. Georgina Snell her PMD this morning. I told her that I will forward her message on to our schedulers to make her an appt. Pt verbalized understanding and agrees.

## 2018-06-12 NOTE — Telephone Encounter (Signed)
New Message   STAT if HR is under 50 or over 120 (normal HR is 60-100 beats per minute)  1) What is your heart rate? 95-100  2) Do you have a log of your heart rate readings (document readings)? States just between 95-100 but when she takes clonidine it goes down to 60  3) Do you have any other symptoms? Nervous feeling

## 2018-06-21 DIAGNOSIS — H26493 Other secondary cataract, bilateral: Secondary | ICD-10-CM | POA: Diagnosis not present

## 2018-06-21 DIAGNOSIS — H04123 Dry eye syndrome of bilateral lacrimal glands: Secondary | ICD-10-CM | POA: Diagnosis not present

## 2018-06-26 ENCOUNTER — Ambulatory Visit (INDEPENDENT_AMBULATORY_CARE_PROVIDER_SITE_OTHER): Payer: Medicare Other | Admitting: Family Medicine

## 2018-06-26 ENCOUNTER — Ambulatory Visit: Payer: Self-pay | Admitting: Cardiology

## 2018-06-26 ENCOUNTER — Encounter: Payer: Self-pay | Admitting: Family Medicine

## 2018-06-26 VITALS — BP 130/86 | HR 94 | Ht 60.0 in | Wt 141.0 lb

## 2018-06-26 DIAGNOSIS — G894 Chronic pain syndrome: Secondary | ICD-10-CM | POA: Diagnosis not present

## 2018-06-26 DIAGNOSIS — F411 Generalized anxiety disorder: Secondary | ICD-10-CM

## 2018-06-26 DIAGNOSIS — M069 Rheumatoid arthritis, unspecified: Secondary | ICD-10-CM

## 2018-06-26 DIAGNOSIS — M545 Low back pain, unspecified: Secondary | ICD-10-CM

## 2018-06-26 DIAGNOSIS — I10 Essential (primary) hypertension: Secondary | ICD-10-CM | POA: Diagnosis not present

## 2018-06-26 DIAGNOSIS — F331 Major depressive disorder, recurrent, moderate: Secondary | ICD-10-CM | POA: Diagnosis not present

## 2018-06-26 MED ORDER — OXYCODONE HCL 10 MG PO TABS: ORAL_TABLET | ORAL | 0 refills | Status: DC

## 2018-06-26 NOTE — Patient Instructions (Addendum)
Thank you for coming in today.  Continue current medicines.   Recheck in 1 month as scheduled.  Return sooner if needed.

## 2018-06-26 NOTE — Progress Notes (Signed)
[name] is a 84 y.o. female who presents to [place]: [place] today for follow-up chronic pain, hypertension, mood.  Sharan is doing reasonably well.  She notes that her pain has stabilized and she is currently taking medications below.  She does have daily pain but notes the oxycodone does help to control pain allows her to function.  Cedrica additionally notes that her mood is not great but stabilized.  She notes continued anxiety and depressive symptoms.  She takes medications listed below which definitely help.  Additionally she is takes medications listed below for hypertension.  She notes her blood pressures typically well controlled at home.  No chest pain or palpitations.    ROS as above:  Exam:  BP 130/86   Pulse 94   Ht 5' (1.524 m)   Wt 141 lb (64 kg)   BMI 27.54 kg/m  Wt Readings from Last 5 Encounters:  06/26/18 141 lb (64 kg)  05/29/18 141 lb (64 kg)  05/02/18 143 lb (64.9 kg)  04/04/18 146 lb (66.2 kg)  03/19/18 152 lb (68.9 kg)    Gen: Well NAD HEENT: EOMI,  MMM Lungs: Normal work of breathing. CTABL Heart: RRR no MRG Abd: NABS, Soft. Nondistended, Nontender Exts: Brisk capillary refill, warm and well perfused. MSK: Significant thoracic kyphosis.  Tender palpation thoracic paraspinal musculature. Mood: Alert and oriented normal speech thought process and affect.  Depression screen [facility] 2/9 06/26/2018 06/26/2018 03/12/2018 02/14/2018 01/26/2018  Decreased Interest 3 2 3 3 3   Down, Depressed, Hopeless 3 2 3 3 3   PHQ - 2 Score 6 4 6 6 6   Altered sleeping 1 2 3 2 3   Tired, decreased energy 3 2 3 2 3   Change in appetite 1 2 2 2 3   Feeling bad or failure about yourself  2 2 3 2 3   Trouble concentrating 0 2 3 1 2   Moving slowly or fidgety/restless 0 1 - 0 1  Suicidal thoughts 0 1 0 0 1  PHQ-9 Score 13 16 20 15 22   Difficult doing  work/chores Very difficult Somewhat difficult Somewhat difficult Somewhat difficult Somewhat difficult  Some recent data might be hidden    Lab and Radiology Results No results found for this or any previous visit (from the past 72 hour(s)). No results found.    Assessment and Plan: 84 y.o. female with  Chronic pain doing reasonably well continue current regimen.  Anxiety depression: Stable but not well controlled.  This is about as good as it is been in years.  Continue current regimen.  Keep total dose of Xanax low if possible.  Hypertension: Blood pressure stabilized and doing reasonably well continue current regimen.  Patient researched [facility] Controlled Substance Reporting System.  No orders of the defined types were placed in this encounter.  No orders of the defined types were placed in this encounter.    Historical information moved to improve visibility of documentation.  Past Medical History:  Diagnosis Date  . Anxiety   . Arthritis    osteoarthritis  . Asthma   . C. difficile diarrhea   . Candida esophagitis ([place])   . Chronic sinusitis   . Depression   . Depression   . Diverticulosis   . DJD (degenerative joint disease)     L5 compression fracture  . Edema extremities   . Fibromyalgia   . GERD (gastroesophageal reflux disease)   . Gout   .  Hyperlipidemia   . Hypertension   . Osteoarthritis   . Osteopenia   . Panic disorder   . Renal failure, unspecified   . Spinal stenosis of lumbar region   . Tubular adenoma of colon   . Wrist fracture, left    Past Surgical History:  Procedure Laterality Date  . ADENOIDECTOMY    . CARPAL TUNNEL RELEASE  2007, 2009   Bilateral  . CATARACT EXTRACTION, BILATERAL    . CHOANAL ADENIODECTOMY    . DIAGNOSTIC LARYNGOSCOPY N/A 11/05/2015   Procedure: DIAGNOSTIC LARYNGOSCOPY AND ESOPHAGOSCOPY;  Surgeon: Rozetta Nunnery, MD;  Location: [facility];  Service: ENT;  Laterality: N/A;  . ganglion cyct removal on  fingers     right  . ganglion cyst  wrist    . KNEE ARTHROSCOPY     right  . NASAL SINUS SURGERY    . OPEN REDUCTION INTERNAL FIXATION (ORIF) DISTAL RADIAL FRACTURE Left 12/18/2016   Procedure: OPEN REDUCTION INTERNAL FIXATION (ORIF) DISTAL RADIAL FRACTURE;  Surgeon: Iran Planas, MD;  Location: [facility];  Service: Orthopedics;  Laterality: Left;  . SHOULDER SURGERY  2004, 07/30/10, 01/2011   after her right humeral neck fracture, revision hemiarthroplasty 2004 for persistent pain, revision reverse arthroplasty December 2011 for persistent pain, incision and drainage with poly-exchange 01/26/2011 for possible prosthetic joint infection.  . TONSILLECTOMY AND ADENOIDECTOMY    . TOTAL ABDOMINAL HYSTERECTOMY    . TOTAL HIP ARTHROPLASTY  2004   right  . TOTAL KNEE ARTHROPLASTY  2006   right  . TOTAL SHOULDER REPLACEMENT  2002   right   Social History   Tobacco Use  . Smoking status: Never Smoker  . Smokeless tobacco: Never Used  Substance Use Topics  . Alcohol use: No   family history includes Bladder Cancer in her father; Breast cancer in her unknown relative; Colon polyps in her unknown relative; Depression in her son; Diabetes in her mother; Hypertension in her mother.  Medications: Current Outpatient Medications  Medication Sig Dispense Refill  . albuterol (PROVENTIL HFA;VENTOLIN HFA) 108 (90 Base) MCG/ACT inhaler Inhale 2 puffs into the lungs every 6 (six) hours as needed for wheezing or shortness of breath. 1 Inhaler 0  . ALPRAZolam (XANAX) 0.5 MG tablet Take 1 tablet (0.5 mg total) by mouth 4 (four) times daily. 360 tablet 3  . Calcium Carbonate-Vitamin D (CALCIUM 600+D) 600-400 MG-UNIT tablet Take 1 tablet by mouth 2 (two) times daily. 90 tablet 3  . cetirizine (ZYRTEC) 10 MG tablet Take 1 tablet (10 mg total) by mouth daily. 30 tablet 11  . clotrimazole-betamethasone (LOTRISONE) cream Apply 1 application topically 2 (two) times daily. 45 g 1  . denosumab (PROLIA) 60 MG/ML SOSY  injection Inject 60 mg into the skin every 6 (six) months.    . diclofenac sodium (VOLTAREN) 1 % GEL Apply 4 g topically 4 (four) times daily. To affected joint. 500 g 99  . diphenhydrAMINE (BENADRYL) 25 MG tablet Take 25 mg by mouth every 6 (six) hours as needed. For allergies    . diphenoxylate-atropine (LOMOTIL) 2.5-0.025 MG tablet One to 2 tablets by mouth 4 times a day as needed for diarrhea. 30 tablet 3  . fluticasone (FLONASE) 50 MCG/ACT nasal spray Place 2 sprays into both nostrils daily. 16 g 2  . furosemide (LASIX) 20 MG tablet Take 1 tablet (20 mg total) by mouth daily as needed for fluid. 30 tablet 0  . metroNIDAZOLE (METROGEL) 0.75 % gel Apply 1 application topically 2 (two)  times daily. 45 g 3  . mirtazapine (REMERON) 30 MG tablet Take 1 tablet (30 mg total) by mouth at bedtime. 90 tablet 12  . montelukast (SINGULAIR) 10 MG tablet TAKE 1 TABLET BY MOUTH AT BEDTIME 90 tablet 3  . mupirocin ointment (BACTROBAN) 2 % Apply to affected area TID for 7 days. 30 g 3  . omeprazole (PRILOSEC) 20 MG capsule TAKE 1 CAPSULE BY MOUTH ONCE DAILY 90 capsule 1  . ondansetron (ZOFRAN ODT) 4 MG disintegrating tablet Take 1 tablet (4 mg total) by mouth every 8 (eight) hours as needed for nausea or vomiting. 270 tablet 0  . ondansetron (ZOFRAN) 4 MG tablet Take 1 tablet (4 mg total) by mouth every 8 (eight) hours as needed for nausea or vomiting. 20 tablet 0  . Oxycodone HCl 10 MG TABS Take 10mg  po 2-3x per day as needed for pain 70 tablet 0  . polyethylene glycol (MIRALAX / GLYCOLAX) packet Take 17 g by mouth daily. 14 each 0  . promethazine (PHENERGAN) 25 MG tablet Take 1 tablet (25 mg total) by mouth every 6 (six) hours as needed for nausea or vomiting. 30 tablet 2  . QUEtiapine (SEROQUEL) 25 MG tablet Take 1 tablet (25 mg total) by mouth at bedtime. For insomnia 30 tablet 0  . simvastatin (ZOCOR) 10 MG tablet Take 1 tablet (10 mg total) by mouth daily. 90 tablet 3  . triamterene-hydrochlorothiazide  (MAXZIDE-25) 37.5-25 MG tablet TAKE 1 TABLET BY MOUTH EVERY DAY GENERIC EQUIVALENT FOR MAXZIDE-25 90 tablet 3  . TRINTELLIX 5 MG TABS tablet Take 1 tablet (5 mg total) by mouth daily. 90 tablet 3   No current facility-administered medications for this visit.    Allergies  Allergen Reactions  . Bee Venom Anaphylaxis    Has epi-pen  . Ceftin [Cefuroxime Axetil] Anaphylaxis  . Ciprofloxacin Anaphylaxis and Palpitations  . Ephedrine Other (See Comments) and Palpitations    "knocks me out"  . Sulfonamide Derivatives Anaphylaxis  . Telithromycin Anaphylaxis    Reaction: also blurred vision   . Valsartan Anaphylaxis  . Verapamil Anaphylaxis  . Venlafaxine Other (See Comments)    Insomnia, panic  . Beta Adrenergic Blockers     bradycardia  . Cephalosporins     Allergic Reaction  . Clindamycin/Lincomycin     Dry skin and itching/ Cdiff  . Cymbalta [Duloxetine Hcl] Other (See Comments)    Reaction:Confusion and " I fall down"  . Doxazosin     "blurred vision and irritable"  . Gabapentin Other (See Comments)    Reaction: headache  . Hydralazine     HA, Diarrhea  . Lamotrigine     Patient unsure at this time  . Morphine Other (See Comments)    Medication has no effect with pain  . Oxycodone-Acetaminophen Hives    Takes plain oxy IR at home (May 2018)  . Pregabalin Swelling  . Prochlorperazine Edisylate   . Pseudoephedrine Other (See Comments)    Reaction: hyperventilates and blacks out  . Relafen [Nabumetone] Swelling  . Zoloft [Sertraline] Other (See Comments)    "nervous/jittery"  . Doxycycline Hives  . Wellbutrin [Bupropion] Other (See Comments)    Jittery     Discussed warning signs or symptoms. Please see discharge instructions. Patient expresses understanding.

## 2018-07-19 ENCOUNTER — Telehealth: Payer: Self-pay | Admitting: Family Medicine

## 2018-07-19 NOTE — Telephone Encounter (Signed)
Received refill request from [facility] for alprazolam, furosemide, and diclofenac gel.  We will confirm with patient that this is correct.  My understanding is we are using a different mail order pharmacy.

## 2018-07-19 NOTE — Telephone Encounter (Signed)
Fareeha states it is [facility].

## 2018-07-20 ENCOUNTER — Other Ambulatory Visit: Payer: Self-pay

## 2018-07-20 ENCOUNTER — Encounter: Payer: Self-pay | Admitting: Family Medicine

## 2018-07-20 MED ORDER — FUROSEMIDE 20 MG PO TABS: 20.0000 mg | ORAL_TABLET | Freq: Every day | ORAL | 0 refills | Status: DC | PRN

## 2018-07-20 MED ORDER — MIRTAZAPINE 30 MG PO TABS: 30.0000 mg | ORAL_TABLET | Freq: Every day | ORAL | 12 refills | Status: DC

## 2018-07-20 MED ORDER — ONDANSETRON 4 MG PO TBDP: 4.0000 mg | ORAL_TABLET | Freq: Three times a day (TID) | ORAL | 0 refills | Status: DC | PRN

## 2018-07-20 MED ORDER — SIMVASTATIN 10 MG PO TABS: 10.0000 mg | ORAL_TABLET | Freq: Every day | ORAL | 3 refills | Status: DC

## 2018-07-20 MED ORDER — MONTELUKAST SODIUM 10 MG PO TABS: 10.0000 mg | ORAL_TABLET | Freq: Every day | ORAL | 3 refills | Status: DC

## 2018-07-20 MED ORDER — DICLOFENAC SODIUM 1 % TD GEL: 4.0000 g | Freq: Four times a day (QID) | TRANSDERMAL | 99 refills | Status: DC

## 2018-07-20 MED ORDER — ALPRAZOLAM 0.5 MG PO TABS: 0.5000 mg | ORAL_TABLET | Freq: Four times a day (QID) | ORAL | 3 refills | Status: DC

## 2018-07-20 NOTE — Telephone Encounter (Signed)
Medication sent to [facility] 

## 2018-07-23 ENCOUNTER — Other Ambulatory Visit: Payer: Self-pay

## 2018-07-23 MED ORDER — DIPHENOXYLATE-ATROPINE 2.5-0.025 MG PO TABS: ORAL_TABLET | ORAL | 3 refills | Status: DC

## 2018-07-23 MED ORDER — TRIAMTERENE-HCTZ 37.5-25 MG PO TABS: ORAL_TABLET | ORAL | 3 refills | Status: DC

## 2018-07-26 ENCOUNTER — Ambulatory Visit (INDEPENDENT_AMBULATORY_CARE_PROVIDER_SITE_OTHER): Payer: Medicare Other | Admitting: Family Medicine

## 2018-07-26 VITALS — BP 133/76 | HR 92 | Temp 98.3°F | Wt 142.0 lb

## 2018-07-26 DIAGNOSIS — N183 Chronic kidney disease, stage 3 unspecified: Secondary | ICD-10-CM

## 2018-07-26 DIAGNOSIS — I10 Essential (primary) hypertension: Secondary | ICD-10-CM

## 2018-07-26 DIAGNOSIS — M069 Rheumatoid arthritis, unspecified: Secondary | ICD-10-CM | POA: Diagnosis not present

## 2018-07-26 DIAGNOSIS — M797 Fibromyalgia: Secondary | ICD-10-CM

## 2018-07-26 DIAGNOSIS — M810 Age-related osteoporosis without current pathological fracture: Secondary | ICD-10-CM | POA: Diagnosis not present

## 2018-07-26 DIAGNOSIS — F331 Major depressive disorder, recurrent, moderate: Secondary | ICD-10-CM | POA: Diagnosis not present

## 2018-07-26 DIAGNOSIS — G894 Chronic pain syndrome: Secondary | ICD-10-CM | POA: Diagnosis not present

## 2018-07-26 MED ORDER — FUROSEMIDE 20 MG PO TABS: 20.0000 mg | ORAL_TABLET | Freq: Every day | ORAL | 1 refills | Status: AC | PRN

## 2018-07-26 MED ORDER — ONDANSETRON HCL 4 MG PO TABS: 4.0000 mg | ORAL_TABLET | Freq: Three times a day (TID) | ORAL | 1 refills | Status: DC | PRN

## 2018-07-26 MED ORDER — OXYCODONE HCL 10 MG PO TABS: ORAL_TABLET | ORAL | 0 refills | Status: DC

## 2018-07-26 MED ORDER — GABAPENTIN 100 MG PO CAPS: 100.0000 mg | ORAL_CAPSULE | Freq: Three times a day (TID) | ORAL | 3 refills | Status: DC

## 2018-07-26 MED ORDER — OMEPRAZOLE 20 MG PO CPDR: 20.0000 mg | DELAYED_RELEASE_CAPSULE | Freq: Every day | ORAL | 1 refills | Status: DC

## 2018-07-26 MED ORDER — DIPHENOXYLATE-ATROPINE 2.5-0.025 MG PO TABS: ORAL_TABLET | ORAL | 3 refills | Status: DC

## 2018-07-26 NOTE — Patient Instructions (Addendum)
Thank you for coming in today. Start gabapentin at bedtime for nerve pain.  Increase to 3x daily slowly.  Use zofran as needed.   Recheck in 1 month.   We are working to switch [name]'s medicine to [place].     Keep the dose of your pain and anxiety medicines as needed low if able.

## 2018-07-26 NOTE — Progress Notes (Signed)
[name] is a 84 y.o. female who presents to [place]: [place] today for chronic pain, hypertension, left foot paresthesias.  [name] has chronic pain and takes medications listed below.  She notes the oxycodone does allow her to function.  She denies severe side effects such as significant constipation or lethargy.  She has been taking his medication for some time.  Additionally she notes left foot numbness and tingling.  This was evaluated in the last visit.  This is thought to be paresthesias due to neuropathy.  She has had intolerances to gabapentin and Lyrica in the past.  She notes the gabapentin did cause a headache in the past.  She is willing to try it again however as her foot paresthesias are bothersome.  Additionally she takes medications listed below for hypertension.  No chest pain palpitations or shortness of breath.   ROS as above:  Exam:  BP 133/76   Pulse 92   Temp 98.3 F (36.8 C) (Oral)   Wt 142 lb (64.4 kg)   BMI 27.73 kg/m  Wt Readings from Last 5 Encounters:  07/26/18 142 lb (64.4 kg)  06/26/18 141 lb (64 kg)  05/29/18 141 lb (64 kg)  05/02/18 143 lb (64.9 kg)  04/04/18 146 lb (66.2 kg)    Gen: Well NAD HEENT: EOMI,  MMM Lungs: Normal work of breathing. CTABL Heart: RRR no MRG Abd: NABS, Soft. Nondistended, Nontender Exts: Brisk capillary refill, warm and well perfused.  Left foot normal motion.  Patient ambulates with a [name]. Spine: Significant thoracic kyphosis and cervical extension.  Nontender to midline tender palpation paraspinal musculature.  Lab and Radiology Results No results found for this or any previous visit (from the past 72 hour(s)). No results found.    Assessment and Plan: 84 y.o. female with  Chronic pain: Stable.  Continue current regimen.  Check urine drug screen yearly.  Medications refilled recheck in  1 month.  Paresthesias: Trial of low-dose gabapentin.  Recheck in 1 month.  Hypertension: Blood pressure controlled continue current regimen and recheck in 1 month.  Patient researched [facility] Controlled Substance Reporting System.  Orders Placed This Encounter  Procedures  . Pain Mgmt, Profile 4 Conf w/o mM, U     Historical information moved to improve visibility of documentation.  Past Medical History:  Diagnosis Date  . Anxiety   . Arthritis    osteoarthritis  . Asthma   . C. difficile diarrhea   . Candida esophagitis ([place])   . Chronic sinusitis   . Depression   . Depression   . Diverticulosis   . DJD (degenerative joint disease)     L5 compression fracture  . Edema extremities   . Fibromyalgia   . GERD (gastroesophageal reflux disease)   . Gout   . Hyperlipidemia   . Hypertension   . Osteoarthritis   . Osteopenia   . Panic disorder   . Renal failure, unspecified   . Spinal stenosis of lumbar region   . Tubular adenoma of colon   . Wrist fracture, left    Past Surgical History:  Procedure Laterality Date  . ADENOIDECTOMY    . CARPAL TUNNEL RELEASE  2007, 2009   Bilateral  . CATARACT EXTRACTION, BILATERAL    . CHOANAL ADENIODECTOMY    . DIAGNOSTIC LARYNGOSCOPY N/A 11/05/2015   Procedure: DIAGNOSTIC LARYNGOSCOPY AND ESOPHAGOSCOPY;  Surgeon: Rozetta Nunnery, MD;  Location: [facility];  Service: ENT;  Laterality: N/A;  . ganglion cyct removal on fingers     right  . ganglion cyst  wrist    . KNEE ARTHROSCOPY     right  . NASAL SINUS SURGERY    . OPEN REDUCTION INTERNAL FIXATION (ORIF) DISTAL RADIAL FRACTURE Left 12/18/2016   Procedure: OPEN REDUCTION INTERNAL FIXATION (ORIF) DISTAL RADIAL FRACTURE;  Surgeon: Iran Planas, MD;  Location: [facility];  Service: Orthopedics;  Laterality: Left;  . SHOULDER SURGERY  2004, 07/30/10, 01/2011   after her right humeral neck fracture, revision hemiarthroplasty 2004 for persistent pain, revision reverse arthroplasty  December 2011 for persistent pain, incision and drainage with poly-exchange 01/26/2011 for possible prosthetic joint infection.  . TONSILLECTOMY AND ADENOIDECTOMY    . TOTAL ABDOMINAL HYSTERECTOMY    . TOTAL HIP ARTHROPLASTY  2004   right  . TOTAL KNEE ARTHROPLASTY  2006   right  . TOTAL SHOULDER REPLACEMENT  2002   right   Social History   Tobacco Use  . Smoking status: Never Smoker  . Smokeless tobacco: Never Used  Substance Use Topics  . Alcohol use: No   family history includes Bladder Cancer in her father; Breast cancer in her unknown relative; Colon polyps in her unknown relative; Depression in her son; Diabetes in her mother; Hypertension in her mother.  Medications: Current Outpatient Medications  Medication Sig Dispense Refill  . albuterol (PROVENTIL HFA;VENTOLIN HFA) 108 (90 Base) MCG/ACT inhaler Inhale 2 puffs into the lungs every 6 (six) hours as needed for wheezing or shortness of breath. 1 Inhaler 0  . ALPRAZolam (XANAX) 0.5 MG tablet Take 1 tablet (0.5 mg total) by mouth 4 (four) times daily. 360 tablet 3  . Calcium Carbonate-Vitamin D (CALCIUM 600+D) 600-400 MG-UNIT tablet Take 1 tablet by mouth 2 (two) times daily. 90 tablet 3  . clotrimazole-betamethasone (LOTRISONE) cream Apply 1 application topically 2 (two) times daily. 45 g 1  . denosumab (PROLIA) 60 MG/ML SOSY injection Inject 60 mg into the skin every 6 (six) months.    . diclofenac sodium (VOLTAREN) 1 % GEL Apply 4 g topically 4 (four) times daily. To affected joint. 500 g 99  . diphenhydrAMINE (BENADRYL) 25 MG tablet Take 25 mg by mouth every 6 (six) hours as needed. For allergies    . diphenoxylate-atropine (LOMOTIL) 2.5-0.025 MG tablet One to 2 tablets by mouth 4 times a day as needed for diarrhea. 90 tablet 3  . fluticasone (FLONASE) 50 MCG/ACT nasal spray Place 2 sprays into both nostrils daily. 16 g 2  . furosemide (LASIX) 20 MG tablet Take 1 tablet (20 mg total) by mouth daily as needed for fluid. 30  tablet 0  . metroNIDAZOLE (METROGEL) 0.75 % gel Apply 1 application topically 2 (two) times daily. 45 g 3  . mirtazapine (REMERON) 30 MG tablet Take 1 tablet (30 mg total) by mouth at bedtime. 90 tablet 12  . montelukast (SINGULAIR) 10 MG tablet Take 1 tablet (10 mg total) by mouth at bedtime. 90 tablet 3  . mupirocin ointment (BACTROBAN) 2 % Apply to affected area TID for 7 days. 30 g 3  . omeprazole (PRILOSEC) 20 MG capsule TAKE 1 CAPSULE BY MOUTH ONCE DAILY 90 capsule 1  . ondansetron (ZOFRAN ODT) 4 MG disintegrating tablet Take 1 tablet (4 mg total) by mouth every 8 (eight) hours as needed for nausea or vomiting. 270 tablet 0  . ondansetron (ZOFRAN) 4 MG tablet Take 1 tablet (4 mg total) by mouth every 8 (eight) hours as  needed for nausea or vomiting. 20 tablet 0  . Oxycodone HCl 10 MG TABS Take 10mg  po 2-3x per day as needed for pain 70 tablet 0  . polyethylene glycol (MIRALAX / GLYCOLAX) packet Take 17 g by mouth daily. 14 each 0  . promethazine (PHENERGAN) 25 MG tablet Take 1 tablet (25 mg total) by mouth every 6 (six) hours as needed for nausea or vomiting. 30 tablet 2  . QUEtiapine (SEROQUEL) 25 MG tablet Take 1 tablet (25 mg total) by mouth at bedtime. For insomnia 30 tablet 0  . simvastatin (ZOCOR) 10 MG tablet Take 1 tablet (10 mg total) by mouth daily. 90 tablet 3  . triamterene-hydrochlorothiazide (MAXZIDE-25) 37.5-25 MG tablet TAKE 1 TABLET BY MOUTH EVERY DAY GENERIC EQUIVALENT FOR MAXZIDE-25 90 tablet 3  . TRINTELLIX 5 MG TABS tablet Take 1 tablet (5 mg total) by mouth daily. 90 tablet 3  . cetirizine (ZYRTEC) 10 MG tablet Take 1 tablet (10 mg total) by mouth daily. (Patient not taking: Reported on 07/26/2018) 30 tablet 11   No current facility-administered medications for this visit.    Allergies  Allergen Reactions  . Bee Venom Anaphylaxis    Has epi-pen  . Ceftin [Cefuroxime Axetil] Anaphylaxis  . Ciprofloxacin Anaphylaxis and Palpitations  . Ephedrine Other (See Comments)  and Palpitations    "knocks me out"  . Sulfonamide Derivatives Anaphylaxis  . Telithromycin Anaphylaxis    Reaction: also blurred vision   . Valsartan Anaphylaxis  . Verapamil Anaphylaxis  . Venlafaxine Other (See Comments)    Insomnia, panic  . Beta Adrenergic Blockers     bradycardia  . Cephalosporins     Allergic Reaction  . Clindamycin/Lincomycin     Dry skin and itching/ Cdiff  . Cymbalta [Duloxetine Hcl] Other (See Comments)    Reaction:Confusion and " I fall down"  . Doxazosin     "blurred vision and irritable"  . Gabapentin Other (See Comments)    Reaction: headache  . Hydralazine     HA, Diarrhea  . Lamotrigine     Patient unsure at this time  . Morphine Other (See Comments)    Medication has no effect with pain  . Oxycodone-Acetaminophen Hives    Takes plain oxy IR at home (May 2018)  . Pregabalin Swelling  . Prochlorperazine Edisylate   . Pseudoephedrine Other (See Comments)    Reaction: hyperventilates and blacks out  . Relafen [Nabumetone] Swelling  . Zoloft [Sertraline] Other (See Comments)    "nervous/jittery"  . Doxycycline Hives  . Wellbutrin [Bupropion] Other (See Comments)    Jittery     Discussed warning signs or symptoms. Please see discharge instructions. Patient expresses understanding.

## 2018-07-28 LAB — PAIN MGMT, PROFILE 4 CONF W/O MM, U
Alphahydroxyalprazolam: 261 ng/mL — ABNORMAL HIGH (ref <25)
Alphahydroxymidazolam: NEGATIVE ng/mL (ref <50)
Alphahydroxytriazolam: NEGATIVE ng/mL (ref <50)
Aminoclonazepam: NEGATIVE ng/mL (ref <25)
Amphetamines: NEGATIVE ng/mL (ref <500)
BARBITURATES: NEGATIVE ng/mL (ref <300)
BENZODIAZEPINES: POSITIVE ng/mL — AB (ref <100)
Cocaine Metabolite: NEGATIVE ng/mL (ref <150)
Codeine: NEGATIVE ng/mL (ref <50)
Creatinine: 62.6 mg/dL
HYDROCODONE: NEGATIVE ng/mL (ref <50)
HYDROMORPHONE: NEGATIVE ng/mL (ref <50)
Hydroxyethylflurazepam: NEGATIVE ng/mL (ref <50)
Lorazepam: NEGATIVE ng/mL (ref <50)
Methadone Metabolite: NEGATIVE ng/mL (ref <100)
Morphine: NEGATIVE ng/mL (ref <50)
NOROXYCODONE: 1319 ng/mL — AB (ref <50)
Nordiazepam: NEGATIVE ng/mL (ref <50)
Norhydrocodone: NEGATIVE ng/mL (ref <50)
OXAZEPAM: NEGATIVE ng/mL (ref <50)
OXYCODONE: POSITIVE ng/mL — AB (ref <100)
Opiates: NEGATIVE ng/mL (ref <100)
Oxidant: NEGATIVE ug/mL (ref <200)
Oxycodone: 266 ng/mL — ABNORMAL HIGH (ref <50)
Oxymorphone: 2262 ng/mL — ABNORMAL HIGH (ref <50)
Phencyclidine: NEGATIVE ng/mL (ref <25)
Temazepam: NEGATIVE ng/mL (ref <50)
pH: 6.85 (ref 4.5–9.0)

## 2018-08-03 ENCOUNTER — Encounter: Payer: Self-pay | Admitting: Family Medicine

## 2018-08-17 ENCOUNTER — Encounter: Payer: Self-pay | Admitting: Family Medicine

## 2018-08-17 ENCOUNTER — Ambulatory Visit (INDEPENDENT_AMBULATORY_CARE_PROVIDER_SITE_OTHER): Payer: Medicare Other | Admitting: Family Medicine

## 2018-08-17 VITALS — BP 126/75 | HR 91 | Wt 145.0 lb

## 2018-08-17 DIAGNOSIS — G8929 Other chronic pain: Secondary | ICD-10-CM

## 2018-08-17 DIAGNOSIS — F411 Generalized anxiety disorder: Secondary | ICD-10-CM

## 2018-08-17 DIAGNOSIS — M1712 Unilateral primary osteoarthritis, left knee: Secondary | ICD-10-CM

## 2018-08-17 DIAGNOSIS — F331 Major depressive disorder, recurrent, moderate: Secondary | ICD-10-CM

## 2018-08-17 DIAGNOSIS — G894 Chronic pain syndrome: Secondary | ICD-10-CM | POA: Diagnosis not present

## 2018-08-17 DIAGNOSIS — M25562 Pain in left knee: Secondary | ICD-10-CM | POA: Diagnosis not present

## 2018-08-17 MED ORDER — OXYCODONE HCL 10 MG PO TABS: ORAL_TABLET | ORAL | 0 refills | Status: DC

## 2018-08-17 MED ORDER — DICLOFENAC SODIUM 1 % TD GEL: 4.0000 g | Freq: Four times a day (QID) | TRANSDERMAL | 99 refills | Status: AC

## 2018-08-17 MED ORDER — GABAPENTIN 100 MG PO CAPS: 100.0000 mg | ORAL_CAPSULE | Freq: Three times a day (TID) | ORAL | 3 refills | Status: DC

## 2018-08-17 NOTE — Progress Notes (Signed)
[name] is a 85 y.o. female who presents to [place]: [place] today for discuss left knee pain, back pain, mood.  [name] has chronic pain due to significant degenerative disease of her cervical and thoracic and lumbar spine.  Her pain is managed with lifestyle changes including heating pad and activity and some exercises.  Additionally she has oxycodone that she is been taking for some time now.  She notes the oxycodone remains helpful and allows her to function and complete tasks around the house.  She denies significant obnoxious side effects such as abdominal pain or severe constipation.  She denies significant lethargy.  Additionally [name] notes left knee pain.  She has a history of left knee pain due to DJD.  The pain waxes and wanes at times.  She notes recently the pain is been worsening associated grinding sensation.  She denies locking or catching.  In the past she has had steroid injections which have helped.  She is a pertinent orthopedic history for right total knee replacement.  [name] notes that she continues to have difficulty with her anxiety and depression symptoms.  This is been ongoing for quite some time and she takes medications listed below which do allow her to function.  She is interested in pursuing some activities outside the house and is interested in the [facility] in [place].   ROS as above:  Exam:  BP 126/75   Pulse 91   Wt 145 lb (65.8 kg)   BMI 28.32 kg/m  Wt Readings from Last 5 Encounters:  08/17/18 145 lb (65.8 kg)  07/26/18 142 lb (64.4 kg)  06/26/18 141 lb (64 kg)  05/29/18 141 lb (64 kg)  05/02/18 143 lb (64.9 kg)    Gen: Well NAD HEENT: EOMI,  MMM Lungs: Normal work of breathing. CTABL Heart: RRR no MRG Abd: NABS, Soft. Nondistended, Nontender Exts: Brisk capillary refill, warm and well perfused.  Psych  alert and oriented normal speech thought process and affect.  No active SI or HI expressed.  Lab and Radiology Results Procedure: Real-time Ultrasound Guided Injection of left knee Device: GE Logiq E   Images permanently stored and available for review in the ultrasound unit. Verbal informed consent obtained.  Discussed risks and benefits of procedure. Warned about infection bleeding damage to structures skin hypopigmentation and fat atrophy among others. Patient expresses understanding and agreement Time-out conducted.   Noted no overlying erythema, induration, or other signs of local infection.   Skin prepped in a sterile fashion.   Local anesthesia: Topical Ethyl chloride.   With sterile technique and under real time ultrasound guidance:  80 mg of Depo-Medrol and 4 mL of Marcaine injected easily.   Completed without difficulty   Pain immediately resolved suggesting accurate placement of the medication.   Advised to call if fevers/chills, erythema, induration, drainage, or persistent bleeding.   Images permanently stored and available for review in the ultrasound unit.  Impression: Technically successful ultrasound guided injection.   X-ray images left knee from 2016 reviewed personally independently.  X-ray shows substantial degenerative changes throughout.    Assessment and Plan: 85 y.o. female with  Left knee pain.  DJD.  Proceed with injection today.  Additionally continue diclofenac gel.  New order prescribed.  Recheck in 1 month.  Chronic pain: Stable.  Continue current regimen including oxycodone.  Mood: Stable but not ideally controlled.  I think outside activities such as the [facility]  is a great idea. Recheck 1 month.  PDMP reviewed during this encounter. No orders of the defined types were placed in this encounter.  Meds ordered this encounter  Medications  . diclofenac sodium (VOLTAREN) 1 % GEL    Sig: Apply 4 g topically 4 (four) times daily. To affected  joint.    Dispense:  500 g    Refill:  99    OA knee. Failed ibuprofen and aleve  . gabapentin (NEURONTIN) 100 MG capsule    Sig: Take 1 capsule (100 mg total) by mouth 3 (three) times daily.    Dispense:  270 capsule    Refill:  3  . Oxycodone HCl 10 MG TABS    Sig: Take 10mg  po 2-3x per day as needed for pain. Fill on or after 08/26/18    Dispense:  70 tablet    Refill:  0     Historical information moved to improve visibility of documentation.  Past Medical History:  Diagnosis Date  . Anxiety   . Arthritis    osteoarthritis  . Asthma   . C. difficile diarrhea   . Candida esophagitis ([place])   . Chronic sinusitis   . Depression   . Depression   . Diverticulosis   . DJD (degenerative joint disease)     L5 compression fracture  . Edema extremities   . Fibromyalgia   . GERD (gastroesophageal reflux disease)   . Gout   . Hyperlipidemia   . Hypertension   . Osteoarthritis   . Osteopenia   . Panic disorder   . Renal failure, unspecified   . Spinal stenosis of lumbar region   . Tubular adenoma of colon   . Wrist fracture, left    Past Surgical History:  Procedure Laterality Date  . ADENOIDECTOMY    . CARPAL TUNNEL RELEASE  2007, 2009   Bilateral  . CATARACT EXTRACTION, BILATERAL    . CHOANAL ADENIODECTOMY    . DIAGNOSTIC LARYNGOSCOPY N/A 11/05/2015   Procedure: DIAGNOSTIC LARYNGOSCOPY AND ESOPHAGOSCOPY;  Surgeon: Rozetta Nunnery, MD;  Location: [facility];  Service: ENT;  Laterality: N/A;  . ganglion cyct removal on fingers     right  . ganglion cyst  wrist    . KNEE ARTHROSCOPY     right  . NASAL SINUS SURGERY    . OPEN REDUCTION INTERNAL FIXATION (ORIF) DISTAL RADIAL FRACTURE Left 12/18/2016   Procedure: OPEN REDUCTION INTERNAL FIXATION (ORIF) DISTAL RADIAL FRACTURE;  Surgeon: Iran Planas, MD;  Location: [place];  Service: Orthopedics;  Laterality: Left;  . SHOULDER SURGERY  2004, 07/30/10, 01/2011   after her right humeral neck fracture, revision hemiarthroplasty  2004 for persistent pain, revision reverse arthroplasty December 2011 for persistent pain, incision and drainage with poly-exchange 01/26/2011 for possible prosthetic joint infection.  . TONSILLECTOMY AND ADENOIDECTOMY    . TOTAL ABDOMINAL HYSTERECTOMY    . TOTAL HIP ARTHROPLASTY  2004   right  . TOTAL KNEE ARTHROPLASTY  2006   right  . TOTAL SHOULDER REPLACEMENT  2002   right   Social History   Tobacco Use  . Smoking status: Never Smoker  . Smokeless tobacco: Never Used  Substance Use Topics  . Alcohol use: No   family history includes Bladder Cancer in her father; Breast cancer in her unknown relative; Colon polyps in her unknown relative; Depression in her son; Diabetes in her mother; Hypertension in her mother.  Medications: Current Outpatient Medications  Medication Sig Dispense Refill  . albuterol (PROVENTIL  HFA;VENTOLIN HFA) 108 (90 Base) MCG/ACT inhaler Inhale 2 puffs into the lungs every 6 (six) hours as needed for wheezing or shortness of breath. 1 Inhaler 0  . ALPRAZolam (XANAX) 0.5 MG tablet Take 1 tablet (0.5 mg total) by mouth 4 (four) times daily. 360 tablet 3  . Calcium Carbonate-Vitamin D (CALCIUM 600+D) 600-400 MG-UNIT tablet Take 1 tablet by mouth 2 (two) times daily. 90 tablet 3  . cetirizine (ZYRTEC) 10 MG tablet Take 1 tablet (10 mg total) by mouth daily. 30 tablet 11  . clotrimazole-betamethasone (LOTRISONE) cream Apply 1 application topically 2 (two) times daily. 45 g 1  . denosumab (PROLIA) 60 MG/ML SOSY injection Inject 60 mg into the skin every 6 (six) months.    . diclofenac sodium (VOLTAREN) 1 % GEL Apply 4 g topically 4 (four) times daily. To affected joint. 500 g 99  . diphenhydrAMINE (BENADRYL) 25 MG tablet Take 25 mg by mouth every 6 (six) hours as needed. For allergies    . diphenoxylate-atropine (LOMOTIL) 2.5-0.025 MG tablet One to 2 tablets by mouth 4 times a day as needed for diarrhea. 90 tablet 3  . fluticasone (FLONASE) 50 MCG/ACT nasal spray  Place 2 sprays into both nostrils daily. 16 g 2  . furosemide (LASIX) 20 MG tablet Take 1 tablet (20 mg total) by mouth daily as needed for fluid. 90 tablet 1  . gabapentin (NEURONTIN) 100 MG capsule Take 1 capsule (100 mg total) by mouth 3 (three) times daily. 270 capsule 3  . metroNIDAZOLE (METROGEL) 0.75 % gel Apply 1 application topically 2 (two) times daily. 45 g 3  . mirtazapine (REMERON) 30 MG tablet Take 1 tablet (30 mg total) by mouth at bedtime. 90 tablet 12  . montelukast (SINGULAIR) 10 MG tablet Take 1 tablet (10 mg total) by mouth at bedtime. 90 tablet 3  . mupirocin ointment (BACTROBAN) 2 % Apply to affected area TID for 7 days. 30 g 3  . omeprazole (PRILOSEC) 20 MG capsule Take 1 capsule (20 mg total) by mouth daily. 90 capsule 1  . ondansetron (ZOFRAN ODT) 4 MG disintegrating tablet Take 1 tablet (4 mg total) by mouth every 8 (eight) hours as needed for nausea or vomiting. 270 tablet 0  . ondansetron (ZOFRAN) 4 MG tablet Take 1 tablet (4 mg total) by mouth every 8 (eight) hours as needed for nausea or vomiting. 90 tablet 1  . Oxycodone HCl 10 MG TABS Take 10mg  po 2-3x per day as needed for pain. Fill on or after 08/26/18 70 tablet 0  . polyethylene glycol (MIRALAX / GLYCOLAX) packet Take 17 g by mouth daily. 14 each 0  . promethazine (PHENERGAN) 25 MG tablet Take 1 tablet (25 mg total) by mouth every 6 (six) hours as needed for nausea or vomiting. 30 tablet 2  . QUEtiapine (SEROQUEL) 25 MG tablet Take 1 tablet (25 mg total) by mouth at bedtime. For insomnia 30 tablet 0  . simvastatin (ZOCOR) 10 MG tablet Take 1 tablet (10 mg total) by mouth daily. 90 tablet 3  . triamterene-hydrochlorothiazide (MAXZIDE-25) 37.5-25 MG tablet TAKE 1 TABLET BY MOUTH EVERY DAY GENERIC EQUIVALENT FOR MAXZIDE-25 90 tablet 3  . TRINTELLIX 5 MG TABS tablet Take 1 tablet (5 mg total) by mouth daily. 90 tablet 3   No current facility-administered medications for this visit.    Allergies  Allergen Reactions    . Bee Venom Anaphylaxis    Has epi-pen  . Ceftin [Cefuroxime Axetil] Anaphylaxis  . Ciprofloxacin  Anaphylaxis and Palpitations  . Ephedrine Other (See Comments) and Palpitations    "knocks me out"  . Sulfonamide Derivatives Anaphylaxis  . Telithromycin Anaphylaxis    Reaction: also blurred vision   . Valsartan Anaphylaxis  . Verapamil Anaphylaxis  . Venlafaxine Other (See Comments)    Insomnia, panic  . Beta Adrenergic Blockers     bradycardia  . Cephalosporins     Allergic Reaction  . Clindamycin/Lincomycin     Dry skin and itching/ Cdiff  . Cymbalta [Duloxetine Hcl] Other (See Comments)    Reaction:Confusion and " I fall down"  . Doxazosin     "blurred vision and irritable"  . Hydralazine     HA, Diarrhea  . Lamotrigine     Patient unsure at this time  . Morphine Other (See Comments)    Medication has no effect with pain  . Pregabalin Swelling  . Prochlorperazine Edisylate   . Pseudoephedrine Other (See Comments)    Reaction: hyperventilates and blacks out  . Relafen [Nabumetone] Swelling  . Zoloft [Sertraline] Other (See Comments)    "nervous/jittery"  . Doxycycline Hives  . Wellbutrin [Bupropion] Other (See Comments)    Jittery     Discussed warning signs or symptoms. Please see discharge instructions. Patient expresses understanding.

## 2018-08-17 NOTE — Telephone Encounter (Signed)
This medication or product is on your plan's list of covered drugs. Prior authorization is not required at this time. If your pharmacy has questions regarding the processing of your prescription, please have them call the [facility] pharmacy help desk at [phone]. Pharmacy aware.

## 2018-08-17 NOTE — Patient Instructions (Signed)
Thank you for coming in today. Call or go to the ER if you develop a large red swollen joint with extreme pain or oozing puss.  Continue current treatment.  We will get the voltaren gel authorized.  Recheck in 1 month as scheduled with Lynnae Sandhoff.

## 2018-08-20 ENCOUNTER — Telehealth: Payer: Self-pay

## 2018-08-20 NOTE — Telephone Encounter (Signed)
Per insurance no tier exception is available for this medication and no PA required. See other note.

## 2018-08-20 NOTE — Telephone Encounter (Signed)
[name] called and states she needs [place] to do a tier exception for Voltaren 1 % gel. [phone]

## 2018-08-21 ENCOUNTER — Telehealth: Payer: Self-pay | Admitting: Family Medicine

## 2018-08-21 NOTE — Telephone Encounter (Signed)
I called patient to let her know that I have sent a tier exception to insurance and waiting on a response. Patient is aware.

## 2018-08-21 NOTE — Telephone Encounter (Signed)
Routing for completion

## 2018-08-21 NOTE — Telephone Encounter (Signed)
Ms. Billard called. She states, request for PA was sent for Diclofenac Sodium but  what she needs is a request for Tier reduction from Tier 3 to a Tier 2.  The number to call is [phone].  Thanks! Mardene Celeste

## 2018-08-22 NOTE — Telephone Encounter (Signed)
Responded to fax yesterday.

## 2018-08-22 NOTE — Telephone Encounter (Signed)
Thank you :)

## 2018-08-24 ENCOUNTER — Ambulatory Visit: Payer: Self-pay | Admitting: Family Medicine

## 2018-08-26 ENCOUNTER — Encounter: Payer: Self-pay | Admitting: Family Medicine

## 2018-08-27 NOTE — Telephone Encounter (Signed)
Diclofenac Gel has been approved through 08/01/2019. Pharmacy aware and form sent to scan. Patient is aware.

## 2018-09-24 ENCOUNTER — Ambulatory Visit (INDEPENDENT_AMBULATORY_CARE_PROVIDER_SITE_OTHER): Payer: Medicare Other | Admitting: Family Medicine

## 2018-09-24 ENCOUNTER — Encounter: Payer: Self-pay | Admitting: Family Medicine

## 2018-09-24 VITALS — BP 147/77 | HR 99 | Wt 148.0 lb

## 2018-09-24 DIAGNOSIS — M25532 Pain in left wrist: Secondary | ICD-10-CM | POA: Diagnosis not present

## 2018-09-24 DIAGNOSIS — F411 Generalized anxiety disorder: Secondary | ICD-10-CM

## 2018-09-24 DIAGNOSIS — F331 Major depressive disorder, recurrent, moderate: Secondary | ICD-10-CM

## 2018-09-24 DIAGNOSIS — I10 Essential (primary) hypertension: Secondary | ICD-10-CM | POA: Diagnosis not present

## 2018-09-24 DIAGNOSIS — G894 Chronic pain syndrome: Secondary | ICD-10-CM | POA: Diagnosis not present

## 2018-09-24 MED ORDER — EPINEPHRINE 0.3 MG/0.3ML IJ SOAJ: 0.3000 mg | INTRAMUSCULAR | 0 refills | Status: DC | PRN

## 2018-09-24 MED ORDER — OXYCODONE HCL 10 MG PO TABS: ORAL_TABLET | ORAL | 0 refills | Status: DC

## 2018-09-24 NOTE — Progress Notes (Signed)
[name] is a 85 y.o. female who presents to [place]: [place] today for chronic pain, mood hypertension.  [name] continues to struggle with mood.  She notes increased stress due to her husband's poor health and living situation.  He takes medications listed below.  She notes that she does not feel like that she has admitted to talk to however has difficulty accessing outside activities due to limited transportation and finances.  She is tried contacting several resources around town notes that there is no good options.  Additionally she has chronic pain and takes oxycodone listed below.  She takes typically 2 pills a day sometimes a half a pill extra.  She notes the medicine does allow for some pain control but not sufficient completely all the time.  She notes her wrists and upper back typically bother her the most.  She notes that she has a history of wrist fracture and has been using a wrist brace that has since been worn out and would like a new wrist brace if possible.  Additionally she has hypertension and takes medications listed below.  No chest pain palpitations shortness of breath.    ROS as above:  Exam:  BP (!) 147/77   Pulse 99   Wt 148 lb (67.1 kg)   BMI 28.90 kg/m  Wt Readings from Last 5 Encounters:  09/24/18 148 lb (67.1 kg)  08/17/18 145 lb (65.8 kg)  07/26/18 142 lb (64.4 kg)  06/26/18 141 lb (64 kg)  05/29/18 141 lb (64 kg)    Gen: Well NAD HEENT: EOMI,  MMM Lungs: Normal work of breathing. CTABL Heart: RRR no MRG Abd: NABS, Soft. Nondistended, Nontender Exts: Brisk capillary refill, warm and well perfused.  Left wrist: Mild effusion no severe deformity.  Mildly tender to palpation decreased range of motion.  Pulses capillary fill and strength are intact distally. Spine: Significant thoracic kyphosis and cervical lordosis.  Nontender  spinal midline.  Tender palpation paraspinal musculature. Psych alert and oriented normal speech thought process and affect.  Lab and Radiology Results No results found for this or any previous visit (from the past 72 hour(s)). No results found.    Assessment and Plan: 85 y.o. female with  Chronic pain: Reasonably well controlled.  Plan to re-dispense a wrist brace.  Refill oxycodone recheck 1 month.  Hypertension: Reasonably controlled.  Continue current regimen.  Recheck 1 month.  Mood: Not well controlled.  Patient has been on multiple different medications and this is probably the best regimen she has been on care.  Life is a little bit extra stressful right now which is likely the cause of her worsening.  Discussed problem solving and options.  Plan to continue current regimen recheck in 1 month.  PDMP reviewed during this encounter. No orders of the defined types were placed in this encounter.  Meds ordered this encounter  Medications  . Oxycodone HCl 10 MG TABS    Sig: Take 10mg  po 2-3x per day as needed for pain. Fill on or after 09/26/18    Dispense:  70 tablet    Refill:  0  . EPINEPHrine 0.3 mg/0.3 mL IJ SOAJ injection    Sig: Inject 0.3 mLs (0.3 mg total) into the muscle as needed for anaphylaxis.    Dispense:  1 Device    Refill:  0     Historical information moved to improve visibility of documentation.  Past Medical History:  Diagnosis Date  . Anxiety   . Arthritis    osteoarthritis  . Asthma   . C. difficile diarrhea   . Candida esophagitis ([place])   . Chronic sinusitis   . Depression   . Depression   . Diverticulosis   . DJD (degenerative joint disease)     L5 compression fracture  . Edema extremities   . Fibromyalgia   . GERD (gastroesophageal reflux disease)   . Gout   . Hyperlipidemia   . Hypertension   . Osteoarthritis   . Osteopenia   . Panic disorder   . Renal failure, unspecified   . Spinal stenosis of lumbar region   . Tubular adenoma  of colon   . Wrist fracture, left    Past Surgical History:  Procedure Laterality Date  . ADENOIDECTOMY    . CARPAL TUNNEL RELEASE  2007, 2009   Bilateral  . CATARACT EXTRACTION, BILATERAL    . CHOANAL ADENIODECTOMY    . DIAGNOSTIC LARYNGOSCOPY N/A 11/05/2015   Procedure: DIAGNOSTIC LARYNGOSCOPY AND ESOPHAGOSCOPY;  Surgeon: Carrie Nunnery, MD;  Location: [facility];  Service: ENT;  Laterality: N/A;  . ganglion cyct removal on fingers     right  . ganglion cyst  wrist    . KNEE ARTHROSCOPY     right  . NASAL SINUS SURGERY    . OPEN REDUCTION INTERNAL FIXATION (ORIF) DISTAL RADIAL FRACTURE Left 12/18/2016   Procedure: OPEN REDUCTION INTERNAL FIXATION (ORIF) DISTAL RADIAL FRACTURE;  Surgeon: Carrie Planas, MD;  Location: [place];  Service: Orthopedics;  Laterality: Left;  . SHOULDER SURGERY  2004, 07/30/10, 01/2011   after her right humeral neck fracture, revision hemiarthroplasty 2004 for persistent pain, revision reverse arthroplasty December 2011 for persistent pain, incision and drainage with poly-exchange 01/26/2011 for possible prosthetic joint infection.  . TONSILLECTOMY AND ADENOIDECTOMY    . TOTAL ABDOMINAL HYSTERECTOMY    . TOTAL HIP ARTHROPLASTY  2004   right  . TOTAL KNEE ARTHROPLASTY  2006   right  . TOTAL SHOULDER REPLACEMENT  2002   right   Social History   Tobacco Use  . Smoking status: Never Smoker  . Smokeless tobacco: Never Used  Substance Use Topics  . Alcohol use: No   family history includes Bladder Cancer in her father; Breast cancer in her unknown relative; Colon polyps in her unknown relative; Depression in her son; Diabetes in her mother; Hypertension in her mother.  Medications: Current Outpatient Medications  Medication Sig Dispense Refill  . albuterol (PROVENTIL HFA;VENTOLIN HFA) 108 (90 Base) MCG/ACT inhaler Inhale 2 puffs into the lungs every 6 (six) hours as needed for wheezing or shortness of breath. 1 Inhaler 0  . ALPRAZolam (XANAX) 0.5 MG  tablet Take 1 tablet (0.5 mg total) by mouth 4 (four) times daily. 360 tablet 3  . Calcium Carbonate-Vitamin D (CALCIUM 600+D) 600-400 MG-UNIT tablet Take 1 tablet by mouth 2 (two) times daily. 90 tablet 3  . cetirizine (ZYRTEC) 10 MG tablet Take 1 tablet (10 mg total) by mouth daily. 30 tablet 11  . clotrimazole-betamethasone (LOTRISONE) cream Apply 1 application topically 2 (two) times daily. 45 g 1  . denosumab (PROLIA) 60 MG/ML SOSY injection Inject 60 mg into the skin every 6 (six) months.    . diclofenac sodium (VOLTAREN) 1 % GEL Apply 4 g topically 4 (four) times daily. To affected joint. 500 g 99  . diphenhydrAMINE (BENADRYL) 25 MG tablet Take 25 mg by mouth every 6 (six) hours as needed. For  allergies    . diphenoxylate-atropine (LOMOTIL) 2.5-0.025 MG tablet One to 2 tablets by mouth 4 times a day as needed for diarrhea. 90 tablet 3  . EPINEPHrine 0.3 mg/0.3 mL IJ SOAJ injection Inject 0.3 mLs (0.3 mg total) into the muscle as needed for anaphylaxis. 1 Device 0  . fluticasone (FLONASE) 50 MCG/ACT nasal spray Place 2 sprays into both nostrils daily. 16 g 2  . furosemide (LASIX) 20 MG tablet Take 1 tablet (20 mg total) by mouth daily as needed for fluid. 90 tablet 1  . gabapentin (NEURONTIN) 100 MG capsule Take 1 capsule (100 mg total) by mouth 3 (three) times daily. 270 capsule 3  . metroNIDAZOLE (METROGEL) 0.75 % gel Apply 1 application topically 2 (two) times daily. 45 g 3  . mirtazapine (REMERON) 30 MG tablet Take 1 tablet (30 mg total) by mouth at bedtime. 90 tablet 12  . montelukast (SINGULAIR) 10 MG tablet Take 1 tablet (10 mg total) by mouth at bedtime. 90 tablet 3  . mupirocin ointment (BACTROBAN) 2 % Apply to affected area TID for 7 days. 30 g 3  . omeprazole (PRILOSEC) 20 MG capsule Take 1 capsule (20 mg total) by mouth daily. 90 capsule 1  . ondansetron (ZOFRAN ODT) 4 MG disintegrating tablet Take 1 tablet (4 mg total) by mouth every 8 (eight) hours as needed for nausea or  vomiting. 270 tablet 0  . ondansetron (ZOFRAN) 4 MG tablet Take 1 tablet (4 mg total) by mouth every 8 (eight) hours as needed for nausea or vomiting. 90 tablet 1  . Oxycodone HCl 10 MG TABS Take 10mg  po 2-3x per day as needed for pain. Fill on or after 09/26/18 70 tablet 0  . polyethylene glycol (MIRALAX / GLYCOLAX) packet Take 17 g by mouth daily. 14 each 0  . promethazine (PHENERGAN) 25 MG tablet Take 1 tablet (25 mg total) by mouth every 6 (six) hours as needed for nausea or vomiting. 30 tablet 2  . QUEtiapine (SEROQUEL) 25 MG tablet Take 1 tablet (25 mg total) by mouth at bedtime. For insomnia 30 tablet 0  . simvastatin (ZOCOR) 10 MG tablet Take 1 tablet (10 mg total) by mouth daily. 90 tablet 3  . triamterene-hydrochlorothiazide (MAXZIDE-25) 37.5-25 MG tablet TAKE 1 TABLET BY MOUTH EVERY DAY GENERIC EQUIVALENT FOR MAXZIDE-25 90 tablet 3  . TRINTELLIX 5 MG TABS tablet Take 1 tablet (5 mg total) by mouth daily. 90 tablet 3   No current facility-administered medications for this visit.    Allergies  Allergen Reactions  . Bee Venom Anaphylaxis    Has epi-pen  . Ceftin [Cefuroxime Axetil] Anaphylaxis  . Ciprofloxacin Anaphylaxis and Palpitations  . Ephedrine Other (See Comments) and Palpitations    "knocks me out"  . Sulfonamide Derivatives Anaphylaxis  . Telithromycin Anaphylaxis    Reaction: also blurred vision   . Valsartan Anaphylaxis  . Verapamil Anaphylaxis  . Venlafaxine Other (See Comments)    Insomnia, panic  . Beta Adrenergic Blockers     bradycardia  . Cephalosporins     Allergic Reaction  . Clindamycin/Lincomycin     Dry skin and itching/ Cdiff  . Cymbalta [Duloxetine Hcl] Other (See Comments)    Reaction:Confusion and " I fall down"  . Doxazosin     "blurred vision and irritable"  . Hydralazine     HA, Diarrhea  . Lamotrigine     Patient unsure at this time  . Morphine Other (See Comments)    Medication has no  effect with pain  . Pregabalin Swelling  .  Prochlorperazine Edisylate   . Pseudoephedrine Other (See Comments)    Reaction: hyperventilates and blacks out  . Relafen [Nabumetone] Swelling  . Zoloft [Sertraline] Other (See Comments)    "nervous/jittery"  . Doxycycline Hives  . Wellbutrin [Bupropion] Other (See Comments)    Jittery     Discussed warning signs or symptoms. Please see discharge instructions. Patient expresses understanding.

## 2018-09-24 NOTE — Patient Instructions (Addendum)
Thank you for coming in today. Please continue medicines for pain as needed.  Recheck with me in 1 month or so with [name].   Continue looking to get into assisted living.   We will consider steroid injection for the left wrist.

## 2018-10-01 ENCOUNTER — Telehealth: Payer: Self-pay

## 2018-10-01 NOTE — Telephone Encounter (Signed)
Prima states the Epinephrine is a high tier and she needs it reduce to a lower tier.

## 2018-10-02 NOTE — Telephone Encounter (Signed)
Per insurance no tier exception or PA is needed. From has been sent to scan. Patient is aware.

## 2018-10-15 ENCOUNTER — Encounter: Payer: Self-pay | Admitting: Family Medicine

## 2018-10-16 ENCOUNTER — Telehealth: Payer: Self-pay

## 2018-10-16 NOTE — Telephone Encounter (Signed)
Left pt detailed msg of providers recommendations

## 2018-10-16 NOTE — Telephone Encounter (Signed)
I think it is probably safer to avoid coming to the doctor's office if possible.  Let us wait a bit until we can set up a time for not sick but vulnerable people to be seen at the doctor's office.  It is okay if we have to wait a bit.

## 2018-10-16 NOTE — Telephone Encounter (Signed)
Pt calls very concerned about being past-due for her Prolia.   Pt states she was due on March 4th ans is very conflicted because her kids told her not to leave the house.   Patient would need to come in and have labs drawn and then come in again for her Prolia.[name] please advise on what we should recommend for patient. She is very anxious about having to come in, but also very anxious about missing her injection.

## 2018-10-22 ENCOUNTER — Telehealth (INDEPENDENT_AMBULATORY_CARE_PROVIDER_SITE_OTHER): Payer: Medicare Other | Admitting: Family Medicine

## 2018-10-22 DIAGNOSIS — M069 Rheumatoid arthritis, unspecified: Secondary | ICD-10-CM

## 2018-10-22 DIAGNOSIS — G894 Chronic pain syndrome: Secondary | ICD-10-CM | POA: Diagnosis not present

## 2018-10-22 MED ORDER — OXYCODONE HCL 10 MG PO TABS: ORAL_TABLET | ORAL | 0 refills | Status: DC

## 2018-10-22 NOTE — Telephone Encounter (Signed)
[name] called today.  She notes that she will need a refill of her oxycodone.  She cannot leave the house due to the COVID-19 pandemic.  She notes her pain is about as controlled as it has been previously.  She tolerates the medication well.  We will send medicines to mail order pharmacy.  PDMP reviewed during this encounter.  Total time spent 5 minutes.

## 2018-10-23 ENCOUNTER — Encounter: Payer: Self-pay | Admitting: Family Medicine

## 2018-10-29 ENCOUNTER — Other Ambulatory Visit: Payer: Self-pay

## 2018-10-29 ENCOUNTER — Ambulatory Visit: Payer: Self-pay | Admitting: Family Medicine

## 2018-10-29 MED ORDER — ALPRAZOLAM 0.5 MG PO TABS: 0.5000 mg | ORAL_TABLET | Freq: Four times a day (QID) | ORAL | 0 refills | Status: DC

## 2018-10-29 NOTE — Telephone Encounter (Signed)
[name] states she has not received the Xanax from [facility]. She states she will need it sent to the local pharmacy, [facility] pharmacy. Please advise.

## 2018-11-15 ENCOUNTER — Encounter (INDEPENDENT_AMBULATORY_CARE_PROVIDER_SITE_OTHER): Payer: Medicare Other | Admitting: Family Medicine

## 2018-11-15 DIAGNOSIS — M810 Age-related osteoporosis without current pathological fracture: Secondary | ICD-10-CM

## 2018-11-16 MED ORDER — ALENDRONATE SODIUM 70 MG PO TABS: 70.0000 mg | ORAL_TABLET | ORAL | 3 refills | Status: DC

## 2018-11-16 NOTE — Telephone Encounter (Signed)
Patient contacted me regarding her osteoporosis.  She previously was receiving Prolia injections every 6 months.  However due to COVID-19 pandemic the risk of visit in the office for the labs required to administer Prolia safely as well as the Prolia injection is now higher.  After discussion with patient will use oral Fosamax.  Medication sent to mail order pharmacy.  We will continue to follow and likely try switching back to Prolia in the future as I think it is going to be a superior osteoporosis medication for her.  Total time spent 11 minutes.

## 2018-11-17 ENCOUNTER — Other Ambulatory Visit: Payer: Self-pay | Admitting: Family Medicine

## 2018-12-11 ENCOUNTER — Ambulatory Visit (INDEPENDENT_AMBULATORY_CARE_PROVIDER_SITE_OTHER): Payer: Medicare Other | Admitting: Family Medicine

## 2018-12-11 ENCOUNTER — Telehealth: Payer: Self-pay | Admitting: Family Medicine

## 2018-12-11 VITALS — BP 116/73 | HR 80

## 2018-12-11 DIAGNOSIS — G894 Chronic pain syndrome: Secondary | ICD-10-CM

## 2018-12-11 DIAGNOSIS — F411 Generalized anxiety disorder: Secondary | ICD-10-CM

## 2018-12-11 DIAGNOSIS — I10 Essential (primary) hypertension: Secondary | ICD-10-CM

## 2018-12-11 DIAGNOSIS — N183 Chronic kidney disease, stage 3 unspecified: Secondary | ICD-10-CM

## 2018-12-11 DIAGNOSIS — F331 Major depressive disorder, recurrent, moderate: Secondary | ICD-10-CM

## 2018-12-11 MED ORDER — QUETIAPINE FUMARATE 25 MG PO TABS: 25.0000 mg | ORAL_TABLET | Freq: Every day | ORAL | 1 refills | Status: DC

## 2018-12-11 MED ORDER — IPRATROPIUM BROMIDE 0.06 % NA SOLN: 2.0000 | NASAL | 6 refills | Status: DC | PRN

## 2018-12-11 NOTE — Progress Notes (Signed)
Virtual Visit  I connected with      [name]  by a telemedicine application and verified that I am speaking with the correct person using two identifiers.   I discussed the limitations of evaluation and management by telemedicine and the availability of in person appointments. The patient expressed understanding and agreed to proceed.  History of Present Illness: [name] is a 85 y.o. female who would like to discuss insomnia and mood.    Insomnia: [name] has had problems with anxiety in the past.  She has an extensive history of multiple different medication trials.  She had some significant agitation in the past and in August 2019 was prescribed Seroquel.  She notes that it has had significant benefit however she needs a refill.  She notes that she typically is able to fall asleep and stay asleep for a few weeks of go back to sleep with Seroquel.  She is pretty happy with how it goes.  Runny nose: Additionally she notes a bothersome runny nose.  She tried allergy medications which have not helped.  She notes she is willing to try some nasal spray.  [name] also notes multiple other pain complaints.  She uses oxycodone for chronic pain control.  She notes this works pretty well. Observations/Objective: BP 116/73   Pulse 80  Wt Readings from Last 5 Encounters:  09/24/18 148 lb (67.1 kg)  08/17/18 145 lb (65.8 kg)  07/26/18 142 lb (64.4 kg)  06/26/18 141 lb (64 kg)  05/29/18 141 lb (64 kg)   Exam: Normal Speech.  Psych: Alert and oriented normal speech thought process and affect.  Depression screen [facility] 2/9 12/11/2018 06/26/2018 06/26/2018 03/12/2018 02/14/2018  Decreased Interest 3 3 2 3 3   Down, Depressed, Hopeless 3 3 2 3 3   PHQ - 2 Score 6 6 4 6 6   Altered sleeping 3 1 2 3 2   Tired, decreased energy 3 3 2 3 2   Change in appetite 3 1 2 2 2   Feeling bad or failure about yourself  3 2 2 3 2   Trouble concentrating 2 0 2 3 1   Moving slowly or fidgety/restless 0  0 1 - 0  Suicidal thoughts 0 0 1 0 0  PHQ-9 Score 20 13 16 20 15   Difficult doing work/chores Somewhat difficult Very difficult Somewhat difficult Somewhat difficult Somewhat difficult  Some recent data might be hidden   GAD 7 : Generalized Anxiety Score 12/11/2018 03/12/2018 02/14/2018 01/26/2018  Nervous, Anxious, on Edge 3 3 3 3   Control/stop worrying 3 3 3 3   Worry too much - different things 3 3 3 3   Trouble relaxing 3 3 3 3   Restless 3 0 1 1  Easily annoyed or irritable 2 3 2 3   Afraid - awful might happen 0 3 3 3   Total GAD 7 Score 17 18 18 19   Anxiety Difficulty Somewhat difficult Somewhat difficult Somewhat difficult Very difficult  Some encounter information is confidential and restricted. Go to Review Flowsheets activity to see all data.     Lab and Radiology Results No results found for this or any previous visit (from the past 72 hour(s)). No results found.   Assessment and Plan: 85 y.o. female with  Insomnia: Doing reasonably well.  Continue Seroquel.  Continue otherwise anxiety control as well.  Recheck in near future.  We will try to arrange for home visit.  Runny nose: Likely vasomotor rhinitis.  Trial of Atrovent nasal spray.  Chronic  pain: Doing well refill oxycodone when needed.  PDMP not reviewed this encounter. No orders of the defined types were placed in this encounter.  Meds ordered this encounter  Medications  . QUEtiapine (SEROQUEL) 25 MG tablet    Sig: Take 1 tablet (25 mg total) by mouth at bedtime. For insomnia    Dispense:  90 tablet    Refill:  1  . ipratropium (ATROVENT) 0.06 % nasal spray    Sig: Place 2 sprays into both nostrils every 4 (four) hours as needed.    Dispense:  30 mL    Refill:  6    Follow Up Instructions:    I discussed the assessment and treatment plan with the patient. The patient was provided an opportunity to ask questions and all were answered. The patient agreed with the plan and demonstrated an understanding of  the instructions.   The patient was advised to call back or seek an in-person evaluation if the symptoms worsen or if the condition fails to improve as anticipated.  Time: 25 minutes of intraservice time, with >39 minutes of total time during today's visit.      Historical information moved to improve visibility of documentation.  Past Medical History:  Diagnosis Date  . Anxiety   . Arthritis    osteoarthritis  . Asthma   . C. difficile diarrhea   . Candida esophagitis ([place])   . Chronic sinusitis   . Depression   . Depression   . Diverticulosis   . DJD (degenerative joint disease)     L5 compression fracture  . Edema extremities   . Fibromyalgia   . GERD (gastroesophageal reflux disease)   . Gout   . Hyperlipidemia   . Hypertension   . Osteoarthritis   . Osteopenia   . Panic disorder   . Renal failure, unspecified   . Spinal stenosis of lumbar region   . Tubular adenoma of colon   . Wrist fracture, left    Past Surgical History:  Procedure Laterality Date  . ADENOIDECTOMY    . CARPAL TUNNEL RELEASE  2007, 2009   Bilateral  . CATARACT EXTRACTION, BILATERAL    . CHOANAL ADENIODECTOMY    . DIAGNOSTIC LARYNGOSCOPY N/A 11/05/2015   Procedure: DIAGNOSTIC LARYNGOSCOPY AND ESOPHAGOSCOPY;  Surgeon: Rozetta Nunnery, MD;  Location: [facility];  Service: ENT;  Laterality: N/A;  . ganglion cyct removal on fingers     right  . ganglion cyst  wrist    . KNEE ARTHROSCOPY     right  . NASAL SINUS SURGERY    . OPEN REDUCTION INTERNAL FIXATION (ORIF) DISTAL RADIAL FRACTURE Left 12/18/2016   Procedure: OPEN REDUCTION INTERNAL FIXATION (ORIF) DISTAL RADIAL FRACTURE;  Surgeon: Iran Planas, MD;  Location: [place];  Service: Orthopedics;  Laterality: Left;  . SHOULDER SURGERY  2004, 07/30/10, 01/2011   after her right humeral neck fracture, revision hemiarthroplasty 2004 for persistent pain, revision reverse arthroplasty December 2011 for persistent pain, incision and drainage with  poly-exchange 01/26/2011 for possible prosthetic joint infection.  . TONSILLECTOMY AND ADENOIDECTOMY    . TOTAL ABDOMINAL HYSTERECTOMY    . TOTAL HIP ARTHROPLASTY  2004   right  . TOTAL KNEE ARTHROPLASTY  2006   right  . TOTAL SHOULDER REPLACEMENT  2002   right   Social History   Tobacco Use  . Smoking status: Never Smoker  . Smokeless tobacco: Never Used  Substance Use Topics  . Alcohol use: No   family history includes Bladder  Cancer in her father; Breast cancer in her unknown relative; Colon polyps in her unknown relative; Depression in her son; Diabetes in her mother; Hypertension in her mother.  Medications: Current Outpatient Medications  Medication Sig Dispense Refill  . albuterol (PROVENTIL HFA;VENTOLIN HFA) 108 (90 Base) MCG/ACT inhaler Inhale 2 puffs into the lungs every 6 (six) hours as needed for wheezing or shortness of breath. 1 Inhaler 0  . alendronate (FOSAMAX) 70 MG tablet Take 1 tablet (70 mg total) by mouth every 7 (seven) days. Take with a full glass of water on an empty stomach. 12 tablet 3  . ALPRAZolam (XANAX) 0.5 MG tablet Take 1 tablet (0.5 mg total) by mouth 4 (four) times daily. 360 tablet 0  . Calcium Carbonate-Vitamin D (CALCIUM 600+D) 600-400 MG-UNIT tablet Take 1 tablet by mouth 2 (two) times daily. 90 tablet 3  . cetirizine (ZYRTEC) 10 MG tablet Take 1 tablet (10 mg total) by mouth daily. 30 tablet 11  . denosumab (PROLIA) 60 MG/ML SOSY injection Inject 60 mg into the skin every 6 (six) months.    . diphenhydrAMINE (BENADRYL) 25 MG tablet Take 25 mg by mouth every 6 (six) hours as needed. For allergies    . diphenoxylate-atropine (LOMOTIL) 2.5-0.025 MG tablet One to 2 tablets by mouth 4 times a day as needed for diarrhea. 90 tablet 3  . EPINEPHrine 0.3 mg/0.3 mL IJ SOAJ injection Inject 0.3 mLs (0.3 mg total) into the muscle as needed for anaphylaxis. 1 Device 0  . fluticasone (FLONASE) 50 MCG/ACT nasal spray Place 2 sprays into both nostrils daily. 16  g 2  . furosemide (LASIX) 20 MG tablet Take 1 tablet (20 mg total) by mouth daily as needed for fluid. 90 tablet 1  . gabapentin (NEURONTIN) 100 MG capsule Take 1 capsule (100 mg total) by mouth 3 (three) times daily. 270 capsule 3  . metroNIDAZOLE (METROGEL) 0.75 % gel Apply 1 application topically 2 (two) times daily. 45 g 3  . mirtazapine (REMERON) 30 MG tablet Take 1 tablet (30 mg total) by mouth at bedtime. 90 tablet 12  . montelukast (SINGULAIR) 10 MG tablet Take 1 tablet (10 mg total) by mouth at bedtime. 90 tablet 3  . mupirocin ointment (BACTROBAN) 2 % Apply to affected area TID for 7 days. 30 g 3  . omeprazole (PRILOSEC) 20 MG capsule TAKE 1 CAPSULE BY MOUTH  DAILY 90 capsule 1  . ondansetron (ZOFRAN-ODT) 4 MG disintegrating tablet DISSOLVE 1 TABLET ON THE  TONGUE EVERY 8 HOURS AS  NEEDED FOR NAUSEA AND  VOMITING 270 tablet 0  . Oxycodone HCl 10 MG TABS Take 10mg  po 2-3x per day as needed for pain. 210 tablet 0  . promethazine (PHENERGAN) 25 MG tablet Take 1 tablet (25 mg total) by mouth every 6 (six) hours as needed for nausea or vomiting. 30 tablet 2  . QUEtiapine (SEROQUEL) 25 MG tablet Take 1 tablet (25 mg total) by mouth at bedtime. For insomnia 90 tablet 1  . simvastatin (ZOCOR) 10 MG tablet Take 1 tablet (10 mg total) by mouth daily. 90 tablet 3  . triamterene-hydrochlorothiazide (MAXZIDE-25) 37.5-25 MG tablet TAKE 1 TABLET BY MOUTH EVERY DAY GENERIC EQUIVALENT FOR MAXZIDE-25 90 tablet 3  . TRINTELLIX 5 MG TABS tablet Take 1 tablet (5 mg total) by mouth daily. 90 tablet 3  . diclofenac sodium (VOLTAREN) 1 % GEL Apply 4 g topically 4 (four) times daily. To affected joint. 500 g 99  . ipratropium (ATROVENT) 0.06 %  nasal spray Place 2 sprays into both nostrils every 4 (four) hours as needed. 30 mL 6  . ondansetron (ZOFRAN) 4 MG tablet Take 1 tablet (4 mg total) by mouth every 8 (eight) hours as needed for nausea or vomiting. 90 tablet 1  . polyethylene glycol (MIRALAX / GLYCOLAX)  packet Take 17 g by mouth daily. 14 each 0   No current facility-administered medications for this visit.    Allergies  Allergen Reactions  . Bee Venom Anaphylaxis    Has epi-pen  . Ceftin [Cefuroxime Axetil] Anaphylaxis  . Ciprofloxacin Anaphylaxis and Palpitations  . Ephedrine Other (See Comments) and Palpitations    "knocks me out"  . Sulfonamide Derivatives Anaphylaxis  . Telithromycin Anaphylaxis    Reaction: also blurred vision   . Valsartan Anaphylaxis  . Verapamil Anaphylaxis  . Venlafaxine Other (See Comments)    Insomnia, panic  . Beta Adrenergic Blockers     bradycardia  . Cephalosporins     Allergic Reaction  . Clindamycin/Lincomycin     Dry skin and itching/ Cdiff  . Cymbalta [Duloxetine Hcl] Other (See Comments)    Reaction:Confusion and " I fall down"  . Doxazosin     "blurred vision and irritable"  . Hydralazine     HA, Diarrhea  . Lamotrigine     Patient unsure at this time  . Morphine Other (See Comments)    Medication has no effect with pain  . Pregabalin Swelling  . Prochlorperazine Edisylate   . Pseudoephedrine Other (See Comments)    Reaction: hyperventilates and blacks out  . Relafen [Nabumetone] Swelling  . Zoloft [Sertraline] Other (See Comments)    "nervous/jittery"  . Doxycycline Hives  . Wellbutrin [Bupropion] Other (See Comments)    Jittery

## 2018-12-11 NOTE — Telephone Encounter (Signed)
Left VM for Pt to return clinic call to schedule.  

## 2018-12-11 NOTE — Telephone Encounter (Signed)
Let schedule phone or video visit as early as today to review her sleep medication options.

## 2018-12-11 NOTE — Telephone Encounter (Signed)
Pt called clinic requesting something to help her sleep. States she has been taking the Seroquel but it is not helping. Requesting something be sent to [facility]. Routing.

## 2018-12-12 ENCOUNTER — Telehealth: Payer: Self-pay | Admitting: Family Medicine

## 2018-12-12 DIAGNOSIS — N183 Chronic kidney disease, stage 3 unspecified: Secondary | ICD-10-CM

## 2018-12-12 DIAGNOSIS — I10 Essential (primary) hypertension: Secondary | ICD-10-CM

## 2018-12-12 DIAGNOSIS — R790 Abnormal level of blood mineral: Secondary | ICD-10-CM

## 2018-12-12 DIAGNOSIS — E785 Hyperlipidemia, unspecified: Secondary | ICD-10-CM

## 2018-12-12 NOTE — Telephone Encounter (Signed)
scheduled

## 2018-12-12 NOTE — Telephone Encounter (Signed)
Obtaining lab order prior to home visit next week.

## 2018-12-12 NOTE — Telephone Encounter (Signed)
Correction next Friday May 22 not this Friday

## 2018-12-12 NOTE — Telephone Encounter (Signed)
Yes I guess add them at 1 PM and 1:30 PM for home visit on the 22nd.

## 2018-12-12 NOTE — Telephone Encounter (Signed)
Pt advised, she is OK with the date of 12/21/18 at 1300. Do you want them added to your schedule?

## 2018-12-12 NOTE — Telephone Encounter (Signed)
Please ask [facility] if this Friday at 1 PM would be okay for a home visit with both her and her husband?  Plan to get labs at that time and also proceed with knee injection for her husband if needed.

## 2018-12-21 ENCOUNTER — Encounter: Payer: Self-pay | Admitting: Family Medicine

## 2018-12-21 ENCOUNTER — Ambulatory Visit: Payer: Medicare Other | Admitting: Family Medicine

## 2018-12-21 VITALS — HR 70

## 2018-12-21 DIAGNOSIS — F419 Anxiety disorder, unspecified: Secondary | ICD-10-CM

## 2018-12-21 DIAGNOSIS — G894 Chronic pain syndrome: Secondary | ICD-10-CM | POA: Diagnosis not present

## 2018-12-21 DIAGNOSIS — F329 Major depressive disorder, single episode, unspecified: Secondary | ICD-10-CM

## 2018-12-21 DIAGNOSIS — I10 Essential (primary) hypertension: Secondary | ICD-10-CM | POA: Diagnosis not present

## 2018-12-21 DIAGNOSIS — F331 Major depressive disorder, recurrent, moderate: Secondary | ICD-10-CM

## 2018-12-21 DIAGNOSIS — R790 Abnormal level of blood mineral: Secondary | ICD-10-CM | POA: Diagnosis not present

## 2018-12-21 DIAGNOSIS — N183 Chronic kidney disease, stage 3 (moderate): Secondary | ICD-10-CM | POA: Diagnosis not present

## 2018-12-21 DIAGNOSIS — E785 Hyperlipidemia, unspecified: Secondary | ICD-10-CM | POA: Diagnosis not present

## 2018-12-21 MED ORDER — OXYCODONE HCL 10 MG PO TABS: ORAL_TABLET | ORAL | 0 refills | Status: DC

## 2018-12-21 NOTE — Progress Notes (Signed)
[name] is a 85 y.o. female who seen today as part of a home visit for:  Chronic pain.  Shelbylynn has a history of chronic pain due to severe spinal DDD as well as rheumatoid arthritis.  This is typically managed with oxycodone.  She is been on a stable dose now which works pretty well.  She tolerates the medication well without not adverse side effects.  She would like to continue the current regimen if possible.  She notes she like a prescription sent to [facility] as they do home delivery now.  Additionally she has severe anxiety and depressive symptoms.  These have been ongoing for years and have continued to be bothersome.  She has rainouts especially bad because of COVID-19.  She is effectively homebound.  She is interested in telemedicine therapy if possible.  Additionally she needs phlebotomy.  Labs were ordered at the last telephone visit. ROS as above:  Exam:  Hr 70 Wt Readings from Last 5 Encounters:  09/24/18 148 lb (67.1 kg)  08/17/18 145 lb (65.8 kg)  07/26/18 142 lb (64.4 kg)  06/26/18 141 lb (64 kg)  05/29/18 141 lb (64 kg)    Gen: Well NAD HEENT: EOMI,  MMM Lungs: Normal work of breathing. CTABL Heart: RRR no MRG Abd: NABS, Soft. Nondistended, Nontender Exts: Brisk capillary refill, warm and well perfused.  MSK: Thoracic kyphosis and cervical lordosis present.  Mildly tender palpation along spinal midline and paraspinal musculature.  Decreased back motion.  Slow gait Psych alert and oriented normal speech thought process and affect.  No SI or HI expressed.     Assessment and Plan: 85 y.o. female with  Chronic pain: Stable unchanged.  Continue oxycodone.  New medication sent today.  Anxiety and depression: Chronic ongoing.  Patient has had multiple different medication attempts.  I think she basically is her maximum tolerated medication.  I think therapy at this point will be  very helpful.  Plan to refer to licensed clinical social worker for therapy.  She should be able to do telemedicine which should be helpful.  PDMP reviewed during this encounter. Orders Placed This Encounter  Procedures  . Ambulatory referral to Behavioral Health    Referral Priority:   Routine    Referral Type:   Psychiatric    Referral Reason:   Specialty Services Required    Requested Specialty:   Behavioral Health    Number of Visits Requested:   1   Meds ordered this encounter  Medications  . Oxycodone HCl 10 MG TABS    Sig: Take 10mg  po 2-3x per day as needed for pain.    Dispense:  210 tablet    Refill:  0    Mail order. Cant leave house due to COVID-19     Historical information moved to improve visibility of documentation.  Past Medical History:  Diagnosis Date  . Anxiety   . Arthritis    osteoarthritis  . Asthma   . C. difficile diarrhea   . Candida esophagitis ([place])   . Chronic sinusitis   . Depression   . Depression   . Diverticulosis   . DJD (degenerative joint disease)     L5 compression fracture  . Edema extremities   . Fibromyalgia   . GERD (gastroesophageal reflux disease)   . Gout   . Hyperlipidemia   . Hypertension   . Osteoarthritis   . Osteopenia   . Panic disorder   . Renal  failure, unspecified   . Spinal stenosis of lumbar region   . Tubular adenoma of colon   . Wrist fracture, left    Past Surgical History:  Procedure Laterality Date  . ADENOIDECTOMY    . CARPAL TUNNEL RELEASE  2007, 2009   Bilateral  . CATARACT EXTRACTION, BILATERAL    . CHOANAL ADENIODECTOMY    . DIAGNOSTIC LARYNGOSCOPY N/A 11/05/2015   Procedure: DIAGNOSTIC LARYNGOSCOPY AND ESOPHAGOSCOPY;  Surgeon: Rozetta Nunnery, MD;  Location: [facility];  Service: ENT;  Laterality: N/A;  . ganglion cyct removal on fingers     right  . ganglion cyst  wrist    . KNEE ARTHROSCOPY     right  . NASAL SINUS SURGERY    . OPEN REDUCTION INTERNAL FIXATION (ORIF) DISTAL RADIAL  FRACTURE Left 12/18/2016   Procedure: OPEN REDUCTION INTERNAL FIXATION (ORIF) DISTAL RADIAL FRACTURE;  Surgeon: Iran Planas, MD;  Location: [facility];  Service: Orthopedics;  Laterality: Left;  . SHOULDER SURGERY  2004, 07/30/10, 01/2011   after her right humeral neck fracture, revision hemiarthroplasty 2004 for persistent pain, revision reverse arthroplasty December 2011 for persistent pain, incision and drainage with poly-exchange 01/26/2011 for possible prosthetic joint infection.  . TONSILLECTOMY AND ADENOIDECTOMY    . TOTAL ABDOMINAL HYSTERECTOMY    . TOTAL HIP ARTHROPLASTY  2004   right  . TOTAL KNEE ARTHROPLASTY  2006   right  . TOTAL SHOULDER REPLACEMENT  2002   right   Social History   Tobacco Use  . Smoking status: Never Smoker  . Smokeless tobacco: Never Used  Substance Use Topics  . Alcohol use: No   family history includes Bladder Cancer in her father; Breast cancer in her unknown relative; Colon polyps in her unknown relative; Depression in her son; Diabetes in her mother; Hypertension in her mother.  Medications: Current Outpatient Medications  Medication Sig Dispense Refill  . albuterol (PROVENTIL HFA;VENTOLIN HFA) 108 (90 Base) MCG/ACT inhaler Inhale 2 puffs into the lungs every 6 (six) hours as needed for wheezing or shortness of breath. 1 Inhaler 0  . alendronate (FOSAMAX) 70 MG tablet Take 1 tablet (70 mg total) by mouth every 7 (seven) days. Take with a full glass of water on an empty stomach. 12 tablet 3  . ALPRAZolam (XANAX) 0.5 MG tablet Take 1 tablet (0.5 mg total) by mouth 4 (four) times daily. 360 tablet 0  . Calcium Carbonate-Vitamin D (CALCIUM 600+D) 600-400 MG-UNIT tablet Take 1 tablet by mouth 2 (two) times daily. 90 tablet 3  . cetirizine (ZYRTEC) 10 MG tablet Take 1 tablet (10 mg total) by mouth daily. 30 tablet 11  . denosumab (PROLIA) 60 MG/ML SOSY injection Inject 60 mg into the skin every 6 (six) months.    . diclofenac sodium (VOLTAREN) 1 % GEL Apply  4 g topically 4 (four) times daily. To affected joint. 500 g 99  . diphenhydrAMINE (BENADRYL) 25 MG tablet Take 25 mg by mouth every 6 (six) hours as needed. For allergies    . diphenoxylate-atropine (LOMOTIL) 2.5-0.025 MG tablet One to 2 tablets by mouth 4 times a day as needed for diarrhea. 90 tablet 3  . EPINEPHrine 0.3 mg/0.3 mL IJ SOAJ injection Inject 0.3 mLs (0.3 mg total) into the muscle as needed for anaphylaxis. 1 Device 0  . fluticasone (FLONASE) 50 MCG/ACT nasal spray Place 2 sprays into both nostrils daily. 16 g 2  . furosemide (LASIX) 20 MG tablet Take 1 tablet (20 mg total) by mouth daily  as needed for fluid. 90 tablet 1  . gabapentin (NEURONTIN) 100 MG capsule Take 1 capsule (100 mg total) by mouth 3 (three) times daily. 270 capsule 3  . ipratropium (ATROVENT) 0.06 % nasal spray Place 2 sprays into both nostrils every 4 (four) hours as needed. 30 mL 6  . metroNIDAZOLE (METROGEL) 0.75 % gel Apply 1 application topically 2 (two) times daily. 45 g 3  . mirtazapine (REMERON) 30 MG tablet Take 1 tablet (30 mg total) by mouth at bedtime. 90 tablet 12  . montelukast (SINGULAIR) 10 MG tablet Take 1 tablet (10 mg total) by mouth at bedtime. 90 tablet 3  . mupirocin ointment (BACTROBAN) 2 % Apply to affected area TID for 7 days. 30 g 3  . omeprazole (PRILOSEC) 20 MG capsule TAKE 1 CAPSULE BY MOUTH  DAILY 90 capsule 1  . ondansetron (ZOFRAN) 4 MG tablet Take 1 tablet (4 mg total) by mouth every 8 (eight) hours as needed for nausea or vomiting. 90 tablet 1  . ondansetron (ZOFRAN-ODT) 4 MG disintegrating tablet DISSOLVE 1 TABLET ON THE  TONGUE EVERY 8 HOURS AS  NEEDED FOR NAUSEA AND  VOMITING 270 tablet 0  . Oxycodone HCl 10 MG TABS Take 10mg  po 2-3x per day as needed for pain. 210 tablet 0  . polyethylene glycol (MIRALAX / GLYCOLAX) packet Take 17 g by mouth daily. 14 each 0  . promethazine (PHENERGAN) 25 MG tablet Take 1 tablet (25 mg total) by mouth every 6 (six) hours as needed for nausea or  vomiting. 30 tablet 2  . QUEtiapine (SEROQUEL) 25 MG tablet Take 1 tablet (25 mg total) by mouth at bedtime. For insomnia 90 tablet 1  . simvastatin (ZOCOR) 10 MG tablet Take 1 tablet (10 mg total) by mouth daily. 90 tablet 3  . triamterene-hydrochlorothiazide (MAXZIDE-25) 37.5-25 MG tablet TAKE 1 TABLET BY MOUTH EVERY DAY GENERIC EQUIVALENT FOR MAXZIDE-25 90 tablet 3  . TRINTELLIX 5 MG TABS tablet Take 1 tablet (5 mg total) by mouth daily. 90 tablet 3   No current facility-administered medications for this visit.    Allergies  Allergen Reactions  . Bee Venom Anaphylaxis    Has epi-pen  . Ceftin [Cefuroxime Axetil] Anaphylaxis  . Ciprofloxacin Anaphylaxis and Palpitations  . Ephedrine Other (See Comments) and Palpitations    "knocks me out"  . Sulfonamide Derivatives Anaphylaxis  . Telithromycin Anaphylaxis    Reaction: also blurred vision   . Valsartan Anaphylaxis  . Verapamil Anaphylaxis  . Venlafaxine Other (See Comments)    Insomnia, panic  . Beta Adrenergic Blockers     bradycardia  . Cephalosporins     Allergic Reaction  . Clindamycin/Lincomycin     Dry skin and itching/ Cdiff  . Cymbalta [Duloxetine Hcl] Other (See Comments)    Reaction:Confusion and " I fall down"  . Doxazosin     "blurred vision and irritable"  . Hydralazine     HA, Diarrhea  . Lamotrigine     Patient unsure at this time  . Morphine Other (See Comments)    Medication has no effect with pain  . Pregabalin Swelling  . Prochlorperazine Edisylate   . Pseudoephedrine Other (See Comments)    Reaction: hyperventilates and blacks out  . Relafen [Nabumetone] Swelling  . Zoloft [Sertraline] Other (See Comments)    "nervous/jittery"  . Doxycycline Hives  . Wellbutrin [Bupropion] Other (See Comments)    Jittery     Discussed warning signs or symptoms. Please see discharge instructions. Patient  expresses understanding.

## 2018-12-22 LAB — COMPLETE METABOLIC PANEL WITH GFR
AG Ratio: 1.6 (calc) (ref 1.0–2.5)
ALT: 16 U/L (ref 6–29)
AST: 24 U/L (ref 10–35)
Albumin: 4.1 g/dL (ref 3.6–5.1)
Alkaline phosphatase (APISO): 100 U/L (ref 37–153)
BUN/Creatinine Ratio: 14 (calc) (ref 6–22)
BUN: 15 mg/dL (ref 7–25)
CO2: 31 mmol/L (ref 20–32)
Calcium: 9.8 mg/dL (ref 8.6–10.4)
Chloride: 93 mmol/L — ABNORMAL LOW (ref 98–110)
Creat: 1.09 mg/dL — ABNORMAL HIGH (ref 0.60–0.88)
GFR, Est African American: 54 mL/min/{1.73_m2} — ABNORMAL LOW (ref >=60)
GFR, Est Non African American: 47 mL/min/{1.73_m2} — ABNORMAL LOW (ref >=60)
Globulin: 2.6 g/dL (calc) (ref 1.9–3.7)
Glucose, Bld: 87 mg/dL (ref 65–99)
Potassium: 5.1 mmol/L (ref 3.5–5.3)
Sodium: 132 mmol/L — ABNORMAL LOW (ref 135–146)
Total Bilirubin: 0.4 mg/dL (ref 0.2–1.2)
Total Protein: 6.7 g/dL (ref 6.1–8.1)

## 2018-12-22 LAB — CBC
HCT: 38.9 % (ref 35.0–45.0)
Hemoglobin: 12.9 g/dL (ref 11.7–15.5)
MCH: 29.7 pg (ref 27.0–33.0)
MCHC: 33.2 g/dL (ref 32.0–36.0)
MCV: 89.6 fL (ref 80.0–100.0)
MPV: 10.8 fL (ref 7.5–12.5)
Platelets: 275 10*3/uL (ref 140–400)
RBC: 4.34 10*6/uL (ref 3.80–5.10)
RDW: 12.5 % (ref 11.0–15.0)
WBC: 8 10*3/uL (ref 3.8–10.8)

## 2018-12-22 LAB — MAGNESIUM: Magnesium: 1.7 mg/dL (ref 1.5–2.5)

## 2018-12-22 LAB — LDL CHOLESTEROL, DIRECT: Direct LDL: 60 mg/dL (ref <100)

## 2019-01-17 ENCOUNTER — Ambulatory Visit (INDEPENDENT_AMBULATORY_CARE_PROVIDER_SITE_OTHER): Payer: Medicare Other | Admitting: Psychology

## 2019-01-17 DIAGNOSIS — F339 Major depressive disorder, recurrent, unspecified: Secondary | ICD-10-CM | POA: Diagnosis not present

## 2019-01-17 DIAGNOSIS — F419 Anxiety disorder, unspecified: Secondary | ICD-10-CM

## 2019-01-25 ENCOUNTER — Other Ambulatory Visit: Payer: Self-pay | Admitting: Family Medicine

## 2019-01-31 ENCOUNTER — Telehealth: Payer: Self-pay | Admitting: Family Medicine

## 2019-01-31 ENCOUNTER — Other Ambulatory Visit: Payer: Self-pay

## 2019-01-31 MED ORDER — ALENDRONATE SODIUM 70 MG PO TABS: 70.0000 mg | ORAL_TABLET | ORAL | 0 refills | Status: AC

## 2019-01-31 NOTE — Telephone Encounter (Signed)
Should we schedule medicare wellness for [facility]? Thanks

## 2019-01-31 NOTE — Telephone Encounter (Signed)
That should go to kim.

## 2019-02-06 ENCOUNTER — Telehealth: Payer: Self-pay

## 2019-02-06 ENCOUNTER — Telehealth: Payer: Self-pay | Admitting: Neurology

## 2019-02-06 MED ORDER — OXYCODONE HCL 10 MG PO TABS: ORAL_TABLET | ORAL | 0 refills | Status: DC

## 2019-02-06 MED ORDER — NYSTATIN 100000 UNIT/GM EX CREA: 1.0000 "application " | TOPICAL_CREAM | Freq: Two times a day (BID) | CUTANEOUS | 12 refills | Status: AC

## 2019-02-06 MED ORDER — NYSTATIN 100000 UNIT/GM EX POWD: Freq: Four times a day (QID) | CUTANEOUS | 3 refills | Status: AC

## 2019-02-06 NOTE — Telephone Encounter (Signed)
Patient advised.

## 2019-02-06 NOTE — Telephone Encounter (Signed)
Patient states she needs the Nystatin cream along with the powder (uses cream in groin area, powder under breasts). RX sent. Ok per Dr. Georgina Snell.

## 2019-02-06 NOTE — Telephone Encounter (Signed)
[name] called and left a message requesting nystatin power or cream for rash in groin area. She also requested a refill on the Oxycodone. I did call the pharmacy and confirmed that the insurance would only fill a 30 day of the last prescription. A refill for 30 day needs to be sent in.

## 2019-02-06 NOTE — Telephone Encounter (Signed)
Nystatin powder sent to pharmacy.  Oxycodone refilled to pharmacy.

## 2019-02-08 ENCOUNTER — Other Ambulatory Visit: Payer: Self-pay | Admitting: Family Medicine

## 2019-02-13 ENCOUNTER — Encounter: Payer: Self-pay | Admitting: Family Medicine

## 2019-02-15 ENCOUNTER — Ambulatory Visit (INDEPENDENT_AMBULATORY_CARE_PROVIDER_SITE_OTHER): Payer: Medicare Other | Admitting: Psychology

## 2019-02-15 DIAGNOSIS — F339 Major depressive disorder, recurrent, unspecified: Secondary | ICD-10-CM | POA: Diagnosis not present

## 2019-02-15 DIAGNOSIS — F419 Anxiety disorder, unspecified: Secondary | ICD-10-CM

## 2019-02-28 ENCOUNTER — Ambulatory Visit (INDEPENDENT_AMBULATORY_CARE_PROVIDER_SITE_OTHER): Payer: Medicare Other | Admitting: Psychology

## 2019-02-28 DIAGNOSIS — F419 Anxiety disorder, unspecified: Secondary | ICD-10-CM | POA: Diagnosis not present

## 2019-02-28 DIAGNOSIS — F331 Major depressive disorder, recurrent, moderate: Secondary | ICD-10-CM | POA: Diagnosis not present

## 2019-03-05 ENCOUNTER — Ambulatory Visit (INDEPENDENT_AMBULATORY_CARE_PROVIDER_SITE_OTHER): Payer: Medicare Other | Admitting: Family Medicine

## 2019-03-05 DIAGNOSIS — N183 Chronic kidney disease, stage 3 unspecified: Secondary | ICD-10-CM

## 2019-03-05 DIAGNOSIS — G894 Chronic pain syndrome: Secondary | ICD-10-CM

## 2019-03-05 DIAGNOSIS — M545 Low back pain, unspecified: Secondary | ICD-10-CM

## 2019-03-05 DIAGNOSIS — I10 Essential (primary) hypertension: Secondary | ICD-10-CM | POA: Diagnosis not present

## 2019-03-05 DIAGNOSIS — R7303 Prediabetes: Secondary | ICD-10-CM | POA: Diagnosis not present

## 2019-03-05 DIAGNOSIS — F331 Major depressive disorder, recurrent, moderate: Secondary | ICD-10-CM

## 2019-03-05 DIAGNOSIS — K219 Gastro-esophageal reflux disease without esophagitis: Secondary | ICD-10-CM

## 2019-03-05 DIAGNOSIS — F411 Generalized anxiety disorder: Secondary | ICD-10-CM

## 2019-03-05 MED ORDER — OXYCODONE HCL 10 MG PO TABS: ORAL_TABLET | ORAL | 0 refills | Status: DC

## 2019-03-05 NOTE — Patient Instructions (Signed)
Thank you for coming in today.  Continue counseling for mood.  Keep me updated.   Continue medicines.   Recheck as scheduled with phone call in October 6th at 8:10.   I am happy about your church.

## 2019-03-05 NOTE — Progress Notes (Signed)
Virtual Visit  I connected with      [name]  by a telemedicine application and verified that I am speaking with the correct person using two identifiers.   I discussed the limitations of evaluation and management by telemedicine and the availability of in person appointments. The patient expressed understanding and agreed to proceed.  History of Present Illness: [name] is a 85 y.o. female who would like to discuss mood.  [name]'s husband [name] died a few weeks ago on hospice.  This was not unexpected but she is still obviously dealing with grief and try to figure out what the rest of her life looks like.  Additionally [name] husband and her sister-in-law also died however unexpectedly about a week later.  She notes that she is struggling a bit.  She continues to have her chronic anxiety and depression symptoms that are about unchanged.  She does receive counseling which she finds helpful.  Additionally she has joined a new church which has [employer] which she does find to be helpful.  She notes however that transportation and the simple logistics of living remain somewhat challenging.  She does not drive.  The majority of her family live in the [place] or [place] area and she will probably think about moving out there at some point in the near future.  However for now she wants to normalize a bit at home here in [place].     Observations/Objective: There were no vitals taken for this visit. Wt Readings from Last 5 Encounters:  09/24/18 148 lb (67.1 kg)  08/17/18 145 lb (65.8 kg)  07/26/18 142 lb (64.4 kg)  06/26/18 141 lb (64 kg)  05/29/18 141 lb (64 kg)   Exam: Normal Speech.  Psych alert and oriented normal speech thought process and affect.  Lab and Radiology Results No results found for this or any previous visit (from the past 72 hour(s)). No results found.   Assessment and Plan: 85 y.o. female with  Anxiety and depression  and grief.  Dealing with the passing of her husband and her sister-in-law.  Try to figure out what her new life would look like.  Recommend continuing counseling continue medication.  Continue chronic pain medication.  Check back in about 2 months.  We will schedule phone visit for October 6.  Likely will drive out and give her a flu vaccine a Friday afternoon in October after discussion during her follow-up visit with me on the 6th  PDMP reviewed during this encounter. No orders of the defined types were placed in this encounter.  Meds ordered this encounter  Medications  . Oxycodone HCl 10 MG TABS    Sig: Take 10mg  po 2-3x per day as needed for pain.    Dispense:  90 tablet    Refill:  0    Mail order. Cant leave house due to COVID-19    Follow Up Instructions:    I discussed the assessment and treatment plan with the patient. The patient was provided an opportunity to ask questions and all were answered. The patient agreed with the plan and demonstrated an understanding of the instructions.   The patient was advised to call back or seek an in-person evaluation if the symptoms worsen or if the condition fails to improve as anticipated.  Time: 15 minutes of intraservice time, with >22 minutes of total time during today's visit.      Historical information moved to improve visibility of documentation.  Past Medical History:  Diagnosis Date  . Anxiety   . Arthritis    osteoarthritis  . Asthma   . C. difficile diarrhea   . Candida esophagitis ([place])   . Chronic sinusitis   . Depression   . Depression   . Diverticulosis   . DJD (degenerative joint disease)     L5 compression fracture  . Edema extremities   . Fibromyalgia   . GERD (gastroesophageal reflux disease)   . Gout   . Hyperlipidemia   . Hypertension   . Osteoarthritis   . Osteopenia   . Panic disorder   . Renal failure, unspecified   . Spinal stenosis of lumbar region   . Tubular adenoma of colon   . Wrist  fracture, left    Past Surgical History:  Procedure Laterality Date  . ADENOIDECTOMY    . CARPAL TUNNEL RELEASE  2007, 2009   Bilateral  . CATARACT EXTRACTION, BILATERAL    . CHOANAL ADENIODECTOMY    . DIAGNOSTIC LARYNGOSCOPY N/A 11/05/2015   Procedure: DIAGNOSTIC LARYNGOSCOPY AND ESOPHAGOSCOPY;  Surgeon: Rozetta Nunnery, MD;  Location: [facility];  Service: ENT;  Laterality: N/A;  . ganglion cyct removal on fingers     right  . ganglion cyst  wrist    . KNEE ARTHROSCOPY     right  . NASAL SINUS SURGERY    . OPEN REDUCTION INTERNAL FIXATION (ORIF) DISTAL RADIAL FRACTURE Left 12/18/2016   Procedure: OPEN REDUCTION INTERNAL FIXATION (ORIF) DISTAL RADIAL FRACTURE;  Surgeon: Iran Planas, MD;  Location: [facility];  Service: Orthopedics;  Laterality: Left;  . SHOULDER SURGERY  2004, 07/30/10, 01/2011   after her right humeral neck fracture, revision hemiarthroplasty 2004 for persistent pain, revision reverse arthroplasty December 2011 for persistent pain, incision and drainage with poly-exchange 01/26/2011 for possible prosthetic joint infection.  . TONSILLECTOMY AND ADENOIDECTOMY    . TOTAL ABDOMINAL HYSTERECTOMY    . TOTAL HIP ARTHROPLASTY  2004   right  . TOTAL KNEE ARTHROPLASTY  2006   right  . TOTAL SHOULDER REPLACEMENT  2002   right   Social History   Tobacco Use  . Smoking status: Never Smoker  . Smokeless tobacco: Never Used  Substance Use Topics  . Alcohol use: No   family history includes Bladder Cancer in her father; Breast cancer in her unknown relative; Colon polyps in her unknown relative; Depression in her son; Diabetes in her mother; Hypertension in her mother.  Medications: Current Outpatient Medications  Medication Sig Dispense Refill  . albuterol (PROVENTIL HFA;VENTOLIN HFA) 108 (90 Base) MCG/ACT inhaler Inhale 2 puffs into the lungs every 6 (six) hours as needed for wheezing or shortness of breath. 1 Inhaler 0  . alendronate (FOSAMAX) 70 MG tablet Take 1 tablet  (70 mg total) by mouth every 7 (seven) days. Take with a full glass of water on an empty stomach. 12 tablet 0  . ALPRAZolam (XANAX) 0.5 MG tablet Take 1 tablet (0.5 mg total) by mouth 4 (four) times daily. 360 tablet 0  . Calcium Carbonate-Vitamin D (CALCIUM 600+D) 600-400 MG-UNIT tablet Take 1 tablet by mouth 2 (two) times daily. 90 tablet 3  . cetirizine (ZYRTEC) 10 MG tablet Take 1 tablet (10 mg total) by mouth daily. 30 tablet 11  . diclofenac sodium (VOLTAREN) 1 % GEL Apply 4 g topically 4 (four) times daily. To affected joint. 500 g 99  . diphenhydrAMINE (BENADRYL) 25 MG tablet Take 25 mg by mouth every 6 (six) hours as needed.  For allergies    . diphenoxylate-atropine (LOMOTIL) 2.5-0.025 MG tablet One to 2 tablets by mouth 4 times a day as needed for diarrhea. 90 tablet 3  . EPINEPHrine 0.3 mg/0.3 mL IJ SOAJ injection Inject 0.3 mLs (0.3 mg total) into the muscle as needed for anaphylaxis. 1 Device 0  . fluticasone (FLONASE) 50 MCG/ACT nasal spray Place 2 sprays into both nostrils daily. 16 g 2  . furosemide (LASIX) 20 MG tablet Take 1 tablet (20 mg total) by mouth daily as needed for fluid. 90 tablet 1  . gabapentin (NEURONTIN) 100 MG capsule Take 1 capsule (100 mg total) by mouth 3 (three) times daily. 270 capsule 3  . ipratropium (ATROVENT) 0.06 % nasal spray USE 2 SPRAYS IN BOTH  NOSTRILS EVERY 4 HOURS AS  NEEDED 210 mL 1  . metroNIDAZOLE (METROGEL) 0.75 % gel Apply 1 application topically 2 (two) times daily. 45 g 3  . mirtazapine (REMERON) 30 MG tablet Take 1 tablet (30 mg total) by mouth at bedtime. 90 tablet 12  . montelukast (SINGULAIR) 10 MG tablet Take 1 tablet (10 mg total) by mouth at bedtime. 90 tablet 3  . mupirocin ointment (BACTROBAN) 2 % Apply to affected area TID for 7 days. 30 g 3  . nystatin (MYCOSTATIN/NYSTOP) powder Apply topically 4 (four) times daily. 60 g 3  . nystatin cream (MYCOSTATIN) Apply 1 application topically 2 (two) times daily. 30 g 12  . omeprazole  (PRILOSEC) 20 MG capsule TAKE 1 CAPSULE BY MOUTH  DAILY 90 capsule 1  . ondansetron (ZOFRAN) 4 MG tablet TAKE 1 TABLET BY MOUTH EVERY 8 HOURS AS NEEDED FOR NAUSEA AND VOMITING 20 tablet 0  . ondansetron (ZOFRAN-ODT) 4 MG disintegrating tablet DISSOLVE 1 TABLET ON THE  TONGUE EVERY 8 HOURS AS  NEEDED FOR NAUSEA AND  VOMITING 270 tablet 0  . Oxycodone HCl 10 MG TABS Take 10mg  po 2-3x per day as needed for pain. 90 tablet 0  . polyethylene glycol (MIRALAX / GLYCOLAX) packet Take 17 g by mouth daily. 14 each 0  . promethazine (PHENERGAN) 25 MG tablet Take 1 tablet (25 mg total) by mouth every 6 (six) hours as needed for nausea or vomiting. 30 tablet 2  . QUEtiapine (SEROQUEL) 25 MG tablet Take 1 tablet (25 mg total) by mouth at bedtime. For insomnia 90 tablet 1  . simvastatin (ZOCOR) 10 MG tablet Take 1 tablet (10 mg total) by mouth daily. 90 tablet 3  . triamterene-hydrochlorothiazide (MAXZIDE-25) 37.5-25 MG tablet TAKE 1 TABLET BY MOUTH EVERY DAY GENERIC EQUIVALENT FOR MAXZIDE-25 90 tablet 3  . TRINTELLIX 5 MG TABS tablet Take 1 tablet (5 mg total) by mouth daily. 90 tablet 3   No current facility-administered medications for this visit.    Allergies  Allergen Reactions  . Bee Venom Anaphylaxis    Has epi-pen  . Ceftin [Cefuroxime Axetil] Anaphylaxis  . Ciprofloxacin Anaphylaxis and Palpitations  . Ephedrine Other (See Comments) and Palpitations    "knocks me out"  . Sulfonamide Derivatives Anaphylaxis  . Telithromycin Anaphylaxis    Reaction: also blurred vision   . Valsartan Anaphylaxis  . Verapamil Anaphylaxis  . Venlafaxine Other (See Comments)    Insomnia, panic  . Beta Adrenergic Blockers     bradycardia  . Cephalosporins     Allergic Reaction  . Clindamycin/Lincomycin     Dry skin and itching/ Cdiff  . Cymbalta [Duloxetine Hcl] Other (See Comments)    Reaction:Confusion and " I fall down"  .  Doxazosin     "blurred vision and irritable"  . Hydralazine     HA, Diarrhea  .  Lamotrigine     Patient unsure at this time  . Morphine Other (See Comments)    Medication has no effect with pain  . Pregabalin Swelling  . Prochlorperazine Edisylate   . Pseudoephedrine Other (See Comments)    Reaction: hyperventilates and blacks out  . Relafen [Nabumetone] Swelling  . Zoloft [Sertraline] Other (See Comments)    "nervous/jittery"  . Doxycycline Hives  . Wellbutrin [Bupropion] Other (See Comments)    Jittery

## 2019-03-15 ENCOUNTER — Ambulatory Visit (INDEPENDENT_AMBULATORY_CARE_PROVIDER_SITE_OTHER): Payer: Medicare Other | Admitting: Psychology

## 2019-03-15 DIAGNOSIS — F339 Major depressive disorder, recurrent, unspecified: Secondary | ICD-10-CM | POA: Diagnosis not present

## 2019-03-15 DIAGNOSIS — F419 Anxiety disorder, unspecified: Secondary | ICD-10-CM | POA: Diagnosis not present

## 2019-03-20 ENCOUNTER — Other Ambulatory Visit: Payer: Self-pay

## 2019-03-20 MED ORDER — ALBUTEROL SULFATE HFA 108 (90 BASE) MCG/ACT IN AERS: 2.0000 | INHALATION_SPRAY | Freq: Four times a day (QID) | RESPIRATORY_TRACT | 0 refills | Status: AC | PRN

## 2019-03-22 ENCOUNTER — Telehealth: Payer: Self-pay

## 2019-03-22 NOTE — Telephone Encounter (Addendum)
1-[name] called and wanted to know if it is abnormal to have a blood pressure sitting at 124/78 and a standing blood pressure of 142/78.  2-She is having a lot of pain in her knees and back. She wanted to know if there was anything else she can take.   She does have more anxiety since [place] passed. She is going to therapy twice a month.

## 2019-03-25 ENCOUNTER — Ambulatory Visit (INDEPENDENT_AMBULATORY_CARE_PROVIDER_SITE_OTHER): Payer: Medicare Other | Admitting: Family Medicine

## 2019-03-25 VITALS — BP 124/78

## 2019-03-25 DIAGNOSIS — M1712 Unilateral primary osteoarthritis, left knee: Secondary | ICD-10-CM

## 2019-03-25 DIAGNOSIS — M069 Rheumatoid arthritis, unspecified: Secondary | ICD-10-CM | POA: Diagnosis not present

## 2019-03-25 DIAGNOSIS — I10 Essential (primary) hypertension: Secondary | ICD-10-CM

## 2019-03-25 DIAGNOSIS — F411 Generalized anxiety disorder: Secondary | ICD-10-CM

## 2019-03-25 DIAGNOSIS — M25562 Pain in left knee: Secondary | ICD-10-CM

## 2019-03-25 DIAGNOSIS — G8929 Other chronic pain: Secondary | ICD-10-CM

## 2019-03-25 NOTE — Telephone Encounter (Signed)
The blood pressure seems okay.  Sounds like we need to have a phone visit ordered an office visit sooner than October.  Please call and schedule appointment with me via phone or in person soon.

## 2019-03-25 NOTE — Telephone Encounter (Signed)
Patient advised and scheduled.  

## 2019-03-25 NOTE — Progress Notes (Signed)
Virtual Visit  I connected with      [name]  by a telemedicine application and verified that I am speaking with the correct person using two identifiers.   I discussed the limitations of evaluation and management by telemedicine and the availability of in person appointments. The patient expressed understanding and agreed to proceed.  History of Present Illness: [name] is a 85 y.o. female who would like to discuss increase stressors and increased pain.  Additionally patient had blood pressure checked with home health insurance company nurse.  Blood pressure was 120s over 70 sitting and 140s over 70 standing.  She wants to make sure that was fine.   BP sitting was fine and standing was 142/78.  She feels well otherwise no chest pain palpitations shortness of breath lightheadedness or dizziness.  Worsening pain in knees and back recently. Using voltaren gel 2-3x daily which does help some.  Continue oxycodone. Cannot reach back to place heating pads.  She has a history of right total knee replacement.  She notes her left knee is particularly bothersome.  Anxiety worsening recently.  Her husband died recently.  She is at home alone now.  Her daughters live [place] and cannot easily get up to [place] to help her.  She feels isolated at home. Still getting therapy about 2x per month.  She finds therapy to be somewhat helpful.  She also gets some help from her church.  She is thinking about moving closer to where her daughters live in [place] or [place] at some point in the future.  She like to avoid moving right now due to COVID-19.  Observations/Objective: BP 124/78  Wt Readings from Last 5 Encounters:  09/24/18 148 lb (67.1 kg)  08/17/18 145 lb (65.8 kg)  07/26/18 142 lb (64.4 kg)  06/26/18 141 lb (64 kg)  05/29/18 141 lb (64 kg)   Exam: Normal Speech.  Psych alert and oriented normal speech thought process and affect.  No SI or HI.  Lab and Radiology  Results EXAM: LEFT KNEE - COMPLETE 4+ VIEW  COMPARISON:  None.  FINDINGS: The bones are demineralized. Patient is status post right total knee arthroplasty. Moderate tricompartmental degenerative changes are present at the left knee, advanced within the medial compartment where there is bone-on-bone apposition. Meniscal chondrocalcinosis is present within the left knee. There is a small left knee joint effusion with air in the joint attributed to joint injection.  IMPRESSION: Advanced tricompartmental osteoarthritis at the left knee with intra-articular air attributed to joint injection earlier today. No acute osseous findings.   Electronically Signed   By: Richardean Sale M.D.   On: 01/01/2015 15:01 I personally (independently) visualized and performed the interpretation of the images attached in this note.   Assessment and Plan: 85 y.o. female with  Knee pain: Patient has severe DJD on left most recent x-ray from a few years ago.  She is having worsening pain.  She is failing typical conservative management.  Next step would likely be ultrasound injection.  Plan to schedule office visit in near future when daughter can take her.  We will get x-ray prior to visit and likely proceed with injection.  As for her back pain she has severe degenerative changes.  Not much to do here aside from potential referral to pain management for injections however we will discuss this further at follow-up visit.  Mood: Worsening anxiety.  She effectively is maxed out on her medications.  She is had multiple different trials of medications in the past.  There is not much more to try here.  Ultimately her anxiety is related to her living situation.  Change in living situation is probably her best bet.  Start thinking about moving closer to [place] or [place] or [place].  Blood pressure: Reasonably controlled.  No need to change.  PDMP not reviewed this encounter. No orders of the defined types  were placed in this encounter.  No orders of the defined types were placed in this encounter.   Follow Up Instructions:    I discussed the assessment and treatment plan with the patient. The patient was provided an opportunity to ask questions and all were answered. The patient agreed with the plan and demonstrated an understanding of the instructions.   The patient was advised to call back or seek an in-person evaluation if the symptoms worsen or if the condition fails to improve as anticipated.  Time: 15 minutes of intraservice time, with >22 minutes of total time during today's visit.      Historical information moved to improve visibility of documentation.  Past Medical History:  Diagnosis Date  . Anxiety   . Arthritis    osteoarthritis  . Asthma   . C. difficile diarrhea   . Candida esophagitis ([place])   . Chronic sinusitis   . Depression   . Depression   . Diverticulosis   . DJD (degenerative joint disease)     L5 compression fracture  . Edema extremities   . Fibromyalgia   . GERD (gastroesophageal reflux disease)   . Gout   . Hyperlipidemia   . Hypertension   . Osteoarthritis   . Osteopenia   . Panic disorder   . Renal failure, unspecified   . Spinal stenosis of lumbar region   . Tubular adenoma of colon   . Wrist fracture, left    Past Surgical History:  Procedure Laterality Date  . ADENOIDECTOMY    . CARPAL TUNNEL RELEASE  2007, 2009   Bilateral  . CATARACT EXTRACTION, BILATERAL    . CHOANAL ADENIODECTOMY    . DIAGNOSTIC LARYNGOSCOPY N/A 11/05/2015   Procedure: DIAGNOSTIC LARYNGOSCOPY AND ESOPHAGOSCOPY;  Surgeon: Rozetta Nunnery, MD;  Location: [facility];  Service: ENT;  Laterality: N/A;  . ganglion cyct removal on fingers     right  . ganglion cyst  wrist    . KNEE ARTHROSCOPY     right  . NASAL SINUS SURGERY    . OPEN REDUCTION INTERNAL FIXATION (ORIF) DISTAL RADIAL FRACTURE Left 12/18/2016   Procedure: OPEN REDUCTION INTERNAL FIXATION (ORIF)  DISTAL RADIAL FRACTURE;  Surgeon: Iran Planas, MD;  Location: [facility];  Service: Orthopedics;  Laterality: Left;  . SHOULDER SURGERY  2004, 07/30/10, 01/2011   after her right humeral neck fracture, revision hemiarthroplasty 2004 for persistent pain, revision reverse arthroplasty December 2011 for persistent pain, incision and drainage with poly-exchange 01/26/2011 for possible prosthetic joint infection.  . TONSILLECTOMY AND ADENOIDECTOMY    . TOTAL ABDOMINAL HYSTERECTOMY    . TOTAL HIP ARTHROPLASTY  2004   right  . TOTAL KNEE ARTHROPLASTY  2006   right  . TOTAL SHOULDER REPLACEMENT  2002   right   Social History   Tobacco Use  . Smoking status: Never Smoker  . Smokeless tobacco: Never Used  Substance Use Topics  . Alcohol use: No   family history includes Bladder Cancer in her father; Breast cancer in her unknown relative; Colon polyps in her  unknown relative; Depression in her son; Diabetes in her mother; Hypertension in her mother.  Medications: Current Outpatient Medications  Medication Sig Dispense Refill  . albuterol (VENTOLIN HFA) 108 (90 Base) MCG/ACT inhaler Inhale 2 puffs into the lungs every 6 (six) hours as needed for wheezing or shortness of breath. 6.7 g 0  . alendronate (FOSAMAX) 70 MG tablet Take 1 tablet (70 mg total) by mouth every 7 (seven) days. Take with a full glass of water on an empty stomach. 12 tablet 0  . ALPRAZolam (XANAX) 0.5 MG tablet Take 1 tablet (0.5 mg total) by mouth 4 (four) times daily. 360 tablet 0  . Calcium Carbonate-Vitamin D (CALCIUM 600+D) 600-400 MG-UNIT tablet Take 1 tablet by mouth 2 (two) times daily. 90 tablet 3  . cetirizine (ZYRTEC) 10 MG tablet Take 1 tablet (10 mg total) by mouth daily. 30 tablet 11  . diclofenac sodium (VOLTAREN) 1 % GEL Apply 4 g topically 4 (four) times daily. To affected joint. 500 g 99  . diphenhydrAMINE (BENADRYL) 25 MG tablet Take 25 mg by mouth every 6 (six) hours as needed. For allergies    .  diphenoxylate-atropine (LOMOTIL) 2.5-0.025 MG tablet One to 2 tablets by mouth 4 times a day as needed for diarrhea. 90 tablet 3  . EPINEPHrine 0.3 mg/0.3 mL IJ SOAJ injection Inject 0.3 mLs (0.3 mg total) into the muscle as needed for anaphylaxis. 1 Device 0  . fluticasone (FLONASE) 50 MCG/ACT nasal spray Place 2 sprays into both nostrils daily. 16 g 2  . furosemide (LASIX) 20 MG tablet Take 1 tablet (20 mg total) by mouth daily as needed for fluid. 90 tablet 1  . gabapentin (NEURONTIN) 100 MG capsule Take 1 capsule (100 mg total) by mouth 3 (three) times daily. 270 capsule 3  . ipratropium (ATROVENT) 0.06 % nasal spray USE 2 SPRAYS IN BOTH  NOSTRILS EVERY 4 HOURS AS  NEEDED 210 mL 1  . metroNIDAZOLE (METROGEL) 0.75 % gel Apply 1 application topically 2 (two) times daily. 45 g 3  . mirtazapine (REMERON) 30 MG tablet Take 1 tablet (30 mg total) by mouth at bedtime. 90 tablet 12  . montelukast (SINGULAIR) 10 MG tablet Take 1 tablet (10 mg total) by mouth at bedtime. 90 tablet 3  . mupirocin ointment (BACTROBAN) 2 % Apply to affected area TID for 7 days. 30 g 3  . nystatin (MYCOSTATIN/NYSTOP) powder Apply topically 4 (four) times daily. 60 g 3  . nystatin cream (MYCOSTATIN) Apply 1 application topically 2 (two) times daily. 30 g 12  . omeprazole (PRILOSEC) 20 MG capsule TAKE 1 CAPSULE BY MOUTH  DAILY 90 capsule 1  . ondansetron (ZOFRAN) 4 MG tablet TAKE 1 TABLET BY MOUTH EVERY 8 HOURS AS NEEDED FOR NAUSEA AND VOMITING 20 tablet 0  . ondansetron (ZOFRAN-ODT) 4 MG disintegrating tablet DISSOLVE 1 TABLET ON THE  TONGUE EVERY 8 HOURS AS  NEEDED FOR NAUSEA AND  VOMITING 270 tablet 0  . Oxycodone HCl 10 MG TABS Take 10mg  po 2-3x per day as needed for pain. 90 tablet 0  . polyethylene glycol (MIRALAX / GLYCOLAX) packet Take 17 g by mouth daily. 14 each 0  . promethazine (PHENERGAN) 25 MG tablet Take 1 tablet (25 mg total) by mouth every 6 (six) hours as needed for nausea or vomiting. 30 tablet 2  .  QUEtiapine (SEROQUEL) 25 MG tablet Take 1 tablet (25 mg total) by mouth at bedtime. For insomnia 90 tablet 1  . simvastatin (ZOCOR)  10 MG tablet Take 1 tablet (10 mg total) by mouth daily. 90 tablet 3  . triamterene-hydrochlorothiazide (MAXZIDE-25) 37.5-25 MG tablet TAKE 1 TABLET BY MOUTH EVERY DAY GENERIC EQUIVALENT FOR MAXZIDE-25 90 tablet 3  . TRINTELLIX 5 MG TABS tablet Take 1 tablet (5 mg total) by mouth daily. 90 tablet 3   No current facility-administered medications for this visit.    Allergies  Allergen Reactions  . Bee Venom Anaphylaxis    Has epi-pen  . Ceftin [Cefuroxime Axetil] Anaphylaxis  . Ciprofloxacin Anaphylaxis and Palpitations  . Ephedrine Other (See Comments) and Palpitations    "knocks me out"  . Sulfonamide Derivatives Anaphylaxis  . Telithromycin Anaphylaxis    Reaction: also blurred vision   . Valsartan Anaphylaxis  . Verapamil Anaphylaxis  . Venlafaxine Other (See Comments)    Insomnia, panic  . Beta Adrenergic Blockers     bradycardia  . Cephalosporins     Allergic Reaction  . Clindamycin/Lincomycin     Dry skin and itching/ Cdiff  . Cymbalta [Duloxetine Hcl] Other (See Comments)    Reaction:Confusion and " I fall down"  . Doxazosin     "blurred vision and irritable"  . Hydralazine     HA, Diarrhea  . Lamotrigine     Patient unsure at this time  . Morphine Other (See Comments)    Medication has no effect with pain  . Pregabalin Swelling  . Prochlorperazine Edisylate   . Pseudoephedrine Other (See Comments)    Reaction: hyperventilates and blacks out  . Relafen [Nabumetone] Swelling  . Zoloft [Sertraline] Other (See Comments)    "nervous/jittery"  . Doxycycline Hives  . Wellbutrin [Bupropion] Other (See Comments)    Jittery

## 2019-03-29 ENCOUNTER — Ambulatory Visit (INDEPENDENT_AMBULATORY_CARE_PROVIDER_SITE_OTHER): Payer: Medicare Other | Admitting: Psychology

## 2019-03-29 DIAGNOSIS — F419 Anxiety disorder, unspecified: Secondary | ICD-10-CM | POA: Diagnosis not present

## 2019-03-29 DIAGNOSIS — F339 Major depressive disorder, recurrent, unspecified: Secondary | ICD-10-CM | POA: Diagnosis not present

## 2019-04-02 ENCOUNTER — Ambulatory Visit (INDEPENDENT_AMBULATORY_CARE_PROVIDER_SITE_OTHER): Payer: Medicare Other | Admitting: Family Medicine

## 2019-04-02 ENCOUNTER — Other Ambulatory Visit: Payer: Self-pay

## 2019-04-02 ENCOUNTER — Ambulatory Visit (INDEPENDENT_AMBULATORY_CARE_PROVIDER_SITE_OTHER): Payer: Medicare Other

## 2019-04-02 ENCOUNTER — Telehealth: Payer: Self-pay | Admitting: Family Medicine

## 2019-04-02 VITALS — BP 127/84 | HR 78 | Temp 98.2°F | Wt 167.0 lb

## 2019-04-02 DIAGNOSIS — M1711 Unilateral primary osteoarthritis, right knee: Secondary | ICD-10-CM | POA: Diagnosis not present

## 2019-04-02 DIAGNOSIS — Z96651 Presence of right artificial knee joint: Secondary | ICD-10-CM

## 2019-04-02 DIAGNOSIS — Z96611 Presence of right artificial shoulder joint: Secondary | ICD-10-CM

## 2019-04-02 DIAGNOSIS — M25562 Pain in left knee: Secondary | ICD-10-CM | POA: Diagnosis not present

## 2019-04-02 DIAGNOSIS — L304 Erythema intertrigo: Secondary | ICD-10-CM

## 2019-04-02 DIAGNOSIS — Z23 Encounter for immunization: Secondary | ICD-10-CM

## 2019-04-02 DIAGNOSIS — M1712 Unilateral primary osteoarthritis, left knee: Secondary | ICD-10-CM | POA: Diagnosis not present

## 2019-04-02 DIAGNOSIS — F331 Major depressive disorder, recurrent, moderate: Secondary | ICD-10-CM

## 2019-04-02 DIAGNOSIS — M25512 Pain in left shoulder: Secondary | ICD-10-CM

## 2019-04-02 DIAGNOSIS — G8929 Other chronic pain: Secondary | ICD-10-CM

## 2019-04-02 MED ORDER — TRIAMCINOLONE ACETONIDE 0.5 % EX CREA: 1.0000 "application " | TOPICAL_CREAM | Freq: Two times a day (BID) | CUTANEOUS | 3 refills | Status: DC

## 2019-04-02 MED ORDER — TRIAMCINOLONE ACETONIDE 0.5 % EX CREA: 1.0000 "application " | TOPICAL_CREAM | Freq: Two times a day (BID) | CUTANEOUS | 3 refills | Status: AC

## 2019-04-02 NOTE — Telephone Encounter (Signed)
Pt aware RX was called into pharmacy.

## 2019-04-02 NOTE — Progress Notes (Signed)
[name] is a 85 y.o. female who presents to [facility]: [place] today for left knee pain, left shoulder pain, mental health, and rash.  Left knee.  Pain ongoing for years.  She is status post total right knee replacement.  She notes she has significant degenerative changes.  She has been using diclofenac gel and a Barrilleaux which has been helpful.  However her pain is been worsening steadily.  She denies any injury.  She is interested in an injection today if possible.  Left shoulder pain: Ongoing for months.  Patient has pain the lateral upper arm on her left side.  Pain is worse with arm motion.  She denies any significant pain with overhead motion reaching back however.  She has some grinding sensation in her shoulder.  She denies any acute injury.  She notes her pain started when she was having up her husband up a lot a few months ago.  Mental health: Caleena has a long history of difficult to control mental health problems.  She is been tried on multiple different medications and her current regimen listed below has been pretty stable.  She notes that she is been struggling after the death of her husband recently.  She is receiving counseling services and some pastoral care services which do help.  She takes Trintellix and she gets that through patient assistance program done yearly.  She notes it is coming up for read due for [employer]intell[employer] patient assistance paperwork.  Rash: Patient notes a rash on the posterior aspect of her neck.  She notes is mildly itchy.  She is not tried much treatment yet for it. ROS as above:  Exam:  BP 127/84    Pulse 78    Temp 98.2 F (36.8 C) (Oral)    Wt 167 lb (75.8 kg)    BMI 32.61 kg/m  Wt Readings from Last 5 Encounters:  04/02/19 167 lb (75.8 kg)  09/24/18 148 lb (67.1 kg)  08/17/18 145 lb (65.8 kg)  07/26/18 142 lb (64.4 kg)  06/26/18  141 lb (64 kg)    Gen: Well NAD HEENT: EOMI,  MMM Lungs: Normal work of breathing. CTABL Heart: RRR no MRG Abd: NABS, Soft. Nondistended, Nontender Exts: Brisk capillary refill, warm and well perfused.  MSK: Left shoulder normal-appearing crepitations with range of motion.  Abduction limited to 130 degrees active and passive without pain.  Normal external rotation and internal rotation. Intact strength.  Negative Hawkins and Neer's test. Left knee: Moderate effusion otherwise normal-appearing no erythema. Range of motion 5-100 degrees with crepitation. Tender palpation medial joint line. Strength is intact. Skin: Erythematous rash posterior neck as below.       Lab and Radiology Results X-ray images left knee obtained today personally independently reviewed. Severe end-stage bone-on-bone DJD medial compartment.  No acute fractures.  Moderate lateral and patellofemoral DJD present. Await formal radiology review  Procedure: Real-time Ultrasound Guided Injection of left shoulder glenohumeral joint Device: GE Logiq E   Images permanently stored and available for review in the ultrasound unit. Verbal informed consent obtained.  Discussed risks and benefits of procedure. Warned about infection bleeding damage to structures skin hypopigmentation and fat atrophy among others. Patient expresses understanding and agreement Time-out conducted.   Noted no overlying erythema, induration, or other signs of local infection.   Skin prepped in a sterile fashion.   Local anesthesia: Topical Ethyl chloride.   With sterile technique and under  real time ultrasound guidance:  40 mg of Depo-Medrol and 2 mL of Marcaine injected easily.   Completed without difficulty   Pain immediately resolved suggesting accurate placement of the medication.   Advised to call if fevers/chills, erythema, induration, drainage, or persistent bleeding.   Images permanently stored and available for review in the  ultrasound unit.  Impression: Technically successful ultrasound guided injection.     Procedure: Real-time Ultrasound Guided Injection of left knee Device: GE Logiq E   Images permanently stored and available for review in the ultrasound unit. Verbal informed consent obtained.  Discussed risks and benefits of procedure. Warned about infection bleeding damage to structures skin hypopigmentation and fat atrophy among others. Patient expresses understanding and agreement Time-out conducted.   Noted no overlying erythema, induration, or other signs of local infection.   Skin prepped in a sterile fashion.   Local anesthesia: Topical Ethyl chloride.   With sterile technique and under real time ultrasound guidance:  40 mg of Depo-Medrol and 3 mL of Marcaine injected easily.   Completed without difficulty   Pain immediately resolved suggesting accurate placement of the medication.   Advised to call if fevers/chills, erythema, induration, drainage, or persistent bleeding.   Images permanently stored and available for review in the ultrasound unit.  Impression: Technically successful ultrasound guided injection.        Assessment and Plan: 85 y.o. female with  Left knee pain: Severe degenerative changes on x-ray.  Formal radiology review pending.  Plan to treat with injection as above.  Continue diclofenac gel.  Home exercise program recheck as needed.  Left shoulder pain: Significant degenerative changes likely.  Injection as above.  Again diclofenac gel recheck as needed.  Mental health: Tr[employer]ell[employer] paperwork filled out on my end.  Patient will complete her side of it and send it in.  Continue current regimen otherwise.  Continue counseling.  Recheck phone visit 1 month.  Rash: Likely intertrigo.  Triamcinolone cream prescribed.  Recheck in 1 month if not improving.  I informed patient that I am transitioning to sports medicine only [facility] in [place] starting in  November.  Happy to see patient for continued sports medicine needs.  Discussed need for new PCP.  Provided some recommendations.  Flu vaccine given today prior to discharge.   PDMP not reviewed this encounter. Orders Placed This Encounter  Procedures   Flu Vaccine QUAD High Dose(Fluad)   Meds ordered this encounter  Medications   DISCONTD: triamcinolone cream (KENALOG) 0.5 %    Sig: Apply 1 application topically 2 (two) times daily. To affected areas.    Dispense:  30 g    Refill:  3     Historical information moved to improve visibility of documentation.  Past Medical History:  Diagnosis Date   Anxiety    Arthritis    osteoarthritis   Asthma    C. difficile diarrhea    Candida esophagitis (HCC)    Chronic sinusitis    Depression    Depression    Diverticulosis    DJD (degenerative joint disease)     L5 compression fracture   Edema extremities    Fibromyalgia    GERD (gastroesophageal reflux disease)    Gout    Hyperlipidemia    Hypertension    Osteoarthritis    Osteopenia    Panic disorder    Renal failure, unspecified    Spinal stenosis of lumbar region    Tubular adenoma of colon    Wrist fracture,  left    Past Surgical History:  Procedure Laterality Date   ADENOIDECTOMY     CARPAL TUNNEL RELEASE  2007, 2009   Bilateral   CATARACT EXTRACTION, BILATERAL     CHOANAL ADENIODECTOMY     DIAGNOSTIC LARYNGOSCOPY N/A 11/05/2015   Procedure: DIAGNOSTIC LARYNGOSCOPY AND ESOPHAGOSCOPY;  Surgeon: Rozetta Nunnery, MD;  Location: [facility];  Service: ENT;  Laterality: N/A;   ganglion cyct removal on fingers     right   ganglion cyst  wrist     KNEE ARTHROSCOPY     right   NASAL SINUS SURGERY     OPEN REDUCTION INTERNAL FIXATION (ORIF) DISTAL RADIAL FRACTURE Left 12/18/2016   Procedure: OPEN REDUCTION INTERNAL FIXATION (ORIF) DISTAL RADIAL FRACTURE;  Surgeon: Iran Planas, MD;  Location: [facility];  Service: Orthopedics;  Laterality:  Left;   SHOULDER SURGERY  2004, 07/30/10, 01/2011   after her right humeral neck fracture, revision hemiarthroplasty 2004 for persistent pain, revision reverse arthroplasty December 2011 for persistent pain, incision and drainage with poly-exchange 01/26/2011 for possible prosthetic joint infection.   TONSILLECTOMY AND ADENOIDECTOMY     TOTAL ABDOMINAL HYSTERECTOMY     TOTAL HIP ARTHROPLASTY  2004   right   TOTAL KNEE ARTHROPLASTY  2006   right   TOTAL SHOULDER REPLACEMENT  2002   right   Social History   Tobacco Use   Smoking status: Never Smoker   Smokeless tobacco: Never Used  Substance Use Topics   Alcohol use: No   family history includes Bladder Cancer in her father; Breast cancer in her unknown relative; Colon polyps in her unknown relative; Depression in her son; Diabetes in her mother; Hypertension in her mother.  Medications: Current Outpatient Medications  Medication Sig Dispense Refill   albuterol (VENTOLIN HFA) 108 (90 Base) MCG/ACT inhaler Inhale 2 puffs into the lungs every 6 (six) hours as needed for wheezing or shortness of breath. 6.7 g 0   alendronate (FOSAMAX) 70 MG tablet Take 1 tablet (70 mg total) by mouth every 7 (seven) days. Take with a full glass of water on an empty stomach. 12 tablet 0   ALPRAZolam (XANAX) 0.5 MG tablet Take 1 tablet (0.5 mg total) by mouth 4 (four) times daily. 360 tablet 0   Calcium Carbonate-Vitamin D (CALCIUM 600+D) 600-400 MG-UNIT tablet Take 1 tablet by mouth 2 (two) times daily. 90 tablet 3   cetirizine (ZYRTEC) 10 MG tablet Take 1 tablet (10 mg total) by mouth daily. 30 tablet 11   diclofenac sodium (VOLTAREN) 1 % GEL Apply 4 g topically 4 (four) times daily. To affected joint. 500 g 99   diphenhydrAMINE (BENADRYL) 25 MG tablet Take 25 mg by mouth every 6 (six) hours as needed. For allergies     diphenoxylate-atropine (LOMOTIL) 2.5-0.025 MG tablet One to 2 tablets by mouth 4 times a day as needed for diarrhea. 90  tablet 3   EPINEPHrine 0.3 mg/0.3 mL IJ SOAJ injection Inject 0.3 mLs (0.3 mg total) into the muscle as needed for anaphylaxis. 1 Device 0   fluticasone (FLONASE) 50 MCG/ACT nasal spray Place 2 sprays into both nostrils daily. 16 g 2   furosemide (LASIX) 20 MG tablet Take 1 tablet (20 mg total) by mouth daily as needed for fluid. 90 tablet 1   gabapentin (NEURONTIN) 100 MG capsule Take 1 capsule (100 mg total) by mouth 3 (three) times daily. 270 capsule 3   ipratropium (ATROVENT) 0.06 % nasal spray USE 2 SPRAYS IN BOTH  NOSTRILS EVERY 4 HOURS AS  NEEDED 210 mL 1   metroNIDAZOLE (METROGEL) 0.75 % gel Apply 1 application topically 2 (two) times daily. 45 g 3   mirtazapine (REMERON) 30 MG tablet Take 1 tablet (30 mg total) by mouth at bedtime. 90 tablet 12   montelukast (SINGULAIR) 10 MG tablet Take 1 tablet (10 mg total) by mouth at bedtime. 90 tablet 3   mupirocin ointment (BACTROBAN) 2 % Apply to affected area TID for 7 days. 30 g 3   nystatin (MYCOSTATIN/NYSTOP) powder Apply topically 4 (four) times daily. 60 g 3   nystatin cream (MYCOSTATIN) Apply 1 application topically 2 (two) times daily. 30 g 12   omeprazole (PRILOSEC) 20 MG capsule TAKE 1 CAPSULE BY MOUTH  DAILY 90 capsule 1   ondansetron (ZOFRAN) 4 MG tablet TAKE 1 TABLET BY MOUTH EVERY 8 HOURS AS NEEDED FOR NAUSEA AND VOMITING 20 tablet 0   ondansetron (ZOFRAN-ODT) 4 MG disintegrating tablet DISSOLVE 1 TABLET ON THE  TONGUE EVERY 8 HOURS AS  NEEDED FOR NAUSEA AND  VOMITING 270 tablet 0   Oxycodone HCl 10 MG TABS Take 10mg  po 2-3x per day as needed for pain. 90 tablet 0   polyethylene glycol (MIRALAX / GLYCOLAX) packet Take 17 g by mouth daily. 14 each 0   promethazine (PHENERGAN) 25 MG tablet Take 1 tablet (25 mg total) by mouth every 6 (six) hours as needed for nausea or vomiting. 30 tablet 2   QUEtiapine (SEROQUEL) 25 MG tablet Take 1 tablet (25 mg total) by mouth at bedtime. For insomnia 90 tablet 1   simvastatin  (ZOCOR) 10 MG tablet Take 1 tablet (10 mg total) by mouth daily. 90 tablet 3   triamcinolone cream (KENALOG) 0.5 % Apply 1 application topically 2 (two) times daily. To affected areas. 30 g 3   triamterene-hydrochlorothiazide (MAXZIDE-25) 37.5-25 MG tablet TAKE 1 TABLET BY MOUTH EVERY DAY GENERIC EQUIVALENT FOR MAXZIDE-25 90 tablet 3   TRINTELLIX 5 MG TABS tablet Take 1 tablet (5 mg total) by mouth daily. 90 tablet 3   No current facility-administered medications for this visit.    Allergies  Allergen Reactions   Bee Venom Anaphylaxis    Has epi-pen   Ceftin [Cefuroxime Axetil] Anaphylaxis   Ciprofloxacin Anaphylaxis and Palpitations   Ephedrine Other (See Comments) and Palpitations    "knocks me out"   Sulfonamide Derivatives Anaphylaxis   Telithromycin Anaphylaxis    Reaction: also blurred vision    Valsartan Anaphylaxis   Verapamil Anaphylaxis   Venlafaxine Other (See Comments)    Insomnia, panic   Beta Adrenergic Blockers     bradycardia   Cephalosporins     Allergic Reaction   Clindamycin/Lincomycin     Dry skin and itching/ Cdiff   Cymbalta [Duloxetine Hcl] Other (See Comments)    Reaction:Confusion and " I fall down"   Doxazosin     "blurred vision and irritable"   Hydralazine     HA, Diarrhea   Lamotrigine     Patient unsure at this time   Morphine Other (See Comments)    Medication has no effect with pain   Pregabalin Swelling   Prochlorperazine Edisylate    Pseudoephedrine Other (See Comments)    Reaction: hyperventilates and blacks out   Relafen [Nabumetone] Swelling   Zoloft [Sertraline] Other (See Comments)    "nervous/jittery"   Doxycycline Hives   Wellbutrin [Bupropion] Other (See Comments)    Jittery     Discussed warning signs or symptoms.  Please see discharge instructions. Patient expresses understanding.

## 2019-04-02 NOTE — Telephone Encounter (Signed)
Rash looks like intertrigo.  Use triamcinolone cream.  Rx sent to [facility] as well as [facility].

## 2019-04-02 NOTE — Patient Instructions (Signed)
Thank you for coming in today.  Call or go to the ER if you develop a large red swollen joint with extreme pain or oozing puss.   Let me know how you are doing.   Recheck in 1 month phone visit.   I will be moving to full time Sports Medicine in [place] starting on November 1st.  You will still be able to see me for your Sports Medicine or Orthopedic needs at [facility] in [place]. I will still be part of [facility] [facility].    If you want to stay locally for your Sports Medicine issues Dr. Dianah Field here in [place] will be happy to see you.  Additionally Dr. Clearance Coots at [facility] will be happy to see you for sports medicine issues more locally.   For your primary care needs you are welcome to establish care with Dr. Emeterio Reeve.  We are working quickly to hire more physicians to cover the primary care needs however if you cannot get an appointment with Dr. Sheppard Coil in a timely manner [facility] has locations and openings for primary care services nearby.   [facility] at [facility] [address] . [place] , [place]: [phone] . Behavioral Medicine: [phone] . Fax: [place] at [employer] [address] . [place], [place]: [phone] . Behavioral Medicine: [phone] . Fax: [phone] . Hours (M-F): 7am - Academic librarian At [facility]. [place], [place]: [phone] . Behavioral Medicine: [phone] . Fax: [phone] . Hours (M-F): 8am - Optician, dispensing at [employer] . [place], [place] Phone: [phone] . Behavioral Medicine: [phone] . Fax: [phone]

## 2019-04-03 ENCOUNTER — Telehealth: Payer: Self-pay | Admitting: Family Medicine

## 2019-04-03 NOTE — Telephone Encounter (Signed)
FYI: Pt said she had her Medicare Wellness Exam last week, NP with [employer] came to her house.

## 2019-04-10 NOTE — Telephone Encounter (Signed)
Pt does not want a Well Visit at this time.

## 2019-04-12 ENCOUNTER — Telehealth: Payer: Self-pay

## 2019-04-12 ENCOUNTER — Ambulatory Visit (INDEPENDENT_AMBULATORY_CARE_PROVIDER_SITE_OTHER): Payer: Medicare Other | Admitting: Psychology

## 2019-04-12 ENCOUNTER — Other Ambulatory Visit: Payer: Self-pay

## 2019-04-12 DIAGNOSIS — F339 Major depressive disorder, recurrent, unspecified: Secondary | ICD-10-CM | POA: Diagnosis not present

## 2019-04-12 DIAGNOSIS — F419 Anxiety disorder, unspecified: Secondary | ICD-10-CM | POA: Diagnosis not present

## 2019-04-12 MED ORDER — OXYCODONE HCL 10 MG PO TABS: ORAL_TABLET | ORAL | 0 refills | Status: DC

## 2019-04-12 MED ORDER — EPINEPHRINE 0.3 MG/0.3ML IJ SOAJ: 0.3000 mg | INTRAMUSCULAR | 1 refills | Status: DC | PRN

## 2019-04-12 NOTE — Telephone Encounter (Signed)
Caitlynn would like to switch to Dr Dianah Field when Dr Georgina Snell leaves.

## 2019-04-12 NOTE — Telephone Encounter (Signed)
I am sorry but unfortunately I am not taking new primary care patients, Dr. Zigmund Daniel will be available.

## 2019-04-12 NOTE — Telephone Encounter (Signed)
Called patient, phone rang and then disconnected.

## 2019-04-25 ENCOUNTER — Ambulatory Visit (INDEPENDENT_AMBULATORY_CARE_PROVIDER_SITE_OTHER): Payer: Medicare Other | Admitting: Family Medicine

## 2019-04-25 DIAGNOSIS — F411 Generalized anxiety disorder: Secondary | ICD-10-CM | POA: Diagnosis not present

## 2019-04-25 DIAGNOSIS — F410 Panic disorder [episodic paroxysmal anxiety] without agoraphobia: Secondary | ICD-10-CM

## 2019-04-25 NOTE — Progress Notes (Signed)
Virtual Visit  I connected with      [name]  by a telemedicine application and verified that I am speaking with the correct person using two identifiers.   I discussed the limitations of evaluation and management by telemedicine and the availability of in person appointments. The patient expressed understanding and agreed to proceed.  History of Present Illness: [name] is a 85 y.o. female who would like to discuss increased anxiety.  [name] has a long history of anxiety and depression.  She has a long history of trying multiple different medications many of which she had intolerances or allergies to.  She has been reasonably managed with Trintellix,  Xanax, and occasional nighttime quetiapine.  However she is always had relatively bothersome anxiety despite the best that we could achieve regimen as above.  She is had recent stressors.  Her husband died recently.  Her sister-in-law died just around the same time her husband and she found out yesterday that another relative died.  Her birthday is coming up in 3 days and she will be the same age her husband was and he died.  She notes that she awoke this morning with lots of anxiety and thoughts that she may die next.  She called her neighbor over and they had a talk and she tried calling her daughter.  She notes that this has not helped much.  She still very anxious.  She feels as though she is "falling apart".   She also notes that her son recently would not let her borrow his truck to drive to church and she is upset about that as well.  She tried taking her 0.5 mg Xanax this morning which did not help.  She does not know what to do next.    Observations/Objective: There were no vitals taken for this visit. Wt Readings from Last 5 Encounters:  04/02/19 167 lb (75.8 kg)  09/24/18 148 lb (67.1 kg)  08/17/18 145 lb (65.8 kg)  07/26/18 142 lb (64.4 kg)  06/26/18 141 lb (64 kg)   Exam: Normal Speech.  Psych: Alert  and oriented.  Patient crying and tearful.  However speech and thought process are normal and linear and goal-directed.  No SI or HI.  Lab and Radiology Results No results found for this or any previous visit (from the past 72 hour(s)). No results found.   Assessment and Plan: 85 y.o. female with worsening anxiety.  Patient has lots of increased stressors.  She has a lot of recent death in her family and her birthday is coming up.  Is obvious that she is worried about that.  I think the basic explanation for day is exacerbation of underlying anxiety or possibly panic attack.  Plan to temporarily for 1 day increase as a next dose.  She can take up to 1 mg as needed as opposed to 0.5 mg.  Also backup plan to use her 25 mg Seroquel during the day if needed if not controlled.  Noted that this will make her sleepy.  She can take it again at night if she needs to.  She will keep me updated and we can talk again tomorrow if we need to.   PDMP not reviewed this encounter. No orders of the defined types were placed in this encounter.  No orders of the defined types were placed in this encounter.   Follow Up Instructions:    I discussed the assessment and treatment plan with the patient.  The patient was provided an opportunity to ask questions and all were answered. The patient agreed with the plan and demonstrated an understanding of the instructions.   The patient was advised to call back or seek an in-person evaluation if the symptoms worsen or if the condition fails to improve as anticipated.  Time: 15 minutes of intraservice time, with >22 minutes of total time during today's visit.      Historical information moved to improve visibility of documentation.  Past Medical History:  Diagnosis Date  . Anxiety   . Arthritis    osteoarthritis  . Asthma   . C. difficile diarrhea   . Candida esophagitis ([place])   . Chronic sinusitis   . Depression   . Depression   . Diverticulosis   . DJD  (degenerative joint disease)     L5 compression fracture  . Edema extremities   . Fibromyalgia   . GERD (gastroesophageal reflux disease)   . Gout   . Hyperlipidemia   . Hypertension   . Osteoarthritis   . Osteopenia   . Panic disorder   . Renal failure, unspecified   . Spinal stenosis of lumbar region   . Tubular adenoma of colon   . Wrist fracture, left    Past Surgical History:  Procedure Laterality Date  . ADENOIDECTOMY    . CARPAL TUNNEL RELEASE  2007, 2009   Bilateral  . CATARACT EXTRACTION, BILATERAL    . CHOANAL ADENIODECTOMY    . DIAGNOSTIC LARYNGOSCOPY N/A 11/05/2015   Procedure: DIAGNOSTIC LARYNGOSCOPY AND ESOPHAGOSCOPY;  Surgeon: Rozetta Nunnery, MD;  Location: [facility];  Service: ENT;  Laterality: N/A;  . ganglion cyct removal on fingers     right  . ganglion cyst  wrist    . KNEE ARTHROSCOPY     right  . NASAL SINUS SURGERY    . OPEN REDUCTION INTERNAL FIXATION (ORIF) DISTAL RADIAL FRACTURE Left 12/18/2016   Procedure: OPEN REDUCTION INTERNAL FIXATION (ORIF) DISTAL RADIAL FRACTURE;  Surgeon: Iran Planas, MD;  Location: [facility];  Service: Orthopedics;  Laterality: Left;  . SHOULDER SURGERY  2004, 07/30/10, 01/2011   after her right humeral neck fracture, revision hemiarthroplasty 2004 for persistent pain, revision reverse arthroplasty December 2011 for persistent pain, incision and drainage with poly-exchange 01/26/2011 for possible prosthetic joint infection.  . TONSILLECTOMY AND ADENOIDECTOMY    . TOTAL ABDOMINAL HYSTERECTOMY    . TOTAL HIP ARTHROPLASTY  2004   right  . TOTAL KNEE ARTHROPLASTY  2006   right  . TOTAL SHOULDER REPLACEMENT  2002   right   Social History   Tobacco Use  . Smoking status: Never Smoker  . Smokeless tobacco: Never Used  Substance Use Topics  . Alcohol use: No   family history includes Bladder Cancer in her father; Breast cancer in her unknown relative; Colon polyps in her unknown relative; Depression in her son; Diabetes in  her mother; Hypertension in her mother.  Medications: Current Outpatient Medications  Medication Sig Dispense Refill  . albuterol (VENTOLIN HFA) 108 (90 Base) MCG/ACT inhaler Inhale 2 puffs into the lungs every 6 (six) hours as needed for wheezing or shortness of breath. 6.7 g 0  . alendronate (FOSAMAX) 70 MG tablet Take 1 tablet (70 mg total) by mouth every 7 (seven) days. Take with a full glass of water on an empty stomach. 12 tablet 0  . ALPRAZolam (XANAX) 0.5 MG tablet Take 1 tablet (0.5 mg total) by mouth 4 (four) times daily. 360 tablet  0  . Calcium Carbonate-Vitamin D (CALCIUM 600+D) 600-400 MG-UNIT tablet Take 1 tablet by mouth 2 (two) times daily. 90 tablet 3  . cetirizine (ZYRTEC) 10 MG tablet Take 1 tablet (10 mg total) by mouth daily. 30 tablet 11  . diclofenac sodium (VOLTAREN) 1 % GEL Apply 4 g topically 4 (four) times daily. To affected joint. 500 g 99  . diphenhydrAMINE (BENADRYL) 25 MG tablet Take 25 mg by mouth every 6 (six) hours as needed. For allergies    . diphenoxylate-atropine (LOMOTIL) 2.5-0.025 MG tablet One to 2 tablets by mouth 4 times a day as needed for diarrhea. 90 tablet 3  . EPINEPHrine 0.3 mg/0.3 mL IJ SOAJ injection Inject 0.3 mLs (0.3 mg total) into the muscle as needed for anaphylaxis. 1 each 1  . fluticasone (FLONASE) 50 MCG/ACT nasal spray Place 2 sprays into both nostrils daily. 16 g 2  . furosemide (LASIX) 20 MG tablet Take 1 tablet (20 mg total) by mouth daily as needed for fluid. 90 tablet 1  . gabapentin (NEURONTIN) 100 MG capsule Take 1 capsule (100 mg total) by mouth 3 (three) times daily. 270 capsule 3  . ipratropium (ATROVENT) 0.06 % nasal spray USE 2 SPRAYS IN BOTH  NOSTRILS EVERY 4 HOURS AS  NEEDED 210 mL 1  . metroNIDAZOLE (METROGEL) 0.75 % gel Apply 1 application topically 2 (two) times daily. 45 g 3  . mirtazapine (REMERON) 30 MG tablet Take 1 tablet (30 mg total) by mouth at bedtime. 90 tablet 12  . montelukast (SINGULAIR) 10 MG tablet Take 1  tablet (10 mg total) by mouth at bedtime. 90 tablet 3  . mupirocin ointment (BACTROBAN) 2 % Apply to affected area TID for 7 days. 30 g 3  . nystatin (MYCOSTATIN/NYSTOP) powder Apply topically 4 (four) times daily. 60 g 3  . nystatin cream (MYCOSTATIN) Apply 1 application topically 2 (two) times daily. 30 g 12  . omeprazole (PRILOSEC) 20 MG capsule TAKE 1 CAPSULE BY MOUTH  DAILY 90 capsule 1  . ondansetron (ZOFRAN) 4 MG tablet TAKE 1 TABLET BY MOUTH EVERY 8 HOURS AS NEEDED FOR NAUSEA AND VOMITING 20 tablet 0  . ondansetron (ZOFRAN-ODT) 4 MG disintegrating tablet DISSOLVE 1 TABLET ON THE  TONGUE EVERY 8 HOURS AS  NEEDED FOR NAUSEA AND  VOMITING 270 tablet 0  . Oxycodone HCl 10 MG TABS Take 10mg  po 2-3x per day as needed for pain. 90 tablet 0  . polyethylene glycol (MIRALAX / GLYCOLAX) packet Take 17 g by mouth daily. 14 each 0  . promethazine (PHENERGAN) 25 MG tablet Take 1 tablet (25 mg total) by mouth every 6 (six) hours as needed for nausea or vomiting. 30 tablet 2  . QUEtiapine (SEROQUEL) 25 MG tablet Take 1 tablet (25 mg total) by mouth at bedtime. For insomnia 90 tablet 1  . simvastatin (ZOCOR) 10 MG tablet Take 1 tablet (10 mg total) by mouth daily. 90 tablet 3  . triamcinolone cream (KENALOG) 0.5 % Apply 1 application topically 2 (two) times daily. To affected areas. 30 g 3  . triamterene-hydrochlorothiazide (MAXZIDE-25) 37.5-25 MG tablet TAKE 1 TABLET BY MOUTH EVERY DAY GENERIC EQUIVALENT FOR MAXZIDE-25 90 tablet 3  . TRINTELLIX 5 MG TABS tablet Take 1 tablet (5 mg total) by mouth daily. 90 tablet 3   No current facility-administered medications for this visit.    Allergies  Allergen Reactions  . Bee Venom Anaphylaxis    Has epi-pen  . Ceftin [Cefuroxime Axetil] Anaphylaxis  .  Ciprofloxacin Anaphylaxis and Palpitations  . Ephedrine Other (See Comments) and Palpitations    "knocks me out"  . Sulfonamide Derivatives Anaphylaxis  . Telithromycin Anaphylaxis    Reaction: also blurred  vision   . Valsartan Anaphylaxis  . Verapamil Anaphylaxis  . Venlafaxine Other (See Comments)    Insomnia, panic  . Beta Adrenergic Blockers     bradycardia  . Cephalosporins     Allergic Reaction  . Clindamycin/Lincomycin     Dry skin and itching/ Cdiff  . Cymbalta [Duloxetine Hcl] Other (See Comments)    Reaction:Confusion and " I fall down"  . Doxazosin     "blurred vision and irritable"  . Hydralazine     HA, Diarrhea  . Lamotrigine     Patient unsure at this time  . Morphine Other (See Comments)    Medication has no effect with pain  . Pregabalin Swelling  . Prochlorperazine Edisylate   . Pseudoephedrine Other (See Comments)    Reaction: hyperventilates and blacks out  . Relafen [Nabumetone] Swelling  . Zoloft [Sertraline] Other (See Comments)    "nervous/jittery"  . Doxycycline Hives  . Wellbutrin [Bupropion] Other (See Comments)    Jittery

## 2019-04-26 ENCOUNTER — Ambulatory Visit (INDEPENDENT_AMBULATORY_CARE_PROVIDER_SITE_OTHER): Payer: Medicare Other | Admitting: Psychology

## 2019-04-26 DIAGNOSIS — F339 Major depressive disorder, recurrent, unspecified: Secondary | ICD-10-CM | POA: Diagnosis not present

## 2019-04-26 DIAGNOSIS — F419 Anxiety disorder, unspecified: Secondary | ICD-10-CM

## 2019-05-07 ENCOUNTER — Ambulatory Visit (INDEPENDENT_AMBULATORY_CARE_PROVIDER_SITE_OTHER): Payer: Medicare Other | Admitting: Family Medicine

## 2019-05-07 DIAGNOSIS — F411 Generalized anxiety disorder: Secondary | ICD-10-CM

## 2019-05-07 DIAGNOSIS — G894 Chronic pain syndrome: Secondary | ICD-10-CM

## 2019-05-07 NOTE — Progress Notes (Signed)
Virtual Visit  I connected with      [name]  by a telemedicine application and verified that I am speaking with the correct person using two identifiers.   I discussed the limitations of evaluation and management by telemedicine and the availability of in person appointments. The patient expressed understanding and agreed to proceed.  History of Present Illness: [name] is a 85 y.o. female who would like to discuss follow-up anxiety and pain.  [name] has a long history of anxiety that has been difficult to manage. She has been tried on multiple different medications in the past with little benefit for intolerable side effects.  She has been on her current regemin for a few years now of Xanax and Trintellix. She had seroquel 25mg  QHS prn added a few months ago. She notes that her anxiety has worsened following her husband's death a few months ago. She has been doing therapy with [name] with little benefit. She feels anxious and out of sorts during the day. She does not take the seroquel most evening but when she does take it she finds that it does help.    She does have chronic pain attributed to her severe degenerative changes in her cervical thoracic and lumbar spine.  Additionally she continues to experience chronic wrist pain following fracture several years ago.  This is all typically pretty well managed with her oxycodone dose.  Observations/Objective: There were no vitals taken for this visit. Wt Readings from Last 5 Encounters:  04/02/19 167 lb (75.8 kg)  09/24/18 148 lb (67.1 kg)  08/17/18 145 lb (65.8 kg)  07/26/18 142 lb (64.4 kg)  06/26/18 141 lb (64 kg)   Exam: Normal Speech.  Psych alert and oriented.  Affect occasionally tearful.  No SI or HI expressed.  Lab and Radiology Results No results found for this or any previous visit (from the past 72 hour(s)). No results found.   Assessment and Plan: 85 y.o. female with  Mood: Not well controlled  depression or anxiety.  Slightly worse than her baseline.  She certainly has lots of stressors.  She has been on multiple different medications in the past with either intolerances or little benefit.  I think her basic regimen now is about is what good is is going to get.  I think it is reasonable to continue counseling and to continue support as much she can get it from her family and her community.  However in terms of medications we will ask her to start taking the quetiapine/Seroquel most evenings.  She can then shift her Xanax dose that she takes in the evening a bit forward into the day thereby controlling her anxiety a bit better.  Plan to recheck in about a month.  Unfortunate that time I will be transitioned out of this clinic working only in sports medicine.  Advised her to schedule with my partner here Dr. Dianah Field whom she already knows.  She will probably need visits every month or every 2 months with Dr. Darene Lamer until her new PMD care provider likely Dr. Rodman Key starts sometime in January or February.  She understands that Dr. Dianah Field is unlikely to be her ongoing primary care provider.  We will continue to refill her oxycodone as needed for chronic pain management as it is working.  PDMP not reviewed this encounter. No orders of the defined types were placed in this encounter.  No orders of the defined types were placed in this encounter.  Follow Up Instructions:    I discussed the assessment and treatment plan with the patient. The patient was provided an opportunity to ask questions and all were answered. The patient agreed with the plan and demonstrated an understanding of the instructions.   The patient was advised to call back or seek an in-person evaluation if the symptoms worsen or if the condition fails to improve as anticipated.  Time: 15 minutes of intraservice time, with >22 minutes of total time during today's visit.      Historical information moved to improve  visibility of documentation.  Past Medical History:  Diagnosis Date  . Anxiety   . Arthritis    osteoarthritis  . Asthma   . C. difficile diarrhea   . Candida esophagitis ([place])   . Chronic sinusitis   . Depression   . Depression   . Diverticulosis   . DJD (degenerative joint disease)     L5 compression fracture  . Edema extremities   . Fibromyalgia   . GERD (gastroesophageal reflux disease)   . Gout   . Hyperlipidemia   . Hypertension   . Osteoarthritis   . Osteopenia   . Panic disorder   . Renal failure, unspecified   . Spinal stenosis of lumbar region   . Tubular adenoma of colon   . Wrist fracture, left    Past Surgical History:  Procedure Laterality Date  . ADENOIDECTOMY    . CARPAL TUNNEL RELEASE  2007, 2009   Bilateral  . CATARACT EXTRACTION, BILATERAL    . CHOANAL ADENIODECTOMY    . DIAGNOSTIC LARYNGOSCOPY N/A 11/05/2015   Procedure: DIAGNOSTIC LARYNGOSCOPY AND ESOPHAGOSCOPY;  Surgeon: Rozetta Nunnery, MD;  Location: [facility];  Service: ENT;  Laterality: N/A;  . ganglion cyct removal on fingers     right  . ganglion cyst  wrist    . KNEE ARTHROSCOPY     right  . NASAL SINUS SURGERY    . OPEN REDUCTION INTERNAL FIXATION (ORIF) DISTAL RADIAL FRACTURE Left 12/18/2016   Procedure: OPEN REDUCTION INTERNAL FIXATION (ORIF) DISTAL RADIAL FRACTURE;  Surgeon: Iran Planas, MD;  Location: [facility];  Service: Orthopedics;  Laterality: Left;  . SHOULDER SURGERY  2004, 07/30/10, 01/2011   after her right humeral neck fracture, revision hemiarthroplasty 2004 for persistent pain, revision reverse arthroplasty December 2011 for persistent pain, incision and drainage with poly-exchange 01/26/2011 for possible prosthetic joint infection.  . TONSILLECTOMY AND ADENOIDECTOMY    . TOTAL ABDOMINAL HYSTERECTOMY    . TOTAL HIP ARTHROPLASTY  2004   right  . TOTAL KNEE ARTHROPLASTY  2006   right  . TOTAL SHOULDER REPLACEMENT  2002   right   Social History   Tobacco Use  . Smoking  status: Never Smoker  . Smokeless tobacco: Never Used  Substance Use Topics  . Alcohol use: No   family history includes Bladder Cancer in her father; Breast cancer in her unknown relative; Colon polyps in her unknown relative; Depression in her son; Diabetes in her mother; Hypertension in her mother.  Medications: Current Outpatient Medications  Medication Sig Dispense Refill  . albuterol (VENTOLIN HFA) 108 (90 Base) MCG/ACT inhaler Inhale 2 puffs into the lungs every 6 (six) hours as needed for wheezing or shortness of breath. 6.7 g 0  . alendronate (FOSAMAX) 70 MG tablet Take 1 tablet (70 mg total) by mouth every 7 (seven) days. Take with a full glass of water on an empty stomach. 12 tablet 0  . ALPRAZolam (XANAX) 0.5  MG tablet Take 1 tablet (0.5 mg total) by mouth 4 (four) times daily. 360 tablet 0  . Calcium Carbonate-Vitamin D (CALCIUM 600+D) 600-400 MG-UNIT tablet Take 1 tablet by mouth 2 (two) times daily. 90 tablet 3  . cetirizine (ZYRTEC) 10 MG tablet Take 1 tablet (10 mg total) by mouth daily. 30 tablet 11  . diclofenac sodium (VOLTAREN) 1 % GEL Apply 4 g topically 4 (four) times daily. To affected joint. 500 g 99  . diphenhydrAMINE (BENADRYL) 25 MG tablet Take 25 mg by mouth every 6 (six) hours as needed. For allergies    . diphenoxylate-atropine (LOMOTIL) 2.5-0.025 MG tablet One to 2 tablets by mouth 4 times a day as needed for diarrhea. 90 tablet 3  . EPINEPHrine 0.3 mg/0.3 mL IJ SOAJ injection Inject 0.3 mLs (0.3 mg total) into the muscle as needed for anaphylaxis. 1 each 1  . fluticasone (FLONASE) 50 MCG/ACT nasal spray Place 2 sprays into both nostrils daily. 16 g 2  . furosemide (LASIX) 20 MG tablet Take 1 tablet (20 mg total) by mouth daily as needed for fluid. 90 tablet 1  . gabapentin (NEURONTIN) 100 MG capsule Take 1 capsule (100 mg total) by mouth 3 (three) times daily. 270 capsule 3  . ipratropium (ATROVENT) 0.06 % nasal spray USE 2 SPRAYS IN BOTH  NOSTRILS EVERY 4 HOURS  AS  NEEDED 210 mL 1  . metroNIDAZOLE (METROGEL) 0.75 % gel Apply 1 application topically 2 (two) times daily. 45 g 3  . mirtazapine (REMERON) 30 MG tablet Take 1 tablet (30 mg total) by mouth at bedtime. 90 tablet 12  . montelukast (SINGULAIR) 10 MG tablet Take 1 tablet (10 mg total) by mouth at bedtime. 90 tablet 3  . mupirocin ointment (BACTROBAN) 2 % Apply to affected area TID for 7 days. 30 g 3  . nystatin (MYCOSTATIN/NYSTOP) powder Apply topically 4 (four) times daily. 60 g 3  . nystatin cream (MYCOSTATIN) Apply 1 application topically 2 (two) times daily. 30 g 12  . omeprazole (PRILOSEC) 20 MG capsule TAKE 1 CAPSULE BY MOUTH  DAILY 90 capsule 1  . ondansetron (ZOFRAN) 4 MG tablet TAKE 1 TABLET BY MOUTH EVERY 8 HOURS AS NEEDED FOR NAUSEA AND VOMITING 20 tablet 0  . ondansetron (ZOFRAN-ODT) 4 MG disintegrating tablet DISSOLVE 1 TABLET ON THE  TONGUE EVERY 8 HOURS AS  NEEDED FOR NAUSEA AND  VOMITING 270 tablet 0  . Oxycodone HCl 10 MG TABS Take 10mg  po 2-3x per day as needed for pain. 90 tablet 0  . polyethylene glycol (MIRALAX / GLYCOLAX) packet Take 17 g by mouth daily. 14 each 0  . promethazine (PHENERGAN) 25 MG tablet Take 1 tablet (25 mg total) by mouth every 6 (six) hours as needed for nausea or vomiting. 30 tablet 2  . QUEtiapine (SEROQUEL) 25 MG tablet Take 1 tablet (25 mg total) by mouth at bedtime. For insomnia 90 tablet 1  . simvastatin (ZOCOR) 10 MG tablet Take 1 tablet (10 mg total) by mouth daily. 90 tablet 3  . triamcinolone cream (KENALOG) 0.5 % Apply 1 application topically 2 (two) times daily. To affected areas. 30 g 3  . triamterene-hydrochlorothiazide (MAXZIDE-25) 37.5-25 MG tablet TAKE 1 TABLET BY MOUTH EVERY DAY GENERIC EQUIVALENT FOR MAXZIDE-25 90 tablet 3  . TRINTELLIX 5 MG TABS tablet Take 1 tablet (5 mg total) by mouth daily. 90 tablet 3   No current facility-administered medications for this visit.    Allergies  Allergen Reactions  .  Bee Venom Anaphylaxis    Has  epi-pen  . Ceftin [Cefuroxime Axetil] Anaphylaxis  . Ciprofloxacin Anaphylaxis and Palpitations  . Ephedrine Other (See Comments) and Palpitations    "knocks me out"  . Sulfonamide Derivatives Anaphylaxis  . Telithromycin Anaphylaxis    Reaction: also blurred vision   . Valsartan Anaphylaxis  . Verapamil Anaphylaxis  . Venlafaxine Other (See Comments)    Insomnia, panic  . Beta Adrenergic Blockers     bradycardia  . Cephalosporins     Allergic Reaction  . Clindamycin/Lincomycin     Dry skin and itching/ Cdiff  . Cymbalta [Duloxetine Hcl] Other (See Comments)    Reaction:Confusion and " I fall down"  . Doxazosin     "blurred vision and irritable"  . Hydralazine     HA, Diarrhea  . Lamotrigine     Patient unsure at this time  . Morphine Other (See Comments)    Medication has no effect with pain  . Pregabalin Swelling  . Prochlorperazine Edisylate   . Pseudoephedrine Other (See Comments)    Reaction: hyperventilates and blacks out  . Relafen [Nabumetone] Swelling  . Zoloft [Sertraline] Other (See Comments)    "nervous/jittery"  . Doxycycline Hives  . Wellbutrin [Bupropion] Other (See Comments)    Jittery

## 2019-05-10 ENCOUNTER — Ambulatory Visit: Payer: Medicare Other | Admitting: Psychology

## 2019-05-13 ENCOUNTER — Ambulatory Visit (INDEPENDENT_AMBULATORY_CARE_PROVIDER_SITE_OTHER): Payer: Medicare Other | Admitting: Psychology

## 2019-05-13 DIAGNOSIS — F339 Major depressive disorder, recurrent, unspecified: Secondary | ICD-10-CM

## 2019-05-13 DIAGNOSIS — F419 Anxiety disorder, unspecified: Secondary | ICD-10-CM

## 2019-05-17 ENCOUNTER — Ambulatory Visit (INDEPENDENT_AMBULATORY_CARE_PROVIDER_SITE_OTHER): Payer: Medicare Other | Admitting: Family Medicine

## 2019-05-17 ENCOUNTER — Other Ambulatory Visit: Payer: Self-pay

## 2019-05-17 DIAGNOSIS — F411 Generalized anxiety disorder: Secondary | ICD-10-CM | POA: Diagnosis not present

## 2019-05-17 DIAGNOSIS — R110 Nausea: Secondary | ICD-10-CM | POA: Diagnosis not present

## 2019-05-17 NOTE — Progress Notes (Signed)
Virtual Visit  I connected with      [name]  by a telemedicine application and verified that I am speaking with the correct person using two identifiers.   I discussed the limitations of evaluation and management by telemedicine and the availability of in person appointments. The patient expressed understanding and agreed to proceed.  History of Present Illness: [name] is a 85 y.o. female who would like to discuss anxiety and nausea.  [name] has a long history of anxiety and depression.  As noted previously this is been worsening somewhat recently.  Her husband died a few months ago and she is struggling.  She is receiving counseling every 2 weeks but is interested in trying to get it scheduled for weekly if possible.  She uses Xanax intermittently which does help some.  Additionally she takes Trintellix and Remeron.  She has been on many medications in the past with little benefit or intolerances.  She notes however her nausea has been worsening recently.  She feels as though it is quite bad.  She has been taking Zofran pretty regularly which previously helped but is no longer helping much.  She is tried some over-the-counter Dramamine which helps some.  She does have a prescription for Phenergan but notes that makes her very fatigued.  She wonders if her medications may be causing her nausea.  She notes that she is on multiple medications that nausea is listed as a side effect.  Additionally she is wondering if she can discontinue any of her medications.  She has prescriptions for gabapentin and montelukast that she is taking regularly and can even remember what they are for.  She does not think they are helping very much would like to stop them.  She also has chronic body aches and is wondering about other medications that may be causing this and what other medication she can stop.    Observations/Objective: There were no vitals taken for this visit. Wt Readings from  Last 5 Encounters:  04/02/19 167 lb (75.8 kg)  09/24/18 148 lb (67.1 kg)  08/17/18 145 lb (65.8 kg)  07/26/18 142 lb (64.4 kg)  06/26/18 141 lb (64 kg)   Exam: Normal Speech.  Psych: Alert and oriented.  Tearful at times.  Thought process is linear.  No SI or HI.    Lab and Radiology Results No results found for this or any previous visit (from the past 72 hour(s)). No results found.   Assessment and Plan: 85 y.o. female with anxiety and nausea.  Anxiety: Chronic significant problem.  Patient has had difficult to control anxiety for quite a while now.  She has been on multiple different medications.  She currently has been on a pretty reasonable regimen including Trintellix.  I am suspicious that her Trintellix may be causing her nausea and we are going to try stopping it for a little while.  In the meantime continue Xanax and Remeron.  Continue Seroquel at bedtime.  Consider increasing Seroquel to 50 mg or 100 mg at night.  Recheck back in about 10 days.  Nausea: Worsening recently.  Reviewed medications.  I am a bit suspicious for Trintellix is a possibility because of her nausea.  Plan to discontinue Trintellix.  Continue Zofran and Dramamine and Phenergan as needed.  Since we are talking about discharging continuing to try to stop medications will try stopping Seroquel as well as that may be contributing to her mood. Also will stop gabapentin as  it has not been helpful.  Recheck in 10 days as above.  PDMP not reviewed this encounter. No orders of the defined types were placed in this encounter.  No orders of the defined types were placed in this encounter.   Follow Up Instructions:    I discussed the assessment and treatment plan with the patient. The patient was provided an opportunity to ask questions and all were answered. The patient agreed with the plan and demonstrated an understanding of the instructions.   The patient was advised to call back or seek an in-person  evaluation if the symptoms worsen or if the condition fails to improve as anticipated.  Time: 25 minutes of intraservice time, with >39 minutes of total time during today's visit.      Historical information moved to improve visibility of documentation.  Past Medical History:  Diagnosis Date  . Anxiety   . Arthritis    osteoarthritis  . Asthma   . C. difficile diarrhea   . Candida esophagitis ([place])   . Chronic sinusitis   . Depression   . Depression   . Diverticulosis   . DJD (degenerative joint disease)     L5 compression fracture  . Edema extremities   . Fibromyalgia   . GERD (gastroesophageal reflux disease)   . Gout   . Hyperlipidemia   . Hypertension   . Osteoarthritis   . Osteopenia   . Panic disorder   . Renal failure, unspecified   . Spinal stenosis of lumbar region   . Tubular adenoma of colon   . Wrist fracture, left    Past Surgical History:  Procedure Laterality Date  . ADENOIDECTOMY    . CARPAL TUNNEL RELEASE  2007, 2009   Bilateral  . CATARACT EXTRACTION, BILATERAL    . CHOANAL ADENIODECTOMY    . DIAGNOSTIC LARYNGOSCOPY N/A 11/05/2015   Procedure: DIAGNOSTIC LARYNGOSCOPY AND ESOPHAGOSCOPY;  Surgeon: Rozetta Nunnery, MD;  Location: [facility];  Service: ENT;  Laterality: N/A;  . ganglion cyct removal on fingers     right  . ganglion cyst  wrist    . KNEE ARTHROSCOPY     right  . NASAL SINUS SURGERY    . OPEN REDUCTION INTERNAL FIXATION (ORIF) DISTAL RADIAL FRACTURE Left 12/18/2016   Procedure: OPEN REDUCTION INTERNAL FIXATION (ORIF) DISTAL RADIAL FRACTURE;  Surgeon: Iran Planas, MD;  Location: [facility];  Service: Orthopedics;  Laterality: Left;  . SHOULDER SURGERY  2004, 07/30/10, 01/2011   after her right humeral neck fracture, revision hemiarthroplasty 2004 for persistent pain, revision reverse arthroplasty December 2011 for persistent pain, incision and drainage with poly-exchange 01/26/2011 for possible prosthetic joint infection.  . TONSILLECTOMY  AND ADENOIDECTOMY    . TOTAL ABDOMINAL HYSTERECTOMY    . TOTAL HIP ARTHROPLASTY  2004   right  . TOTAL KNEE ARTHROPLASTY  2006   right  . TOTAL SHOULDER REPLACEMENT  2002   right   Social History   Tobacco Use  . Smoking status: Never Smoker  . Smokeless tobacco: Never Used  Substance Use Topics  . Alcohol use: No   family history includes Bladder Cancer in her father; Breast cancer in her unknown relative; Colon polyps in her unknown relative; Depression in her son; Diabetes in her mother; Hypertension in her mother.  Medications: Current Outpatient Medications  Medication Sig Dispense Refill  . albuterol (VENTOLIN HFA) 108 (90 Base) MCG/ACT inhaler Inhale 2 puffs into the lungs every 6 (six) hours as needed for wheezing or shortness  of breath. 6.7 g 0  . alendronate (FOSAMAX) 70 MG tablet Take 1 tablet (70 mg total) by mouth every 7 (seven) days. Take with a full glass of water on an empty stomach. 12 tablet 0  . ALPRAZolam (XANAX) 0.5 MG tablet Take 1 tablet (0.5 mg total) by mouth 4 (four) times daily. 360 tablet 0  . Calcium Carbonate-Vitamin D (CALCIUM 600+D) 600-400 MG-UNIT tablet Take 1 tablet by mouth 2 (two) times daily. 90 tablet 3  . cetirizine (ZYRTEC) 10 MG tablet Take 1 tablet (10 mg total) by mouth daily. 30 tablet 11  . diclofenac sodium (VOLTAREN) 1 % GEL Apply 4 g topically 4 (four) times daily. To affected joint. 500 g 99  . diphenhydrAMINE (BENADRYL) 25 MG tablet Take 25 mg by mouth every 6 (six) hours as needed. For allergies    . diphenoxylate-atropine (LOMOTIL) 2.5-0.025 MG tablet One to 2 tablets by mouth 4 times a day as needed for diarrhea. 90 tablet 3  . EPINEPHrine 0.3 mg/0.3 mL IJ SOAJ injection Inject 0.3 mLs (0.3 mg total) into the muscle as needed for anaphylaxis. 1 each 1  . fluticasone (FLONASE) 50 MCG/ACT nasal spray Place 2 sprays into both nostrils daily. 16 g 2  . furosemide (LASIX) 20 MG tablet Take 1 tablet (20 mg total) by mouth daily as  needed for fluid. 90 tablet 1  . ipratropium (ATROVENT) 0.06 % nasal spray USE 2 SPRAYS IN BOTH  NOSTRILS EVERY 4 HOURS AS  NEEDED 210 mL 1  . metroNIDAZOLE (METROGEL) 0.75 % gel Apply 1 application topically 2 (two) times daily. 45 g 3  . mirtazapine (REMERON) 30 MG tablet Take 1 tablet (30 mg total) by mouth at bedtime. 90 tablet 12  . mupirocin ointment (BACTROBAN) 2 % Apply to affected area TID for 7 days. 30 g 3  . nystatin (MYCOSTATIN/NYSTOP) powder Apply topically 4 (four) times daily. 60 g 3  . nystatin cream (MYCOSTATIN) Apply 1 application topically 2 (two) times daily. 30 g 12  . omeprazole (PRILOSEC) 20 MG capsule TAKE 1 CAPSULE BY MOUTH  DAILY 90 capsule 1  . ondansetron (ZOFRAN) 4 MG tablet TAKE 1 TABLET BY MOUTH EVERY 8 HOURS AS NEEDED FOR NAUSEA AND VOMITING 20 tablet 0  . ondansetron (ZOFRAN-ODT) 4 MG disintegrating tablet DISSOLVE 1 TABLET ON THE  TONGUE EVERY 8 HOURS AS  NEEDED FOR NAUSEA AND  VOMITING 270 tablet 0  . Oxycodone HCl 10 MG TABS Take 10mg  po 2-3x per day as needed for pain. 90 tablet 0  . polyethylene glycol (MIRALAX / GLYCOLAX) packet Take 17 g by mouth daily. 14 each 0  . promethazine (PHENERGAN) 25 MG tablet Take 1 tablet (25 mg total) by mouth every 6 (six) hours as needed for nausea or vomiting. 30 tablet 2  . QUEtiapine (SEROQUEL) 25 MG tablet Take 1 tablet (25 mg total) by mouth at bedtime. For insomnia 90 tablet 1  . simvastatin (ZOCOR) 10 MG tablet Take 1 tablet (10 mg total) by mouth daily. 90 tablet 3  . triamcinolone cream (KENALOG) 0.5 % Apply 1 application topically 2 (two) times daily. To affected areas. 30 g 3  . triamterene-hydrochlorothiazide (MAXZIDE-25) 37.5-25 MG tablet TAKE 1 TABLET BY MOUTH EVERY DAY GENERIC EQUIVALENT FOR MAXZIDE-25 90 tablet 3  . TRINTELLIX 5 MG TABS tablet Take 1 tablet (5 mg total) by mouth daily. 90 tablet 3   No current facility-administered medications for this visit.    Allergies  Allergen Reactions  .  Bee Venom  Anaphylaxis    Has epi-pen  . Ceftin [Cefuroxime Axetil] Anaphylaxis  . Ciprofloxacin Anaphylaxis and Palpitations  . Ephedrine Other (See Comments) and Palpitations    "knocks me out"  . Sulfonamide Derivatives Anaphylaxis  . Telithromycin Anaphylaxis    Reaction: also blurred vision   . Valsartan Anaphylaxis  . Verapamil Anaphylaxis  . Venlafaxine Other (See Comments)    Insomnia, panic  . Beta Adrenergic Blockers     bradycardia  . Cephalosporins     Allergic Reaction  . Clindamycin/Lincomycin     Dry skin and itching/ Cdiff  . Cymbalta [Duloxetine Hcl] Other (See Comments)    Reaction:Confusion and " I fall down"  . Doxazosin     "blurred vision and irritable"  . Hydralazine     HA, Diarrhea  . Lamotrigine     Patient unsure at this time  . Morphine Other (See Comments)    Medication has no effect with pain  . Pregabalin Swelling  . Prochlorperazine Edisylate   . Pseudoephedrine Other (See Comments)    Reaction: hyperventilates and blacks out  . Relafen [Nabumetone] Swelling  . Zoloft [Sertraline] Other (See Comments)    "nervous/jittery"  . Doxycycline Hives  . Wellbutrin [Bupropion] Other (See Comments)    Jittery

## 2019-05-20 ENCOUNTER — Ambulatory Visit (INDEPENDENT_AMBULATORY_CARE_PROVIDER_SITE_OTHER): Payer: Medicare Other | Admitting: Psychology

## 2019-05-20 DIAGNOSIS — F339 Major depressive disorder, recurrent, unspecified: Secondary | ICD-10-CM | POA: Diagnosis not present

## 2019-05-20 DIAGNOSIS — F419 Anxiety disorder, unspecified: Secondary | ICD-10-CM

## 2019-05-21 ENCOUNTER — Encounter: Payer: Self-pay | Admitting: Family Medicine

## 2019-05-21 ENCOUNTER — Ambulatory Visit (INDEPENDENT_AMBULATORY_CARE_PROVIDER_SITE_OTHER): Payer: Medicare Other | Admitting: Family Medicine

## 2019-05-21 ENCOUNTER — Other Ambulatory Visit: Payer: Self-pay

## 2019-05-21 VITALS — BP 160/94 | HR 100 | Temp 98.8°F

## 2019-05-21 DIAGNOSIS — R110 Nausea: Secondary | ICD-10-CM | POA: Diagnosis not present

## 2019-05-21 DIAGNOSIS — F411 Generalized anxiety disorder: Secondary | ICD-10-CM | POA: Diagnosis not present

## 2019-05-21 NOTE — Progress Notes (Signed)
Virtual Visit  I connected with      [name]  by a telemedicine application and verified that I am speaking with the correct person using two identifiers.   I discussed the limitations of evaluation and management by telemedicine and the availability of in person appointments. The patient expressed understanding and agreed to proceed.  History of Present Illness: [name] is a 85 y.o. female who would like to discuss anxiety and nausea.  [place] continues to experience significant problems with nausea and anxiety.  At the last visit on October 16 several of her medications were discontinued including Trintellix.  She notes that since she is discontinue Trintellix her anxiety has worsened and her nausea has not improved.  She would like to restart Trintellix.  She notes feeling very anxious this morning.  She is tried taking a Xanax which helps a little.  She spoke with her daughter who has been visiting her for the last 2 days and asked her to come back again.  Her daughter [name] lives in [place] and cannot easily help her mother in [place] area.  [name] asked me to speak to her daughter [name] [phone]).  I spoke with [name].  [name] is trying to get her mother into independent living closer to the [place] area which should be easier to help.  [name] also noted that her mom tends to drink a lot of soda and ascitic orange juice in the morning and thinks that may be contributing to her nausea.  Observations/Objective: BP (!) 160/94   Pulse 100   Temp 98.8 F (37.1 C) (Oral)  Wt Readings from Last 5 Encounters:  04/02/19 167 lb (75.8 kg)  09/24/18 148 lb (67.1 kg)  08/17/18 145 lb (65.8 kg)  07/26/18 142 lb (64.4 kg)  06/26/18 141 lb (64 kg)   Exam: Normal Speech.  Crying tearful affect.  Alert and oriented.  Lab and Radiology Results No results found for this or any previous visit (from the past 72 hour(s)). No results found.   Assessment and Plan: 85 y.o.  female with  Anxiety.  Exacerbation of chronic anxiety disorder.  Discussed treatment options.  Plan to restart Trintellix and continue to adjust Seroquel Remeron and Xanax.  Recheck as scheduled next week.  Precautions reviewed.  Nausea: Unclear fundamental etiology.  We will work this up further with labs and physical exam when assessed next week person.  Long-term plan: Fundamentally I do not think that [place] living by herself in [place] is going to be a good long-term plan.  She is struggled quite a bit since her husband died a few months ago.  Ultimately she will benefit from independent living or assisted living facility closer to where her family lives in [place]  PDMP not reviewed this encounter. No orders of the defined types were placed in this encounter.  No orders of the defined types were placed in this encounter.   Follow Up Instructions:    I discussed the assessment and treatment plan with the patient. The patient was provided an opportunity to ask questions and all were answered. The patient agreed with the plan and demonstrated an understanding of the instructions.   The patient was advised to call back or seek an in-person evaluation if the symptoms worsen or if the condition fails to improve as anticipated.  Time: 15 minutes of intraservice time, with >22 minutes of total time during today's visit.      Historical information moved  to improve visibility of documentation.  Past Medical History:  Diagnosis Date  . Anxiety   . Arthritis    osteoarthritis  . Asthma   . C. difficile diarrhea   . Candida esophagitis ([place])   . Chronic sinusitis   . Depression   . Depression   . Diverticulosis   . DJD (degenerative joint disease)     L5 compression fracture  . Edema extremities   . Fibromyalgia   . GERD (gastroesophageal reflux disease)   . Gout   . Hyperlipidemia   . Hypertension   . Osteoarthritis   . Osteopenia   . Panic disorder   .  Renal failure, unspecified   . Spinal stenosis of lumbar region   . Tubular adenoma of colon   . Wrist fracture, left    Past Surgical History:  Procedure Laterality Date  . ADENOIDECTOMY    . CARPAL TUNNEL RELEASE  2007, 2009   Bilateral  . CATARACT EXTRACTION, BILATERAL    . CHOANAL ADENIODECTOMY    . DIAGNOSTIC LARYNGOSCOPY N/A 11/05/2015   Procedure: DIAGNOSTIC LARYNGOSCOPY AND ESOPHAGOSCOPY;  Surgeon: Rozetta Nunnery, MD;  Location: [facility];  Service: ENT;  Laterality: N/A;  . ganglion cyct removal on fingers     right  . ganglion cyst  wrist    . KNEE ARTHROSCOPY     right  . NASAL SINUS SURGERY    . OPEN REDUCTION INTERNAL FIXATION (ORIF) DISTAL RADIAL FRACTURE Left 12/18/2016   Procedure: OPEN REDUCTION INTERNAL FIXATION (ORIF) DISTAL RADIAL FRACTURE;  Surgeon: Iran Planas, MD;  Location: [facility];  Service: Orthopedics;  Laterality: Left;  . SHOULDER SURGERY  2004, 07/30/10, 01/2011   after her right humeral neck fracture, revision hemiarthroplasty 2004 for persistent pain, revision reverse arthroplasty December 2011 for persistent pain, incision and drainage with poly-exchange 01/26/2011 for possible prosthetic joint infection.  . TONSILLECTOMY AND ADENOIDECTOMY    . TOTAL ABDOMINAL HYSTERECTOMY    . TOTAL HIP ARTHROPLASTY  2004   right  . TOTAL KNEE ARTHROPLASTY  2006   right  . TOTAL SHOULDER REPLACEMENT  2002   right   Social History   Tobacco Use  . Smoking status: Never Smoker  . Smokeless tobacco: Never Used  Substance Use Topics  . Alcohol use: No   family history includes Bladder Cancer in her father; Breast cancer in her unknown relative; Colon polyps in her unknown relative; Depression in her son; Diabetes in her mother; Hypertension in her mother.  Medications: Current Outpatient Medications  Medication Sig Dispense Refill  . albuterol (VENTOLIN HFA) 108 (90 Base) MCG/ACT inhaler Inhale 2 puffs into the lungs every 6 (six) hours as needed for wheezing  or shortness of breath. 6.7 g 0  . alendronate (FOSAMAX) 70 MG tablet Take 1 tablet (70 mg total) by mouth every 7 (seven) days. Take with a full glass of water on an empty stomach. 12 tablet 0  . ALPRAZolam (XANAX) 0.5 MG tablet Take 1 tablet (0.5 mg total) by mouth 4 (four) times daily. 360 tablet 0  . Calcium Carbonate-Vitamin D (CALCIUM 600+D) 600-400 MG-UNIT tablet Take 1 tablet by mouth 2 (two) times daily. 90 tablet 3  . cetirizine (ZYRTEC) 10 MG tablet Take 1 tablet (10 mg total) by mouth daily. 30 tablet 11  . diclofenac sodium (VOLTAREN) 1 % GEL Apply 4 g topically 4 (four) times daily. To affected joint. 500 g 99  . diphenhydrAMINE (BENADRYL) 25 MG tablet Take 25 mg by mouth every  6 (six) hours as needed. For allergies    . diphenoxylate-atropine (LOMOTIL) 2.5-0.025 MG tablet One to 2 tablets by mouth 4 times a day as needed for diarrhea. 90 tablet 3  . EPINEPHrine 0.3 mg/0.3 mL IJ SOAJ injection Inject 0.3 mLs (0.3 mg total) into the muscle as needed for anaphylaxis. 1 each 1  . fluticasone (FLONASE) 50 MCG/ACT nasal spray Place 2 sprays into both nostrils daily. 16 g 2  . furosemide (LASIX) 20 MG tablet Take 1 tablet (20 mg total) by mouth daily as needed for fluid. 90 tablet 1  . ipratropium (ATROVENT) 0.06 % nasal spray USE 2 SPRAYS IN BOTH  NOSTRILS EVERY 4 HOURS AS  NEEDED 210 mL 1  . metroNIDAZOLE (METROGEL) 0.75 % gel Apply 1 application topically 2 (two) times daily. 45 g 3  . mirtazapine (REMERON) 30 MG tablet Take 1 tablet (30 mg total) by mouth at bedtime. 90 tablet 12  . mupirocin ointment (BACTROBAN) 2 % Apply to affected area TID for 7 days. 30 g 3  . nystatin (MYCOSTATIN/NYSTOP) powder Apply topically 4 (four) times daily. 60 g 3  . nystatin cream (MYCOSTATIN) Apply 1 application topically 2 (two) times daily. 30 g 12  . omeprazole (PRILOSEC) 20 MG capsule TAKE 1 CAPSULE BY MOUTH  DAILY 90 capsule 1  . ondansetron (ZOFRAN) 4 MG tablet TAKE 1 TABLET BY MOUTH EVERY 8 HOURS  AS NEEDED FOR NAUSEA AND VOMITING 20 tablet 0  . ondansetron (ZOFRAN-ODT) 4 MG disintegrating tablet DISSOLVE 1 TABLET ON THE  TONGUE EVERY 8 HOURS AS  NEEDED FOR NAUSEA AND  VOMITING 270 tablet 0  . Oxycodone HCl 10 MG TABS Take 10mg  po 2-3x per day as needed for pain. 90 tablet 0  . polyethylene glycol (MIRALAX / GLYCOLAX) packet Take 17 g by mouth daily. 14 each 0  . promethazine (PHENERGAN) 25 MG tablet Take 1 tablet (25 mg total) by mouth every 6 (six) hours as needed for nausea or vomiting. 30 tablet 2  . QUEtiapine (SEROQUEL) 25 MG tablet Take 1 tablet (25 mg total) by mouth at bedtime. For insomnia 90 tablet 1  . simvastatin (ZOCOR) 10 MG tablet Take 1 tablet (10 mg total) by mouth daily. 90 tablet 3  . triamcinolone cream (KENALOG) 0.5 % Apply 1 application topically 2 (two) times daily. To affected areas. 30 g 3  . triamterene-hydrochlorothiazide (MAXZIDE-25) 37.5-25 MG tablet TAKE 1 TABLET BY MOUTH EVERY DAY GENERIC EQUIVALENT FOR MAXZIDE-25 90 tablet 3  . TRINTELLIX 5 MG TABS tablet Take 1 tablet (5 mg total) by mouth daily. 90 tablet 3   No current facility-administered medications for this visit.    Allergies  Allergen Reactions  . Bee Venom Anaphylaxis    Has epi-pen  . Ceftin [Cefuroxime Axetil] Anaphylaxis  . Ciprofloxacin Anaphylaxis and Palpitations  . Ephedrine Other (See Comments) and Palpitations    "knocks me out"  . Sulfonamide Derivatives Anaphylaxis  . Telithromycin Anaphylaxis    Reaction: also blurred vision   . Valsartan Anaphylaxis  . Verapamil Anaphylaxis  . Venlafaxine Other (See Comments)    Insomnia, panic  . Beta Adrenergic Blockers     bradycardia  . Cephalosporins     Allergic Reaction  . Clindamycin/Lincomycin     Dry skin and itching/ Cdiff  . Cymbalta [Duloxetine Hcl] Other (See Comments)    Reaction:Confusion and " I fall down"  . Doxazosin     "blurred vision and irritable"  . Hydralazine  HA, Diarrhea  . Lamotrigine     Patient  unsure at this time  . Morphine Other (See Comments)    Medication has no effect with pain  . Pregabalin Swelling  . Prochlorperazine Edisylate   . Pseudoephedrine Other (See Comments)    Reaction: hyperventilates and blacks out  . Relafen [Nabumetone] Swelling  . Zoloft [Sertraline] Other (See Comments)    "nervous/jittery"  . Doxycycline Hives  . Wellbutrin [Bupropion] Other (See Comments)    Jittery

## 2019-05-28 ENCOUNTER — Other Ambulatory Visit: Payer: Self-pay

## 2019-05-28 ENCOUNTER — Encounter: Payer: Self-pay | Admitting: Family Medicine

## 2019-05-28 ENCOUNTER — Ambulatory Visit (INDEPENDENT_AMBULATORY_CARE_PROVIDER_SITE_OTHER): Payer: Medicare Other | Admitting: Family Medicine

## 2019-05-28 VITALS — BP 114/78 | HR 102 | Temp 98.4°F | Wt 163.0 lb

## 2019-05-28 DIAGNOSIS — E785 Hyperlipidemia, unspecified: Secondary | ICD-10-CM | POA: Diagnosis not present

## 2019-05-28 DIAGNOSIS — R110 Nausea: Secondary | ICD-10-CM

## 2019-05-28 DIAGNOSIS — N1831 Chronic kidney disease, stage 3a: Secondary | ICD-10-CM

## 2019-05-28 DIAGNOSIS — I10 Essential (primary) hypertension: Secondary | ICD-10-CM | POA: Diagnosis not present

## 2019-05-28 DIAGNOSIS — K219 Gastro-esophageal reflux disease without esophagitis: Secondary | ICD-10-CM

## 2019-05-28 DIAGNOSIS — M069 Rheumatoid arthritis, unspecified: Secondary | ICD-10-CM

## 2019-05-28 DIAGNOSIS — R7303 Prediabetes: Secondary | ICD-10-CM

## 2019-05-28 DIAGNOSIS — F411 Generalized anxiety disorder: Secondary | ICD-10-CM

## 2019-05-28 MED ORDER — TRINTELLIX 5 MG PO TABS: 5.0000 mg | ORAL_TABLET | Freq: Every day | ORAL | 3 refills | Status: DC

## 2019-05-28 MED ORDER — MIRTAZAPINE 45 MG PO TABS: 45.0000 mg | ORAL_TABLET | Freq: Every day | ORAL | 3 refills | Status: AC

## 2019-05-28 MED ORDER — ONDANSETRON HCL 4 MG PO TABS: ORAL_TABLET | ORAL | 6 refills | Status: DC

## 2019-05-28 MED ORDER — AZELASTINE HCL 0.1 % NA SOLN: 2.0000 | Freq: Two times a day (BID) | NASAL | 12 refills | Status: AC

## 2019-05-28 MED ORDER — OXYCODONE HCL 10 MG PO TABS: ORAL_TABLET | ORAL | 0 refills | Status: DC

## 2019-05-28 MED ORDER — ALPRAZOLAM 0.5 MG PO TABS: 0.5000 mg | ORAL_TABLET | Freq: Four times a day (QID) | ORAL | 1 refills | Status: AC

## 2019-05-28 NOTE — Progress Notes (Signed)
[name] is a 85 y.o. female who presents to [place]: [facility] today for follow-up anxiety nausea discuss scratch on leg, toenails, hypertension.  Anxiety: Chronic struggles with anxiety have not improved recently.  She has been seen multiple times and is currently receiving counseling.  She currently taking Trintellix 5 and mirtazapine 30 mg as well as Xanax.  She notes she continues to struggle quite a bit.  She also notes continued struggle with sleep.  Seroquel has not been helpful at all for this and she would like to discontinue it.  Additionally she needs her Trintellix patient assistant forms refilled out.  Additionally she notes persistent nausea.  She notes this is manageable with Zofran but still quite problematic.  She is had trials of stopping some of her medications which have not helped.  She denies any vomiting or weight loss.  She notes a bothersome nasal drainage.  She is used Atrovent nasal spray which did not help as well as Flonase which did not help.  She tried Astelin in the past and would like to try that.  Additionally she developed a small scratch on her right leg.  This recurred about a month ago and is slowly healing with Bactroban.  Additionally she notes continued chronic pain.  She would like to continue her oxycodone as it is helpful.  She tolerates it well.  She notes her toenails are a bit long and she cannot trim them herself.  Additionally she is planning on moving to [place] where she can be closer to her family.  She is looking for independent or assisted living.  .   ROS as above:  Exam:  BP 114/78   Pulse (!) 102   Temp 98.4 F (36.9 C) (Oral)   Wt 163 lb (73.9 kg)   BMI 31.83 kg/m  Wt Readings from Last 5 Encounters:  05/28/19 163 lb (73.9 kg)  04/02/19 167 lb (75.8 kg)  09/24/18 148 lb (67.1 kg)  08/17/18 145 lb (65.8  kg)  07/26/18 142 lb (64.4 kg)    Gen: Well NAD HEENT: EOMI,  MMM Lungs: Normal work of breathing. CTABL Heart: RRR no MRG Abd: NABS, Soft. Nondistended, Nontender Exts: Brisk capillary refill, warm and well perfused.  Skin: Right shin small healing wound with no surrounding erythema.  Nontender. No drainage. Feet bilaterally with elongated toenails.  Some are thickened and yellowed.  No skin wounds. Psych alert and oriented normal speech thought process and affect.    Assessment and Plan: 85 y.o. female with  Anxiety: Continues to be persistent problem.  Plan to continue modified her medication.  Will discontinue Seroquel as it is not helpful.  Continue Trintellix and Xanax.  Will increase mirtazapine.  Patient has scheduled follow-up with Dr. Dianah Field in about a week.  We will follow-up then as well.  Continue therapy.  Nausea: Unclear etiology but persistent problem.  At this point plan to proceed with limited metabolic work-up as below and refer to gastroenterology.  She may benefit from other work-up or evaluation or thoughts.  Chronic pain: Refill oxycodone.  Nasal drainage.  Trial of Astelin nasal spray.  Toenails: Trimmed today.  Long-term plan: Moving to [facility] is a great idea.  We will continue to manage here until either transition to Dr. Zigmund Daniel in February or moving to [place].  PDMP not reviewed this encounter. Orders Placed This Encounter  Procedures  . COMPLETE METABOLIC PANEL WITH GFR  .  Lipase  . TSH  . Hemoglobin A1c  . CBC with Differential/Platelet  . Ambulatory referral to Gastroenterology    Referral Priority:   Routine    Referral Type:   Consultation    Referral Reason:   Specialty Services Required    Referred to Provider:   Lavena Bullion, DO    Number of Visits Requested:   1   Meds ordered this encounter  Medications  . ALPRAZolam (XANAX) 0.5 MG tablet    Sig: Take 1 tablet (0.5 mg total) by mouth 4 (four) times daily.     Dispense:  360 tablet    Refill:  1    Up date dose 4x daily  . Oxycodone HCl 10 MG TABS    Sig: Take 10mg  po 2-3x per day as needed for pain.    Dispense:  90 tablet    Refill:  0    Mail order. Cant leave house due to COVID-19  . ondansetron (ZOFRAN) 4 MG tablet    Sig: TAKE 1 TABLET BY MOUTH EVERY 8 HOURS AS NEEDED FOR NAUSEA AND VOMITING    Dispense:  20 tablet    Refill:  6  . TRINTELLIX 5 MG TABS tablet    Sig: Take 1 tablet (5 mg total) by mouth daily.    Dispense:  90 tablet    Refill:  3  . mirtazapine (REMERON) 45 MG tablet    Sig: Take 1 tablet (45 mg total) by mouth at bedtime.    Dispense:  90 tablet    Refill:  3    OK to dispense ODT form if cheaper or desired  . azelastine (ASTELIN) 0.1 % nasal spray    Sig: Place 2 sprays into both nostrils 2 (two) times daily. Use in each nostril as directed    Dispense:  30 mL    Refill:  12     Historical information moved to improve visibility of documentation.  Past Medical History:  Diagnosis Date  . Anxiety   . Arthritis    osteoarthritis  . Asthma   . C. difficile diarrhea   . Candida esophagitis ([place])   . Chronic sinusitis   . Depression   . Depression   . Diverticulosis   . DJD (degenerative joint disease)     L5 compression fracture  . Edema extremities   . Fibromyalgia   . GERD (gastroesophageal reflux disease)   . Gout   . Hyperlipidemia   . Hypertension   . Osteoarthritis   . Osteopenia   . Panic disorder   . Renal failure, unspecified   . Spinal stenosis of lumbar region   . Tubular adenoma of colon   . Wrist fracture, left    Past Surgical History:  Procedure Laterality Date  . ADENOIDECTOMY    . CARPAL TUNNEL RELEASE  2007, 2009   Bilateral  . CATARACT EXTRACTION, BILATERAL    . CHOANAL ADENIODECTOMY    . DIAGNOSTIC LARYNGOSCOPY N/A 11/05/2015   Procedure: DIAGNOSTIC LARYNGOSCOPY AND ESOPHAGOSCOPY;  Surgeon: Rozetta Nunnery, MD;  Location: [facility];  Service: ENT;  Laterality: N/A;   . ganglion cyct removal on fingers     right  . ganglion cyst  wrist    . KNEE ARTHROSCOPY     right  . NASAL SINUS SURGERY    . OPEN REDUCTION INTERNAL FIXATION (ORIF) DISTAL RADIAL FRACTURE Left 12/18/2016   Procedure: OPEN REDUCTION INTERNAL FIXATION (ORIF) DISTAL RADIAL FRACTURE;  Surgeon: Iran Planas, MD;  Location: [facility];  Service: Orthopedics;  Laterality: Left;  . SHOULDER SURGERY  2004, 07/30/10, 01/2011   after her right humeral neck fracture, revision hemiarthroplasty 2004 for persistent pain, revision reverse arthroplasty December 2011 for persistent pain, incision and drainage with poly-exchange 01/26/2011 for possible prosthetic joint infection.  . TONSILLECTOMY AND ADENOIDECTOMY    . TOTAL ABDOMINAL HYSTERECTOMY    . TOTAL HIP ARTHROPLASTY  2004   right  . TOTAL KNEE ARTHROPLASTY  2006   right  . TOTAL SHOULDER REPLACEMENT  2002   right   Social History   Tobacco Use  . Smoking status: Never Smoker  . Smokeless tobacco: Never Used  Substance Use Topics  . Alcohol use: No   family history includes Bladder Cancer in her father; Breast cancer in her unknown relative; Colon polyps in her unknown relative; Depression in her son; Diabetes in her mother; Hypertension in her mother.  Medications: Current Outpatient Medications  Medication Sig Dispense Refill  . albuterol (VENTOLIN HFA) 108 (90 Base) MCG/ACT inhaler Inhale 2 puffs into the lungs every 6 (six) hours as needed for wheezing or shortness of breath. 6.7 g 0  . alendronate (FOSAMAX) 70 MG tablet Take 1 tablet (70 mg total) by mouth every 7 (seven) days. Take with a full glass of water on an empty stomach. 12 tablet 0  . ALPRAZolam (XANAX) 0.5 MG tablet Take 1 tablet (0.5 mg total) by mouth 4 (four) times daily. 360 tablet 1  . azelastine (ASTELIN) 0.1 % nasal spray Place 2 sprays into both nostrils 2 (two) times daily. Use in each nostril as directed 30 mL 12  . Calcium Carbonate-Vitamin D (CALCIUM 600+D) 600-400  MG-UNIT tablet Take 1 tablet by mouth 2 (two) times daily. 90 tablet 3  . cetirizine (ZYRTEC) 10 MG tablet Take 1 tablet (10 mg total) by mouth daily. 30 tablet 11  . diclofenac sodium (VOLTAREN) 1 % GEL Apply 4 g topically 4 (four) times daily. To affected joint. 500 g 99  . diphenhydrAMINE (BENADRYL) 25 MG tablet Take 25 mg by mouth every 6 (six) hours as needed. For allergies    . diphenoxylate-atropine (LOMOTIL) 2.5-0.025 MG tablet One to 2 tablets by mouth 4 times a day as needed for diarrhea. 90 tablet 3  . EPINEPHrine 0.3 mg/0.3 mL IJ SOAJ injection Inject 0.3 mLs (0.3 mg total) into the muscle as needed for anaphylaxis. 1 each 1  . furosemide (LASIX) 20 MG tablet Take 1 tablet (20 mg total) by mouth daily as needed for fluid. 90 tablet 1  . metroNIDAZOLE (METROGEL) 0.75 % gel Apply 1 application topically 2 (two) times daily. 45 g 3  . mirtazapine (REMERON) 45 MG tablet Take 1 tablet (45 mg total) by mouth at bedtime. 90 tablet 3  . mupirocin ointment (BACTROBAN) 2 % Apply to affected area TID for 7 days. 30 g 3  . nystatin (MYCOSTATIN/NYSTOP) powder Apply topically 4 (four) times daily. 60 g 3  . nystatin cream (MYCOSTATIN) Apply 1 application topically 2 (two) times daily. 30 g 12  . omeprazole (PRILOSEC) 20 MG capsule TAKE 1 CAPSULE BY MOUTH  DAILY 90 capsule 1  . ondansetron (ZOFRAN) 4 MG tablet TAKE 1 TABLET BY MOUTH EVERY 8 HOURS AS NEEDED FOR NAUSEA AND VOMITING 20 tablet 6  . ondansetron (ZOFRAN-ODT) 4 MG disintegrating tablet DISSOLVE 1 TABLET ON THE  TONGUE EVERY 8 HOURS AS  NEEDED FOR NAUSEA AND  VOMITING 270 tablet 0  . Oxycodone HCl 10 MG  TABS Take 10mg  po 2-3x per day as needed for pain. 90 tablet 0  . polyethylene glycol (MIRALAX / GLYCOLAX) packet Take 17 g by mouth daily. 14 each 0  . promethazine (PHENERGAN) 25 MG tablet Take 1 tablet (25 mg total) by mouth every 6 (six) hours as needed for nausea or vomiting. 30 tablet 2  . triamcinolone cream (KENALOG) 0.5 % Apply 1  application topically 2 (two) times daily. To affected areas. 30 g 3  . triamterene-hydrochlorothiazide (MAXZIDE-25) 37.5-25 MG tablet TAKE 1 TABLET BY MOUTH EVERY DAY GENERIC EQUIVALENT FOR MAXZIDE-25 90 tablet 3  . TRINTELLIX 5 MG TABS tablet Take 1 tablet (5 mg total) by mouth daily. 90 tablet 3   No current facility-administered medications for this visit.    Allergies  Allergen Reactions  . Bee Venom Anaphylaxis    Has epi-pen  . Ceftin [Cefuroxime Axetil] Anaphylaxis  . Ciprofloxacin Anaphylaxis and Palpitations  . Ephedrine Other (See Comments) and Palpitations    "knocks me out"  . Sulfonamide Derivatives Anaphylaxis  . Telithromycin Anaphylaxis    Reaction: also blurred vision   . Valsartan Anaphylaxis  . Verapamil Anaphylaxis  . Venlafaxine Other (See Comments)    Insomnia, panic  . Beta Adrenergic Blockers     bradycardia  . Cephalosporins     Allergic Reaction  . Clindamycin/Lincomycin     Dry skin and itching/ Cdiff  . Cymbalta [Duloxetine Hcl] Other (See Comments)    Reaction:Confusion and " I fall down"  . Doxazosin     "blurred vision and irritable"  . Hydralazine     HA, Diarrhea  . Lamotrigine     Patient unsure at this time  . Morphine Other (See Comments)    Medication has no effect with pain  . Pregabalin Swelling  . Prochlorperazine Edisylate   . Pseudoephedrine Other (See Comments)    Reaction: hyperventilates and blacks out  . Relafen [Nabumetone] Swelling  . Zoloft [Sertraline] Other (See Comments)    "nervous/jittery"  . Doxycycline Hives  . Wellbutrin [Bupropion] Other (See Comments)    Jittery     Discussed warning signs or symptoms. Please see discharge instructions. Patient expresses understanding.

## 2019-05-28 NOTE — Patient Instructions (Addendum)
Thank you for coming in today. STOP seroquel.  Increase mirtazapine to 45mg  at night.  You should hear from Gastroenterology soon.   Try astelin nasal spray.   Get labs today.   When you are getting ready to move to [facility] make sur to establish care with a doctor nearly.

## 2019-05-29 LAB — CBC WITH DIFFERENTIAL/PLATELET
Absolute Monocytes: 774 cells/uL (ref 200–950)
Basophils Absolute: 44 cells/uL (ref 0–200)
Basophils Relative: 0.5 %
Eosinophils Absolute: 70 cells/uL (ref 15–500)
Eosinophils Relative: 0.8 %
HCT: 42.6 % (ref 35.0–45.0)
Hemoglobin: 14.2 g/dL (ref 11.7–15.5)
Lymphs Abs: 2079 cells/uL (ref 850–3900)
MCH: 29.4 pg (ref 27.0–33.0)
MCHC: 33.3 g/dL (ref 32.0–36.0)
MCV: 88.2 fL (ref 80.0–100.0)
MPV: 10.9 fL (ref 7.5–12.5)
Monocytes Relative: 8.9 %
Neutro Abs: 5733 cells/uL (ref 1500–7800)
Neutrophils Relative %: 65.9 %
Platelets: 274 10*3/uL (ref 140–400)
RBC: 4.83 10*6/uL (ref 3.80–5.10)
RDW: 12.9 % (ref 11.0–15.0)
Total Lymphocyte: 23.9 %
WBC: 8.7 10*3/uL (ref 3.8–10.8)

## 2019-05-29 LAB — COMPLETE METABOLIC PANEL WITH GFR
AG Ratio: 1.4 (calc) (ref 1.0–2.5)
ALT: 20 U/L (ref 6–29)
AST: 28 U/L (ref 10–35)
Albumin: 4.4 g/dL (ref 3.6–5.1)
Alkaline phosphatase (APISO): 105 U/L (ref 37–153)
BUN/Creatinine Ratio: 13 (calc) (ref 6–22)
BUN: 18 mg/dL (ref 7–25)
CO2: 26 mmol/L (ref 20–32)
Calcium: 10.3 mg/dL (ref 8.6–10.4)
Chloride: 95 mmol/L — ABNORMAL LOW (ref 98–110)
Creat: 1.43 mg/dL — ABNORMAL HIGH (ref 0.60–0.88)
GFR, Est African American: 39 mL/min/{1.73_m2} — ABNORMAL LOW (ref >=60)
GFR, Est Non African American: 34 mL/min/{1.73_m2} — ABNORMAL LOW (ref >=60)
Globulin: 3.1 g/dL (calc) (ref 1.9–3.7)
Glucose, Bld: 103 mg/dL — ABNORMAL HIGH (ref 65–99)
Potassium: 4.6 mmol/L (ref 3.5–5.3)
Sodium: 134 mmol/L — ABNORMAL LOW (ref 135–146)
Total Bilirubin: 0.5 mg/dL (ref 0.2–1.2)
Total Protein: 7.5 g/dL (ref 6.1–8.1)

## 2019-05-29 LAB — HEMOGLOBIN A1C
Hgb A1c MFr Bld: 5.5 % of total Hgb (ref <5.7)
Mean Plasma Glucose: 111 (calc)
eAG (mmol/L): 6.2 (calc)

## 2019-05-29 LAB — TSH: TSH: 3.14 mIU/L (ref 0.40–4.50)

## 2019-05-29 LAB — LIPASE: Lipase: 11 U/L (ref 7–60)

## 2019-05-31 ENCOUNTER — Other Ambulatory Visit: Payer: Self-pay | Admitting: Family Medicine

## 2019-06-03 ENCOUNTER — Encounter: Payer: Self-pay | Admitting: Gastroenterology

## 2019-06-04 ENCOUNTER — Other Ambulatory Visit: Payer: Self-pay

## 2019-06-04 ENCOUNTER — Encounter: Payer: Self-pay | Admitting: Sports Medicine

## 2019-06-04 ENCOUNTER — Ambulatory Visit (INDEPENDENT_AMBULATORY_CARE_PROVIDER_SITE_OTHER): Payer: Medicare Other | Admitting: Sports Medicine

## 2019-06-04 DIAGNOSIS — R110 Nausea: Secondary | ICD-10-CM | POA: Diagnosis not present

## 2019-06-04 DIAGNOSIS — M1712 Unilateral primary osteoarthritis, left knee: Secondary | ICD-10-CM | POA: Diagnosis not present

## 2019-06-04 MED ORDER — ONDANSETRON 8 MG PO TBDP: 8.0000 mg | ORAL_TABLET | Freq: Three times a day (TID) | ORAL | 3 refills | Status: AC | PRN

## 2019-06-04 NOTE — Progress Notes (Signed)
Subjective:    CC: L knee OA  HPI: [name] is a 85 year old with a history of OA of the L knee who presents today following a failed steroid knee injection reporting no relief. The knee has been injected multiple times as well as a series of viscosupplementation. She does not want to undergo a knee replacement due to her advanced age. She agreed that another round of joint fluid replacement would be her best chance at relief with the next step being RFA. Additionally, she reports chronic abdominal pain that is poorly managed with her 4 mg zofran she agreed to start a trial of 8 mg.   I reviewed the past medical history, family history, social history, surgical history, and allergies today and no changes were needed.  Please see the problem list section below in epic for further details.  Past Medical History: Past Medical History:  Diagnosis Date  . Anxiety   . Arthritis    osteoarthritis  . Asthma   . C. difficile diarrhea   . Candida esophagitis ([place])   . Chronic sinusitis   . Depression   . Depression   . Diverticulosis   . DJD (degenerative joint disease)     L5 compression fracture  . Edema extremities   . Fibromyalgia   . GERD (gastroesophageal reflux disease)   . Gout   . Hyperlipidemia   . Hypertension   . Osteoarthritis   . Osteopenia   . Panic disorder   . Renal failure, unspecified   . Spinal stenosis of lumbar region   . Tubular adenoma of colon   . Wrist fracture, left    Past Surgical History: Past Surgical History:  Procedure Laterality Date  . ADENOIDECTOMY    . CARPAL TUNNEL RELEASE  2007, 2009   Bilateral  . CATARACT EXTRACTION, BILATERAL    . CHOANAL ADENIODECTOMY    . DIAGNOSTIC LARYNGOSCOPY N/A 11/05/2015   Procedure: DIAGNOSTIC LARYNGOSCOPY AND ESOPHAGOSCOPY;  Surgeon: Rozetta Nunnery, MD;  Location: [facility];  Service: ENT;  Laterality: N/A;  . ganglion cyct removal on fingers     right  . ganglion cyst  wrist    . KNEE ARTHROSCOPY     right  . NASAL SINUS SURGERY    . OPEN REDUCTION INTERNAL FIXATION (ORIF) DISTAL RADIAL FRACTURE Left 12/18/2016   Procedure: OPEN REDUCTION INTERNAL FIXATION (ORIF) DISTAL RADIAL FRACTURE;  Surgeon: Iran Planas, MD;  Location: [facility];  Service: Orthopedics;  Laterality: Left;  . SHOULDER SURGERY  2004, 07/30/10, 01/2011   after her right humeral neck fracture, revision hemiarthroplasty 2004 for persistent pain, revision reverse arthroplasty December 2011 for persistent pain, incision and drainage with poly-exchange 01/26/2011 for possible prosthetic joint infection.  . TONSILLECTOMY AND ADENOIDECTOMY    . TOTAL ABDOMINAL HYSTERECTOMY    . TOTAL HIP ARTHROPLASTY  2004   right  . TOTAL KNEE ARTHROPLASTY  2006   right  . TOTAL SHOULDER REPLACEMENT  2002   right   Social History: Social History   Socioeconomic History  . Marital status: Widowed    Spouse name: Not on file  . Number of children: Not on file  . Years of education: Not on file  . Highest education level: Not on file  Occupational History  . Not on file  Social Needs  . Financial resource strain: Not on file  . Food insecurity    Worry: Not on file    Inability: Not on file  . Transportation needs  Medical: Not on file    Non-medical: Not on file  Tobacco Use  . Smoking status: Never Smoker  . Smokeless tobacco: Never Used  Substance and Sexual Activity  . Alcohol use: No  . Drug use: No  . Sexual activity: Never  Lifestyle  . Physical activity    Days per week: Not on file    Minutes per session: Not on file  . Stress: Not on file  Relationships  . Social Herbalist on phone: Not on file    Gets together: Not on file    Attends religious service: Not on file    Active member of club or organization: Not on file    Attends meetings of clubs or organizations: Not on file    Relationship status: Not on file  Other Topics Concern  . Not on file  Social History Narrative  . Not on file    Family History: Family History  Problem Relation Age of Onset  . Bladder Cancer Father   . Diabetes Mother   . Hypertension Mother   . Breast cancer Unknown   . Colon polyps Unknown   . Depression Son    Allergies: Allergies  Allergen Reactions  . Bee Venom Anaphylaxis    Has epi-pen  . Ceftin [Cefuroxime Axetil] Anaphylaxis  . Ciprofloxacin Anaphylaxis and Palpitations  . Ephedrine Other (See Comments) and Palpitations    "knocks me out"  . Sulfonamide Derivatives Anaphylaxis  . Telithromycin Anaphylaxis    Reaction: also blurred vision   . Valsartan Anaphylaxis  . Verapamil Anaphylaxis  . Venlafaxine Other (See Comments)    Insomnia, panic  . Beta Adrenergic Blockers     bradycardia  . Cephalosporins     Allergic Reaction  . Clindamycin/Lincomycin     Dry skin and itching/ Cdiff  . Cymbalta [Duloxetine Hcl] Other (See Comments)    Reaction:Confusion and " I fall down"  . Doxazosin     "blurred vision and irritable"  . Hydralazine     HA, Diarrhea  . Lamotrigine     Patient unsure at this time  . Morphine Other (See Comments)    Medication has no effect with pain  . Pregabalin Swelling  . Prochlorperazine Edisylate   . Pseudoephedrine Other (See Comments)    Reaction: hyperventilates and blacks out  . Relafen [Nabumetone] Swelling  . Zoloft [Sertraline] Other (See Comments)    "nervous/jittery"  . Doxycycline Hives  . Wellbutrin [Bupropion] Other (See Comments)    Jittery   Medications: See med rec.  Review of Systems: No fevers, chills, night sweats, weight loss, chest pain, or shortness of breath.   Objective:    General: Well Developed, well nourished, and in no acute distress. Using [place] for ambulation. Neuro: Alert and oriented x3, extra-ocular muscles intact, sensation grossly intact.  HEENT: Normocephalic, atraumatic, pupils equal round reactive to light..  Skin: Warm and dry, no rashes. Cardiac: Regular rate and rhythm.  Respiratory: Not  using accessory muscles, speaking in full sentences.  L Knee: Medial osteophytes and mild effusion noted on inspection no erythema. Joint line tenderness. No patellar tenderness, or condyle tenderness. Non painful patellar compression. Patellar glide notable for crepitus..  A/P: [name] has a significant history of L knee pain due to primary OA she has recently failed a steroid injection of the joint with no relief of symptoms. She does not want to undergo replacement surgery. With shared decision making a series of viscosupplementation will be filed with  insurance to offer her some pain relief. If unsuccessful RFA will be the next step. Ondansetron was increased from 4 to 8 mg for control of her nausea symptoms.  Impression and Recommendations:    Primary osteoarthritis of left knee We did Orthovisc back in 2017, she is had several steroid injections since then without much improvement. I am going to get her approved again for Orthovisc. If this fails we will consider genicular radiofrequency ablation, she is not interested in arthroplasty.  Chronic nausea Does have an upcoming visit with gastroenterology. Increasing Zofran to 8 mg 3 times daily until then. She is going to establish with Dr. Luetta Nutting when he arrives.   ___________________________________________ Gwen Her. Dianah Field, M.D., ABFM., CAQSM. Primary Care and Sports Medicine [facility]  Adjunct Professor of [place] [employer] [facility] [employer] [facility] [facility] [employer]

## 2019-06-04 NOTE — Assessment & Plan Note (Signed)
We did Orthovisc back in 2017, she is had several steroid injections since then without much improvement. I am going to get her approved again for Orthovisc. If this fails we will consider genicular radiofrequency ablation, she is not interested in arthroplasty.

## 2019-06-04 NOTE — Assessment & Plan Note (Addendum)
Does have an upcoming visit with gastroenterology. Increasing Zofran to 8 mg 3 times daily until then. She is going to establish with Dr. Luetta Nutting when he arrives.

## 2019-06-05 ENCOUNTER — Telehealth: Payer: Self-pay | Admitting: Sports Medicine

## 2019-06-05 NOTE — Telephone Encounter (Signed)
-----   Message from Tasia Catchings, [place] sent at 06/05/2019  4:57 PM EST ----- Information has been submitted to Orthovisc and awaiting determination.   ----- Message ----- From: Silverio Decamp, MD Sent: 06/04/2019  11:04 AM EST To: Tasia Catchings, CMA  Orthovisc approval please, left knee, failed everything else. ___________________________________________Thomas J. Dianah Field, M.D., ABFM., CAQSM.Primary Care and Sports MedicineC[facility] MedCenter KernersvilleAdjunct Professor of [place] [employer] [facility] [employer]

## 2019-06-06 ENCOUNTER — Ambulatory Visit (INDEPENDENT_AMBULATORY_CARE_PROVIDER_SITE_OTHER): Payer: Medicare Other | Admitting: Psychology

## 2019-06-06 DIAGNOSIS — F419 Anxiety disorder, unspecified: Secondary | ICD-10-CM

## 2019-06-06 NOTE — Telephone Encounter (Signed)
No Subject Posted by: [facility]  Case Closed : Covered  Buy and bill available. Closing case.  I received an estimate of 20% of the injections and patient is not able to pay the estimate. Closing encounter.

## 2019-06-07 ENCOUNTER — Other Ambulatory Visit: Payer: Self-pay

## 2019-06-07 ENCOUNTER — Encounter: Payer: Self-pay | Admitting: Sports Medicine

## 2019-06-07 DIAGNOSIS — M1712 Unilateral primary osteoarthritis, left knee: Secondary | ICD-10-CM

## 2019-06-07 MED ORDER — DIPHENOXYLATE-ATROPINE 2.5-0.025 MG PO TABS: ORAL_TABLET | ORAL | 3 refills | Status: AC

## 2019-06-07 NOTE — Telephone Encounter (Signed)
Requesting RF on Lomotil  Patient states Dr T is her new PCP... forwarding request  RX pended

## 2019-06-07 NOTE — Telephone Encounter (Signed)
I am actually not her new PCP, but I did say that I was covering Dr. Georgina Snell until she can find a new one.

## 2019-06-14 ENCOUNTER — Ambulatory Visit (INDEPENDENT_AMBULATORY_CARE_PROVIDER_SITE_OTHER): Payer: Medicare Other | Admitting: Psychology

## 2019-06-14 DIAGNOSIS — F339 Major depressive disorder, recurrent, unspecified: Secondary | ICD-10-CM | POA: Diagnosis not present

## 2019-06-14 DIAGNOSIS — F419 Anxiety disorder, unspecified: Secondary | ICD-10-CM

## 2019-06-17 ENCOUNTER — Telehealth: Payer: Self-pay | Admitting: Sports Medicine

## 2019-06-17 NOTE — Telephone Encounter (Signed)
Patient called and is having some increased heart rate that is staying in the 90's and 100's. She is requesting a follow up late to mid morning on 06/19/2019 with Iran Planas.

## 2019-06-19 ENCOUNTER — Ambulatory Visit (INDEPENDENT_AMBULATORY_CARE_PROVIDER_SITE_OTHER): Payer: Medicare Other | Admitting: Physician Assistant

## 2019-06-19 ENCOUNTER — Encounter: Payer: Self-pay | Admitting: Physician Assistant

## 2019-06-19 ENCOUNTER — Other Ambulatory Visit: Payer: Self-pay

## 2019-06-19 VITALS — BP 132/78 | HR 93 | Ht 60.0 in | Wt 161.0 lb

## 2019-06-19 DIAGNOSIS — I10 Essential (primary) hypertension: Secondary | ICD-10-CM | POA: Diagnosis not present

## 2019-06-19 DIAGNOSIS — R000 Tachycardia, unspecified: Secondary | ICD-10-CM | POA: Insufficient documentation

## 2019-06-19 DIAGNOSIS — F411 Generalized anxiety disorder: Secondary | ICD-10-CM

## 2019-06-19 NOTE — Progress Notes (Signed)
Subjective:    Patient ID: [name], female    DOB: 12/02/1932, 85 y.o.   MRN: [id number]  HPI Patient is a 85 year old female with a history of hypertension, GERD, anxiety.  She is accompanied by her daughter.  She presents to the clinic to discuss her concern for her heart rate.  Patient was seen Dr. Georgina Snell before she left this clinic.  This is my first time seeing her.  Patient has a long history of anxiety and she does take Xanax 4 times a day.  She is also on Trintellix and Remeron.  She feels like she worries about everything.  Her husband recently passed away in 28-Feb-2023.  She continues to live in their house and feels like she has a lot of responsibility.  They are going to sell the house and she is going to move closer to her daughters in [place].  She notices that her heart rate will go up to 100-1 10.  She denies any feeling of palpitations, dizziness, chest pains.  She denies any swelling of her extremities.  She denies any orthopnea.  She does not have any significant shortness of breath.   .. Active Ambulatory Problems    Diagnosis Date Noted  . HTN (hypertension) 03/31/2011  . Dyslipidemia 03/31/2011  . MDD (major depressive disorder) 03/31/2011  . Fibromyalgia 03/31/2011  . DJD (degenerative joint disease) 03/31/2011  . GERD (gastroesophageal reflux disease)   . Bradycardia 06/03/2013  . Chronic rheumatic arthritis ([place]) 02/19/2014  . GAD (generalized anxiety disorder) 02/19/2014  . Osteoporosis 03/12/2014  . Personal history of colonic polyps 03/12/2014  . Constipation 05/23/2014  . Primary osteoarthritis of left knee 01/01/2015  . Seasonal allergies 02/28/2015  . Prediabetes 02-28-16  . Chronic kidney disease (CKD), stage III (moderate) 28-Feb-2016  . History of total right hip arthroplasty 06/17/2016  . Status post total shoulder arthroplasty, right 11/11/2016  . S/P total knee arthroplasty, right 11/11/2016  . Lumbago 12/08/2016  . Left wrist pain 12/18/2016   . Chronic pain 02/15/2017  . DNR (do not resuscitate) 01/16/2018  . Chronic nausea 06/04/2019   Resolved Ambulatory Problems    Diagnosis Date Noted  . GERD 09/24/2010  . COUGH, CHRONIC 09/24/2010  . Diarrhea 09/24/2010  . IBD 09/27/2010  . Humeral fracture 03/31/2011  . Post-operative infection 03/31/2011  . Cystitis, acute hemorrhagic 04/14/2011  . Edema extremities   . Left shoulder pain 04/30/2014  . Right ankle pain 10/09/2014  . Elevated LFTs 03/10/2015  . Weakness 04/20/2015  . Intertrigo 08/14/2015  . Trochanteric bursitis, left hip 07/15/2016  . Primary osteoarthritis of left hip 07/28/2016  . Fracture of left wrist 12/18/2016  . Plantar wart of left foot 04/20/2017   Past Medical History:  Diagnosis Date  . Anxiety   . Arthritis   . Asthma   . C. difficile diarrhea   . Candida esophagitis ([place])   . Chronic sinusitis   . Depression   . Depression   . Diverticulosis   . Gout   . Hyperlipidemia   . Hypertension   . Osteoarthritis   . Osteopenia   . Panic disorder   . Renal failure, unspecified   . Spinal stenosis of lumbar region   . Tubular adenoma of colon   . Wrist fracture, left      Review of Systems  All other systems reviewed and are negative.      Objective:   Physical Exam Vitals signs reviewed.  Constitutional:  Appearance: Normal appearance.  HENT:     Head: Normocephalic.  Cardiovascular:     Rate and Rhythm: Normal rate and regular rhythm.     Pulses: Normal pulses.     Heart sounds: Normal heart sounds.     Comments: 93 HR on recheck.  Pulmonary:     Effort: Pulmonary effort is normal.     Breath sounds: Normal breath sounds.  Neurological:     General: No focal deficit present.     Mental Status: She is alert and oriented to person, place, and time.  Psychiatric:     Comments: Very anxious and tearful at times.      .. Depression screen [facility] 2/9 06/19/2019 12/11/2018 06/26/2018 06/26/2018 03/12/2018  Decreased  Interest 2 3 3 2 3   Down, Depressed, Hopeless 2 3 3 2 3   PHQ - 2 Score 4 6 6 4 6   Altered sleeping 1 3 1 2 3   Tired, decreased energy 2 3 3 2 3   Change in appetite 1 3 1 2 2   Feeling bad or failure about yourself  2 3 2 2 3   Trouble concentrating 1 2 0 2 3  Moving slowly or fidgety/restless 0 0 0 1 -  Suicidal thoughts 0 0 0 1 0  PHQ-9 Score 11 20 13 16 20   Difficult doing work/chores Not difficult at all Somewhat difficult Very difficult Somewhat difficult Somewhat difficult  Some recent data might be hidden   .[name] GAD 7 : Generalized Anxiety Score 06/19/2019 12/11/2018 03/12/2018 02/14/2018  Nervous, Anxious, on Edge 3 3 3 3   Control/stop worrying 2 3 3 3   Worry too much - different things 2 3 3 3   Trouble relaxing 2 3 3 3   Restless 0 3 0 1  Easily annoyed or irritable 0 2 3 2   Afraid - awful might happen 2 0 3 3  Total GAD 7 Score 11 17 18 18   Anxiety Difficulty Not difficult at all Somewhat difficult Somewhat difficult Somewhat difficult  Some encounter information is confidential and restricted. Go to Review Flowsheets activity to see all data.          Assessment & Plan:  Marland KitchenMarland KitchenLesleyanne was seen today for tachycardia.  Diagnoses and all orders for this visit:  GAD (generalized anxiety disorder)  Essential hypertension  Tachycardia   Spent some time reviewing records from Dr. Georgina Snell.  Seems like he spent multiple visits talking to her about her anxiety.  She has had a recent thyroid, CMP, CBC which were all in normal limits.  In the past she has seen cardiology.  She did have an incident where she was given a beta-blocker and her heart rate dropped tremendously.  She will never try beta-blocker again.  I offered a referral to a cardiologist and/or another Holter monitor.  Patient declined.  I am reassured that she is not having any symptoms when her heart rate is a little elevated.  Certainly this could be her anxiety and very likely.   Discussed with patient in office that  her pulse and blood pressure went way down after sitting for a little bit.  Continue to work on breathing and meditation.  I do think that moving in with her daughters could help eliminate some stress.  We discussed and decided to increase Trintellix to 10 mg.  Patient will be in the process of moving I assured her we would refill her medications until she found a primary care physician in the [place] area.  [name].Spent 30  minutes with patient and greater than 50 percent of visit spent counseling patient regarding treatment plan.

## 2019-06-19 NOTE — Patient Instructions (Signed)
Increase trintellix to 10mg  and let me know how you are doing in 4 weeks.

## 2019-06-21 ENCOUNTER — Encounter: Payer: Self-pay | Admitting: Physician Assistant

## 2019-06-24 ENCOUNTER — Telehealth: Payer: Self-pay | Admitting: Sports Medicine

## 2019-06-24 ENCOUNTER — Ambulatory Visit (INDEPENDENT_AMBULATORY_CARE_PROVIDER_SITE_OTHER): Payer: Medicare Other | Admitting: Psychology

## 2019-06-24 ENCOUNTER — Ambulatory Visit: Payer: Medicare Other | Admitting: Gastroenterology

## 2019-06-24 ENCOUNTER — Encounter: Payer: Self-pay | Admitting: Gastroenterology

## 2019-06-24 ENCOUNTER — Other Ambulatory Visit: Payer: Self-pay

## 2019-06-24 VITALS — BP 138/82 | HR 96 | Temp 98.5°F | Ht 60.0 in | Wt 163.1 lb

## 2019-06-24 DIAGNOSIS — K219 Gastro-esophageal reflux disease without esophagitis: Secondary | ICD-10-CM | POA: Diagnosis not present

## 2019-06-24 DIAGNOSIS — F419 Anxiety disorder, unspecified: Secondary | ICD-10-CM

## 2019-06-24 DIAGNOSIS — F411 Generalized anxiety disorder: Secondary | ICD-10-CM | POA: Diagnosis not present

## 2019-06-24 DIAGNOSIS — R112 Nausea with vomiting, unspecified: Secondary | ICD-10-CM | POA: Diagnosis not present

## 2019-06-24 NOTE — Telephone Encounter (Signed)
I think maybe hold off on any of that for now, and to have her see 1 of [place] to talk about replacing one of the other medications, Trintellix and Effexor would be redundant that this case, and I prefer to talk to her about switching altogether to duloxetine which has serotonin and norepinephrine reuptake inhibition as well.  We can definitely do this is a virtual visit as well that would make it easier for her. ___________________________________________ Gwen Her. Dianah Field, M.D., ABFM., CAQSM. Primary Care and Sports Medicine [facility]  Adjunct Professor of [employer]

## 2019-06-24 NOTE — Telephone Encounter (Signed)
-----   Message from [place], DO sent at 06/24/2019  2:59 PM EST ----- Hey team, Just saw this very nice lady for chronic nausea in the clinic today.  Certainly seems like her nausea is exacerbated by underlying anxiety.  She does not want upper endoscopy or imaging, which I agree would probably be of lower diagnostic utility for her.  I was thinking about starting her on low-dose Effexor 25 mg/day for possible neuromodulation benefit, but certainly want to get your take given that she is also prescribed Trintellix, Remeron, Xanax.  Looks like it has a class C warning with Trintellix and Remeron, but I am by no means an expert with crossing these types of medications.  Otherwise, discussed a trial of Reglan, but I do not think this is a gastroparesis/motility issue, and she would like to avoid given side effect profile.Thank you all in advance for your help![employer]

## 2019-06-24 NOTE — Progress Notes (Signed)
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  Nausea worse when she is nervous, and describes increasing anxiety/nervousness over the last several months.  Rare non-bloody emesis. Rare diarrhea, which only occurs on same days of emesis.   Currently taking Zofran 8 mg TID prn- takes regularly. Has trialed phenergan and Dramamine, without much improvement.  Weight largely stable.  Otherwise tolerates p.o. intake.  Symptoms are not postprandial in nature, as they can occur throughout the day.  Takes Xanax QID in addition to Remeron and Trintillex. Unsure if Xanax helps with nausea.   Takes Prilosec which controls reflux well.   Husband passed away in 03-03-19.  Recent normal liver enzymes, CBC, TFT.  No recent abdominal imaging for review.  Planning on moving to [place], [place] next month, and is looking to establish with a new GI clinic when she moves.  She presents today with her daughter.  Past Medical History:  Diagnosis Date  . Anxiety   . Arthritis    osteoarthritis  . Asthma   . C. difficile diarrhea   . Candida esophagitis ([place])   . Chronic sinusitis   . Depression   . Depression   . Diverticulosis   . DJD (degenerative joint disease)     L5 compression fracture  . Edema extremities   . Fibromyalgia   . GERD (gastroesophageal reflux disease)   . Gout   . Hyperlipidemia   . Hypertension   . Osteoarthritis   . Osteopenia   . Panic disorder   . Renal failure, unspecified   . Spinal stenosis of lumbar region   . Tubular adenoma of colon   . Wrist fracture, left      Past Surgical History:  Procedure Laterality Date  . ADENOIDECTOMY    . CARPAL TUNNEL RELEASE  2007, 2009   Bilateral  . CATARACT EXTRACTION, BILATERAL    . CHOANAL  ADENIODECTOMY    . DIAGNOSTIC LARYNGOSCOPY N/A 11/05/2015   Procedure: DIAGNOSTIC LARYNGOSCOPY AND ESOPHAGOSCOPY;  Surgeon: Rozetta Nunnery, MD;  Location: [facility];  Service: ENT;  Laterality: N/A;  . ganglion cyct removal on fingers     right  . ganglion cyst  wrist    . KNEE ARTHROSCOPY     right  . NASAL SINUS SURGERY    . OPEN REDUCTION INTERNAL FIXATION (ORIF) DISTAL RADIAL FRACTURE Left 12/18/2016   Procedure: OPEN REDUCTION INTERNAL FIXATION (ORIF) DISTAL RADIAL FRACTURE;  Surgeon: Iran Planas, MD;  Location: [facility];  Service: Orthopedics;  Laterality: Left;  . SHOULDER SURGERY  2004, 07/30/10, Mar 03, 2011   after her right humeral neck fracture, revision hemiarthroplasty 2004 for persistent pain, revision reverse arthroplasty December 2011 for persistent pain, incision and drainage with poly-exchange 01/26/2011 for possible prosthetic joint infection.  . TONSILLECTOMY AND ADENOIDECTOMY    . TOTAL ABDOMINAL HYSTERECTOMY    . TOTAL HIP ARTHROPLASTY  2004   right  . TOTAL KNEE ARTHROPLASTY  2006   right  . TOTAL SHOULDER REPLACEMENT  2002   right   Family History  Problem Relation Age of Onset  . Bladder Cancer Father   . Diabetes Mother   . Hypertension Mother   .  Breast cancer Unknown   . Colon polyps Unknown   . Depression Son    Social History   Tobacco Use  . Smoking status: Never Smoker  . Smokeless tobacco: Never Used  Substance Use Topics  . Alcohol use: No  . Drug use: No   Current Outpatient Medications  Medication Sig Dispense Refill  . albuterol (VENTOLIN HFA) 108 (90 Base) MCG/ACT inhaler Inhale 2 puffs into the lungs every 6 (six) hours as needed for wheezing or shortness of breath. 6.7 g 0  . alendronate (FOSAMAX) 70 MG tablet Take 1 tablet (70 mg total) by mouth every 7 (seven) days. Take with a full glass of water on an empty stomach. 12 tablet 0  . ALPRAZolam (XANAX) 0.5 MG tablet Take 1 tablet (0.5 mg total) by mouth 4 (four) times daily. 360 tablet 1   . azelastine (ASTELIN) 0.1 % nasal spray Place 2 sprays into both nostrils 2 (two) times daily. Use in each nostril as directed 30 mL 12  . Calcium Carbonate-Vitamin D (CALCIUM 600+D) 600-400 MG-UNIT tablet Take 1 tablet by mouth 2 (two) times daily. 90 tablet 3  . diclofenac sodium (VOLTAREN) 1 % GEL Apply 4 g topically 4 (four) times daily. To affected joint. 500 g 99  . diphenhydrAMINE (BENADRYL) 25 MG tablet Take 25 mg by mouth every 6 (six) hours as needed. For allergies    . diphenoxylate-atropine (LOMOTIL) 2.5-0.025 MG tablet One to 2 tablets by mouth 4 times a day as needed for diarrhea. 90 tablet 3  . EPINEPHrine 0.3 mg/0.3 mL IJ SOAJ injection Inject 0.3 mLs (0.3 mg total) into the muscle as needed for anaphylaxis. 1 each 1  . furosemide (LASIX) 20 MG tablet Take 1 tablet (20 mg total) by mouth daily as needed for fluid. 90 tablet 1  . metroNIDAZOLE (METROGEL) 0.75 % gel Apply 1 application topically 2 (two) times daily. 45 g 3  . mirtazapine (REMERON) 45 MG tablet Take 1 tablet (45 mg total) by mouth at bedtime. 90 tablet 3  . mupirocin ointment (BACTROBAN) 2 % Apply to affected area TID for 7 days. 30 g 3  . nystatin (MYCOSTATIN/NYSTOP) powder Apply topically 4 (four) times daily. 60 g 3  . nystatin cream (MYCOSTATIN) Apply 1 application topically 2 (two) times daily. 30 g 12  . omeprazole (PRILOSEC) 20 MG capsule TAKE 1 CAPSULE BY MOUTH  DAILY 90 capsule 3  . ondansetron (ZOFRAN-ODT) 8 MG disintegrating tablet Take 1 tablet (8 mg total) by mouth every 8 (eight) hours as needed for nausea. 20 tablet 3  . Oxycodone HCl 10 MG TABS Take 10mg  po 2-3x per day as needed for pain. 90 tablet 0  . polyethylene glycol (MIRALAX / GLYCOLAX) packet Take 17 g by mouth daily. 14 each 0  . promethazine (PHENERGAN) 25 MG tablet Take 1 tablet (25 mg total) by mouth every 6 (six) hours as needed for nausea or vomiting. 30 tablet 2  . triamcinolone cream (KENALOG) 0.5 % Apply 1 application topically 2  (two) times daily. To affected areas. 30 g 3  . triamterene-hydrochlorothiazide (MAXZIDE-25) 37.5-25 MG tablet TAKE 1 TABLET BY MOUTH  EVERY DAY 90 tablet 3  . TRINTELLIX 5 MG TABS tablet Take 1 tablet (5 mg total) by mouth daily. 90 tablet 3   No current facility-administered medications for this visit.    Allergies  Allergen Reactions  . Bee Venom Anaphylaxis    Has epi-pen  . Ceftin [Cefuroxime Axetil] Anaphylaxis  . Ciprofloxacin  Anaphylaxis and Palpitations  . Ephedrine Other (See Comments) and Palpitations    "knocks me out"  . Sulfonamide Derivatives Anaphylaxis  . Telithromycin Anaphylaxis    Reaction: also blurred vision   . Valsartan Anaphylaxis  . Verapamil Anaphylaxis  . Venlafaxine Other (See Comments)    Insomnia, panic  . Beta Adrenergic Blockers     bradycardia  . Cephalosporins     Allergic Reaction  . Clindamycin/Lincomycin     Dry skin and itching/ Cdiff  . Cymbalta [Duloxetine Hcl] Other (See Comments)    Reaction:Confusion and " I fall down"  . Doxazosin     "blurred vision and irritable"  . Hydralazine     HA, Diarrhea  . Lamotrigine     Patient unsure at this time  . Morphine Other (See Comments)    Medication has no effect with pain  . Pregabalin Swelling  . Prochlorperazine Edisylate   . Pseudoephedrine Other (See Comments)    Reaction: hyperventilates and blacks out  . Relafen [Nabumetone] Swelling  . Zoloft [Sertraline] Other (See Comments)    "nervous/jittery"  . Doxycycline Hives  . Wellbutrin [Bupropion] Other (See Comments)    Jittery     Review of Systems: All systems reviewed and negative except where noted in HPI.     Physical Exam:    Wt Readings from Last 3 Encounters:  06/19/19 161 lb (73 kg)  06/04/19 162 lb (73.5 kg)  05/28/19 163 lb (73.9 kg)    There were no vitals taken for this visit. Constitutional:  Pleasant, in no acute distress. Psychiatric: Normal mood and affect. Behavior is normal. EENT: Pupils normal.   Conjunctivae are normal. No scleral icterus. Neck supple. No cervical LAD. Cardiovascular: Normal rate, regular rhythm. No edema Pulmonary/chest: Effort normal and breath sounds normal. No wheezing, rales or rhonchi. Abdominal: Soft, nondistended, nontender. Bowel sounds active throughout. There are no masses palpable. No hepatomegaly. Neurological: Alert and oriented to person place and time. Skin: Skin is warm and dry. No rashes noted.   ASSESSMENT AND PLAN;   1) Nausea/Vomiting Chronic nausea with rare emesis for the last year or so, exacerbated by times of anxiety.  Nausea is now essentially daily, and worsen when she is anxious/nervous.  Clinical presentation certainly seems most consistent with neuropsych etiology.  Discussed further diagnostic options, to include EGD (declined), cross-sectional imaging (declined), trial of Reglan (declined due to ADR profile).  Also discussed starting on Effexor for neuromodulation benefit.  Given that she is also prescribed Trintellix, Remeron, and Xanax, will discuss with her primary care team before prescribing. In the meantime, continue Zofran as prescribed.  Happy to see in follow-up here, or assist with her transfer to a new GI after she moved to [place], [place] next month.  2) GERD: Well-controlled on current acid suppression therapy.  No changes needed at this time -Resume Prilosec  3) General Anxiety Disorder: -Follows closely in the [facility] clinic and on multiple medications -Strong suspicion for overlap as above  Lavena Bullion, DO, FACG  06/24/2019, 2:04 PM   Gregor Hams, MD

## 2019-06-24 NOTE — Patient Instructions (Signed)
If you are age 83 or older, your body mass index should be between 23-30. Your Body mass index is 31.86 kg/m. If this is out of the aforementioned range listed, please consider follow up with your Primary Care Provider.  If you are age 32 or younger, your body mass index should be between 19-25. Your Body mass index is 31.86 kg/m. If this is out of the aformentioned range listed, please consider follow up with your Primary Care Provider.   It was a pleasure to see you today!  Vito Cirigliano, D.O.

## 2019-06-25 ENCOUNTER — Telehealth: Payer: Self-pay

## 2019-06-25 ENCOUNTER — Ambulatory Visit (INDEPENDENT_AMBULATORY_CARE_PROVIDER_SITE_OTHER): Payer: Medicare Other | Admitting: Sports Medicine

## 2019-06-25 ENCOUNTER — Telehealth: Payer: Self-pay | Admitting: Gastroenterology

## 2019-06-25 DIAGNOSIS — F331 Major depressive disorder, recurrent, moderate: Secondary | ICD-10-CM

## 2019-06-25 MED ORDER — VORTIOXETINE HBR 10 MG PO TABS: 10.0000 mg | ORAL_TABLET | Freq: Every day | ORAL | 3 refills | Status: DC

## 2019-06-25 NOTE — Telephone Encounter (Signed)
Called and spoke with patient-patient informed of MD recommendations; patient is agreeable with plan and will call Dr. Mcneil Sober office and make an appt with him; Patient verbalized understanding of information/instructions;  Patient was advised to call the office at [phone] if questions/concerns arise;

## 2019-06-25 NOTE — Assessment & Plan Note (Signed)
Increasing to 10 mg daily, she can do this for 4 to 6 weeks before switching to 20 mg, we did have to fill out the help at hand form, this can be downloaded from the media tab for new medications or for the 20 mg form of Trintellix, the prescription page which is page 4 of 20 and the prescription itself just need to be faxed to [phone].  The hope is that this will improve her GI symptoms as well.

## 2019-06-25 NOTE — Telephone Encounter (Signed)
Spoke with [name] about plan for medications. Had discussed possibility of changing to SNRI (or TCA) for neuromodulation benefit. However, after discussion with her Primary Care team, as the Trintellix was just increased to 10 mg/day last week, plan is to continue Trintellix for now, and can also consider increasing to 20 mg/day in the future if needed, rather than changing to alternate medication at this early juncture.  Additionally, as she is planning on moving in the next month or so, would not want to make too many medication changes until she has firmly established care with Primary Care and GI in her new location.  I discussed all of this in detail with [name] today, and she understands and agrees with this plan of care.  She was very thankful for the coordinated care and for the call back.  Will continue to coordinate dosing of Trintellix and follow-up through [facility] clinic.  All questions answered.

## 2019-06-25 NOTE — Progress Notes (Signed)
Virtual Visit via Telephone   I connected with  [name]  on 06/25/19 by telephone/telehealth and verified that I am speaking with the correct person using two identifiers.   I discussed the limitations, risks, security and privacy concerns of performing an evaluation and management service by telephone, including the higher likelihood of inaccurate diagnosis and treatment, and the availability of in person appointments.  We also discussed the likely need of an additional face to face encounter for complete and high quality delivery of care.  I also discussed with the patient that there may be a patient responsible charge related to this service. The patient expressed understanding and wishes to proceed.  Provider location is either at home or medical facility. Patient location is at their home, different from provider location. People involved in care of the patient during this telehealth encounter were myself, my nurse/medical assistant, and my front office/scheduling team member.  Subjective:    CC: Mood disorder  HPI: [name] has anxiety and depression, this has been moderately controlled with 5 mg of Trintellix, she gets this through medication assistance program due to low income.  She has been seeing gastroenterology, we did have a discussion regarding using SSRIs and SNRIs for her GI symptoms.  Ultimately she is had multiple intolerances and desires to stick with Trintellix, Trintellix is an SSRI but it also has direct serotonin agonism.  She is almost running out of her 5 mg pills, she has been doing 10 total milligrams but not for long.  I reviewed the past medical history, family history, social history, surgical history, and allergies today and no changes were needed.  Please see the problem list section below in epic for further details.  Past Medical History: Past Medical History:  Diagnosis Date  . Anxiety   . Arthritis    osteoarthritis  . Asthma   . C. difficile  diarrhea   . Candida esophagitis ([place])   . Chronic sinusitis   . Depression   . Depression   . Diverticulosis   . DJD (degenerative joint disease)     L5 compression fracture  . Edema extremities   . Fibromyalgia   . GERD (gastroesophageal reflux disease)   . Gout   . Hyperlipidemia   . Hypertension   . Osteoarthritis   . Osteopenia   . Panic disorder   . Renal failure, unspecified   . Spinal stenosis of lumbar region   . Tubular adenoma of colon   . Wrist fracture, left    Past Surgical History: Past Surgical History:  Procedure Laterality Date  . ADENOIDECTOMY    . CARPAL TUNNEL RELEASE  2007, 2009   Bilateral  . CATARACT EXTRACTION, BILATERAL    . CHOANAL ADENIODECTOMY    . DIAGNOSTIC LARYNGOSCOPY N/A 11/05/2015   Procedure: DIAGNOSTIC LARYNGOSCOPY AND ESOPHAGOSCOPY;  Surgeon: Rozetta Nunnery, MD;  Location: [facility];  Service: ENT;  Laterality: N/A;  . ganglion cyct removal on fingers     right  . ganglion cyst  wrist    . KNEE ARTHROSCOPY     right  . NASAL SINUS SURGERY    . OPEN REDUCTION INTERNAL FIXATION (ORIF) DISTAL RADIAL FRACTURE Left 12/18/2016   Procedure: OPEN REDUCTION INTERNAL FIXATION (ORIF) DISTAL RADIAL FRACTURE;  Surgeon: Iran Planas, MD;  Location: [facility];  Service: Orthopedics;  Laterality: Left;  . SHOULDER SURGERY  2004, 07/30/10, 01/2011   after her right humeral neck fracture, revision hemiarthroplasty 2004 for persistent pain, revision reverse arthroplasty  December 2011 for persistent pain, incision and drainage with poly-exchange 01/26/2011 for possible prosthetic joint infection.  . TONSILLECTOMY AND ADENOIDECTOMY    . TOTAL ABDOMINAL HYSTERECTOMY    . TOTAL HIP ARTHROPLASTY  2004   right  . TOTAL KNEE ARTHROPLASTY  2006   right  . TOTAL SHOULDER REPLACEMENT  2002   right   Social History: Social History   Socioeconomic History  . Marital status: Widowed    Spouse name: Not on file  . Number of children: Not on file  . Years of  education: Not on file  . Highest education level: Not on file  Occupational History  . Not on file  Social Needs  . Financial resource strain: Not on file  . Food insecurity    Worry: Not on file    Inability: Not on file  . Transportation needs    Medical: Not on file    Non-medical: Not on file  Tobacco Use  . Smoking status: Never Smoker  . Smokeless tobacco: Never Used  Substance and Sexual Activity  . Alcohol use: No  . Drug use: No  . Sexual activity: Never  Lifestyle  . Physical activity    Days per week: Not on file    Minutes per session: Not on file  . Stress: Not on file  Relationships  . Social Herbalist on phone: Not on file    Gets together: Not on file    Attends religious service: Not on file    Active member of club or organization: Not on file    Attends meetings of clubs or organizations: Not on file    Relationship status: Not on file  Other Topics Concern  . Not on file  Social History Narrative  . Not on file   Family History: Family History  Problem Relation Age of Onset  . Bladder Cancer Father   . Diabetes Mother   . Hypertension Mother   . Colon polyps Mother   . Breast cancer Other   . Colon polyps Other   . Depression Son   . Colon cancer Neg Hx   . Esophageal cancer Neg Hx    Allergies: Allergies  Allergen Reactions  . Bee Venom Anaphylaxis    Has epi-pen  . Ceftin [Cefuroxime Axetil] Anaphylaxis  . Ciprofloxacin Anaphylaxis and Palpitations  . Ephedrine Other (See Comments) and Palpitations    "knocks me out"  . Sulfonamide Derivatives Anaphylaxis  . Telithromycin Anaphylaxis    Reaction: also blurred vision   . Valsartan Anaphylaxis  . Verapamil Anaphylaxis  . Venlafaxine Other (See Comments)    Insomnia, panic  . Beta Adrenergic Blockers     bradycardia  . Cephalosporins     Allergic Reaction  . Clindamycin/Lincomycin     Dry skin and itching/ Cdiff  . Cymbalta [Duloxetine Hcl] Other (See Comments)     Reaction:Confusion and " I fall down"  . Doxazosin     "blurred vision and irritable"  . Hydralazine     HA, Diarrhea  . Lamotrigine     Patient unsure at this time  . Morphine Other (See Comments)    Medication has no effect with pain  . Pregabalin Swelling  . Prochlorperazine Edisylate   . Pseudoephedrine Other (See Comments)    Reaction: hyperventilates and blacks out  . Relafen [Nabumetone] Swelling  . Zoloft [Sertraline] Other (See Comments)    "nervous/jittery"  . Doxycycline Hives  . Wellbutrin [Bupropion] Other (See Comments)  Jittery   Medications: See med rec.  Review of Systems: No fevers, chills, night sweats, weight loss, chest pain, or shortness of breath.   Objective:    General: Speaking full sentences, no audible heavy breathing.  Sounds alert and appropriately interactive.  No other physical exam performed due to the non-face to face nature of this visit.  Impression and Recommendations:    MDD (major depressive disorder) Increasing to 10 mg daily, she can do this for 4 to 6 weeks before switching to 20 mg, we did have to fill out the help at hand form, this can be downloaded from the media tab for new medications or for the 20 mg form of Trintellix, the prescription page which is page 4 of 20 and the prescription itself just need to be faxed to [phone].  The hope is that this will improve her GI symptoms as well.  I discussed the above assessment and treatment plan with the patient. The patient was provided an opportunity to ask questions and all were answered. The patient agreed with the plan and demonstrated an understanding of the instructions.   The patient was advised to call back or seek an in-person evaluation if the symptoms worsen or if the condition fails to improve as anticipated.   I provided 25 minutes of non-face-to-face time during this encounter, 15 minutes of additional time was needed to gather information, review chart, records,  communicate/coordinate with staff remotely, and complete documentation.   ___________________________________________ Gwen Her. Dianah Field, M.D., ABFM., CAQSM. Primary Care and Sports Medicine [facility]  Adjunct Professor of [employer] [facility] [employer] of Medicine

## 2019-06-25 NOTE — Telephone Encounter (Signed)
-----   Message from Mason, DO sent at 06/24/2019  5:05 PM EST ----- Can you please update this patient that I was able to discuss the medication adjustment with Dr. Dianah Field at [name], and his suggestion and plan is to make a virtual appointment with her to discuss any modifications in detail.  Will likely plan on modifying meds to some degree, but no new prescription from me at this time, pending an appointment (virtual okay with him) with him.  Thanks. ----- Message ----- From: Silverio Decamp, MD Sent: 06/24/2019   3:08 PM EST To: Gregor Hams, MD, Donella Stade, PA-C, #  I think maybe hold off on any of that for now, and to have her see 1 of [place] to talk about replacing one of the other medications, Trintellix and Effexor would be redundant that this case, and I prefer to talk to her about switching altogether to duloxetine which has serotonin and norepinephrine reuptake inhibition as well.  We can definitely do this is a virtual visit as well that would make it easier for her. ___________________________________________Thomas J. Dianah Field, M.D., ABFM., CAQSM.Primary Care and Sports MedicineC[facility] MedCenter KernersvilleAdjunct Professor of [place] [employer] [facility] of Medicine ----- Message ----- From: Lavena Bullion, DO Sent: 06/24/2019   2:59 PM EST To: Silverio Decamp, MD, Gregor Hams, MD, #  Hey team, Just saw this very nice lady for chronic nausea in the clinic today.  Certainly seems like her nausea is exacerbated by underlying anxiety.  She does not want upper endoscopy or imaging, which I agree would probably be of lower diagnostic utility for her.  I was thinking about starting her on low-dose Effexor 25 mg/day for possible neuromodulation benefit, but certainly want to get your take given that she is also prescribed Trintellix, Remeron, Xanax.  Looks like it has a class C warning with Trintellix and Remeron,  but I am by no means an expert with crossing these types of medications.  Otherwise, discussed a trial of Reglan, but I do not think this is a gastroparesis/motility issue, and she would like to avoid given side effect profile.Thank you all in advance for your help![employer]

## 2019-07-02 ENCOUNTER — Other Ambulatory Visit: Payer: Self-pay | Admitting: *Deleted

## 2019-07-02 DIAGNOSIS — F331 Major depressive disorder, recurrent, moderate: Secondary | ICD-10-CM

## 2019-07-02 MED ORDER — VORTIOXETINE HBR 10 MG PO TABS: 10.0000 mg | ORAL_TABLET | Freq: Every day | ORAL | 3 refills | Status: AC

## 2019-07-04 ENCOUNTER — Telehealth: Payer: Self-pay | Admitting: Sports Medicine

## 2019-07-04 NOTE — Telephone Encounter (Signed)
Patient called and had spoken with Bernita Buffy and was not able to get the order from 07/02/2019. A representative Renee called and advised me that the order was found and they were processing this for [name] at this time. They are going to call the patient with the information about her medication. I have left a brief message for this patient that Bernita Buffy has found her information and will call the patient to set up a shipment.

## 2019-07-05 ENCOUNTER — Ambulatory Visit (INDEPENDENT_AMBULATORY_CARE_PROVIDER_SITE_OTHER): Payer: Medicare Other | Admitting: Psychology

## 2019-07-05 DIAGNOSIS — F419 Anxiety disorder, unspecified: Secondary | ICD-10-CM

## 2019-07-05 DIAGNOSIS — F339 Major depressive disorder, recurrent, unspecified: Secondary | ICD-10-CM | POA: Diagnosis not present

## 2019-07-10 NOTE — Telephone Encounter (Signed)
Patient did not call back with any questions.

## 2019-07-11 MED ORDER — EPINEPHRINE 0.3 MG/0.3ML IJ SOAJ: 0.3000 mg | INTRAMUSCULAR | 2 refills | Status: AC | PRN

## 2019-07-11 NOTE — Addendum Note (Signed)
Addended by: Dessie Coma on: 07/11/2019 10:38 AM   Modules accepted: Orders

## 2019-07-15 ENCOUNTER — Ambulatory Visit (INDEPENDENT_AMBULATORY_CARE_PROVIDER_SITE_OTHER): Payer: Medicare Other | Admitting: Psychology

## 2019-07-15 DIAGNOSIS — F419 Anxiety disorder, unspecified: Secondary | ICD-10-CM

## 2019-07-15 DIAGNOSIS — F339 Major depressive disorder, recurrent, unspecified: Secondary | ICD-10-CM

## 2019-07-17 ENCOUNTER — Encounter: Payer: Self-pay | Admitting: Physician Assistant

## 2019-07-17 ENCOUNTER — Ambulatory Visit (INDEPENDENT_AMBULATORY_CARE_PROVIDER_SITE_OTHER): Payer: Medicare Other | Admitting: Physician Assistant

## 2019-07-17 VITALS — Ht 60.0 in | Wt 158.0 lb

## 2019-07-17 DIAGNOSIS — M797 Fibromyalgia: Secondary | ICD-10-CM | POA: Diagnosis not present

## 2019-07-17 DIAGNOSIS — M069 Rheumatoid arthritis, unspecified: Secondary | ICD-10-CM | POA: Diagnosis not present

## 2019-07-17 DIAGNOSIS — G894 Chronic pain syndrome: Secondary | ICD-10-CM | POA: Diagnosis not present

## 2019-07-17 DIAGNOSIS — F411 Generalized anxiety disorder: Secondary | ICD-10-CM

## 2019-07-17 DIAGNOSIS — M1712 Unilateral primary osteoarthritis, left knee: Secondary | ICD-10-CM

## 2019-07-17 DIAGNOSIS — M810 Age-related osteoporosis without current pathological fracture: Secondary | ICD-10-CM

## 2019-07-17 MED ORDER — OXYCODONE HCL 10 MG PO TABS: ORAL_TABLET | ORAL | 0 refills | Status: AC

## 2019-07-17 NOTE — Progress Notes (Signed)
Needs refill Oxycodone PHQ9-GAD7 completed.  Patient just moved and has some stress with that

## 2019-07-17 NOTE — Progress Notes (Signed)
Patient ID: [name], female   DOB: 01-01-1933, 85 y.o.   MRN: [id number] .Marland KitchenVirtual Visit via Telephone Note  I connected with [name] on 07/17/19 at 11:10 AM EST by telephone and verified that I am speaking with the correct person using two identifiers.  Location: Patient: home Provider: clinic   I discussed the limitations, risks, security and privacy concerns of performing an evaluation and management service by telephone and the availability of in person appointments. I also discussed with the patient that there may be a patient responsible charge related to this service. The patient expressed understanding and agreed to proceed.   History of Present Illness: Patient is an 85 year old female with chronic rheumatoid arthritis, chronic pain syndrome, osteoarthritis, major depression, generalized anxiety who presents to the clinic for follow-up.  Patient was previously established with Dr. Georgina [name].  He was prescribing her pain medication.  She has since moved to [facility] and has another primary care appointment scheduled for January but needs a bridge until she can make it into that appointment.  She is taking oxycodone 2-3 times a day.  She has been controlled on this regimen for quite some time.  She is having trouble with her new ZIP Code getting her mail.  There is a PO Box but it is too hard for her to get to.  Her pain and arthritic conditions prevent her from safely being able to walk to the PO Box.  She needs accommodations for the mail to be dropped off at her door.  She requests I write a letter.  Patient's new address is [address] [place].  She requests email be sent to [name]  Overall her mood is doing very well.  She feels much more peaceful living with her daughters.  She denies any significant depression or anxiety.  She is on Trintellix 10 mg and feels like it works really well.  Patient denies any suicidal thoughts or homicidal  idealizations   .[name] Active Ambulatory Problems    Diagnosis Date Noted  . HTN (hypertension) 03/31/2011  . Dyslipidemia 03/31/2011  . MDD (major depressive disorder) 03/31/2011  . Fibromyalgia 03/31/2011  . DJD (degenerative joint disease) 03/31/2011  . GERD (gastroesophageal reflux disease)   . Bradycardia 06/03/2013  . Chronic rheumatic arthritis ([place]) 02/19/2014  . GAD (generalized anxiety disorder) 02/19/2014  . Osteoporosis 03/12/2014  . Personal history of colonic polyps 03/12/2014  . Constipation 05/23/2014  . Primary osteoarthritis of left knee 01/01/2015  . Seasonal allergies 02/24/2015  . Prediabetes 02/24/2016  . Chronic kidney disease (CKD), stage III (moderate) 02/24/2016  . History of total right hip arthroplasty 06/17/2016  . Status post total shoulder arthroplasty, right 11/11/2016  . S/P total knee arthroplasty, right 11/11/2016  . Lumbago 12/08/2016  . Left wrist pain 12/18/2016  . Chronic pain 02/15/2017  . DNR (do not resuscitate) 01/16/2018  . Chronic nausea 06/04/2019  . Tachycardia 06/19/2019   Resolved Ambulatory Problems    Diagnosis Date Noted  . GERD 09/24/2010  . COUGH, CHRONIC 09/24/2010  . Diarrhea 09/24/2010  . IBD 09/27/2010  . Humeral fracture 03/31/2011  . Post-operative infection 03/31/2011  . Cystitis, acute hemorrhagic 04/14/2011  . Edema extremities   . Left shoulder pain 04/30/2014  . Right ankle pain 10/09/2014  . Elevated LFTs 03/10/2015  . Weakness 04/20/2015  . Intertrigo 08/14/2015  . Trochanteric bursitis, left hip 07/15/2016  . Primary osteoarthritis of left hip 07/28/2016  . Fracture of left wrist 12/18/2016  .  Plantar wart of left foot 04/20/2017   Past Medical History:  Diagnosis Date  . Anxiety   . Arthritis   . Asthma   . C. difficile diarrhea   . Candida esophagitis ([place])   . Chronic sinusitis   . Depression   . Depression   . Diverticulosis   . Gout   . Hyperlipidemia   . Hypertension   .  Osteoarthritis   . Osteopenia   . Panic disorder   . Renal failure, unspecified   . Spinal stenosis of lumbar region   . Tubular adenoma of colon   . Wrist fracture, left    Reviewed med, allergy, problem list.   Observations/Objective: No acute distress. Normal mood.   .. Today's Vitals   07/17/19 0939  Weight: 158 lb (71.7 kg)  Height: 5' (1.524 m)   Body mass index is 30.86 kg/m.  .. Depression screen [facility] 2/9 07/17/2019 06/19/2019 12/11/2018 06/26/2018 06/26/2018  Decreased Interest 0 2 3 3 2   Down, Depressed, Hopeless 1 2 3 3 2   PHQ - 2 Score 1 4 6 6 4   Altered sleeping 0 1 3 1 2   Tired, decreased energy 0 2 3 3 2   Change in appetite 0 1 3 1 2   Feeling bad or failure about yourself  0 2 3 2 2   Trouble concentrating 0 1 2 0 2  Moving slowly or fidgety/restless 0 0 0 0 1  Suicidal thoughts 0 0 0 0 1  PHQ-9 Score 1 11 20 13 16   Difficult doing work/chores Not difficult at all Not difficult at all Somewhat difficult Very difficult Somewhat difficult  Some recent data might be hidden   .[name] GAD 7 : Generalized Anxiety Score 07/17/2019 06/19/2019 12/11/2018 03/12/2018  Nervous, Anxious, on Edge 1 3 3 3   Control/stop worrying 0 2 3 3   Worry too much - different things 1 2 3 3   Trouble relaxing 0 2 3 3   Restless 0 0 3 0  Easily annoyed or irritable 1 0 2 3  Afraid - awful might happen 0 2 0 3  Total GAD 7 Score 3 11 17 18   Anxiety Difficulty Not difficult at all Not difficult at all Somewhat difficult Somewhat difficult  Some encounter information is confidential and restricted. Go to Review Flowsheets activity to see all data.       Assessment and Plan:  Marland KitchenMarland KitchenSenia was seen today for anxiety.  Diagnoses and all orders for this visit:  Chronic pain syndrome -     Oxycodone HCl 10 MG TABS; Take 10mg  po 2-3x per day as needed for pain.  Rheumatoid arthritis of hand, unspecified laterality, unspecified whether rheumatoid factor present (HCC)  Primary  osteoarthritis of left knee  Age-related osteoporosis without current pathological fracture  GAD (generalized anxiety disorder)  Fibromyalgia   .Marland KitchenPDMP reviewed during this encounter. Refilled for one month until she can establish with PCP in [place].  No concerns.   Continue on trintellix 10mg  she is doing really well at this dose. PHQ and GAD have improved significantly.   Note written for post office to deliver mail to door due to her chronic pain and arthritic conditions. Will e-mail to patient.      Follow Up Instructions:    I discussed the assessment and treatment plan with the patient. The patient was provided an opportunity to ask questions and all were answered. The patient agreed with the plan and demonstrated an understanding of the instructions.   The patient was  advised to call back or seek an in-person evaluation if the symptoms worsen or if the condition fails to improve as anticipated.  I provided 20 minutes of non-face-to-face time during this encounter.   Iran Planas, PA-C

## 2019-07-19 ENCOUNTER — Ambulatory Visit: Payer: Medicare Other | Admitting: Psychology

## 2019-07-21 ENCOUNTER — Encounter: Payer: Self-pay | Admitting: Physician Assistant

## 2019-08-16 ENCOUNTER — Telehealth: Payer: Self-pay

## 2019-08-16 NOTE — Telephone Encounter (Signed)
[name] left a vm msg on behalf of pt. She stated that pt was seen by provider on 06/04/19 for left knee pain. At time of visit pt had decided to postpone plan of care since she was moving. [name] mentioned pt has establish care with a new provider and was referred to [name] at [facility] in [place]. She is requesting a copy of the last X-ray results and recommendations by provider to be faxed to [phone], attn: Corene Cornea. Pls advise, thanks.

## 2019-08-16 NOTE — Telephone Encounter (Signed)
No problem, please either print and fax the x-ray results and my last notes or right click them and forward/route them via Epic fax to who needs them.

## 2019-08-19 NOTE — Telephone Encounter (Signed)
Faxed information requested. - CF

## 2019-08-19 NOTE — Telephone Encounter (Signed)
Forwarding to referral coordinator for assistance. Thanks.

## 2019-08-20 NOTE — Telephone Encounter (Signed)
Patient called back and needs a copy of the recent x-rays sent to Dr. Mamie Laurel. I have re-faxed them to [facility] for this patient. No other questions.

## 2019-12-06 IMAGING — DX DG WRIST COMPLETE 3+V*L*
4 series · 4 of 4 positions shown · non-contrast
Comparison: 12/17/2016

CLINICAL DATA: Chronic pain since wrist fracture in 3706

EXAM:
LEFT WRIST - COMPLETE 3+ VIEW

[wrist pa]
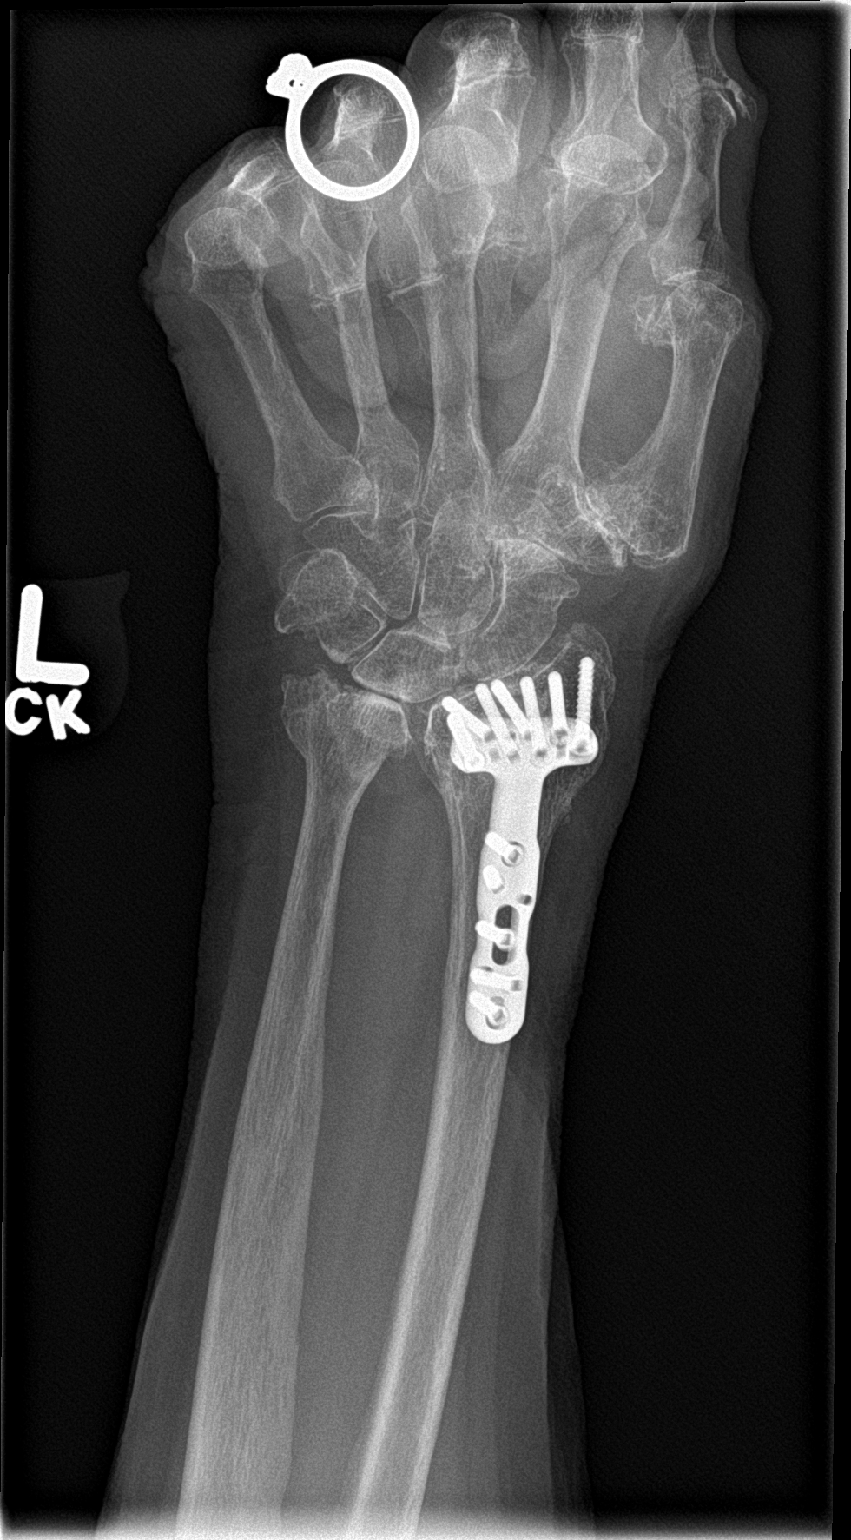

[wrist obl]
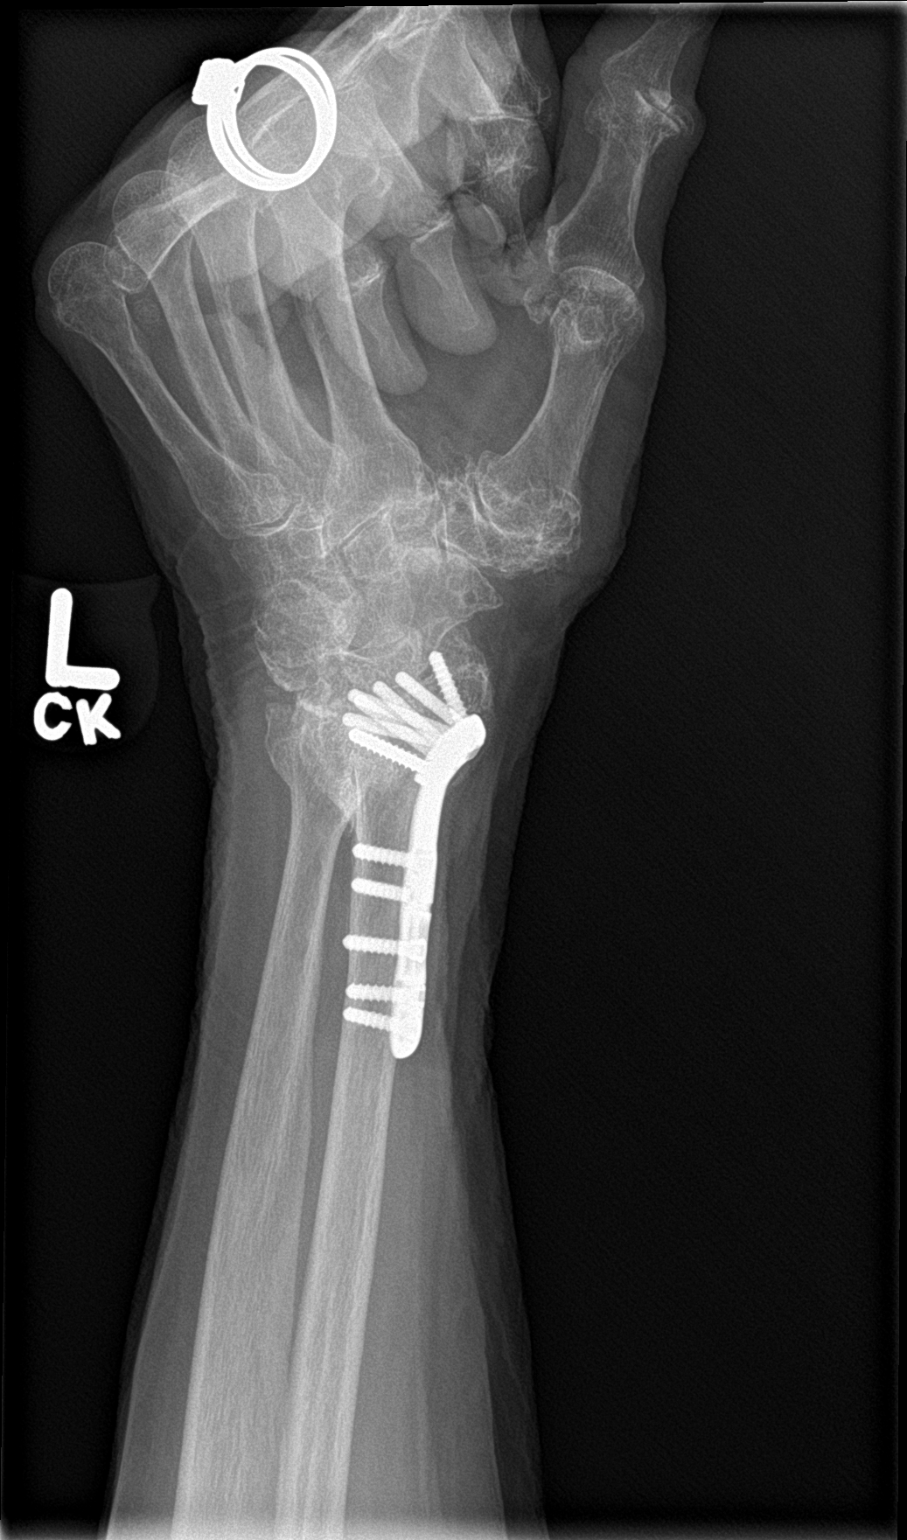

[wrist lat]
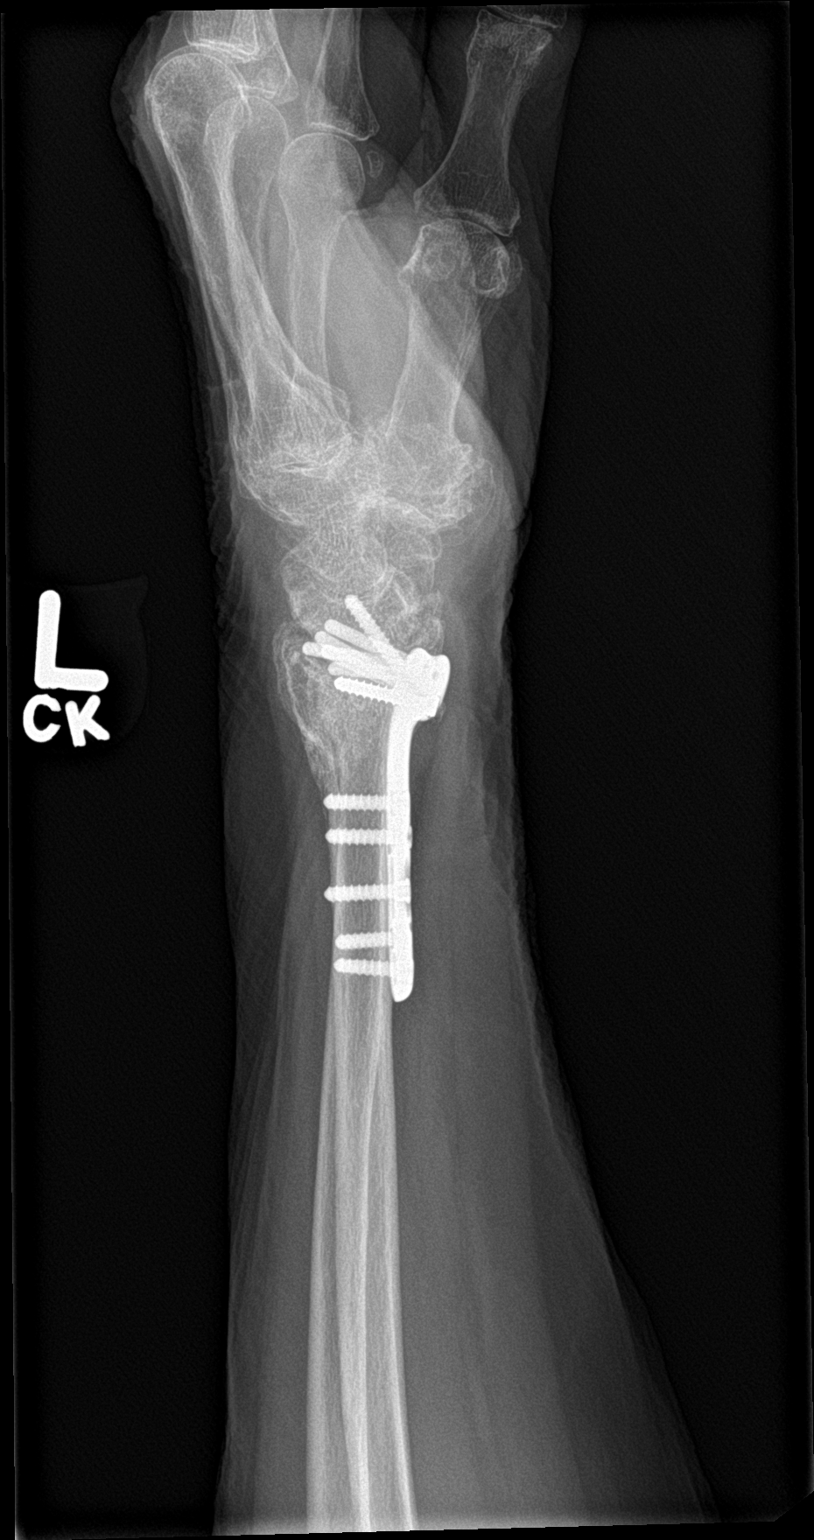

[wrist navicular]
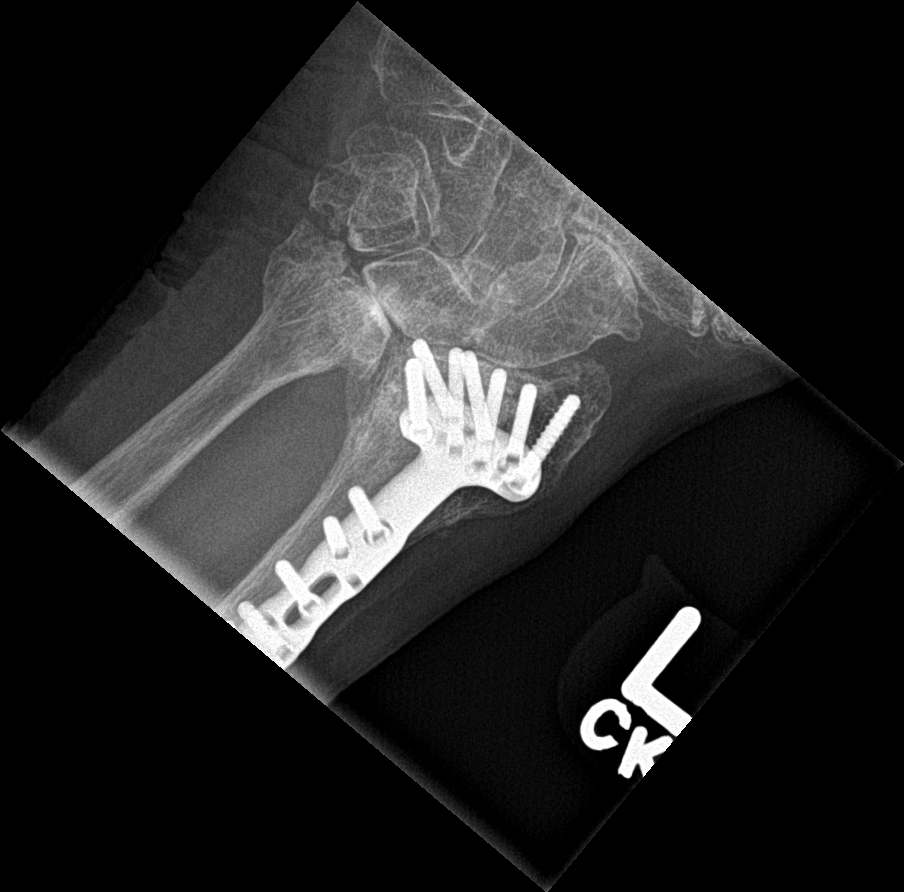

[4 of 4 positions shown; findings below may reference images not displayed]

FINDINGS: There has been interval sideplate and screw fixation of distal
radial fracture with normal alignment and healing at the fracture
site.

Posttraumatic deformity of the ulnar metaphysis. There is a complete
effacement of the radiocarpal and distal radioulnar joints with
subchondral sclerosis and remodeling.

Prominent osteoarthritic changes at the first carpometacarpal joint
and intercarpal joints.
IMPRESSION: Status post sideplate and screw fixation of distal radial fracture
with normal alignment and healing.

Posttraumatic deformity from healed ulnar metaphysis fracture.

Secondary osteoarthritic changes at the radiocarpal and distal
radioulnar joints.

## 2019-12-17 ENCOUNTER — Encounter: Payer: Self-pay | Admitting: Family Medicine

## 2021-04-09 ENCOUNTER — Other Ambulatory Visit: Payer: Self-pay | Admitting: Family Medicine

## 2023-04-02 DEATH — deceased
